# Patient Record
Sex: Male | Born: 1945 | Race: White | Hispanic: No | Marital: Married | Smoking: Current every day smoker
Health system: Southern US, Community
[De-identification: ages and names within clinical notes are randomized; demographics above are authoritative.]

## PROBLEM LIST (undated history)

## (undated) DIAGNOSIS — N179 Acute kidney failure, unspecified: Secondary | ICD-10-CM

## (undated) DIAGNOSIS — L570 Actinic keratosis: Secondary | ICD-10-CM

## (undated) DIAGNOSIS — Z8616 Personal history of COVID-19: Secondary | ICD-10-CM

## (undated) DIAGNOSIS — R7303 Prediabetes: Secondary | ICD-10-CM

## (undated) DIAGNOSIS — J449 Chronic obstructive pulmonary disease, unspecified: Secondary | ICD-10-CM

## (undated) DIAGNOSIS — F0390 Unspecified dementia without behavioral disturbance: Secondary | ICD-10-CM

## (undated) DIAGNOSIS — M5030 Other cervical disc degeneration, unspecified cervical region: Secondary | ICD-10-CM

## (undated) DIAGNOSIS — I10 Essential (primary) hypertension: Secondary | ICD-10-CM

## (undated) DIAGNOSIS — F32A Depression, unspecified: Secondary | ICD-10-CM

## (undated) DIAGNOSIS — F329 Major depressive disorder, single episode, unspecified: Secondary | ICD-10-CM

## (undated) DIAGNOSIS — M1990 Unspecified osteoarthritis, unspecified site: Secondary | ICD-10-CM

## (undated) HISTORY — DX: Acute kidney failure, unspecified: N17.9

## (undated) HISTORY — PX: TOE AMPUTATION: SHX809

## (undated) HISTORY — DX: Personal history of COVID-19: Z86.16

## (undated) HISTORY — PX: BACK SURGERY: SHX140

## (undated) HISTORY — DX: Actinic keratosis: L57.0

## (undated) HISTORY — DX: Unspecified dementia, unspecified severity, without behavioral disturbance, psychotic disturbance, mood disturbance, and anxiety: F03.90

---

## 1998-08-26 ENCOUNTER — Encounter: Payer: Self-pay | Admitting: Neurosurgery

## 1998-08-26 ENCOUNTER — Ambulatory Visit (HOSPITAL_COMMUNITY): Admission: RE | Admit: 1998-08-26 | Discharge: 1998-08-26 | Payer: Self-pay | Admitting: Neurosurgery

## 1998-09-09 ENCOUNTER — Encounter: Payer: Self-pay | Admitting: Neurosurgery

## 1998-09-09 ENCOUNTER — Ambulatory Visit (HOSPITAL_COMMUNITY): Admission: RE | Admit: 1998-09-09 | Discharge: 1998-09-09 | Payer: Self-pay | Admitting: Neurosurgery

## 1998-09-23 ENCOUNTER — Ambulatory Visit (HOSPITAL_COMMUNITY): Admission: RE | Admit: 1998-09-23 | Discharge: 1998-09-23 | Payer: Self-pay | Admitting: Neurosurgery

## 1998-09-23 ENCOUNTER — Encounter: Payer: Self-pay | Admitting: Neurosurgery

## 1998-10-29 ENCOUNTER — Ambulatory Visit (HOSPITAL_COMMUNITY): Admission: RE | Admit: 1998-10-29 | Discharge: 1998-10-29 | Payer: Self-pay | Admitting: Neurosurgery

## 1998-10-29 ENCOUNTER — Encounter: Payer: Self-pay | Admitting: Neurosurgery

## 2004-03-26 ENCOUNTER — Ambulatory Visit: Payer: Self-pay | Admitting: Physician Assistant

## 2004-04-14 ENCOUNTER — Ambulatory Visit: Payer: Self-pay | Admitting: Pain Medicine

## 2004-05-27 ENCOUNTER — Ambulatory Visit: Payer: Self-pay | Admitting: Physician Assistant

## 2004-06-19 ENCOUNTER — Ambulatory Visit: Payer: Self-pay | Admitting: Pain Medicine

## 2004-07-03 ENCOUNTER — Ambulatory Visit: Payer: Self-pay | Admitting: Physician Assistant

## 2004-07-28 ENCOUNTER — Ambulatory Visit: Payer: Self-pay | Admitting: Physician Assistant

## 2004-08-26 ENCOUNTER — Ambulatory Visit: Payer: Self-pay | Admitting: Physician Assistant

## 2004-09-30 ENCOUNTER — Ambulatory Visit: Payer: Self-pay | Admitting: Physician Assistant

## 2004-10-07 ENCOUNTER — Ambulatory Visit: Payer: Self-pay | Admitting: Pain Medicine

## 2004-11-10 ENCOUNTER — Ambulatory Visit: Payer: Self-pay | Admitting: Physician Assistant

## 2004-11-27 ENCOUNTER — Ambulatory Visit: Payer: Self-pay | Admitting: Pain Medicine

## 2004-12-18 ENCOUNTER — Ambulatory Visit: Payer: Self-pay | Admitting: Physician Assistant

## 2004-12-24 ENCOUNTER — Ambulatory Visit: Payer: Self-pay | Admitting: Family Medicine

## 2004-12-30 ENCOUNTER — Ambulatory Visit: Payer: Self-pay | Admitting: Gastroenterology

## 2005-01-27 ENCOUNTER — Ambulatory Visit: Payer: Self-pay | Admitting: Physician Assistant

## 2005-02-17 ENCOUNTER — Ambulatory Visit: Payer: Self-pay | Admitting: Pain Medicine

## 2005-03-05 ENCOUNTER — Ambulatory Visit: Payer: Self-pay | Admitting: Physician Assistant

## 2005-04-13 ENCOUNTER — Ambulatory Visit: Payer: Self-pay | Admitting: Physician Assistant

## 2005-05-21 ENCOUNTER — Ambulatory Visit
Admit: 2005-05-21 | Disposition: A | Discharge status: Home / Self Care | Payer: Self-pay | Admitting: Physician Assistant

## 2005-06-11 ENCOUNTER — Ambulatory Visit: Payer: Self-pay | Admitting: Physician Assistant

## 2005-07-23 ENCOUNTER — Ambulatory Visit: Payer: Self-pay | Admitting: Physician Assistant

## 2005-09-02 ENCOUNTER — Ambulatory Visit: Payer: Self-pay | Admitting: Physician Assistant

## 2005-09-23 ENCOUNTER — Ambulatory Visit: Payer: Self-pay | Admitting: Pain Medicine

## 2005-10-06 ENCOUNTER — Ambulatory Visit: Payer: Self-pay | Admitting: Pain Medicine

## 2005-11-12 ENCOUNTER — Ambulatory Visit: Payer: Self-pay | Admitting: Physician Assistant

## 2005-11-30 ENCOUNTER — Ambulatory Visit: Payer: Self-pay | Admitting: Physician Assistant

## 2005-12-28 ENCOUNTER — Ambulatory Visit: Payer: Self-pay | Admitting: Physician Assistant

## 2006-01-06 ENCOUNTER — Ambulatory Visit: Payer: Self-pay | Admitting: Pain Medicine

## 2006-01-07 ENCOUNTER — Ambulatory Visit: Payer: Self-pay | Admitting: Pain Medicine

## 2006-01-27 ENCOUNTER — Ambulatory Visit: Payer: Self-pay | Admitting: Physician Assistant

## 2006-02-25 ENCOUNTER — Ambulatory Visit: Payer: Self-pay | Admitting: Physician Assistant

## 2006-03-25 ENCOUNTER — Ambulatory Visit: Payer: Self-pay | Admitting: Physician Assistant

## 2006-04-21 ENCOUNTER — Ambulatory Visit: Payer: Self-pay | Admitting: Physician Assistant

## 2006-05-21 ENCOUNTER — Ambulatory Visit: Payer: Self-pay | Admitting: Physician Assistant

## 2006-06-04 ENCOUNTER — Ambulatory Visit: Payer: Self-pay | Admitting: Pain Medicine

## 2006-06-14 ENCOUNTER — Ambulatory Visit: Payer: Self-pay | Admitting: Pain Medicine

## 2006-06-21 ENCOUNTER — Ambulatory Visit: Payer: Self-pay | Admitting: Physician Assistant

## 2006-07-14 ENCOUNTER — Ambulatory Visit: Payer: Self-pay | Admitting: Physician Assistant

## 2006-07-20 ENCOUNTER — Ambulatory Visit: Payer: Self-pay | Admitting: Pain Medicine

## 2006-07-29 ENCOUNTER — Ambulatory Visit: Payer: Self-pay | Admitting: Physician Assistant

## 2006-10-04 ENCOUNTER — Ambulatory Visit: Payer: Self-pay | Admitting: Physician Assistant

## 2008-05-21 ENCOUNTER — Ambulatory Visit: Payer: Self-pay | Admitting: Unknown Physician Specialty

## 2009-04-20 ENCOUNTER — Emergency Department: Payer: Self-pay | Admitting: Unknown Physician Specialty

## 2010-10-14 ENCOUNTER — Ambulatory Visit: Payer: Self-pay | Admitting: Endocrinology

## 2014-05-06 ENCOUNTER — Emergency Department: Payer: Self-pay | Admitting: Emergency Medicine

## 2014-05-06 LAB — BASIC METABOLIC PANEL
Anion Gap: 6 — ABNORMAL LOW (ref 7–16)
BUN: 10 mg/dL (ref 7–18)
Calcium, Total: 8.6 mg/dL (ref 8.5–10.1)
Chloride: 103 mmol/L (ref 98–107)
Co2: 28 mmol/L (ref 21–32)
Creatinine: 1.04 mg/dL (ref 0.60–1.30)
EGFR (African American): >60
EGFR (Non-African Amer.): >60
Glucose: 121 mg/dL — ABNORMAL HIGH (ref 65–99)
Osmolality: 274 (ref 275–301)
Potassium: 4.1 mmol/L (ref 3.5–5.1)
Sodium: 137 mmol/L (ref 136–145)

## 2014-05-06 LAB — CBC
HCT: 47 % (ref 40.0–52.0)
HGB: 15.3 g/dL (ref 13.0–18.0)
MCH: 31.6 pg (ref 26.0–34.0)
MCHC: 32.6 g/dL (ref 32.0–36.0)
MCV: 97 fL (ref 80–100)
Platelet: 191 10*3/uL (ref 150–440)
RBC: 4.86 10*6/uL (ref 4.40–5.90)
RDW: 13.3 % (ref 11.5–14.5)
WBC: 18 10*3/uL — ABNORMAL HIGH (ref 3.8–10.6)

## 2014-05-06 LAB — TROPONIN I
Troponin-I: <0.02 ng/mL
Troponin-I: <0.02 ng/mL

## 2015-06-25 DIAGNOSIS — E119 Type 2 diabetes mellitus without complications: Secondary | ICD-10-CM | POA: Diagnosis not present

## 2015-06-25 DIAGNOSIS — K224 Dyskinesia of esophagus: Secondary | ICD-10-CM | POA: Diagnosis not present

## 2015-06-25 DIAGNOSIS — G609 Hereditary and idiopathic neuropathy, unspecified: Secondary | ICD-10-CM | POA: Diagnosis not present

## 2015-07-02 DIAGNOSIS — K224 Dyskinesia of esophagus: Secondary | ICD-10-CM | POA: Diagnosis not present

## 2015-07-02 DIAGNOSIS — G4701 Insomnia due to medical condition: Secondary | ICD-10-CM | POA: Diagnosis not present

## 2015-07-02 DIAGNOSIS — M818 Other osteoporosis without current pathological fracture: Secondary | ICD-10-CM | POA: Diagnosis not present

## 2015-07-02 DIAGNOSIS — E785 Hyperlipidemia, unspecified: Secondary | ICD-10-CM | POA: Diagnosis not present

## 2015-07-02 DIAGNOSIS — F17210 Nicotine dependence, cigarettes, uncomplicated: Secondary | ICD-10-CM | POA: Diagnosis not present

## 2015-07-02 DIAGNOSIS — I10 Essential (primary) hypertension: Secondary | ICD-10-CM | POA: Diagnosis not present

## 2015-07-02 DIAGNOSIS — G609 Hereditary and idiopathic neuropathy, unspecified: Secondary | ICD-10-CM | POA: Diagnosis not present

## 2015-07-02 DIAGNOSIS — J449 Chronic obstructive pulmonary disease, unspecified: Secondary | ICD-10-CM | POA: Diagnosis not present

## 2015-07-02 DIAGNOSIS — B351 Tinea unguium: Secondary | ICD-10-CM | POA: Diagnosis not present

## 2015-07-02 DIAGNOSIS — E749 Disorder of carbohydrate metabolism, unspecified: Secondary | ICD-10-CM | POA: Diagnosis not present

## 2015-07-02 DIAGNOSIS — E119 Type 2 diabetes mellitus without complications: Secondary | ICD-10-CM | POA: Diagnosis not present

## 2015-07-02 DIAGNOSIS — F4322 Adjustment disorder with anxiety: Secondary | ICD-10-CM | POA: Diagnosis not present

## 2015-09-25 DIAGNOSIS — E1165 Type 2 diabetes mellitus with hyperglycemia: Secondary | ICD-10-CM | POA: Diagnosis not present

## 2015-09-25 DIAGNOSIS — R079 Chest pain, unspecified: Secondary | ICD-10-CM | POA: Diagnosis not present

## 2015-09-25 DIAGNOSIS — I10 Essential (primary) hypertension: Secondary | ICD-10-CM | POA: Diagnosis not present

## 2015-09-25 DIAGNOSIS — K224 Dyskinesia of esophagus: Secondary | ICD-10-CM | POA: Diagnosis not present

## 2015-09-25 DIAGNOSIS — G609 Hereditary and idiopathic neuropathy, unspecified: Secondary | ICD-10-CM | POA: Diagnosis not present

## 2015-09-25 DIAGNOSIS — F4322 Adjustment disorder with anxiety: Secondary | ICD-10-CM | POA: Diagnosis not present

## 2015-09-25 DIAGNOSIS — E119 Type 2 diabetes mellitus without complications: Secondary | ICD-10-CM | POA: Diagnosis not present

## 2015-09-25 DIAGNOSIS — E785 Hyperlipidemia, unspecified: Secondary | ICD-10-CM | POA: Diagnosis not present

## 2015-09-25 DIAGNOSIS — F17210 Nicotine dependence, cigarettes, uncomplicated: Secondary | ICD-10-CM | POA: Diagnosis not present

## 2015-09-25 DIAGNOSIS — G4701 Insomnia due to medical condition: Secondary | ICD-10-CM | POA: Diagnosis not present

## 2015-09-25 DIAGNOSIS — B351 Tinea unguium: Secondary | ICD-10-CM | POA: Diagnosis not present

## 2015-09-25 DIAGNOSIS — J449 Chronic obstructive pulmonary disease, unspecified: Secondary | ICD-10-CM | POA: Diagnosis not present

## 2015-12-27 DIAGNOSIS — E1165 Type 2 diabetes mellitus with hyperglycemia: Secondary | ICD-10-CM | POA: Diagnosis not present

## 2015-12-27 DIAGNOSIS — Z125 Encounter for screening for malignant neoplasm of prostate: Secondary | ICD-10-CM | POA: Diagnosis not present

## 2015-12-27 DIAGNOSIS — K224 Dyskinesia of esophagus: Secondary | ICD-10-CM | POA: Diagnosis not present

## 2015-12-27 DIAGNOSIS — M818 Other osteoporosis without current pathological fracture: Secondary | ICD-10-CM | POA: Diagnosis not present

## 2015-12-27 DIAGNOSIS — E785 Hyperlipidemia, unspecified: Secondary | ICD-10-CM | POA: Diagnosis not present

## 2015-12-31 DIAGNOSIS — G4701 Insomnia due to medical condition: Secondary | ICD-10-CM | POA: Diagnosis not present

## 2015-12-31 DIAGNOSIS — E1165 Type 2 diabetes mellitus with hyperglycemia: Secondary | ICD-10-CM | POA: Diagnosis not present

## 2015-12-31 DIAGNOSIS — M542 Cervicalgia: Secondary | ICD-10-CM | POA: Diagnosis not present

## 2015-12-31 DIAGNOSIS — M50121 Cervical disc disorder at C4-C5 level with radiculopathy: Secondary | ICD-10-CM | POA: Diagnosis not present

## 2015-12-31 DIAGNOSIS — E785 Hyperlipidemia, unspecified: Secondary | ICD-10-CM | POA: Diagnosis not present

## 2015-12-31 DIAGNOSIS — K224 Dyskinesia of esophagus: Secondary | ICD-10-CM | POA: Diagnosis not present

## 2015-12-31 DIAGNOSIS — M5012 Cervical disc disorder with radiculopathy, mid-cervical region: Secondary | ICD-10-CM | POA: Diagnosis not present

## 2015-12-31 DIAGNOSIS — M818 Other osteoporosis without current pathological fracture: Secondary | ICD-10-CM | POA: Diagnosis not present

## 2015-12-31 DIAGNOSIS — F17210 Nicotine dependence, cigarettes, uncomplicated: Secondary | ICD-10-CM | POA: Diagnosis not present

## 2015-12-31 DIAGNOSIS — J449 Chronic obstructive pulmonary disease, unspecified: Secondary | ICD-10-CM | POA: Diagnosis not present

## 2015-12-31 DIAGNOSIS — I10 Essential (primary) hypertension: Secondary | ICD-10-CM | POA: Diagnosis not present

## 2016-01-14 DIAGNOSIS — B351 Tinea unguium: Secondary | ICD-10-CM | POA: Diagnosis not present

## 2016-01-14 DIAGNOSIS — Z1211 Encounter for screening for malignant neoplasm of colon: Secondary | ICD-10-CM | POA: Diagnosis not present

## 2016-01-14 DIAGNOSIS — G609 Hereditary and idiopathic neuropathy, unspecified: Secondary | ICD-10-CM | POA: Diagnosis not present

## 2016-01-14 DIAGNOSIS — K224 Dyskinesia of esophagus: Secondary | ICD-10-CM | POA: Diagnosis not present

## 2016-01-14 DIAGNOSIS — F17210 Nicotine dependence, cigarettes, uncomplicated: Secondary | ICD-10-CM | POA: Diagnosis not present

## 2016-01-14 DIAGNOSIS — Z125 Encounter for screening for malignant neoplasm of prostate: Secondary | ICD-10-CM | POA: Diagnosis not present

## 2016-01-14 DIAGNOSIS — M818 Other osteoporosis without current pathological fracture: Secondary | ICD-10-CM | POA: Diagnosis not present

## 2016-01-14 DIAGNOSIS — M50121 Cervical disc disorder at C4-C5 level with radiculopathy: Secondary | ICD-10-CM | POA: Diagnosis not present

## 2016-01-14 DIAGNOSIS — E1165 Type 2 diabetes mellitus with hyperglycemia: Secondary | ICD-10-CM | POA: Diagnosis not present

## 2016-01-14 DIAGNOSIS — I10 Essential (primary) hypertension: Secondary | ICD-10-CM | POA: Diagnosis not present

## 2016-01-14 DIAGNOSIS — Z0000 Encounter for general adult medical examination without abnormal findings: Secondary | ICD-10-CM | POA: Diagnosis not present

## 2016-01-14 DIAGNOSIS — Z23 Encounter for immunization: Secondary | ICD-10-CM | POA: Diagnosis not present

## 2016-01-16 DIAGNOSIS — I10 Essential (primary) hypertension: Secondary | ICD-10-CM | POA: Diagnosis not present

## 2016-01-16 DIAGNOSIS — E785 Hyperlipidemia, unspecified: Secondary | ICD-10-CM | POA: Diagnosis not present

## 2016-01-16 DIAGNOSIS — B351 Tinea unguium: Secondary | ICD-10-CM | POA: Diagnosis not present

## 2016-01-16 DIAGNOSIS — F4322 Adjustment disorder with anxiety: Secondary | ICD-10-CM | POA: Diagnosis not present

## 2016-01-16 DIAGNOSIS — M818 Other osteoporosis without current pathological fracture: Secondary | ICD-10-CM | POA: Diagnosis not present

## 2016-01-16 DIAGNOSIS — K224 Dyskinesia of esophagus: Secondary | ICD-10-CM | POA: Diagnosis not present

## 2016-01-16 DIAGNOSIS — G609 Hereditary and idiopathic neuropathy, unspecified: Secondary | ICD-10-CM | POA: Diagnosis not present

## 2016-01-16 DIAGNOSIS — G4701 Insomnia due to medical condition: Secondary | ICD-10-CM | POA: Diagnosis not present

## 2016-01-16 DIAGNOSIS — F17210 Nicotine dependence, cigarettes, uncomplicated: Secondary | ICD-10-CM | POA: Diagnosis not present

## 2016-01-20 DIAGNOSIS — M9901 Segmental and somatic dysfunction of cervical region: Secondary | ICD-10-CM | POA: Diagnosis not present

## 2016-01-20 DIAGNOSIS — M9903 Segmental and somatic dysfunction of lumbar region: Secondary | ICD-10-CM | POA: Diagnosis not present

## 2016-01-20 DIAGNOSIS — M9902 Segmental and somatic dysfunction of thoracic region: Secondary | ICD-10-CM | POA: Diagnosis not present

## 2016-01-21 DIAGNOSIS — M9902 Segmental and somatic dysfunction of thoracic region: Secondary | ICD-10-CM | POA: Diagnosis not present

## 2016-01-21 DIAGNOSIS — M9903 Segmental and somatic dysfunction of lumbar region: Secondary | ICD-10-CM | POA: Diagnosis not present

## 2016-01-21 DIAGNOSIS — M9901 Segmental and somatic dysfunction of cervical region: Secondary | ICD-10-CM | POA: Diagnosis not present

## 2016-01-24 DIAGNOSIS — M9902 Segmental and somatic dysfunction of thoracic region: Secondary | ICD-10-CM | POA: Diagnosis not present

## 2016-01-24 DIAGNOSIS — M9901 Segmental and somatic dysfunction of cervical region: Secondary | ICD-10-CM | POA: Diagnosis not present

## 2016-01-24 DIAGNOSIS — M9903 Segmental and somatic dysfunction of lumbar region: Secondary | ICD-10-CM | POA: Diagnosis not present

## 2016-01-27 DIAGNOSIS — M9902 Segmental and somatic dysfunction of thoracic region: Secondary | ICD-10-CM | POA: Diagnosis not present

## 2016-01-27 DIAGNOSIS — M9903 Segmental and somatic dysfunction of lumbar region: Secondary | ICD-10-CM | POA: Diagnosis not present

## 2016-01-27 DIAGNOSIS — M9901 Segmental and somatic dysfunction of cervical region: Secondary | ICD-10-CM | POA: Diagnosis not present

## 2016-01-29 DIAGNOSIS — M9903 Segmental and somatic dysfunction of lumbar region: Secondary | ICD-10-CM | POA: Diagnosis not present

## 2016-01-29 DIAGNOSIS — M9901 Segmental and somatic dysfunction of cervical region: Secondary | ICD-10-CM | POA: Diagnosis not present

## 2016-01-29 DIAGNOSIS — M9902 Segmental and somatic dysfunction of thoracic region: Secondary | ICD-10-CM | POA: Diagnosis not present

## 2016-01-31 DIAGNOSIS — M9902 Segmental and somatic dysfunction of thoracic region: Secondary | ICD-10-CM | POA: Diagnosis not present

## 2016-01-31 DIAGNOSIS — M9901 Segmental and somatic dysfunction of cervical region: Secondary | ICD-10-CM | POA: Diagnosis not present

## 2016-01-31 DIAGNOSIS — M9903 Segmental and somatic dysfunction of lumbar region: Secondary | ICD-10-CM | POA: Diagnosis not present

## 2016-02-03 DIAGNOSIS — M9903 Segmental and somatic dysfunction of lumbar region: Secondary | ICD-10-CM | POA: Diagnosis not present

## 2016-02-03 DIAGNOSIS — M9901 Segmental and somatic dysfunction of cervical region: Secondary | ICD-10-CM | POA: Diagnosis not present

## 2016-02-03 DIAGNOSIS — M9902 Segmental and somatic dysfunction of thoracic region: Secondary | ICD-10-CM | POA: Diagnosis not present

## 2016-02-05 DIAGNOSIS — M9903 Segmental and somatic dysfunction of lumbar region: Secondary | ICD-10-CM | POA: Diagnosis not present

## 2016-02-05 DIAGNOSIS — M9901 Segmental and somatic dysfunction of cervical region: Secondary | ICD-10-CM | POA: Diagnosis not present

## 2016-02-05 DIAGNOSIS — M9902 Segmental and somatic dysfunction of thoracic region: Secondary | ICD-10-CM | POA: Diagnosis not present

## 2016-02-07 DIAGNOSIS — M9903 Segmental and somatic dysfunction of lumbar region: Secondary | ICD-10-CM | POA: Diagnosis not present

## 2016-02-07 DIAGNOSIS — M9901 Segmental and somatic dysfunction of cervical region: Secondary | ICD-10-CM | POA: Diagnosis not present

## 2016-02-07 DIAGNOSIS — M9902 Segmental and somatic dysfunction of thoracic region: Secondary | ICD-10-CM | POA: Diagnosis not present

## 2016-02-12 DIAGNOSIS — M9903 Segmental and somatic dysfunction of lumbar region: Secondary | ICD-10-CM | POA: Diagnosis not present

## 2016-02-12 DIAGNOSIS — M9901 Segmental and somatic dysfunction of cervical region: Secondary | ICD-10-CM | POA: Diagnosis not present

## 2016-02-12 DIAGNOSIS — M9902 Segmental and somatic dysfunction of thoracic region: Secondary | ICD-10-CM | POA: Diagnosis not present

## 2016-02-14 DIAGNOSIS — M9902 Segmental and somatic dysfunction of thoracic region: Secondary | ICD-10-CM | POA: Diagnosis not present

## 2016-02-14 DIAGNOSIS — M9903 Segmental and somatic dysfunction of lumbar region: Secondary | ICD-10-CM | POA: Diagnosis not present

## 2016-02-14 DIAGNOSIS — M9901 Segmental and somatic dysfunction of cervical region: Secondary | ICD-10-CM | POA: Diagnosis not present

## 2016-03-31 DIAGNOSIS — G609 Hereditary and idiopathic neuropathy, unspecified: Secondary | ICD-10-CM | POA: Diagnosis not present

## 2016-03-31 DIAGNOSIS — I10 Essential (primary) hypertension: Secondary | ICD-10-CM | POA: Diagnosis not present

## 2016-03-31 DIAGNOSIS — K224 Dyskinesia of esophagus: Secondary | ICD-10-CM | POA: Diagnosis not present

## 2016-03-31 DIAGNOSIS — G4701 Insomnia due to medical condition: Secondary | ICD-10-CM | POA: Diagnosis not present

## 2016-03-31 DIAGNOSIS — E785 Hyperlipidemia, unspecified: Secondary | ICD-10-CM | POA: Diagnosis not present

## 2016-03-31 DIAGNOSIS — F17210 Nicotine dependence, cigarettes, uncomplicated: Secondary | ICD-10-CM | POA: Diagnosis not present

## 2016-03-31 DIAGNOSIS — M50121 Cervical disc disorder at C4-C5 level with radiculopathy: Secondary | ICD-10-CM | POA: Diagnosis not present

## 2016-03-31 DIAGNOSIS — F4322 Adjustment disorder with anxiety: Secondary | ICD-10-CM | POA: Diagnosis not present

## 2016-03-31 DIAGNOSIS — J449 Chronic obstructive pulmonary disease, unspecified: Secondary | ICD-10-CM | POA: Diagnosis not present

## 2016-03-31 DIAGNOSIS — E1165 Type 2 diabetes mellitus with hyperglycemia: Secondary | ICD-10-CM | POA: Diagnosis not present

## 2016-03-31 DIAGNOSIS — B351 Tinea unguium: Secondary | ICD-10-CM | POA: Diagnosis not present

## 2016-03-31 DIAGNOSIS — M818 Other osteoporosis without current pathological fracture: Secondary | ICD-10-CM | POA: Diagnosis not present

## 2016-07-02 DIAGNOSIS — E1165 Type 2 diabetes mellitus with hyperglycemia: Secondary | ICD-10-CM | POA: Diagnosis not present

## 2016-07-02 DIAGNOSIS — M818 Other osteoporosis without current pathological fracture: Secondary | ICD-10-CM | POA: Diagnosis not present

## 2016-07-13 DIAGNOSIS — F4322 Adjustment disorder with anxiety: Secondary | ICD-10-CM | POA: Diagnosis not present

## 2016-07-13 DIAGNOSIS — M818 Other osteoporosis without current pathological fracture: Secondary | ICD-10-CM | POA: Diagnosis not present

## 2016-07-13 DIAGNOSIS — G609 Hereditary and idiopathic neuropathy, unspecified: Secondary | ICD-10-CM | POA: Diagnosis not present

## 2016-07-13 DIAGNOSIS — B351 Tinea unguium: Secondary | ICD-10-CM | POA: Diagnosis not present

## 2016-07-13 DIAGNOSIS — M50121 Cervical disc disorder at C4-C5 level with radiculopathy: Secondary | ICD-10-CM | POA: Diagnosis not present

## 2016-07-13 DIAGNOSIS — G4701 Insomnia due to medical condition: Secondary | ICD-10-CM | POA: Diagnosis not present

## 2016-07-13 DIAGNOSIS — J449 Chronic obstructive pulmonary disease, unspecified: Secondary | ICD-10-CM | POA: Diagnosis not present

## 2016-07-13 DIAGNOSIS — F17210 Nicotine dependence, cigarettes, uncomplicated: Secondary | ICD-10-CM | POA: Diagnosis not present

## 2016-07-13 DIAGNOSIS — E785 Hyperlipidemia, unspecified: Secondary | ICD-10-CM | POA: Diagnosis not present

## 2016-07-13 DIAGNOSIS — E1165 Type 2 diabetes mellitus with hyperglycemia: Secondary | ICD-10-CM | POA: Diagnosis not present

## 2016-07-13 DIAGNOSIS — I10 Essential (primary) hypertension: Secondary | ICD-10-CM | POA: Diagnosis not present

## 2016-08-06 ENCOUNTER — Ambulatory Visit: Payer: Self-pay | Admitting: Family Medicine

## 2016-10-09 DIAGNOSIS — M9901 Segmental and somatic dysfunction of cervical region: Secondary | ICD-10-CM | POA: Diagnosis not present

## 2016-10-09 DIAGNOSIS — M9902 Segmental and somatic dysfunction of thoracic region: Secondary | ICD-10-CM | POA: Diagnosis not present

## 2016-10-09 DIAGNOSIS — M9903 Segmental and somatic dysfunction of lumbar region: Secondary | ICD-10-CM | POA: Diagnosis not present

## 2016-10-22 DIAGNOSIS — H2512 Age-related nuclear cataract, left eye: Secondary | ICD-10-CM | POA: Diagnosis not present

## 2016-10-27 DIAGNOSIS — M25531 Pain in right wrist: Secondary | ICD-10-CM | POA: Diagnosis not present

## 2016-10-27 DIAGNOSIS — F17210 Nicotine dependence, cigarettes, uncomplicated: Secondary | ICD-10-CM | POA: Diagnosis not present

## 2016-10-27 DIAGNOSIS — F4322 Adjustment disorder with anxiety: Secondary | ICD-10-CM | POA: Diagnosis not present

## 2016-10-27 DIAGNOSIS — M818 Other osteoporosis without current pathological fracture: Secondary | ICD-10-CM | POA: Diagnosis not present

## 2016-10-27 DIAGNOSIS — M50121 Cervical disc disorder at C4-C5 level with radiculopathy: Secondary | ICD-10-CM | POA: Diagnosis not present

## 2016-10-27 DIAGNOSIS — I10 Essential (primary) hypertension: Secondary | ICD-10-CM | POA: Diagnosis not present

## 2016-10-27 DIAGNOSIS — G4701 Insomnia due to medical condition: Secondary | ICD-10-CM | POA: Diagnosis not present

## 2016-10-27 DIAGNOSIS — E785 Hyperlipidemia, unspecified: Secondary | ICD-10-CM | POA: Diagnosis not present

## 2016-10-27 DIAGNOSIS — E1165 Type 2 diabetes mellitus with hyperglycemia: Secondary | ICD-10-CM | POA: Diagnosis not present

## 2016-10-27 DIAGNOSIS — J449 Chronic obstructive pulmonary disease, unspecified: Secondary | ICD-10-CM | POA: Diagnosis not present

## 2016-10-27 DIAGNOSIS — R079 Chest pain, unspecified: Secondary | ICD-10-CM | POA: Diagnosis not present

## 2016-10-27 DIAGNOSIS — G609 Hereditary and idiopathic neuropathy, unspecified: Secondary | ICD-10-CM | POA: Diagnosis not present

## 2016-11-09 DIAGNOSIS — M9903 Segmental and somatic dysfunction of lumbar region: Secondary | ICD-10-CM | POA: Diagnosis not present

## 2016-11-09 DIAGNOSIS — M9902 Segmental and somatic dysfunction of thoracic region: Secondary | ICD-10-CM | POA: Diagnosis not present

## 2016-11-09 DIAGNOSIS — M9901 Segmental and somatic dysfunction of cervical region: Secondary | ICD-10-CM | POA: Diagnosis not present

## 2016-11-10 DIAGNOSIS — H2511 Age-related nuclear cataract, right eye: Secondary | ICD-10-CM | POA: Diagnosis not present

## 2016-11-11 ENCOUNTER — Encounter: Payer: Self-pay | Admitting: *Deleted

## 2016-11-11 DIAGNOSIS — M9901 Segmental and somatic dysfunction of cervical region: Secondary | ICD-10-CM | POA: Diagnosis not present

## 2016-11-11 DIAGNOSIS — M9903 Segmental and somatic dysfunction of lumbar region: Secondary | ICD-10-CM | POA: Diagnosis not present

## 2016-11-11 DIAGNOSIS — M9902 Segmental and somatic dysfunction of thoracic region: Secondary | ICD-10-CM | POA: Diagnosis not present

## 2016-11-17 ENCOUNTER — Ambulatory Visit
Admission: RE | Admit: 2016-11-17 | Discharge: 2016-11-17 | Disposition: A | Discharge status: Home / Self Care | Payer: PPO | Source: Ambulatory Visit | Attending: Ophthalmology | Admitting: Ophthalmology

## 2016-11-17 ENCOUNTER — Encounter: Payer: Self-pay | Admitting: *Deleted

## 2016-11-17 ENCOUNTER — Encounter
Admission: RE | Disposition: A | Discharge status: Home / Self Care | Payer: Self-pay | Source: Ambulatory Visit | Attending: Ophthalmology

## 2016-11-17 ENCOUNTER — Ambulatory Visit: Payer: PPO | Admitting: Anesthesiology

## 2016-11-17 DIAGNOSIS — F17200 Nicotine dependence, unspecified, uncomplicated: Secondary | ICD-10-CM | POA: Insufficient documentation

## 2016-11-17 DIAGNOSIS — I10 Essential (primary) hypertension: Secondary | ICD-10-CM | POA: Diagnosis not present

## 2016-11-17 DIAGNOSIS — H2511 Age-related nuclear cataract, right eye: Secondary | ICD-10-CM | POA: Insufficient documentation

## 2016-11-17 DIAGNOSIS — Z79899 Other long term (current) drug therapy: Secondary | ICD-10-CM | POA: Insufficient documentation

## 2016-11-17 DIAGNOSIS — J449 Chronic obstructive pulmonary disease, unspecified: Secondary | ICD-10-CM | POA: Insufficient documentation

## 2016-11-17 DIAGNOSIS — E7800 Pure hypercholesterolemia, unspecified: Secondary | ICD-10-CM | POA: Diagnosis not present

## 2016-11-17 DIAGNOSIS — F329 Major depressive disorder, single episode, unspecified: Secondary | ICD-10-CM | POA: Insufficient documentation

## 2016-11-17 HISTORY — DX: Depression, unspecified: F32.A

## 2016-11-17 HISTORY — DX: Other cervical disc degeneration, unspecified cervical region: M50.30

## 2016-11-17 HISTORY — DX: Chronic obstructive pulmonary disease, unspecified: J44.9

## 2016-11-17 HISTORY — DX: Essential (primary) hypertension: I10

## 2016-11-17 HISTORY — DX: Unspecified osteoarthritis, unspecified site: M19.90

## 2016-11-17 HISTORY — PX: CATARACT EXTRACTION W/PHACO: SHX586

## 2016-11-17 HISTORY — DX: Major depressive disorder, single episode, unspecified: F32.9

## 2016-11-17 LAB — GLUCOSE, CAPILLARY: GLUCOSE-CAPILLARY: 100 mg/dL — AB (ref 65–99)

## 2016-11-17 SURGERY — PHACOEMULSIFICATION, CATARACT, WITH IOL INSERTION
Anesthesia: Monitor Anesthesia Care | Site: Eye | Laterality: Right | Wound class: Clean

## 2016-11-17 MED ORDER — SODIUM CHLORIDE 0.9 % IV SOLN
INTRAVENOUS | Status: DC
  Administered 2016-11-17: 09:00:00 via INTRAVENOUS

## 2016-11-17 MED ORDER — FENTANYL CITRATE (PF) 100 MCG/2ML IJ SOLN
INTRAMUSCULAR | Status: DC | PRN
  Administered 2016-11-17: 50 ug via INTRAVENOUS
  Administered 2016-11-17: 25 ug via INTRAVENOUS

## 2016-11-17 MED ORDER — MOXIFLOXACIN HCL 0.5 % OP SOLN
OPHTHALMIC | Status: AC
  Filled 2016-11-17: qty 3

## 2016-11-17 MED ORDER — ARMC OPHTHALMIC DILATING DROPS
OPHTHALMIC | Status: AC
  Administered 2016-11-17: 1 via OPHTHALMIC
  Filled 2016-11-17: qty 0.4

## 2016-11-17 MED ORDER — EPINEPHRINE PF 1 MG/ML IJ SOLN
INTRAMUSCULAR | Status: AC
  Filled 2016-11-17: qty 2

## 2016-11-17 MED ORDER — EPINEPHRINE PF 1 MG/ML IJ SOLN
INTRAOCULAR | Status: DC | PRN
  Administered 2016-11-17: 1 mL via OPHTHALMIC

## 2016-11-17 MED ORDER — ARMC OPHTHALMIC DILATING DROPS
1.0000 "application " | OPHTHALMIC | Status: AC
  Administered 2016-11-17 (×3): 1 via OPHTHALMIC

## 2016-11-17 MED ORDER — NA CHONDROIT SULF-NA HYALURON 40-17 MG/ML IO SOLN
INTRAOCULAR | Status: AC
  Filled 2016-11-17: qty 1

## 2016-11-17 MED ORDER — LIDOCAINE HCL (PF) 2 % IJ SOLN
INTRAMUSCULAR | Status: AC
  Filled 2016-11-17: qty 2

## 2016-11-17 MED ORDER — MOXIFLOXACIN HCL 0.5 % OP SOLN: 1.0000 [drp] | OPHTHALMIC | Status: DC | PRN

## 2016-11-17 MED ORDER — LIDOCAINE HCL (PF) 4 % IJ SOLN
INTRAOCULAR | Status: DC | PRN
  Administered 2016-11-17: 2 mL via OPHTHALMIC

## 2016-11-17 MED ORDER — MOXIFLOXACIN HCL 0.5 % OP SOLN
OPHTHALMIC | Status: DC | PRN
  Administered 2016-11-17: .2 mL via OPHTHALMIC

## 2016-11-17 MED ORDER — CARBACHOL 0.01 % IO SOLN
INTRAOCULAR | Status: DC | PRN
  Administered 2016-11-17: .5 mL via INTRAOCULAR

## 2016-11-17 MED ORDER — POVIDONE-IODINE 5 % OP SOLN
OPHTHALMIC | Status: AC
  Filled 2016-11-17: qty 30

## 2016-11-17 MED ORDER — FENTANYL CITRATE (PF) 100 MCG/2ML IJ SOLN
INTRAMUSCULAR | Status: AC
  Filled 2016-11-17: qty 2

## 2016-11-17 MED ORDER — POVIDONE-IODINE 5 % OP SOLN
OPHTHALMIC | Status: DC | PRN
  Administered 2016-11-17: 1 via OPHTHALMIC

## 2016-11-17 MED ORDER — NA CHONDROIT SULF-NA HYALURON 40-17 MG/ML IO SOLN
INTRAOCULAR | Status: DC | PRN
  Administered 2016-11-17: 1 mL via INTRAOCULAR

## 2016-11-17 SURGICAL SUPPLY — 14 items
GLOVE BIO SURGEON STRL SZ8 (GLOVE) ×3 IMPLANT
GLOVE BIOGEL M 6.5 STRL (GLOVE) ×3 IMPLANT
GLOVE SURG LX 8.0 MICRO (GLOVE) ×2
GLOVE SURG LX STRL 8.0 MICRO (GLOVE) ×1 IMPLANT
GOWN STRL REUS W/ TWL LRG LVL3 (GOWN DISPOSABLE) ×2 IMPLANT
GOWN STRL REUS W/TWL LRG LVL3 (GOWN DISPOSABLE) ×4
LENS IOL TECNIS ITEC 22.5 (Intraocular Lens) ×3 IMPLANT
PACK CATARACT (MISCELLANEOUS) ×3 IMPLANT
PACK CATARACT BRASINGTON LX (MISCELLANEOUS) ×3 IMPLANT
SOL BSS BAG (MISCELLANEOUS) ×3
SOLUTION BSS BAG (MISCELLANEOUS) ×1 IMPLANT
SYR 5ML LL (SYRINGE) ×3 IMPLANT
WATER STERILE IRR 250ML POUR (IV SOLUTION) ×3 IMPLANT
WIPE NON LINTING 3.25X3.25 (MISCELLANEOUS) ×3 IMPLANT

## 2016-11-17 NOTE — Anesthesia Preprocedure Evaluation (Addendum)
Anesthesia Evaluation  Patient identified by MRN, date of birth, ID band Patient awake    Reviewed: Allergy & Precautions, NPO status , Patient's Chart, lab work & pertinent test results  History of Anesthesia Complications Negative for: history of anesthetic complications  Airway Mallampati: II       Dental   Pulmonary COPD, Current Smoker,           Cardiovascular hypertension, Pt. on medications and Pt. on home beta blockers      Neuro/Psych Depression    GI/Hepatic negative GI ROS, Neg liver ROS,   Endo/Other  negative endocrine ROS  Renal/GU negative Renal ROS     Musculoskeletal   Abdominal   Peds  Hematology negative hematology ROS (+)   Anesthesia Other Findings   Reproductive/Obstetrics                            Anesthesia Physical Anesthesia Plan  ASA: III  Anesthesia Plan: MAC   Post-op Pain Management:    Induction: Intravenous  PONV Risk Score and Plan: 1 and Ondansetron and Dexamethasone  Airway Management Planned:   Additional Equipment:   Intra-op Plan:   Post-operative Plan:   Informed Consent: I have reviewed the patients History and Physical, chart, labs and discussed the procedure including the risks, benefits and alternatives for the proposed anesthesia with the patient or authorized representative who has indicated his/her understanding and acceptance.     Plan Discussed with:   Anesthesia Plan Comments:         Anesthesia Quick Evaluation

## 2016-11-17 NOTE — Anesthesia Postprocedure Evaluation (Signed)
Anesthesia Post Note  Patient: SUEO CULLEN  Procedure(s) Performed: Procedure(s) (LRB): CATARACT EXTRACTION PHACO AND INTRAOCULAR LENS PLACEMENT (IOC) (Right)  Patient location during evaluation: Short Stay Anesthesia Type: MAC Level of consciousness: awake, awake and alert and oriented Pain management: pain level controlled Vital Signs Assessment: post-procedure vital signs reviewed and stable Respiratory status: spontaneous breathing Cardiovascular status: stable Postop Assessment: no headache and adequate PO intake Anesthetic complications: no     Last Vitals:  Vitals:   11/17/16 0927 11/17/16 0928  BP: 140/81 (!) 144/93  Pulse: (!) 56   Resp: 20   Temp:      Last Pain:  Vitals:   11/17/16 0728  TempSrc: Burnett Corrente

## 2016-11-17 NOTE — Op Note (Signed)
PREOPERATIVE DIAGNOSIS:  Nuclear sclerotic cataract of the right eye.   POSTOPERATIVE DIAGNOSIS:  nuclear sclerotic cataract right eye   OPERATIVE PROCEDURE: Procedure(s): CATARACT EXTRACTION PHACO AND INTRAOCULAR LENS PLACEMENT (IOC)   SURGEON:  Birder Robson, MD.   ANESTHESIA:  Anesthesiologist: Gunnar Fusi, MD CRNA: Jonna Clark, CRNA  1.      Managed anesthesia care. 2.      0.50ml of Shugarcaine was instilled in the eye following the paracentesis.   COMPLICATIONS:  None.   TECHNIQUE:   Stop and chop   DESCRIPTION OF PROCEDURE:  The patient was examined and consented in the preoperative holding area where the aforementioned topical anesthesia was applied to the right eye and then brought back to the Operating Room where the right eye was prepped and draped in the usual sterile ophthalmic fashion and a lid speculum was placed. A paracentesis was created with the side port blade and the anterior chamber was filled with viscoelastic. A near clear corneal incision was performed with the steel keratome. A continuous curvilinear capsulorrhexis was performed with a cystotome followed by the capsulorrhexis forceps. Hydrodissection and hydrodelineation were carried out with BSS on a blunt cannula. The lens was removed in a stop and chop  technique and the remaining cortical material was removed with the irrigation-aspiration handpiece. The capsular bag was inflated with viscoelastic and the Technis ZCB00  lens was placed in the capsular bag without complication. The remaining viscoelastic was removed from the eye with the irrigation-aspiration handpiece. The wounds were hydrated. The anterior chamber was flushed with Miostat and the eye was inflated to physiologic pressure. 0.9ml of Vigamox was placed in the anterior chamber. The wounds were found to be water tight. The eye was dressed with Vigamox. The patient was given protective glasses to wear throughout the day and a shield with  which to sleep tonight. The patient was also given drops with which to begin a drop regimen today and will follow-up with me in one day.  Implant Name Type Inv. Item Serial No. Manufacturer Lot No. LRB No. Used  LENS IOL DIOP 22.5 - [id number] Intraocular Lens LENS IOL DIOP 22.5 [id number]   Right 1   Procedure(s) with comments: CATARACT EXTRACTION PHACO AND INTRAOCULAR LENS PLACEMENT (IOC) (Right) - [place] 00:37 AP% 12.6 CDE 4.69 Fluid pack lot # [id number]  Electronically signed: [place] 11/17/2016 9:21 AM

## 2016-11-17 NOTE — Discharge Instructions (Signed)
FOLLOW DR. PORFILIO'S POSTOP DISCHARGE INSTRUCTION SHEET AS REVIEWED.  Eye Surgery Discharge Instructions  Expect mild scratchy sensation or mild soreness. DO NOT RUB YOUR EYE!  The day of surgery:  Minimal physical activity, but bed rest is not required  No reading, computer work, or close hand work  No bending, lifting, or straining.  May watch TV  For 24 hours:  No driving, legal decisions, or alcoholic beverages  Safety precautions  Eat anything you prefer: It is better to start with liquids, then soup then solid foods.  _____ Eye patch should be worn until postoperative exam tomorrow.  ____ Solar shield eyeglasses should be worn for comfort in the sunlight/patch while sleeping  Resume all regular medications including aspirin or Coumadin if these were discontinued prior to surgery. You may shower, bathe, shave, or wash your hair. Tylenol may be taken for mild discomfort.  Call your doctor if you experience significant pain, nausea, or vomiting, fever > 101 or other signs of infection. [phone] or [phone] Specific instructions:  Follow-up Information    Birder Robson, MD Follow up.   Specialty:  Ophthalmology Why:  Wednesday 11/18/16 @ 08:50 am Contact information: [place] [address] [phone]

## 2016-11-17 NOTE — H&P (Signed)
All labs reviewed. Abnormal studies sent to patients PCP when indicated.  Previous H&P reviewed, patient examined, there are NO CHANGES.  Karl Roblero LOUIS6/12/20188:54 AM

## 2016-11-17 NOTE — Transfer of Care (Signed)
Immediate Anesthesia Transfer of Care Note  Patient: FELTON BUCZYNSKI  Procedure(s) Performed: Procedure(s) with comments: CATARACT EXTRACTION PHACO AND INTRAOCULAR LENS PLACEMENT (IOC) (Right) - [place] 00:37 AP% 12.6 CDE 4.69 Fluid pack lot # [id number]  Patient Location: PACU and Short Stay  Anesthesia Type:MAC  Level of Consciousness: awake, alert  and oriented  Airway & Oxygen Therapy: Patient Spontanous Breathing  Post-op Assessment: Report given to RN and Post -op Vital signs reviewed and stable  Post vital signs: Reviewed and stable  Last Vitals:  Vitals:   11/17/16 0728 11/17/16 0927  BP: (!) 153/84 140/81  Pulse: (!) 58 (!) 56  Resp: 16 20  Temp: 36.4 C     Last Pain:  Vitals:   11/17/16 0728  TempSrc: Oral         Complications: No apparent anesthesia complications

## 2016-11-17 NOTE — Anesthesia Post-op Follow-up Note (Cosign Needed)
Anesthesia QCDR form completed.        

## 2016-11-18 ENCOUNTER — Encounter: Payer: Self-pay | Admitting: Ophthalmology

## 2016-12-02 DIAGNOSIS — H2512 Age-related nuclear cataract, left eye: Secondary | ICD-10-CM | POA: Diagnosis not present

## 2016-12-03 ENCOUNTER — Encounter: Payer: Self-pay | Admitting: *Deleted

## 2016-12-08 ENCOUNTER — Ambulatory Visit: Payer: PPO | Admitting: Registered Nurse

## 2016-12-08 ENCOUNTER — Encounter: Payer: Self-pay | Admitting: *Deleted

## 2016-12-08 ENCOUNTER — Ambulatory Visit
Admission: RE | Admit: 2016-12-08 | Discharge: 2016-12-08 | Disposition: A | Discharge status: Home / Self Care | Payer: PPO | Source: Ambulatory Visit | Attending: Ophthalmology | Admitting: Ophthalmology

## 2016-12-08 ENCOUNTER — Encounter
Admission: RE | Disposition: A | Discharge status: Home / Self Care | Payer: Self-pay | Source: Ambulatory Visit | Attending: Ophthalmology

## 2016-12-08 DIAGNOSIS — J449 Chronic obstructive pulmonary disease, unspecified: Secondary | ICD-10-CM | POA: Diagnosis not present

## 2016-12-08 DIAGNOSIS — M1990 Unspecified osteoarthritis, unspecified site: Secondary | ICD-10-CM | POA: Diagnosis not present

## 2016-12-08 DIAGNOSIS — H2512 Age-related nuclear cataract, left eye: Secondary | ICD-10-CM | POA: Diagnosis not present

## 2016-12-08 DIAGNOSIS — M5030 Other cervical disc degeneration, unspecified cervical region: Secondary | ICD-10-CM | POA: Diagnosis not present

## 2016-12-08 DIAGNOSIS — R7303 Prediabetes: Secondary | ICD-10-CM | POA: Insufficient documentation

## 2016-12-08 DIAGNOSIS — Z888 Allergy status to other drugs, medicaments and biological substances status: Secondary | ICD-10-CM | POA: Diagnosis not present

## 2016-12-08 DIAGNOSIS — Z79899 Other long term (current) drug therapy: Secondary | ICD-10-CM | POA: Insufficient documentation

## 2016-12-08 DIAGNOSIS — E7800 Pure hypercholesterolemia, unspecified: Secondary | ICD-10-CM | POA: Diagnosis not present

## 2016-12-08 DIAGNOSIS — Z885 Allergy status to narcotic agent status: Secondary | ICD-10-CM | POA: Diagnosis not present

## 2016-12-08 DIAGNOSIS — F17200 Nicotine dependence, unspecified, uncomplicated: Secondary | ICD-10-CM | POA: Insufficient documentation

## 2016-12-08 DIAGNOSIS — F329 Major depressive disorder, single episode, unspecified: Secondary | ICD-10-CM | POA: Insufficient documentation

## 2016-12-08 DIAGNOSIS — I10 Essential (primary) hypertension: Secondary | ICD-10-CM | POA: Diagnosis not present

## 2016-12-08 HISTORY — DX: Prediabetes: R73.03

## 2016-12-08 HISTORY — PX: CATARACT EXTRACTION W/PHACO: SHX586

## 2016-12-08 LAB — GLUCOSE, CAPILLARY: Glucose-Capillary: 92 mg/dL (ref 65–99)

## 2016-12-08 SURGERY — PHACOEMULSIFICATION, CATARACT, WITH IOL INSERTION
Anesthesia: Monitor Anesthesia Care | Site: Eye | Laterality: Left | Wound class: Clean

## 2016-12-08 MED ORDER — ARMC OPHTHALMIC DILATING DROPS
OPHTHALMIC | Status: AC
  Administered 2016-12-08: 1 via OPHTHALMIC
  Filled 2016-12-08: qty 0.4

## 2016-12-08 MED ORDER — MIDAZOLAM HCL 2 MG/2ML IJ SOLN
INTRAMUSCULAR | Status: AC
Start: 2016-12-08 — End: 2016-12-08
  Filled 2016-12-08: qty 2

## 2016-12-08 MED ORDER — FENTANYL CITRATE (PF) 100 MCG/2ML IJ SOLN
INTRAMUSCULAR | Status: AC
  Filled 2016-12-08: qty 2

## 2016-12-08 MED ORDER — CARBACHOL 0.01 % IO SOLN
INTRAOCULAR | Status: DC | PRN
  Administered 2016-12-08: 0.5 mL via INTRAOCULAR

## 2016-12-08 MED ORDER — MOXIFLOXACIN HCL 0.5 % OP SOLN
OPHTHALMIC | Status: AC
  Filled 2016-12-08: qty 3

## 2016-12-08 MED ORDER — POVIDONE-IODINE 5 % OP SOLN
OPHTHALMIC | Status: DC | PRN
  Administered 2016-12-08: 1 via OPHTHALMIC

## 2016-12-08 MED ORDER — ARMC OPHTHALMIC DILATING DROPS
1.0000 "application " | OPHTHALMIC | Status: AC
  Administered 2016-12-08 (×3): 1 via OPHTHALMIC

## 2016-12-08 MED ORDER — MOXIFLOXACIN HCL 0.5 % OP SOLN: 1.0000 [drp] | OPHTHALMIC | Status: DC | PRN

## 2016-12-08 MED ORDER — MIDAZOLAM HCL 2 MG/2ML IJ SOLN
INTRAMUSCULAR | Status: DC | PRN
Start: 2016-12-08 — End: 2016-12-08
  Administered 2016-12-08: 1 mg via INTRAVENOUS

## 2016-12-08 MED ORDER — FENTANYL CITRATE (PF) 100 MCG/2ML IJ SOLN
INTRAMUSCULAR | Status: DC | PRN
  Administered 2016-12-08: 25 ug via INTRAVENOUS

## 2016-12-08 MED ORDER — LIDOCAINE HCL (PF) 4 % IJ SOLN
INTRAOCULAR | Status: DC | PRN
  Administered 2016-12-08: 2 mL via OPHTHALMIC

## 2016-12-08 MED ORDER — MOXIFLOXACIN HCL 0.5 % OP SOLN
OPHTHALMIC | Status: DC | PRN
  Administered 2016-12-08: .2 mL via OPHTHALMIC

## 2016-12-08 MED ORDER — NA CHONDROIT SULF-NA HYALURON 40-17 MG/ML IO SOLN
INTRAOCULAR | Status: DC | PRN
  Administered 2016-12-08: 1 mL via INTRAOCULAR

## 2016-12-08 MED ORDER — SODIUM CHLORIDE 0.9 % IV SOLN
INTRAVENOUS | Status: DC
  Administered 2016-12-08: 09:00:00 via INTRAVENOUS

## 2016-12-08 MED ORDER — EPINEPHRINE PF 1 MG/ML IJ SOLN
INTRAOCULAR | Status: DC | PRN
  Administered 2016-12-08: 1 mL via OPHTHALMIC

## 2016-12-08 SURGICAL SUPPLY — 14 items
GLOVE BIO SURGEON STRL SZ8 (GLOVE) ×3 IMPLANT
GLOVE BIOGEL M 6.5 STRL (GLOVE) ×3 IMPLANT
GLOVE SURG LX 8.0 MICRO (GLOVE) ×2
GLOVE SURG LX STRL 8.0 MICRO (GLOVE) ×1 IMPLANT
GOWN STRL REUS W/ TWL LRG LVL3 (GOWN DISPOSABLE) ×2 IMPLANT
GOWN STRL REUS W/TWL LRG LVL3 (GOWN DISPOSABLE) ×4
LENS IOL TECNIS ITEC 22.0 (Intraocular Lens) ×3 IMPLANT
PACK CATARACT (MISCELLANEOUS) ×3 IMPLANT
PACK CATARACT BRASINGTON LX (MISCELLANEOUS) ×3 IMPLANT
SOL BSS BAG (MISCELLANEOUS) ×3
SOLUTION BSS BAG (MISCELLANEOUS) ×1 IMPLANT
SYR 5ML LL (SYRINGE) ×3 IMPLANT
WATER STERILE IRR 250ML POUR (IV SOLUTION) ×3 IMPLANT
WIPE NON LINTING 3.25X3.25 (MISCELLANEOUS) ×3 IMPLANT

## 2016-12-08 NOTE — Discharge Instructions (Signed)
Eye Surgery Discharge Instructions  Expect mild scratchy sensation or mild soreness. DO NOT RUB YOUR EYE!  The day of surgery:  Minimal physical activity, but bed rest is not required  No reading, computer work, or close hand work  No bending, lifting, or straining.  May watch TV  For 24 hours:  No driving, legal decisions, or alcoholic beverages  Safety precautions  Eat anything you prefer: It is better to start with liquids, then soup then solid foods.  _____ Eye patch should be worn until postoperative exam tomorrow.  ____ Solar shield eyeglasses should be worn for comfort in the sunlight/patch while sleeping  Resume all regular medications including aspirin or Coumadin if these were discontinued prior to surgery. You may shower, bathe, shave, or wash your hair. Tylenol may be taken for mild discomfort.  Call your doctor if you experience significant pain, nausea, or vomiting, fever > 101 or other signs of infection. [phone] or [phone] Specific instructions:  Follow-up Information    Birder Robson, MD Follow up on 12/08/2016.   Specialty:  Ophthalmology Why:  3:50 Contact information: [address] [place] [address] [phone]

## 2016-12-08 NOTE — Transfer of Care (Signed)
Immediate Anesthesia Transfer of Care Note  Patient: Karl Myers  Procedure(s) Performed: Procedure(s) with comments: CATARACT EXTRACTION PHACO AND INTRAOCULAR LENS PLACEMENT (IOC) (Left) - [place] 00:33 AP% 13.9 CDE 4.63 Fluid pack lot # [id number]  Patient Location: PACU  Anesthesia Type:MAC  Level of Consciousness: awake, alert  and oriented  Airway & Oxygen Therapy: Patient Spontanous Breathing  Post-op Assessment: Report given to RN and Post -op Vital signs reviewed and stable  Post vital signs: Reviewed and stable  Last Vitals:  Vitals:   12/08/16 0838 12/08/16 1028  BP: 132/85 135/73  Pulse: (!) 58 (!) 51  Resp: 20 15  Temp: (!) 35.7 C 36.4 C    Last Pain:  Vitals:   12/08/16 1028  TempSrc: Oral  PainSc:          Complications: No apparent anesthesia complications

## 2016-12-08 NOTE — Anesthesia Postprocedure Evaluation (Signed)
Anesthesia Post Note  Patient: Karl Myers  Procedure(s) Performed: Procedure(s) (LRB): CATARACT EXTRACTION PHACO AND INTRAOCULAR LENS PLACEMENT (IOC) (Left)  Patient location during evaluation: PACU Anesthesia Type: MAC Level of consciousness: awake Pain management: pain level controlled Vital Signs Assessment: post-procedure vital signs reviewed and stable Respiratory status: spontaneous breathing Cardiovascular status: blood pressure returned to baseline Postop Assessment: no signs of nausea or vomiting Anesthetic complications: no     Last Vitals:  Vitals:   12/08/16 0838 12/08/16 1028  BP: 132/85 135/73  Pulse: (!) 58 (!) 51  Resp: 20 15  Temp: (!) 35.7 C 36.4 C    Last Pain:  Vitals:   12/08/16 1028  TempSrc: Oral  PainSc:                  Hedda Slade

## 2016-12-08 NOTE — Anesthesia Preprocedure Evaluation (Signed)
Anesthesia Evaluation  Patient identified by MRN, date of birth, ID band Patient awake    Reviewed: Allergy & Precautions, NPO status , Patient's Chart, lab work & pertinent test results  History of Anesthesia Complications Negative for: history of anesthetic complications  Airway Mallampati: II   Neck ROM: limited    Dental   Pulmonary COPD, Current Smoker,           Cardiovascular hypertension, Pt. on medications and Pt. on home beta blockers      Neuro/Psych PSYCHIATRIC DISORDERS Depression    GI/Hepatic negative GI ROS, Neg liver ROS,   Endo/Other  negative endocrine ROS  Renal/GU negative Renal ROS     Musculoskeletal  (+) Arthritis ,   Abdominal   Peds  Hematology negative hematology ROS (+)   Anesthesia Other Findings Past Medical History: No date: Arthritis No date: COPD (chronic obstructive pulmonary disease) (* No date: DDD (degenerative disc disease), cervical No date: DDD (degenerative disc disease), cervical No date: Depression No date: Hypertension No date: Pre-diabetes   Reproductive/Obstetrics                             Anesthesia Physical  Anesthesia Plan  ASA: III  Anesthesia Plan: MAC   Post-op Pain Management:    Induction: Intravenous  PONV Risk Score and Plan: 1 and Ondansetron and Dexamethasone  Airway Management Planned:   Additional Equipment:   Intra-op Plan:   Post-operative Plan:   Informed Consent: I have reviewed the patients History and Physical, chart, labs and discussed the procedure including the risks, benefits and alternatives for the proposed anesthesia with the patient or authorized representative who has indicated his/her understanding and acceptance.     Plan Discussed with:   Anesthesia Plan Comments:         Anesthesia Quick Evaluation

## 2016-12-08 NOTE — Op Note (Signed)
PREOPERATIVE DIAGNOSIS:  Nuclear sclerotic cataract of the left eye.   POSTOPERATIVE DIAGNOSIS:  Nuclear sclerotic cataract of the left eye.   OPERATIVE PROCEDURE: Procedure(s): CATARACT EXTRACTION PHACO AND INTRAOCULAR LENS PLACEMENT (IOC)   SURGEON:  Birder Robson, MD.   ANESTHESIA:  Anesthesiologist: Piscitello, Precious Haws, MD CRNA: Hedda Slade, CRNA  1.      Managed anesthesia care. 2.     0.25ml of Shugarcaine was instilled following the paracentesis   COMPLICATIONS:  None.   TECHNIQUE:   Stop and chop   DESCRIPTION OF PROCEDURE:  The patient was examined and consented in the preoperative holding area where the aforementioned topical anesthesia was applied to the left eye and then brought back to the Operating Room where the left eye was prepped and draped in the usual sterile ophthalmic fashion and a lid speculum was placed. A paracentesis was created with the side port blade and the anterior chamber was filled with viscoelastic. A near clear corneal incision was performed with the steel keratome. A continuous curvilinear capsulorrhexis was performed with a cystotome followed by the capsulorrhexis forceps. Hydrodissection and hydrodelineation were carried out with BSS on a blunt cannula. The lens was removed in a stop and chop  technique and the remaining cortical material was removed with the irrigation-aspiration handpiece. The capsular bag was inflated with viscoelastic and the Technis ZCB00 lens was placed in the capsular bag without complication. The remaining viscoelastic was removed from the eye with the irrigation-aspiration handpiece. The wounds were hydrated. The anterior chamber was flushed with Miostat and the eye was inflated to physiologic pressure. 0.72ml Vigamox was placed in the anterior chamber. The wounds were found to be water tight. The eye was dressed with Vigamox. The patient was given protective glasses to wear throughout the day and a shield with which to sleep  tonight. The patient was also given drops with which to begin a drop regimen today and will follow-up with me in one day.  Implant Name Type Inv. Item Serial No. Manufacturer Lot No. LRB No. Used  LENS IOL DIOP 22.0 - [id number] Intraocular Lens LENS IOL DIOP 22.0 [id number] AMO   Left 1    Procedure(s) with comments: CATARACT EXTRACTION PHACO AND INTRAOCULAR LENS PLACEMENT (IOC) (Left) - [place] 00:33 AP% 13.9 CDE 4.63 Fluid pack lot # [id number]  Electronically signed: Byrne Capek L[place] 12/08/2016 10:26 AM

## 2016-12-08 NOTE — H&P (Signed)
All labs reviewed. Abnormal studies sent to patients PCP when indicated.  Previous H&P reviewed, patient examined, there are NO CHANGES.  Frank Novelo LOUIS7/3/201810:03 AM

## 2016-12-08 NOTE — Anesthesia Post-op Follow-up Note (Cosign Needed)
Anesthesia QCDR form completed.        

## 2016-12-10 ENCOUNTER — Encounter: Payer: Self-pay | Admitting: Ophthalmology

## 2017-03-12 ENCOUNTER — Ambulatory Visit (INDEPENDENT_AMBULATORY_CARE_PROVIDER_SITE_OTHER): Payer: PPO | Admitting: Family Medicine

## 2017-03-12 ENCOUNTER — Encounter: Payer: Self-pay | Admitting: Family Medicine

## 2017-03-12 VITALS — BP 140/80 | HR 86 | Temp 98.4°F | Ht 67.0 in | Wt 163.4 lb

## 2017-03-12 DIAGNOSIS — Z8042 Family history of malignant neoplasm of prostate: Secondary | ICD-10-CM | POA: Diagnosis not present

## 2017-03-12 DIAGNOSIS — R5383 Other fatigue: Secondary | ICD-10-CM | POA: Insufficient documentation

## 2017-03-12 DIAGNOSIS — E785 Hyperlipidemia, unspecified: Secondary | ICD-10-CM | POA: Insufficient documentation

## 2017-03-12 DIAGNOSIS — M5412 Radiculopathy, cervical region: Secondary | ICD-10-CM | POA: Diagnosis not present

## 2017-03-12 DIAGNOSIS — G479 Sleep disorder, unspecified: Secondary | ICD-10-CM | POA: Insufficient documentation

## 2017-03-12 DIAGNOSIS — Z23 Encounter for immunization: Secondary | ICD-10-CM | POA: Diagnosis not present

## 2017-03-12 DIAGNOSIS — I10 Essential (primary) hypertension: Secondary | ICD-10-CM | POA: Diagnosis not present

## 2017-03-12 DIAGNOSIS — R059 Cough, unspecified: Secondary | ICD-10-CM

## 2017-03-12 DIAGNOSIS — R053 Chronic cough: Secondary | ICD-10-CM | POA: Insufficient documentation

## 2017-03-12 DIAGNOSIS — R5382 Chronic fatigue, unspecified: Secondary | ICD-10-CM | POA: Diagnosis not present

## 2017-03-12 DIAGNOSIS — R05 Cough: Secondary | ICD-10-CM

## 2017-03-12 MED ORDER — TRAZODONE HCL 150 MG PO TABS: 150.0000 mg | ORAL_TABLET | Freq: Every day | ORAL | 1 refills | Status: DC

## 2017-03-12 MED ORDER — LISINOPRIL 10 MG PO TABS: 10.0000 mg | ORAL_TABLET | Freq: Every day | ORAL | 3 refills | Status: DC

## 2017-03-12 MED ORDER — ATENOLOL 25 MG PO TABS: 25.0000 mg | ORAL_TABLET | Freq: Every day | ORAL | 3 refills | Status: DC

## 2017-03-12 MED ORDER — SIMVASTATIN 40 MG PO TABS: 40.0000 mg | ORAL_TABLET | Freq: Every day | ORAL | 3 refills | Status: DC

## 2017-03-12 MED ORDER — ALPRAZOLAM 0.5 MG PO TABS: 0.2500 mg | ORAL_TABLET | Freq: Two times a day (BID) | ORAL | 1 refills | Status: DC

## 2017-03-12 NOTE — Assessment & Plan Note (Addendum)
Patient with ulnar aspect cervical radiculopathy. Stable. He will monitor. If worsens or changes he will be evaluated again. We'll request records.

## 2017-03-12 NOTE — Progress Notes (Addendum)
Tommi Rumps, MD Phone: [phone]  Karl Myers is a 71 y.o. male who presents today for new patient visit.  Hypertension: Does not check his blood pressure. Taking atenolol and lisinopril. No chest pain or shortness of breath. No edema.  Patient takes Xanax for sleeping difficulty. Does have some anxiety and can't shut his mind off at night. He's been on Xanax nightly for years. No excessive drowsiness. No falls. No odd activities at night. Gets 5-7 hours of sleep.  Patient notes for a number of years he just doesn't have the energy that he use to. Notes it has been a gradual change. He'll take an afternoon nap. He can work in the yard for an hour before he gets tired. No chest pain or shortness of breath. He notes he had a stress test last year. States this was negative.  Notes bone spurs in his neck. 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 Both of those things have been stable. X-ray 2-3 years ago. Patient does not want any intervention or further evaluation for this.  Hyperlipidemia: Taking simvastatin. No myalgias or right upper quadrant pain.  Patient notes occasional cough in the morning right when he wakes up. No cough the rest of the day. Nonproductive. He smokes about 15 small cigars a day. He's smoked at least this much for the last 55 years. He does not want any lung cancer screening.  Active Ambulatory Problems    Diagnosis Date Noted  . Hypertension 03/12/2017  . Cough 03/12/2017  . Hyperlipidemia 03/12/2017  . Fatigue 03/12/2017  . Sleeping difficulty 03/12/2017  . Cervical radiculopathy 03/12/2017   Resolved Ambulatory Problems    Diagnosis Date Noted  . No Resolved Ambulatory Problems   Past Medical History:  Diagnosis Date  . Arthritis   . COPD (chronic obstructive pulmonary disease) ([name])   . DDD (degenerative disc disease), cervical   . DDD (degenerative disc disease), cervical   . Depression   . Hypertension   . Pre-diabetes     Family History    Problem Relation Age of Onset  . Heart attack Mother   . Prostate cancer Father   . Breast cancer Sister   . Cancer Brother        unknown what kind    Social History   Social History  . Marital status: Married    Spouse name: N/A  . Number of children: N/A  . Years of education: N/A   Occupational History  . Not on file.   Social History Main Topics  . Smoking status: Current Every Day Smoker    Packs/day: 1.00  . Smokeless tobacco: Never Used  . Alcohol use No  . Drug use: No  . Sexual activity: Not on file   Other Topics Concern  . Not on file   Social History Narrative  . No narrative on file    ROS  General:  Negative for nexplained weight loss, fever Skin: Negative for new or changing mole, sore that won't heal HEENT: Negative for trouble hearing, trouble seeing, ringing in ears, mouth sores, hoarseness, change in voice, dysphagia. CV:  Negative for chest pain, dyspnea, edema, palpitations Resp: Positive for cough, negative for dyspnea, hemoptysis GI: Negative for nausea, vomiting, diarrhea, constipation, abdominal pain, melena, hematochezia. GU: Negative for dysuria, incontinence, urinary hesitance, hematuria, vaginal or penile discharge, polyuria, sexual difficulty, lumps in testicle or breasts MSK: Negative for muscle cramps or aches, joint pain or swelling Neuro: Negative for headaches, weakness, numbness, dizziness, passing  out/fainting Psych: Positive for anxiety, Negative for depression, memory problems  Objective  Physical Exam Vitals:   03/12/17 1028  BP: 140/80  Pulse: 86  Temp: 98.4 F (36.9 C)  SpO2: 93%    BP Readings from Last 3 Encounters:  03/12/17 140/80  12/08/16 (!) 150/86  11/17/16 (!) 144/93   Wt Readings from Last 3 Encounters:  03/12/17 163 lb 6.4 oz (74.1 kg)  12/08/16 160 lb (72.6 kg)  11/17/16 160 lb (72.6 kg)    Physical Exam  Constitutional: No distress.  HENT:  Head: Normocephalic and atraumatic.   Mouth/Throat: Oropharynx is clear and moist. No oropharyngeal exudate.  Eyes: Pupils are equal, round, and reactive to light. Conjunctivae are normal.  Cardiovascular: Normal rate, regular rhythm and normal heart sounds.   Pulmonary/Chest: Effort normal and breath sounds normal.  Abdominal: Soft. Bowel sounds are normal. He exhibits no distension. There is no tenderness. There is no rebound and no guarding.  Musculoskeletal: He exhibits no edema.  Neurological: He is alert. Gait normal.  Skin: Skin is warm and dry. He is not diaphoretic.  Psychiatric: Mood and affect normal.   EKG: Normal sinus rhythm, rate 70, no ischemic changes  Assessment/Plan:   Hypertension Controlled for age. Continue current medications.  Cough Suspect related to smoking. Benign lung exam. Reports normal chest x-rays previously. We'll request prior records. He'll continue to monitor. Encouraged smoking cessation. Offered lung cancer screening though he declines.  Hyperlipidemia Continue simvastatin. Return for lab work.  Fatigue Chronic issue. Has not worsened. Reports stress test in the last year that was normal. EKG today reassuring. We'll check lab work. We'll request prior stress test results.  Sleeping difficulty Suspect anxiety related. He's been doing well on Xanax for a number of years. No adverse effects from this. Refill given.  Cervical radiculopathy Patient with ulnar aspect cervical radiculopathy. Stable. He will monitor. If worsens or changes he will be evaluated again. We'll request records.  Patient with family history of prostate cancer in his father. Check PSA with lab work. Orders Placed This Encounter  Procedures  . Flu vaccine HIGH DOSE PF  . HgB A1c    Standing Status:   Future    Standing Expiration Date:   03/12/2018  . CBC    Standing Status:   Future    Standing Expiration Date:   03/12/2018  . Comp Met (CMET)    Standing Status:   Future    Standing Expiration Date:    03/12/2018  . TSH    Standing Status:   Future    Standing Expiration Date:   03/12/2018  . Lipid panel    Standing Status:   Future    Standing Expiration Date:   03/12/2018  . PSA, Medicare    Standing Status:   Future    Standing Expiration Date:   03/12/2018  . Vitamin B12    Standing Status:   Future    Standing Expiration Date:   03/12/2018  . EKG 12-Lead    Meds ordered this encounter  Medications  . traZODone (DESYREL) 150 MG tablet    Sig: Take 1 tablet (150 mg total) by mouth at bedtime.    Dispense:  90 tablet    Refill:  1  . simvastatin (ZOCOR) 40 MG tablet    Sig: Take 1 tablet (40 mg total) by mouth at bedtime.    Dispense:  90 tablet    Refill:  3  . lisinopril (PRINIVIL,ZESTRIL) 10 MG tablet  Sig: Take 1 tablet (10 mg total) by mouth at bedtime.    Dispense:  90 tablet    Refill:  3  . atenolol (TENORMIN) 25 MG tablet    Sig: Take 1 tablet (25 mg total) by mouth at bedtime.    Dispense:  90 tablet    Refill:  3  . ALPRAZolam (XANAX) 0.5 MG tablet    Sig: Take 0.5-1 tablets (0.25-0.5 mg total) by mouth 2 (two) times daily. 0.25 mg in the morning & 0.5 mg at bedtime.    Dispense:  45 tablet    Refill:  [place], MD [place]

## 2017-03-12 NOTE — Patient Instructions (Signed)
Nice to see. We will have a return for lab work fasting sometime early next week. Please consider quitting smoking. If you develop chest pain or shortness of breath please be evaluated immediately.

## 2017-03-12 NOTE — Assessment & Plan Note (Signed)
Suspect anxiety related. He's been doing well on Xanax for a number of years. No adverse effects from this. Refill given.

## 2017-03-12 NOTE — Assessment & Plan Note (Signed)
Chronic issue. Has not worsened. Reports stress test in the last year that was normal. EKG today reassuring. We'll check lab work. We'll request prior stress test results.

## 2017-03-12 NOTE — Assessment & Plan Note (Signed)
Continue simvastatin. Return for lab work.

## 2017-03-12 NOTE — Assessment & Plan Note (Signed)
Controlled for age. Continue current medications.

## 2017-03-12 NOTE — Progress Notes (Signed)
Patient states he is ok with adding this lab

## 2017-03-12 NOTE — Assessment & Plan Note (Signed)
Suspect related to smoking. Benign lung exam. Reports normal chest x-rays previously. We'll request prior records. He'll continue to monitor. Encouraged smoking cessation. Offered lung cancer screening though he declines.

## 2017-03-23 ENCOUNTER — Other Ambulatory Visit (INDEPENDENT_AMBULATORY_CARE_PROVIDER_SITE_OTHER): Payer: PPO

## 2017-03-23 DIAGNOSIS — R5383 Other fatigue: Secondary | ICD-10-CM

## 2017-03-23 DIAGNOSIS — E785 Hyperlipidemia, unspecified: Secondary | ICD-10-CM

## 2017-03-23 DIAGNOSIS — Z8042 Family history of malignant neoplasm of prostate: Secondary | ICD-10-CM

## 2017-03-23 DIAGNOSIS — Z23 Encounter for immunization: Secondary | ICD-10-CM

## 2017-03-23 LAB — HEMOGLOBIN A1C: HEMOGLOBIN A1C: 5.9 % (ref 4.6–6.5)

## 2017-03-23 LAB — CBC
HEMATOCRIT: 49.4 % (ref 39.0–52.0)
HEMOGLOBIN: 16.8 g/dL (ref 13.0–17.0)
MCHC: 34 g/dL (ref 30.0–36.0)
MCV: 99.7 fl (ref 78.0–100.0)
Platelets: 221 10*3/uL (ref 150.0–400.0)
RBC: 4.96 Mil/uL (ref 4.22–5.81)
RDW: 14.3 % (ref 11.5–15.5)
WBC: 10.4 10*3/uL (ref 4.0–10.5)

## 2017-03-23 LAB — COMPREHENSIVE METABOLIC PANEL
ALT: 10 U/L (ref 0–53)
AST: 14 U/L (ref 0–37)
Albumin: 4.2 g/dL (ref 3.5–5.2)
Alkaline Phosphatase: 82 U/L (ref 39–117)
BUN: 15 mg/dL (ref 6–23)
CO2: 27 meq/L (ref 19–32)
Calcium: 9.4 mg/dL (ref 8.4–10.5)
Chloride: 106 mEq/L (ref 96–112)
Creatinine, Ser: 1.11 mg/dL (ref 0.40–1.50)
GFR: 69.3 mL/min (ref >60.00)
GLUCOSE: 109 mg/dL — AB (ref 70–99)
POTASSIUM: 4.4 meq/L (ref 3.5–5.1)
SODIUM: 141 meq/L (ref 135–145)
TOTAL PROTEIN: 6.6 g/dL (ref 6.0–8.3)
Total Bilirubin: 0.8 mg/dL (ref 0.2–1.2)

## 2017-03-23 LAB — TSH: TSH: 0.41 u[IU]/mL (ref 0.35–4.50)

## 2017-03-23 LAB — LIPID PANEL
CHOL/HDL RATIO: 3
Cholesterol: 141 mg/dL (ref 0–200)
HDL: 43.1 mg/dL (ref >39.00)
LDL CALC: 81 mg/dL (ref 0–99)
NONHDL: 97.8
TRIGLYCERIDES: 86 mg/dL (ref 0.0–149.0)
VLDL: 17.2 mg/dL (ref 0.0–40.0)

## 2017-03-23 LAB — PSA, MEDICARE: PSA: 0.66 ng/ml (ref 0.10–4.00)

## 2017-03-23 LAB — VITAMIN B12: Vitamin B-12: 326 pg/mL (ref 211–911)

## 2017-04-12 ENCOUNTER — Other Ambulatory Visit: Payer: Self-pay | Admitting: Family Medicine

## 2017-04-14 NOTE — Telephone Encounter (Signed)
This medication refilled by pharmacy and they are questing script for next fill for 05/12/17?

## 2017-04-14 NOTE — Telephone Encounter (Signed)
Please fax

## 2017-04-15 NOTE — Telephone Encounter (Signed)
Script faxed to [facility]

## 2017-06-09 ENCOUNTER — Other Ambulatory Visit: Payer: Self-pay | Admitting: Family Medicine

## 2017-06-11 NOTE — Telephone Encounter (Signed)
Last OV 03/12/17 last filled 03/12/17 90 1rf

## 2017-06-23 ENCOUNTER — Other Ambulatory Visit: Payer: Self-pay | Admitting: Family Medicine

## 2017-06-23 NOTE — Telephone Encounter (Signed)
Last OV 03/12/17 last filled 04/14/17 45 1rf

## 2017-06-23 NOTE — Telephone Encounter (Signed)
faxed

## 2017-06-24 ENCOUNTER — Other Ambulatory Visit: Payer: Self-pay

## 2017-06-24 ENCOUNTER — Encounter: Payer: Self-pay | Admitting: Family Medicine

## 2017-06-24 ENCOUNTER — Ambulatory Visit (INDEPENDENT_AMBULATORY_CARE_PROVIDER_SITE_OTHER): Payer: PPO | Admitting: Family Medicine

## 2017-06-24 VITALS — BP 142/80 | HR 64 | Temp 98.0°F | Wt 164.8 lb

## 2017-06-24 DIAGNOSIS — R5383 Other fatigue: Secondary | ICD-10-CM

## 2017-06-24 DIAGNOSIS — G8929 Other chronic pain: Secondary | ICD-10-CM

## 2017-06-24 DIAGNOSIS — I10 Essential (primary) hypertension: Secondary | ICD-10-CM | POA: Diagnosis not present

## 2017-06-24 DIAGNOSIS — G5621 Lesion of ulnar nerve, right upper limb: Secondary | ICD-10-CM

## 2017-06-24 DIAGNOSIS — M5412 Radiculopathy, cervical region: Secondary | ICD-10-CM | POA: Diagnosis not present

## 2017-06-24 DIAGNOSIS — Z720 Tobacco use: Secondary | ICD-10-CM

## 2017-06-24 DIAGNOSIS — M542 Cervicalgia: Secondary | ICD-10-CM | POA: Diagnosis not present

## 2017-06-24 DIAGNOSIS — G479 Sleep disorder, unspecified: Secondary | ICD-10-CM

## 2017-06-24 MED ORDER — VARENICLINE TARTRATE 0.5 MG X 11 & 1 MG X 42 PO MISC: ORAL | 0 refills | Status: DC

## 2017-06-24 NOTE — Patient Instructions (Signed)
Nice to see you. We will get you to see cardiology and orthopedics. Please pick a quit date for smoking and start the Chantix 1 week prior. If you develop chest pain, shortness of breath, new numbness, weakness, or any new or changing symptoms please seek medical attention.

## 2017-06-24 NOTE — Assessment & Plan Note (Addendum)
Related to anxiety.  Does well with trazodone and Xanax.  No excessive drowsiness.  He will continue to monitor.

## 2017-06-24 NOTE — Assessment & Plan Note (Signed)
Well-controlled at home.  He will monitor.  If it starts to elevate he will let us know.

## 2017-06-24 NOTE — Progress Notes (Signed)
Tommi Rumps, MD Phone: [phone]  Karl Myers is a 72 y.o. male who presents today for follow-up.  Hypertension: Occasionally checks it and it is typically around 098 systolically.  Taking atenolol and lisinopril.  No chest pain, shortness of breath, or edema.  He continues to have issues with some fatigue.  Particularly after chopping wood.  He can do it for about an hour and then feels quite fatigued.  He notes no snoring or apneic episodes.  He does wake well rested.  No coughing or wheezing.  He notes no chest pain or shortness of breath.  He reports a normal stress test sometime in the last year and a half or so.  Tobacco abuse: Smoking 1 pack/day for the last 55 years.  He is tried to quit cold [place] before with little benefit.  He has tried nicotine replacement with little benefit.  He is interested in Chantix.  Anxiety: Notes this is tolerable.  Does take Xanax and trazodone.  Patient continues to have issues with numbness in his right third through fifth fingers.  This has been going on for years.  He reports a prior x-ray that revealed arthritis and nerve impingement.  Notes some discomfort in his right ulnar groove as well that will cause pain to go down his hand and arm.  Some neck pain as well.  Social History   Tobacco Use  Smoking Status Current Every Day Smoker  . Packs/day: 1.00  Smokeless Tobacco Never Used     ROS see history of present illness  Objective  Physical Exam Vitals:   06/24/17 0952  BP: (!) 142/80  Pulse: 64  Temp: 98 F (36.7 C)  SpO2: 96%    BP Readings from Last 3 Encounters:  06/24/17 (!) 142/80  03/12/17 140/80  12/08/16 (!) 150/86   Wt Readings from Last 3 Encounters:  06/24/17 164 lb 12.8 oz (74.8 kg)  03/12/17 163 lb 6.4 oz (74.1 kg)  12/08/16 160 lb (72.6 kg)    Physical Exam  Constitutional: No distress.  Cardiovascular: Normal rate, regular rhythm and normal heart sounds.  Pulmonary/Chest: Effort normal and  breath sounds normal.  Musculoskeletal: He exhibits no edema.  No midline neck tenderness, no midline neck step-off, no muscular neck tenderness, positive Tinel's at the right ulnar groove, slight decreased sensation in third through fifth fingers though no other numbness in either upper extremity, 5/5 strength bilateral biceps, triceps, and grip, hands are warm and well-perfused  Neurological: He is alert. Gait normal.  Skin: Skin is warm and dry. He is not diaphoretic.     Assessment/Plan: Please see individual problem list.  Fatigue Chronic issue.  Lab work previously unremarkable.  Does not appear to be excessively sleepy.  Given that it occurs mostly after exertion we will have him reevaluated by cardiology.  We will request his stress test again.  This was not previously received.  Cervical radiculopathy Question whether or not this is coming from his neck or his elbow.  Will refer to orthopedic surgery.  Tobacco abuse Patient is interested in smoking cessation.  Discussed option of Chantix and Wellbutrin.  Discussed potential for worsening anxiety or depression with Chantix as well as abnormal dreams.  He wants to proceed with this.  If he has any worsening anxiety or or other side effects he will contact us.  Sleeping difficulty Related to anxiety.  Does well with trazodone and Xanax.  No excessive drowsiness.  He will continue to monitor.  Hypertension Well-controlled at  home.  He will monitor.  If it starts to elevate he will let us know.   Orders Placed This Encounter  Procedures  . Ambulatory referral to Cardiology    Referral Priority:   Routine    Referral Type:   Consultation    Referral Reason:   Specialty Services Required    Requested Specialty:   Cardiology    Number of Visits Requested:   1  . Ambulatory referral to Orthopedic Surgery    Referral Priority:   Routine    Referral Type:   Surgical    Referral Reason:   Specialty Services Required    Requested  Specialty:   Orthopedic Surgery    Number of Visits Requested:   1    Meds ordered this encounter  Medications  . varenicline (CHANTIX STARTING MONTH PAK) 0.5 MG X 11 & 1 MG X 42 tablet    Sig: Take one 0.5 mg tablet by mouth once daily for 3 days, then increase to one 0.5 mg tablet twice daily for 4 days, then increase to one 1 mg tablet twice daily.    Dispense:  53 tablet    Refill:  0     Tommi Rumps, MD [place]

## 2017-06-24 NOTE — Assessment & Plan Note (Signed)
Patient is interested in smoking cessation.  Discussed option of Chantix and Wellbutrin.  Discussed potential for worsening anxiety or depression with Chantix as well as abnormal dreams.  He wants to proceed with this.  If he has any worsening anxiety or or other side effects he will contact us.

## 2017-06-24 NOTE — Assessment & Plan Note (Addendum)
Chronic issue.  Lab work previously unremarkable.  Does not appear to be excessively sleepy.  Given that it occurs mostly after exertion we will have him reevaluated by cardiology.  We will request his stress test again.  This was not previously received.

## 2017-06-24 NOTE — Assessment & Plan Note (Signed)
Question whether or not this is coming from his neck or his elbow.  Will refer to orthopedic surgery.

## 2017-07-02 DIAGNOSIS — J069 Acute upper respiratory infection, unspecified: Secondary | ICD-10-CM | POA: Diagnosis not present

## 2017-07-03 ENCOUNTER — Ambulatory Visit: Payer: PPO | Admitting: Family Medicine

## 2017-07-04 DIAGNOSIS — J019 Acute sinusitis, unspecified: Secondary | ICD-10-CM | POA: Diagnosis not present

## 2017-07-12 DIAGNOSIS — M47816 Spondylosis without myelopathy or radiculopathy, lumbar region: Secondary | ICD-10-CM | POA: Diagnosis not present

## 2017-07-12 DIAGNOSIS — M4316 Spondylolisthesis, lumbar region: Secondary | ICD-10-CM | POA: Diagnosis not present

## 2017-07-12 DIAGNOSIS — M5136 Other intervertebral disc degeneration, lumbar region: Secondary | ICD-10-CM | POA: Diagnosis not present

## 2017-07-12 DIAGNOSIS — M166 Other bilateral secondary osteoarthritis of hip: Secondary | ICD-10-CM | POA: Diagnosis not present

## 2017-07-12 DIAGNOSIS — M5416 Radiculopathy, lumbar region: Secondary | ICD-10-CM | POA: Diagnosis not present

## 2017-07-12 DIAGNOSIS — M545 Low back pain: Secondary | ICD-10-CM | POA: Diagnosis not present

## 2017-07-21 DIAGNOSIS — H26493 Other secondary cataract, bilateral: Secondary | ICD-10-CM | POA: Diagnosis not present

## 2017-07-24 NOTE — Progress Notes (Signed)
Cardiology Office Note  Date:  07/27/2017   ID:  Karl Myers, DOB May 01, 1946, MRN [id number]  PCP:  Leone Haven, MD   Chief Complaint  Patient presents with  . other    Per Dr. Caryl Bis r/t fatigue. Pt concerned of high BP; denies cp, sob. Wife states shes concerned about his forgetfulness. Reviewed meds with pt verbally.    HPI:  Karl Myers is a 72 year old gentleman with past medical history of HTN Smoker, quit last month.  Smoked for 50 years or more Anxiety Coronary artery disease on previous CT scan Fatigue, with exertion, Who presents by referral from Dr. Caryl Bis for consultation of his fatigue, coronary artery disease  Reports that he is typically active in the summer  does gardening In the winter is less active, Has noticed more fatigue Not very active at baseline  Some shortness of breath with heavy exertion Unable to exclude chest tightness  Reports that he quit smoking last month, used Chantix  Previous lab work reviewed with him in detail Total chol 141, LD 81 HBA1C 5.9  EKG personally reviewed by myself on todays visit Shows normal sinus rhythm rate 61 bpm no significant ST or T wave changes   PMH:   has a past medical history of Arthritis, COPD (chronic obstructive pulmonary disease) ([place]), DDD (degenerative disc disease), cervical, DDD (degenerative disc disease), cervical, Depression, Hypertension, and Pre-diabetes.  PSH:    Past Surgical History:  Procedure Laterality Date  . BACK SURGERY     x 3  . CATARACT EXTRACTION W/PHACO Right 11/17/2016   Procedure: CATARACT EXTRACTION PHACO AND INTRAOCULAR LENS PLACEMENT (IOC);  Surgeon: Birder Robson, MD;  Location: [facility] ORS;  Service: Ophthalmology;  Laterality: Right;  [place] 00:37 AP% 12.6 CDE 4.69 Fluid pack lot # [id number]  . CATARACT EXTRACTION W/PHACO Left 12/08/2016   Procedure: CATARACT EXTRACTION PHACO AND INTRAOCULAR LENS PLACEMENT (IOC);  Surgeon: Birder Robson, MD;   Location: [facility] ORS;  Service: Ophthalmology;  Laterality: Left;  [place] 00:33 AP% 13.9 CDE 4.63 Fluid pack lot # [id number]    Current Outpatient Medications  Medication Sig Dispense Refill  . ALPRAZolam (XANAX) 0.5 MG tablet TAKE ONE-HALF TABLET BY MOUTH EVERY MORNING AND TAKE ONE TABLET AT BEDTIME AS DIRECTED 45 tablet 1  . atenolol (TENORMIN) 25 MG tablet Take 1 tablet (25 mg total) by mouth at bedtime. 90 tablet 3  . lisinopril (PRINIVIL,ZESTRIL) 10 MG tablet Take 1 tablet (10 mg total) by mouth at bedtime. 90 tablet 3  . nitroGLYCERIN (NITROSTAT) 0.4 MG SL tablet Place 0.4 mg under the tongue every 5 (five) minutes x 3 doses as needed. For chest pain.    . simvastatin (ZOCOR) 40 MG tablet Take 1 tablet (40 mg total) by mouth at bedtime. 90 tablet 3  . traZODone (DESYREL) 150 MG tablet TAKE 1 TABLET BY MOUTH AT BEDTIME 90 tablet 1  . varenicline (CHANTIX STARTING MONTH PAK) 0.5 MG X 11 & 1 MG X 42 tablet Take one 0.5 mg tablet by mouth once daily for 3 days, then increase to one 0.5 mg tablet twice daily for 4 days, then increase to one 1 mg tablet twice daily. 53 tablet 0   No current facility-administered medications for this visit.      Allergies:   Hydrocodone-acetaminophen and Neurontin [gabapentin]   Social History:  The patient  reports that he quit smoking about 4 weeks ago. He smoked 1.00 pack per day. he has never used smokeless tobacco. He reports  that he does not drink alcohol or use drugs.   Family History:   family history includes Breast cancer in his sister; Cancer in his brother; Heart attack in his mother; Prostate cancer in his father.    Review of Systems: Review of Systems  Constitutional: Negative.   Respiratory: Negative.   Cardiovascular: Negative.   Gastrointestinal: Negative.   Musculoskeletal: Negative.   Neurological: Negative.   Psychiatric/Behavioral: Negative.   All other systems reviewed and are negative.    PHYSICAL EXAM: VS:  BP 112/60 (BP  Location: Right Arm, Patient Position: Sitting, Cuff Size: Normal)   Pulse 61   Ht 5\' 7"  (1.702 m)   Wt 167 lb 12 oz (76.1 kg)   BMI 26.27 kg/m  , BMI Body mass index is 26.27 kg/m. GEN: Well nourished, well developed, in no acute distress  HEENT: normal  Neck: no JVD, carotid bruits, or masses Cardiac: RRR; no murmurs, rubs, or gallops,no edema  Respiratory: Moderately decreased breath sounds throughout , normal work of breathing GI: soft, nontender, nondistended, + BS MS: no deformity or atrophy  Skin: warm and dry, no rash Neuro:  Strength and sensation are intact Psych: euthymic mood, full affect    Recent Labs: 03/23/2017: ALT 10; BUN 15; Creatinine, Ser 1.11; Hemoglobin 16.8; Platelets 221.0; Potassium 4.4; Sodium 141; TSH 0.41    Lipid Panel Lab Results  Component Value Date   CHOL 141 03/23/2017   HDL 43.10 03/23/2017   LDLCALC 81 03/23/2017   TRIG 86.0 03/23/2017      Wt Readings from Last 3 Encounters:  07/27/17 167 lb 12 oz (76.1 kg)  06/24/17 164 lb 12.8 oz (74.8 kg)  03/12/17 163 lb 6.4 oz (74.1 kg)       ASSESSMENT AND PLAN:  Coronary artery disease of native artery of native heart with stable angina pectoris (HCC) Significant coronary disease seen on CT scan 50 years of smoking Some chest tightness, shortness of breath on heavy exertion Now with worsening fatigue Recommended pharmacologic Myoview Reports he would be unable to treadmill secondary to orthopedic issues  Chronic fatigue - Plan: EKG 12-Lead, NM Myocar Multi W/Spect W/Wall Motion / EF Plan as above will schedule Lexiscan Myoview  Tobacco abuse Reports that he stopped smoking 1 month ago Recommended he restart Chantix if he has desire to start smoking again  Sleeping difficulty - Recommended he talk with primary care physician Start exercise program  Essential hypertension - Plan: EKG 12-Lead Blood pressure is well controlled on today's visit. No changes made to the  medications.  Mixed hyperlipidemia - Plan: NM Myocar Multi W/Spect W/Wall Motion / EF Cholesterol is at goal on the current lipid regimen. No changes to the medications were made.   Disposition:   F/U once a year as needed   Total encounter time more than 60 minutes  Greater than 50% was spent in counseling and coordination of care with the patient    Orders Placed This Encounter  Procedures  . NM Myocar Multi W/Spect W/Wall Motion / EF  . EKG 12-Lead     Signed, Esmond Plants, M.D., Ph.D. 07/27/2017  [place], [place]

## 2017-07-26 DIAGNOSIS — M5030 Other cervical disc degeneration, unspecified cervical region: Secondary | ICD-10-CM | POA: Diagnosis not present

## 2017-07-26 DIAGNOSIS — G5621 Lesion of ulnar nerve, right upper limb: Secondary | ICD-10-CM | POA: Diagnosis not present

## 2017-07-26 DIAGNOSIS — M542 Cervicalgia: Secondary | ICD-10-CM | POA: Diagnosis not present

## 2017-07-26 DIAGNOSIS — M4312 Spondylolisthesis, cervical region: Secondary | ICD-10-CM | POA: Diagnosis not present

## 2017-07-26 DIAGNOSIS — M5412 Radiculopathy, cervical region: Secondary | ICD-10-CM | POA: Diagnosis not present

## 2017-07-27 ENCOUNTER — Ambulatory Visit: Payer: PPO | Admitting: Cardiovascular Disease

## 2017-07-27 ENCOUNTER — Encounter: Payer: Self-pay | Admitting: Cardiovascular Disease

## 2017-07-27 VITALS — BP 112/60 | HR 61 | Ht 67.0 in | Wt 167.8 lb

## 2017-07-27 DIAGNOSIS — I2510 Atherosclerotic heart disease of native coronary artery without angina pectoris: Secondary | ICD-10-CM | POA: Insufficient documentation

## 2017-07-27 DIAGNOSIS — G479 Sleep disorder, unspecified: Secondary | ICD-10-CM

## 2017-07-27 DIAGNOSIS — I10 Essential (primary) hypertension: Secondary | ICD-10-CM

## 2017-07-27 DIAGNOSIS — Z720 Tobacco use: Secondary | ICD-10-CM | POA: Diagnosis not present

## 2017-07-27 DIAGNOSIS — E782 Mixed hyperlipidemia: Secondary | ICD-10-CM

## 2017-07-27 DIAGNOSIS — I25118 Atherosclerotic heart disease of native coronary artery with other forms of angina pectoris: Secondary | ICD-10-CM

## 2017-07-27 DIAGNOSIS — R5382 Chronic fatigue, unspecified: Secondary | ICD-10-CM

## 2017-07-27 NOTE — Patient Instructions (Addendum)
Medication Instructions:   Consider aspirin every other day if tolerated  Labwork:  No new labs needed  Testing/Procedures:  We will schedule a lexiscan myoview  No food morning the test No coffee 24 hours before  Hold they atenolol night before and morning of the test  [place]  Your caregiver has ordered a Stress Test with nuclear imaging. The purpose of this test is to evaluate the blood supply to your heart muscle. This procedure is referred to as a "Non-Invasive Stress Test." This is because other than having an IV started in your vein, nothing is inserted or "invades" your body. Cardiac stress tests are done to find areas of poor blood flow to the heart by determining the extent of coronary artery disease (CAD). Some patients exercise on a treadmill, which naturally increases the blood flow to your heart, while others who are  unable to walk on a treadmill due to physical limitations have a pharmacologic/chemical stress agent called Lexiscan . This medicine will mimic walking on a treadmill by temporarily increasing your coronary blood flow.   Please note: these test may take anywhere between 2-4 hours to complete  PLEASE REPORT TO [place] AT THE FIRST DESK WILL DIRECT YOU WHERE TO GO  Date of Procedure:_____________________________________  Arrival Time for Procedure:______________________________  Instructions regarding medication:   _X___:  Hold Atenolol the night before procedure and morning of procedure   PLEASE NOTIFY THE OFFICE AT LEAST 24 HOURS IN ADVANCE IF YOU ARE UNABLE TO KEEP YOUR APPOINTMENT.  [phone] AND  PLEASE NOTIFY NUCLEAR MEDICINE AT [facility] AT LEAST 24 HOURS IN ADVANCE IF YOU ARE UNABLE TO KEEP YOUR APPOINTMENT. [phone]  How to prepare for your Myoview test:  1. Do not eat or drink after midnight 2. No caffeine for 24 hours prior to test 3. No smoking 24 hours prior to test. 4. Your medication may be  taken with water.  If your doctor stopped a medication because of this test, do not take that medication. 5. Ladies, please do not wear dresses.  Skirts or pants are appropriate. Please wear a short sleeve shirt. 6. No perfume, cologne or lotion. 7. Wear comfortable walking shoes. No heels!       Follow-Up: It was a pleasure seeing you in the office today. Please call us if you have new issues that need to be addressed before your next appt.  [phone]  Your physician wants you to follow-up in: 12 months.  You will receive a reminder letter in the mail two months in advance. If you don't receive a letter, please call our office to schedule the follow-up appointment.  If you need a refill on your cardiac medications before your next appointment, please call your pharmacy.

## 2017-07-30 ENCOUNTER — Encounter
Admission: RE | Admit: 2017-07-30 | Discharge: 2017-07-30 | Disposition: A | Discharge status: Home / Self Care | Payer: PPO | Source: Ambulatory Visit | Attending: Cardiovascular Disease | Admitting: Cardiovascular Disease

## 2017-07-30 DIAGNOSIS — E782 Mixed hyperlipidemia: Secondary | ICD-10-CM | POA: Insufficient documentation

## 2017-07-30 DIAGNOSIS — I25118 Atherosclerotic heart disease of native coronary artery with other forms of angina pectoris: Secondary | ICD-10-CM | POA: Diagnosis not present

## 2017-07-30 DIAGNOSIS — R5382 Chronic fatigue, unspecified: Secondary | ICD-10-CM | POA: Diagnosis not present

## 2017-07-30 DIAGNOSIS — G479 Sleep disorder, unspecified: Secondary | ICD-10-CM | POA: Diagnosis not present

## 2017-07-30 LAB — NM MYOCAR MULTI W/SPECT W/WALL MOTION / EF
CHL CUP MPHR: 149 {beats}/min
CHL CUP NUCLEAR SDS: 0
CSEPED: 0 min
Estimated workload: 1 METS
Exercise duration (sec): 0 s
LVDIAVOL: 74 mL (ref 62–150)
LVSYSVOL: 23 mL
Peak HR: 94 {beats}/min
Percent HR: 63 %
Rest HR: 61 {beats}/min
SRS: 4
SSS: 1
TID: 0.92

## 2017-07-30 MED ORDER — REGADENOSON 0.4 MG/5ML IV SOLN
0.4000 mg | Freq: Once | INTRAVENOUS | Status: AC
  Administered 2017-07-30: 0.4 mg via INTRAVENOUS

## 2017-07-30 MED ORDER — TECHNETIUM TC 99M TETROFOSMIN IV KIT
30.0000 | PACK | Freq: Once | INTRAVENOUS | Status: AC | PRN
  Administered 2017-07-30: 30.96 via INTRAVENOUS

## 2017-07-30 MED ORDER — TECHNETIUM TC 99M TETROFOSMIN IV KIT
10.0000 | PACK | Freq: Once | INTRAVENOUS | Status: AC | PRN
  Administered 2017-07-30: 13.82 via INTRAVENOUS

## 2017-08-02 ENCOUNTER — Other Ambulatory Visit: Payer: Self-pay | Admitting: Orthopedic Surgery

## 2017-08-02 DIAGNOSIS — M4312 Spondylolisthesis, cervical region: Secondary | ICD-10-CM

## 2017-08-02 DIAGNOSIS — M5412 Radiculopathy, cervical region: Secondary | ICD-10-CM

## 2017-08-02 DIAGNOSIS — M5030 Other cervical disc degeneration, unspecified cervical region: Secondary | ICD-10-CM

## 2017-08-03 DIAGNOSIS — G5621 Lesion of ulnar nerve, right upper limb: Secondary | ICD-10-CM | POA: Diagnosis not present

## 2017-08-03 DIAGNOSIS — G5601 Carpal tunnel syndrome, right upper limb: Secondary | ICD-10-CM | POA: Diagnosis not present

## 2017-08-10 ENCOUNTER — Other Ambulatory Visit: Payer: Self-pay | Admitting: Orthopedic Surgery

## 2017-08-10 DIAGNOSIS — M5416 Radiculopathy, lumbar region: Secondary | ICD-10-CM

## 2017-08-12 ENCOUNTER — Ambulatory Visit
Admission: RE | Admit: 2017-08-12 | Discharge: 2017-08-12 | Disposition: A | Discharge status: Home / Self Care | Payer: PPO | Source: Ambulatory Visit | Attending: Orthopedic Surgery | Admitting: Orthopedic Surgery

## 2017-08-12 DIAGNOSIS — M4802 Spinal stenosis, cervical region: Secondary | ICD-10-CM | POA: Diagnosis not present

## 2017-08-12 DIAGNOSIS — M5412 Radiculopathy, cervical region: Secondary | ICD-10-CM | POA: Diagnosis not present

## 2017-08-12 DIAGNOSIS — M5416 Radiculopathy, lumbar region: Secondary | ICD-10-CM

## 2017-08-12 DIAGNOSIS — M5136 Other intervertebral disc degeneration, lumbar region: Secondary | ICD-10-CM | POA: Diagnosis not present

## 2017-08-12 DIAGNOSIS — M5030 Other cervical disc degeneration, unspecified cervical region: Secondary | ICD-10-CM | POA: Diagnosis not present

## 2017-08-12 DIAGNOSIS — M4722 Other spondylosis with radiculopathy, cervical region: Secondary | ICD-10-CM | POA: Insufficient documentation

## 2017-08-12 DIAGNOSIS — M48061 Spinal stenosis, lumbar region without neurogenic claudication: Secondary | ICD-10-CM | POA: Diagnosis not present

## 2017-08-12 DIAGNOSIS — M4312 Spondylolisthesis, cervical region: Secondary | ICD-10-CM

## 2017-08-16 ENCOUNTER — Other Ambulatory Visit: Payer: Self-pay | Admitting: Family Medicine

## 2017-08-17 ENCOUNTER — Ambulatory Visit: Payer: PPO

## 2017-08-17 DIAGNOSIS — G5621 Lesion of ulnar nerve, right upper limb: Secondary | ICD-10-CM | POA: Diagnosis not present

## 2017-08-17 DIAGNOSIS — M4727 Other spondylosis with radiculopathy, lumbosacral region: Secondary | ICD-10-CM | POA: Diagnosis not present

## 2017-08-17 DIAGNOSIS — M5136 Other intervertebral disc degeneration, lumbar region: Secondary | ICD-10-CM | POA: Diagnosis not present

## 2017-08-17 DIAGNOSIS — G5601 Carpal tunnel syndrome, right upper limb: Secondary | ICD-10-CM | POA: Diagnosis not present

## 2017-08-19 ENCOUNTER — Encounter
Admission: RE | Disposition: A | Discharge status: Home / Self Care | Payer: Self-pay | Source: Ambulatory Visit | Attending: Orthopedic Surgery

## 2017-08-19 ENCOUNTER — Encounter: Payer: Self-pay | Admitting: *Deleted

## 2017-08-19 ENCOUNTER — Ambulatory Visit
Admission: RE | Admit: 2017-08-19 | Discharge: 2017-08-19 | Disposition: A | Discharge status: Home / Self Care | Payer: PPO | Source: Ambulatory Visit | Attending: Orthopedic Surgery | Admitting: Orthopedic Surgery

## 2017-08-19 ENCOUNTER — Ambulatory Visit: Payer: PPO | Admitting: Certified Registered"

## 2017-08-19 ENCOUNTER — Other Ambulatory Visit: Payer: Self-pay

## 2017-08-19 DIAGNOSIS — M1990 Unspecified osteoarthritis, unspecified site: Secondary | ICD-10-CM | POA: Insufficient documentation

## 2017-08-19 DIAGNOSIS — Z7951 Long term (current) use of inhaled steroids: Secondary | ICD-10-CM | POA: Insufficient documentation

## 2017-08-19 DIAGNOSIS — Z888 Allergy status to other drugs, medicaments and biological substances status: Secondary | ICD-10-CM | POA: Insufficient documentation

## 2017-08-19 DIAGNOSIS — R7303 Prediabetes: Secondary | ICD-10-CM | POA: Diagnosis not present

## 2017-08-19 DIAGNOSIS — Z87891 Personal history of nicotine dependence: Secondary | ICD-10-CM | POA: Insufficient documentation

## 2017-08-19 DIAGNOSIS — I10 Essential (primary) hypertension: Secondary | ICD-10-CM | POA: Insufficient documentation

## 2017-08-19 DIAGNOSIS — Z79899 Other long term (current) drug therapy: Secondary | ICD-10-CM | POA: Insufficient documentation

## 2017-08-19 DIAGNOSIS — E785 Hyperlipidemia, unspecified: Secondary | ICD-10-CM | POA: Insufficient documentation

## 2017-08-19 DIAGNOSIS — Z885 Allergy status to narcotic agent status: Secondary | ICD-10-CM | POA: Insufficient documentation

## 2017-08-19 DIAGNOSIS — J449 Chronic obstructive pulmonary disease, unspecified: Secondary | ICD-10-CM | POA: Diagnosis not present

## 2017-08-19 DIAGNOSIS — G5621 Lesion of ulnar nerve, right upper limb: Secondary | ICD-10-CM | POA: Diagnosis not present

## 2017-08-19 DIAGNOSIS — G5601 Carpal tunnel syndrome, right upper limb: Secondary | ICD-10-CM | POA: Diagnosis not present

## 2017-08-19 HISTORY — PX: ULNAR TUNNEL RELEASE: SHX820

## 2017-08-19 HISTORY — PX: CARPAL TUNNEL RELEASE: SHX101

## 2017-08-19 LAB — GLUCOSE, CAPILLARY
GLUCOSE-CAPILLARY: 96 mg/dL (ref 65–99)
Glucose-Capillary: 94 mg/dL (ref 65–99)

## 2017-08-19 SURGERY — CARPAL TUNNEL RELEASE
Anesthesia: General | Site: Hand | Laterality: Right | Wound class: Clean

## 2017-08-19 MED ORDER — DEXAMETHASONE SODIUM PHOSPHATE 10 MG/ML IJ SOLN
INTRAMUSCULAR | Status: DC | PRN
  Administered 2017-08-19: 10 mg via INTRAVENOUS

## 2017-08-19 MED ORDER — PHENYLEPHRINE HCL 10 MG/ML IJ SOLN
INTRAMUSCULAR | Status: DC | PRN
  Administered 2017-08-19 (×2): 100 ug via INTRAVENOUS

## 2017-08-19 MED ORDER — OXYCODONE HCL 5 MG PO TABS: 5.0000 mg | ORAL_TABLET | ORAL | Status: DC | PRN

## 2017-08-19 MED ORDER — BUPIVACAINE HCL (PF) 0.5 % IJ SOLN
INTRAMUSCULAR | Status: AC
  Filled 2017-08-19: qty 30

## 2017-08-19 MED ORDER — CEFAZOLIN SODIUM-DEXTROSE 2-4 GM/100ML-% IV SOLN
INTRAVENOUS | Status: AC
  Filled 2017-08-19: qty 100

## 2017-08-19 MED ORDER — PROPOFOL 10 MG/ML IV BOLUS
INTRAVENOUS | Status: DC | PRN
  Administered 2017-08-19: 140 mg via INTRAVENOUS

## 2017-08-19 MED ORDER — ONDANSETRON HCL 4 MG/2ML IJ SOLN: 4.0000 mg | Freq: Once | INTRAMUSCULAR | Status: DC | PRN

## 2017-08-19 MED ORDER — ONDANSETRON HCL 4 MG/2ML IJ SOLN
INTRAMUSCULAR | Status: DC | PRN
  Administered 2017-08-19: 4 mg via INTRAVENOUS

## 2017-08-19 MED ORDER — SODIUM CHLORIDE 0.9 % IV SOLN
INTRAVENOUS | Status: DC
  Administered 2017-08-19: 16:00:00 via INTRAVENOUS

## 2017-08-19 MED ORDER — LIDOCAINE HCL (PF) 2 % IJ SOLN
INTRAMUSCULAR | Status: DC | PRN
  Administered 2017-08-19: 50 mg

## 2017-08-19 MED ORDER — FENTANYL CITRATE (PF) 100 MCG/2ML IJ SOLN: 25.0000 ug | INTRAMUSCULAR | Status: DC | PRN

## 2017-08-19 MED ORDER — CEFAZOLIN SODIUM-DEXTROSE 2-4 GM/100ML-% IV SOLN
2.0000 g | Freq: Once | INTRAVENOUS | Status: AC
  Administered 2017-08-19: 2 g via INTRAVENOUS

## 2017-08-19 MED ORDER — MIDAZOLAM HCL 2 MG/2ML IJ SOLN
INTRAMUSCULAR | Status: AC
  Filled 2017-08-19: qty 2

## 2017-08-19 MED ORDER — MIDAZOLAM HCL 5 MG/5ML IJ SOLN
INTRAMUSCULAR | Status: DC | PRN
  Administered 2017-08-19: 2 mg via INTRAVENOUS

## 2017-08-19 MED ORDER — BUPIVACAINE HCL 0.5 % IJ SOLN
INTRAMUSCULAR | Status: DC | PRN
  Administered 2017-08-19: 20 mL

## 2017-08-19 MED ORDER — FENTANYL CITRATE (PF) 100 MCG/2ML IJ SOLN
INTRAMUSCULAR | Status: AC
  Filled 2017-08-19: qty 2

## 2017-08-19 MED ORDER — FENTANYL CITRATE (PF) 100 MCG/2ML IJ SOLN
INTRAMUSCULAR | Status: DC | PRN
  Administered 2017-08-19: 25 ug via INTRAVENOUS
  Administered 2017-08-19: 50 ug via INTRAVENOUS
  Administered 2017-08-19: 25 ug via INTRAVENOUS

## 2017-08-19 MED ORDER — GLYCOPYRROLATE 0.2 MG/ML IJ SOLN
INTRAMUSCULAR | Status: DC | PRN
  Administered 2017-08-19: 0.2 mg via INTRAVENOUS

## 2017-08-19 SURGICAL SUPPLY — 24 items
BANDAGE ACE 3X5.8 VEL STRL LF (GAUZE/BANDAGES/DRESSINGS) ×4 IMPLANT
BANDAGE ACE 4X5 VEL STRL LF (GAUZE/BANDAGES/DRESSINGS) ×4 IMPLANT
CANISTER SUCT 1200ML W/VALVE (MISCELLANEOUS) ×4 IMPLANT
CHLORAPREP W/TINT 26ML (MISCELLANEOUS) ×4 IMPLANT
CUFF TOURN 18 STER (MISCELLANEOUS) IMPLANT
ELECT CAUTERY NEEDLE 2.0 MIC (NEEDLE) IMPLANT
GAUZE PETRO XEROFOAM 1X8 (MISCELLANEOUS) ×4 IMPLANT
GAUZE SPONGE 4X4 12PLY STRL (GAUZE/BANDAGES/DRESSINGS) ×8 IMPLANT
GAUZE XEROFORM 4X4 STRL (GAUZE/BANDAGES/DRESSINGS) ×8 IMPLANT
GLOVE SURG SYN 9.0  PF PI (GLOVE) ×2
GLOVE SURG SYN 9.0 PF PI (GLOVE) ×2 IMPLANT
GOWN SRG 2XL LVL 4 RGLN SLV (GOWNS) ×2 IMPLANT
GOWN STRL NON-REIN 2XL LVL4 (GOWNS) ×2
GOWN STRL REUS W/ TWL LRG LVL3 (GOWN DISPOSABLE) ×2 IMPLANT
GOWN STRL REUS W/TWL LRG LVL3 (GOWN DISPOSABLE) ×2
KIT TURNOVER KIT A (KITS) ×4 IMPLANT
NS IRRIG 500ML POUR BTL (IV SOLUTION) ×4 IMPLANT
PACK EXTREMITY ARMC (MISCELLANEOUS) ×4 IMPLANT
PAD CAST CTTN 4X4 STRL (SOFTGOODS) ×4 IMPLANT
PADDING CAST COTTON 4X4 STRL (SOFTGOODS) ×4
SCALPEL PROTECTED #15 DISP (BLADE) ×8 IMPLANT
SUT ETHILON 4-0 (SUTURE) ×4
SUT ETHILON 4-0 FS2 18XMFL BLK (SUTURE) ×4
SUTURE ETHLN 4-0 FS2 18XMF BLK (SUTURE) ×4 IMPLANT

## 2017-08-19 NOTE — Telephone Encounter (Signed)
Last OV 06/24/17 last filled 06/23/17 45 1rf

## 2017-08-19 NOTE — Op Note (Signed)
08/19/2017  5:42 PM  PATIENT:  Karl Myers  72 y.o. male  PRE-OPERATIVE DIAGNOSIS:  CARPAL TUNNEL SYNDROM,CUBITAL tunnel syndrome right arm  POST-OPERATIVE DIAGNOSIS:  CARPAL TUNNEL SYNDROM,CUBITAL tunnel syndrome right arm  PROCEDURE:  Procedure(s): CARPAL TUNNEL RELEASE (Right) CUBITAL TUNNEL RELEASE (Right)  SURGEON: Laurene Footman, MD  ASSISTANTS: None  ANESTHESIA:   general  EBL:  Total I/O In: 300 [I.V.:300] Out: -   BLOOD ADMINISTERED:none  DRAINS: none   LOCAL MEDICATIONS USED:  MARCAINE     SPECIMEN:  No Specimen  DISPOSITION OF SPECIMEN:  N/A  COUNTS:  YES  TOURNIQUET:   Total Tourniquet Time Documented: Upper Arm (Right) - 31 minutes Total: Upper Arm (Right) - 31 minutes   IMPLANTS: None  DICTATION: .Dragon Dictation patient was brought to the operating room and after adequate anesthesia was obtained the right arm was prepped and draped in sterile fashion, the appropriate patient identification and timeout procedures were completed.  Tourniquet was raised and the carpal tunnel release was done first with incision over the transcarpal ligament approximately 2 cm incision in line with the ring metacarpal with the transcarpal ligament identified there was some hypertrophic thenar musculature overlying the transverse carpal ligament this was opened and a small incision made through the transverse carpal ligament a vascular hemostat was placed deep to allow for protection of the underlying structures and release 60 out distally to the point where there was fatty tissue around the nerve going proximally going proximally wrist flexion crease about 2 cm there was compression in that area and after release there is good vascular blush the nerve was intact and no intra-carpal tunnel pathology no masses noted.  The wound was infiltrated with 10 cc of half percent Sensorcaine to aid in postop analgesia and the wound closed with some interrupted 4-0 nylon after  irrigation.  Going to the elbow proximally 6 cm incision was made curvilinear incision centered over the medial epicondyle the subcutaneous tissue was spread cutaneous nerves preserved is but well as possible and going posterior to the medial epicondyle the cubital tunnel was entered again vascular hemostat protecting the underlying nerve release is carried out more posterior so that the nerve would not sublux there did appear to be compression right at the level of the medial epicondyle and also entering the FCU tendon the fascia was split to allow for release of pressure on the nerve.  Going more proximally the nerve did not appear to be compressed at all in the proximal extent of the incision and distal humerus area the wound was irrigated and again 10 cc of half percent Sensorcaine was infiltrated into the soft tissue followed by simple interrupted 0 nylon sutures.  Xeroform 4 x 4's web roll and Ace wrap applied followed by letting the tourniquet down  PLAN OF CARE: Discharge to home after PACU  PATIENT DISPOSITION:  PACU - hemodynamically stable.

## 2017-08-19 NOTE — Transfer of Care (Signed)
Immediate Anesthesia Transfer of Care Note  Patient: Denard Tuminello  Procedure(s) Performed: CARPAL TUNNEL RELEASE (Right Hand) CUBITAL TUNNEL RELEASE (Right Elbow)  Patient Location: PACU  Anesthesia Type:General  Level of Consciousness: sedated  Airway & Oxygen Therapy: Patient Spontanous Breathing and Patient connected to face mask oxygen  Post-op Assessment: Report given to RN and Post -op Vital signs reviewed and stable  Post vital signs: Reviewed  Last Vitals:  Vitals:   08/19/17 1522 08/19/17 1731  BP: (!) 142/89 (!) 87/55  Pulse: 80 82  Resp: 18 10  Temp: 36.6 C   SpO2: 93% 95%    Last Pain: There were no vitals filed for this visit.       Complications: No apparent anesthesia complications

## 2017-08-19 NOTE — Discharge Instructions (Addendum)
AMBULATORY SURGERY  DISCHARGE INSTRUCTIONS   1) The drugs that you were given will stay in your system until tomorrow so for the next 24 hours you should not:  A) Drive an automobile B) Make any legal decisions C) Drink any alcoholic beverage   2) You may resume regular meals tomorrow.  Today it is better to start with liquids and gradually work up to solid foods.  You may eat anything you prefer, but it is better to start with liquids, then soup and crackers, and gradually work up to solid foods.   3) Please notify your doctor immediately if you have any unusual bleeding, trouble breathing, redness and pain at the surgery site, drainage, fever, or pain not relieved by medication. 4)   5) Your post-operative visit with Dr.                                     is: Date:                        Time:    Please call to schedule your post-operative visit.  6) Additional Instructions:        Loosen Ace wrap around hand prior to discharge and loosen Ace wrap as if fingers swell.  Leave cotton padding underneath.  Pain medicine as directed, pain prescription was sent in yesterday.  Call office tomorrow morning if having any problems.  Okay to work on elbow and wrist range of motion important to work fingers

## 2017-08-19 NOTE — H&P (Signed)
Reviewed paper H+P, will be scanned into chart. No changes noted.  

## 2017-08-19 NOTE — Anesthesia Procedure Notes (Signed)
Procedure Name: LMA Insertion Performed by: Helio Lack, CRNA Pre-anesthesia Checklist: Patient identified, Patient being monitored, Timeout performed, Emergency Drugs available and Suction available Patient Re-evaluated:Patient Re-evaluated prior to induction Oxygen Delivery Method: Circle system utilized Preoxygenation: Pre-oxygenation with 100% oxygen Induction Type: IV induction Ventilation: Mask ventilation without difficulty LMA: LMA inserted LMA Size: 4.5 Tube type: Oral Number of attempts: 1 Placement Confirmation: positive ETCO2 and breath sounds checked- equal and bilateral Tube secured with: Tape Dental Injury: Teeth and Oropharynx as per pre-operative assessment        

## 2017-08-19 NOTE — Anesthesia Preprocedure Evaluation (Signed)
Anesthesia Evaluation  Patient identified by MRN, date of birth, ID band Patient awake    Reviewed: Allergy & Precautions, H&P , NPO status , Patient's Chart, lab work & pertinent test results, reviewed documented beta blocker date and time   Airway Mallampati: II  TM Distance: >3 FB Neck ROM: full    Dental  (+) Teeth Intact   Pulmonary neg pulmonary ROS, COPD,  COPD inhaler, former smoker,    Pulmonary exam normal        Cardiovascular Exercise Tolerance: Good hypertension, + CAD  negative cardio ROS Normal cardiovascular exam Rate:Normal     Neuro/Psych  Neuromuscular disease negative neurological ROS  negative psych ROS   GI/Hepatic negative GI ROS, Neg liver ROS,   Endo/Other  negative endocrine ROS  Renal/GU negative Renal ROS  negative genitourinary   Musculoskeletal   Abdominal   Peds  Hematology negative hematology ROS (+)   Anesthesia Other Findings   Reproductive/Obstetrics negative OB ROS                             Anesthesia Physical Anesthesia Plan  ASA: II  Anesthesia Plan: General LMA   Post-op Pain Management:    Induction:   PONV Risk Score and Plan: 3  Airway Management Planned:   Additional Equipment:   Intra-op Plan:   Post-operative Plan:   Informed Consent: I have reviewed the patients History and Physical, chart, labs and discussed the procedure including the risks, benefits and alternatives for the proposed anesthesia with the patient or authorized representative who has indicated his/her understanding and acceptance.     Plan Discussed with: CRNA  Anesthesia Plan Comments:         Anesthesia Quick Evaluation

## 2017-08-19 NOTE — Anesthesia Post-op Follow-up Note (Signed)
Anesthesia QCDR form completed.        

## 2017-08-20 ENCOUNTER — Encounter: Payer: Self-pay | Admitting: Orthopedic Surgery

## 2017-08-23 DIAGNOSIS — G5621 Lesion of ulnar nerve, right upper limb: Secondary | ICD-10-CM | POA: Diagnosis not present

## 2017-08-23 DIAGNOSIS — G5601 Carpal tunnel syndrome, right upper limb: Secondary | ICD-10-CM | POA: Diagnosis not present

## 2017-08-24 NOTE — Anesthesia Postprocedure Evaluation (Signed)
Anesthesia Post Note  Patient: Karl Myers  Procedure(s) Performed: CARPAL TUNNEL RELEASE (Right Hand) CUBITAL TUNNEL RELEASE (Right Elbow)  Anesthesia Type: General     Last Vitals:  Vitals:   08/19/17 1815 08/19/17 1828  BP: (!) 142/83 (!) 143/84  Pulse: 77 73  Resp: 13 16  Temp:  36.6 C  SpO2: 94% 95%    Last Pain: There were no vitals filed for this visit.               Molli Barrows

## 2017-09-02 DIAGNOSIS — M5416 Radiculopathy, lumbar region: Secondary | ICD-10-CM | POA: Diagnosis not present

## 2017-09-02 DIAGNOSIS — M48062 Spinal stenosis, lumbar region with neurogenic claudication: Secondary | ICD-10-CM | POA: Diagnosis not present

## 2017-09-02 DIAGNOSIS — M5136 Other intervertebral disc degeneration, lumbar region: Secondary | ICD-10-CM | POA: Diagnosis not present

## 2017-09-27 ENCOUNTER — Other Ambulatory Visit: Payer: Self-pay | Admitting: Family Medicine

## 2017-09-28 NOTE — Telephone Encounter (Signed)
Please see if the patient requested this refill. It appears he reported not taking this previously and this was the starter pack for smoking cessation. Please make sure he is interested in smoking cessation. Please also ensure he has not been taking this medication recently. Thanks.

## 2017-09-28 NOTE — Telephone Encounter (Signed)
Refilled: 06/24/2017 Last OV: 06/24/2017 Next OV: not scheduled

## 2017-09-29 ENCOUNTER — Other Ambulatory Visit: Payer: Self-pay | Admitting: Family Medicine

## 2017-09-29 NOTE — Telephone Encounter (Signed)
Copied from [place]. Topic: Quick Communication - Rx Refill/Question >> Sep 29, 2017  3:15 PM Robina Ade, Helene Kelp D wrote: Medication: varenicline (CHANTIX STARTING MONTH PAK) 0.5 MG X 11 & 1 MG X 42 tablet    Has the patient contacted their pharmacy? Yes (Agent: If no, request that the patient contact the pharmacy for the refill.) Preferred Pharmacy (with phone number or street name):[facility] [place], [place] - [place]: Please be advised that RX refills may take up to 3 business days. We ask that you follow-up with your pharmacy.

## 2017-10-01 MED ORDER — VARENICLINE TARTRATE 1 MG PO TABS: 1.0000 mg | ORAL_TABLET | Freq: Two times a day (BID) | ORAL | 1 refills | Status: DC

## 2017-10-01 NOTE — Telephone Encounter (Signed)
Spoke with pt and he stated that he did take the starter pack and is needing a prescription for the next dose.

## 2017-10-01 NOTE — Telephone Encounter (Signed)
Continuing month pack sent to pharmacy.

## 2017-10-01 NOTE — Telephone Encounter (Signed)
You prescribed this in January. Would you like to refill and keep same instructions?  Please advise

## 2017-10-01 NOTE — Telephone Encounter (Signed)
LOV:06/24/17  PCP: Dr. Caryl Bis  [facility]

## 2017-10-01 NOTE — Telephone Encounter (Signed)
Denied per provider.

## 2017-10-12 DIAGNOSIS — M5136 Other intervertebral disc degeneration, lumbar region: Secondary | ICD-10-CM | POA: Diagnosis not present

## 2017-10-12 DIAGNOSIS — M48062 Spinal stenosis, lumbar region with neurogenic claudication: Secondary | ICD-10-CM | POA: Diagnosis not present

## 2017-10-12 DIAGNOSIS — M5416 Radiculopathy, lumbar region: Secondary | ICD-10-CM | POA: Diagnosis not present

## 2017-10-18 ENCOUNTER — Other Ambulatory Visit: Payer: Self-pay | Admitting: Family Medicine

## 2017-10-25 ENCOUNTER — Encounter: Payer: Self-pay | Admitting: Family Medicine

## 2017-10-25 ENCOUNTER — Other Ambulatory Visit: Payer: Self-pay

## 2017-10-25 ENCOUNTER — Ambulatory Visit (INDEPENDENT_AMBULATORY_CARE_PROVIDER_SITE_OTHER): Payer: PPO | Admitting: Family Medicine

## 2017-10-25 VITALS — BP 140/70 | HR 77 | Temp 98.5°F | Ht 67.0 in | Wt 173.2 lb

## 2017-10-25 DIAGNOSIS — Z1212 Encounter for screening for malignant neoplasm of rectum: Secondary | ICD-10-CM

## 2017-10-25 DIAGNOSIS — Z1211 Encounter for screening for malignant neoplasm of colon: Secondary | ICD-10-CM

## 2017-10-25 DIAGNOSIS — M5412 Radiculopathy, cervical region: Secondary | ICD-10-CM | POA: Diagnosis not present

## 2017-10-25 DIAGNOSIS — I10 Essential (primary) hypertension: Secondary | ICD-10-CM | POA: Diagnosis not present

## 2017-10-25 DIAGNOSIS — G479 Sleep disorder, unspecified: Secondary | ICD-10-CM | POA: Diagnosis not present

## 2017-10-25 DIAGNOSIS — Z720 Tobacco use: Secondary | ICD-10-CM | POA: Diagnosis not present

## 2017-10-25 NOTE — Patient Instructions (Signed)
Nice to see you. We will check lab work today and contact you with the results. Please try to take the Chantix twice daily as prescribed.  If you have abnormal dreams please let us know.  You can then decrease back to once daily.

## 2017-10-25 NOTE — Assessment & Plan Note (Signed)
I encouraged him to increase his Chantix to the twice daily dosing that is prescribed.  If he has abnormal dreams he will let us know and decrease to once daily dosing again.

## 2017-10-25 NOTE — Progress Notes (Signed)
  Tommi Rumps, MD Phone: [phone]  Karl Myers is a 72 y.o. male who presents today for f/u.  CC: htn, anxiety, tobacco abuse  HYPERTENSION  Disease Monitoring  Home BP Monitoring not checking Chest pain- no    Dyspnea- no Medications  Compliance-  Taking lisinopril, atenolol.  Edema- no  Patient does note some anxiety at times related to his grandchildren.  Also notes some sleep difficulty though that is well managed by Xanax and trazodone at night.  No excessive drowsiness the next day.  Does note drowsiness if he takes the trazodone too early and is not in bed.  No depression.  He is taking Chantix once daily.  He is down to half a pack of cigarettes daily.  He has not had any nightmares though is afraid of having them if he goes to twice daily dosing of Chantix.  He had surgery on his elbow and wrist with good benefit.    Social History   Tobacco Use  Smoking Status Current Every Day Smoker  . Packs/day: 1.00  . Last attempt to quit: 06/26/2017  . Years since quitting: 0.3  Smokeless Tobacco Never Used     ROS see history of present illness  Objective  Physical Exam Vitals:   10/25/17 1602  BP: 140/70  Pulse: 77  Temp: 98.5 F (36.9 C)  SpO2: 93%    BP Readings from Last 3 Encounters:  10/25/17 140/70  08/19/17 (!) 143/84  07/27/17 112/60   Wt Readings from Last 3 Encounters:  10/25/17 173 lb 3.2 oz (78.6 kg)  08/19/17 174 lb (78.9 kg)  07/27/17 167 lb 12 oz (76.1 kg)    Physical Exam  Constitutional: No distress.  Cardiovascular: Normal rate, regular rhythm and normal heart sounds.  Pulmonary/Chest: Effort normal and breath sounds normal.  Musculoskeletal: He exhibits no edema.  Neurological: He is alert.  Skin: Skin is warm and dry. He is not diaphoretic.     Assessment/Plan: Please see individual problem list.  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  He will continue to see  orthopedics.  Hypertension Adequately controlled for age.  Continue current regimen.  Check BMP.  Sleeping difficulty Related to anxiety.  Relatively well controlled.  No excessive drowsiness with his current regimen.  He will continue Xanax and trazodone.  Tobacco abuse I encouraged him to increase his Chantix to the twice daily dosing that is prescribed.  If he has abnormal dreams he will let us know and decrease to once daily dosing again.   Health Maintenance: Cologuard ordered.  No personal history of colon polyps.  No family history of colon cancer.  No blood in his bowel movements.  Orders Placed This Encounter  Procedures  . Cologuard  . Basic Metabolic Panel (BMET)    No orders of the defined types were placed in this encounter.    Tommi Rumps, MD [place]

## 2017-10-25 NOTE — Assessment & Plan Note (Signed)
Adequately controlled for age.  Continue current regimen.  Check BMP.

## 2017-10-25 NOTE — Assessment & Plan Note (Signed)
Status post surgical intervention at his wrist and elbow.  He will continue to see orthopedics.

## 2017-10-25 NOTE — Assessment & Plan Note (Signed)
Related to anxiety.  Relatively well controlled.  No excessive drowsiness with his current regimen.  He will continue Xanax and trazodone.

## 2017-10-26 LAB — BASIC METABOLIC PANEL
BUN: 18 mg/dL (ref 6–23)
CALCIUM: 9.2 mg/dL (ref 8.4–10.5)
CO2: 27 mEq/L (ref 19–32)
Chloride: 104 mEq/L (ref 96–112)
Creatinine, Ser: 1.02 mg/dL (ref 0.40–1.50)
GFR: 76.27 mL/min (ref >60.00)
Glucose, Bld: 74 mg/dL (ref 70–99)
Potassium: 4 mEq/L (ref 3.5–5.1)
SODIUM: 140 meq/L (ref 135–145)

## 2017-11-10 DIAGNOSIS — M4727 Other spondylosis with radiculopathy, lumbosacral region: Secondary | ICD-10-CM | POA: Diagnosis not present

## 2017-11-10 DIAGNOSIS — M5136 Other intervertebral disc degeneration, lumbar region: Secondary | ICD-10-CM | POA: Diagnosis not present

## 2017-12-08 ENCOUNTER — Ambulatory Visit (INDEPENDENT_AMBULATORY_CARE_PROVIDER_SITE_OTHER): Payer: PPO | Admitting: Family Medicine

## 2017-12-08 ENCOUNTER — Encounter: Payer: Self-pay | Admitting: Family Medicine

## 2017-12-08 DIAGNOSIS — R05 Cough: Secondary | ICD-10-CM

## 2017-12-08 DIAGNOSIS — R059 Cough, unspecified: Secondary | ICD-10-CM

## 2017-12-08 DIAGNOSIS — M7021 Olecranon bursitis, right elbow: Secondary | ICD-10-CM

## 2017-12-08 MED ORDER — DOXYCYCLINE HYCLATE 100 MG PO TABS: 100.0000 mg | ORAL_TABLET | Freq: Two times a day (BID) | ORAL | 0 refills | Status: DC

## 2017-12-08 MED ORDER — ALBUTEROL SULFATE HFA 108 (90 BASE) MCG/ACT IN AERS: 2.0000 | INHALATION_SPRAY | Freq: Four times a day (QID) | RESPIRATORY_TRACT | 0 refills | Status: DC | PRN

## 2017-12-08 MED ORDER — PREDNISONE 20 MG PO TABS: 40.0000 mg | ORAL_TABLET | Freq: Every day | ORAL | 0 refills | Status: DC
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
## 2017-12-08 NOTE — Patient Instructions (Signed)
Nice to see you. You likely have bronchitis contributing to his COPD exacerbation.  We will place you on prednisone and doxycycline.  If you develop shortness of breath, cough productive of blood, fevers, or any new or changing symptoms please seek medical attention immediately.

## 2017-12-09 DIAGNOSIS — M7021 Olecranon bursitis, right elbow: Secondary | ICD-10-CM | POA: Insufficient documentation

## 2017-12-09 NOTE — Assessment & Plan Note (Signed)
Very mild trauma with bumping on the lawnmower.  No signs of infection or inflammation.  Prednisone may be helpful for this though he needs to see orthopedics as planned.

## 2017-12-09 NOTE — Assessment & Plan Note (Signed)
No injury related to this.  He will monitor.

## 2017-12-09 NOTE — Assessment & Plan Note (Signed)
Presumed to have COPD given history of smoking.  I suspect the patient is having a COPD exacerbation.  We will treat with prednisone, doxycycline, and albuterol.  Consider PFTs once improved.

## 2017-12-13 ENCOUNTER — Other Ambulatory Visit: Payer: Self-pay | Admitting: Family Medicine

## 2017-12-13 DIAGNOSIS — M7021 Olecranon bursitis, right elbow: Secondary | ICD-10-CM | POA: Diagnosis not present

## 2017-12-16 ENCOUNTER — Other Ambulatory Visit: Payer: Self-pay | Admitting: Family Medicine

## 2017-12-16 NOTE — Telephone Encounter (Signed)
Last Ov 12/08/17  Last filled  Alprazolam 10/19/17 45 1rf Trazodone 06/11/17 90 1rf

## 2017-12-17 NOTE — Telephone Encounter (Signed)
Call pt  Does he have enough xanax for pcp return?

## 2017-12-17 NOTE — Telephone Encounter (Signed)
Patient states he just received a refill yesterday

## 2017-12-18 NOTE — Telephone Encounter (Signed)
Sending this to yall- think he only needs trazodone

## 2017-12-19 NOTE — Telephone Encounter (Signed)
Sent to pharmacy.  Drug database reviewed. 

## 2017-12-30 ENCOUNTER — Ambulatory Visit (INDEPENDENT_AMBULATORY_CARE_PROVIDER_SITE_OTHER): Payer: PPO | Admitting: Family Medicine

## 2017-12-30 ENCOUNTER — Encounter: Payer: Self-pay | Admitting: Family Medicine

## 2017-12-30 VITALS — BP 150/74 | HR 91 | Temp 98.4°F | Resp 15 | Wt 173.4 lb

## 2017-12-30 DIAGNOSIS — R05 Cough: Secondary | ICD-10-CM | POA: Diagnosis not present

## 2017-12-30 DIAGNOSIS — J0140 Acute pansinusitis, unspecified: Secondary | ICD-10-CM

## 2017-12-30 DIAGNOSIS — R059 Cough, unspecified: Secondary | ICD-10-CM

## 2017-12-30 MED ORDER — PREDNISONE 10 MG PO TABS
ORAL_TABLET | ORAL | 0 refills | Status: DC
Start: 2017-12-30 — End: 2018-02-02

## 2017-12-30 MED ORDER — PROMETHAZINE-DM 6.25-15 MG/5ML PO SYRP: 5.0000 mL | ORAL_SOLUTION | Freq: Four times a day (QID) | ORAL | 0 refills | Status: DC | PRN

## 2017-12-30 MED ORDER — AMOXICILLIN-POT CLAVULANATE 875-125 MG PO TABS: 1.0000 | ORAL_TABLET | Freq: Two times a day (BID) | ORAL | 0 refills | Status: DC

## 2017-12-30 NOTE — Progress Notes (Signed)
Subjective:    Patient ID: Karl Myers, male    DOB: 1945-12-21, 72 y.o.   MRN: [id number]  HPI  Karl Myers is a 72 year old male who presents today with a cough and nasal congestion that has been present for 4 days. Cough is nonproductive and nasal congestion is most bothersome symptom.  Associated rhinitis has been present. He reports significant nasal drainage and congestion. Associated sinus pressure and post nasal drip present.  He denies fever, chills, sweats, sore throat, ear pain, N/V/D, and SOB.   He was seen on 12/08/17 for symptoms of cough most likely related to a COPD exacerbation and was placed on prednisone, doxycycline, and albuterol. He completed prednisone and doxycycline which provided excellent benefit.  Treatment for symptoms: albuterol inhaler approximately once/twice a day since symptoms started has provided benefit. Treatment with pseudoephedrine has provided limited benefit for nasal congestion   He is a current smoker; he smokes approximately 12 to 15 cigarettes a day which is decline from last year per patient.  No recent sick contact exposure. Recent antibiotic use as described above.   Review of Systems  Constitutional: Negative for chills, fatigue and fever.  HENT: Positive for congestion, postnasal drip, rhinorrhea, sinus pressure and sinus pain. Negative for ear pain, sneezing, sore throat and tinnitus.   Respiratory: Negative for cough, shortness of breath and wheezing.   Cardiovascular: Negative for chest pain and palpitations.  Gastrointestinal: Negative for abdominal pain, diarrhea, nausea and vomiting.  Skin: Negative for rash.  Neurological: Negative for dizziness and headaches.   Past Medical History:  Diagnosis Date  . Arthritis   . COPD (chronic obstructive pulmonary disease) ([place])   . DDD (degenerative disc disease), cervical   . DDD (degenerative disc disease), cervical   . Depression   . Hypertension   . Pre-diabetes        Social History   Socioeconomic History  . Marital status: Married    Spouse name: Not on file  . Number of children: Not on file  . Years of education: Not on file  . Highest education level: Not on file  Occupational History  . Not on file  Social Needs  . Financial resource strain: Not on file  . Food insecurity:    Worry: Not on file    Inability: Not on file  . Transportation needs:    Medical: Not on file    Non-medical: Not on file  Tobacco Use  . Smoking status: Current Every Day Smoker    Packs/day: 1.00    Last attempt to quit: 06/26/2017    Years since quitting: 0.5  . Smokeless tobacco: Never Used  Substance and Sexual Activity  . Alcohol use: No  . Drug use: No  . Sexual activity: Not on file  Lifestyle  . Physical activity:    Days per week: Not on file    Minutes per session: Not on file  . Stress: Not on file  Relationships  . Social connections:    Talks on phone: Not on file    Gets together: Not on file    Attends religious service: Not on file    Active member of club or organization: Not on file    Attends meetings of clubs or organizations: Not on file    Relationship status: Not on file  . Intimate partner violence:    Fear of current or ex partner: Not on file    Emotionally abused: Not on file  Physically abused: Not on file    Forced sexual activity: Not on file  Other Topics Concern  . Not on file  Social History Narrative  . Not on file    Past Surgical History:  Procedure Laterality Date  . BACK SURGERY     x 3  . CARPAL TUNNEL RELEASE Right 08/19/2017   Procedure: CARPAL TUNNEL RELEASE;  Surgeon: Hessie Knows, MD;  Location: [facility] ORS;  Service: Orthopedics;  Laterality: Right;  . CATARACT EXTRACTION W/PHACO Right 11/17/2016   Procedure: CATARACT EXTRACTION PHACO AND INTRAOCULAR LENS PLACEMENT (IOC);  Surgeon: Birder Robson, MD;  Location: [facility] OR[facility];  Service: Ophthalmology;  Laterality: Right;  [place] 00:37 AP% 12.6 CDE  4.69 Fluid pack lot # [id number]  . CATARACT EXTRACTION W/PHACO Left 12/08/2016   Procedure: CATARACT EXTRACTION PHACO AND INTRAOCULAR LENS PLACEMENT (IOC);  Surgeon: Birder Robson, MD;  Location: [facility] ORS;  Service: Ophthalmology;  Laterality: Left;  [place] 00:33 AP% 13.9 CDE 4.63 Fluid pack lot # [id number]  . ULNAR TUNNEL RELEASE Right 08/19/2017   Procedure: CUBITAL TUNNEL RELEASE;  Surgeon: Hessie Knows, MD;  Location: [facility] OR[facility];  Service: Orthopedics;  Laterality: Right;    Family History  Problem Relation Age of Onset  . Heart attack Mother   . Prostate cancer Father   . Breast cancer Sister   . Cancer Brother        unknown what kind    Allergies  Allergen Reactions  . Hydrocodone-Acetaminophen Other (See Comments)    With large quantities "hyper"  . Neurontin [Gabapentin] Other (See Comments)    With large quantities "hyper"     Current Outpatient Medications on File Prior to Visit  Medication Sig Dispense Refill  . albuterol (PROVENTIL HFA;VENTOLIN HFA) 108 (90 Base) MCG/ACT inhaler Inhale 2 puffs into the lungs every 6 (six) hours as needed for wheezing or shortness of breath. 1 Inhaler 0  . ALPRAZolam (XANAX) 0.5 MG tablet TAKE 1/2 TABLET BY MOUTH EVERY MORNING AND 1 TABLET BY MOUTH AT BEDTIME AS DIRECTED 45 tablet 1  . atenolol (TENORMIN) 25 MG tablet TAKE 1 TABLET BY MOUTH AT BEDTIME 90 tablet 3  . lisinopril (PRINIVIL,ZESTRIL) 10 MG tablet Take 1 tablet (10 mg total) by mouth at bedtime. 90 tablet 3  . nitroGLYCERIN (NITROSTAT) 0.4 MG SL tablet Place 0.4 mg under the tongue every 5 (five) minutes x 3 doses as needed. For chest pain.    . simvastatin (ZOCOR) 40 MG tablet Take 1 tablet (40 mg total) by mouth at bedtime. 90 tablet 3  . traMADol (ULTRAM) 50 MG tablet Take 50 mg by mouth as needed.    . traZODone (DESYREL) 150 MG tablet TAKE ONE TABLET BY MOUTH AT BEDTIME 90 tablet 1   Current Facility-Administered Medications on File Prior to Visit  Medication Dose Route  Frequency Provider Last Rate Last Dose  . oxyCODONE (Oxy IR/ROXICODONE) immediate release tablet 5 mg  5 mg Oral Q3H PRN Hessie Knows, MD        BP (!) 150/74 (BP Location: Left Arm, Patient Position: Sitting, Cuff Size: Normal)   Pulse 91   Temp 98.4 F (36.9 C) (Oral)   Resp 15   Wt 173 lb 6 oz (78.6 kg)   SpO2 97%   BMI 27.15 kg/m       Objective:   Physical Exam  Constitutional: He is oriented to person, place, and time. He appears well-developed and well-nourished.  HENT:  Right Ear: Tympanic membrane normal.  Left Ear:  Tympanic membrane normal.  Nose: Rhinorrhea present. Right sinus exhibits maxillary sinus tenderness and frontal sinus tenderness. Left sinus exhibits maxillary sinus tenderness and frontal sinus tenderness.  Mouth/Throat: Oropharynx is clear and moist and mucous membranes are normal.  Eyes: Pupils are equal, round, and reactive to light. No scleral icterus.  Neck: Neck supple.  Cardiovascular: Normal rate, regular rhythm and normal heart sounds.  Pulmonary/Chest: Effort normal and breath sounds normal. He has no wheezes. He has no rales.  Abdominal: Soft. Bowel sounds are normal. There is no tenderness.  Musculoskeletal: He exhibits no edema.  Lymphadenopathy:    He has no cervical adenopathy.  Neurological: He is alert and oriented to person, place, and time.  Skin: Skin is warm. No erythema.  Psychiatric: He has a normal mood and affect. His behavior is normal. Judgment and thought content normal.       Assessment & Plan:  1. Acute pansinusitis, recurrence not specified Symptoms are most consistent with sinusitis. We discussed with duration of symptoms, recommend conservative measure for symptom relief prior to antibiotic therapy. A prescription of Augmentin was provided to take if no improvement or worsening symptoms and we discussed at length appropriate antibiotic use. With recent doxycycline therapy, we discussed the importance of conservative  measures first with reassuring exam today. With significant nasal congestion, provided a short course of prednisone advised  rest, drink plenty of fluids and dextromethorphan/promethazine for cough to use as needed as night and Delsym during the day. Advised patient that decongestants such as sudafed will increase blood pressure and should be avoided.  Follow up if fever >101, if symptoms worsen or if symptoms are not improved in 3 to 4 days. Patient verbalizes understanding.   - amoxicillin-clavulanate (AUGMENTIN) 875-125 MG tablet; Take 1 tablet by mouth 2 (two) times daily.  Dispense: 20 tablet; Refill: 0 - predniSONE (DELTASONE) 10 MG tablet; Take 4 tablets once daily for 2 days, 3 tabs daily for 2 days, 2 tabs daily for 2 days, 1 tab daily for 2 days.  Dispense: 20 tablet; Refill: 0  2. Cough Delsym in the day, promethazine-DM at night if needed. Advised close follow up if no improvement or if he develops fever or SOB, or cough productive of blood. We discussed that he should seek care immediately. He voiced understanding and agreed with plan.  - promethazine-dextromethorphan (PROMETHAZINE-DM) 6.25-15 MG/5ML syrup; Take 5 mLs by mouth 4 (four) times daily as needed for cough.  Dispense: 118 mL; Refill: 0  Delano Metz, FNP-C

## 2017-12-30 NOTE — Patient Instructions (Signed)
Please drink plenty of water so that your urine is pale yellow or clear. Also, get plenty of rest, use prednisone with food for symptoms of nasal congestion.  Cough syrup can be used at night and Delsym is an option for daytime cough.  If symptoms do not improve in 3 to 4 days, worsen, or you develop a fever >101.    Sinusitis, Adult Sinusitis is soreness and inflammation of your sinuses. Sinuses are hollow spaces in the bones around your face. They are located:  Around your eyes.  In the middle of your forehead.  Behind your nose.  In your cheekbones.  Your sinuses and nasal passages are lined with a stringy fluid (mucus). Mucus normally drains out of your sinuses. When your nasal tissues get inflamed or swollen, the mucus can get trapped or blocked so air cannot flow through your sinuses. This lets bacteria, viruses, and funguses grow, and that leads to infection. Follow these instructions at home: Medicines  Take, use, or apply over-the-counter and prescription medicines only as told by your doctor. These may include nasal sprays.  If you were prescribed an antibiotic medicine, take it as told by your doctor. Do not stop taking the antibiotic even if you start to feel better. Hydrate and Humidify  Drink enough water to keep your pee (urine) clear or pale yellow.  Use a cool mist humidifier to keep the humidity level in your home above 50%.  Breathe in steam for 10-15 minutes, 3-4 times a day or as told by your doctor. You can do this in the bathroom while a hot shower is running.  Try not to spend time in cool or dry air. Rest  Rest as much as possible.  Sleep with your head raised (elevated).  Make sure to get enough sleep each night. General instructions  Put a warm, moist washcloth on your face 3-4 times a day or as told by your doctor. This will help with discomfort.  Wash your hands often with soap and water. If there is no soap and water, use hand  sanitizer.  Do not smoke. Avoid being around people who are smoking (secondhand smoke).  Keep all follow-up visits as told by your doctor. This is important. Contact a doctor if:  You have a fever.  Your symptoms get worse.  Your symptoms do not get better within 10 days. Get help right away if:  You have a very bad headache.  You cannot stop throwing up (vomiting).  You have pain or swelling around your face or eyes.  You have trouble seeing.  You feel confused.  Your neck is stiff.  You have trouble breathing. This information is not intended to replace advice given to you by your health care provider. Make sure you discuss any questions you have with your health care provider. Document Released: 11/11/2007 Document Revised: 01/19/2016 Document Reviewed: 03/20/2015 Elsevier Interactive Patient Education  [employer].

## 2018-02-02 ENCOUNTER — Ambulatory Visit (INDEPENDENT_AMBULATORY_CARE_PROVIDER_SITE_OTHER): Payer: PPO | Admitting: Family Medicine

## 2018-02-02 ENCOUNTER — Other Ambulatory Visit: Payer: Self-pay

## 2018-02-02 ENCOUNTER — Ambulatory Visit (INDEPENDENT_AMBULATORY_CARE_PROVIDER_SITE_OTHER): Payer: PPO

## 2018-02-02 ENCOUNTER — Encounter: Payer: Self-pay | Admitting: Family Medicine

## 2018-02-02 VITALS — BP 140/72 | HR 72 | Temp 98.4°F | Resp 15 | Ht 67.0 in | Wt 172.8 lb

## 2018-02-02 VITALS — BP 142/78 | HR 69 | Temp 98.8°F | Ht 67.0 in | Wt 172.0 lb

## 2018-02-02 DIAGNOSIS — R05 Cough: Secondary | ICD-10-CM | POA: Diagnosis not present

## 2018-02-02 DIAGNOSIS — Z0000 Encounter for general adult medical examination without abnormal findings: Secondary | ICD-10-CM

## 2018-02-02 DIAGNOSIS — R053 Chronic cough: Secondary | ICD-10-CM

## 2018-02-02 DIAGNOSIS — R918 Other nonspecific abnormal finding of lung field: Secondary | ICD-10-CM | POA: Diagnosis not present

## 2018-02-02 DIAGNOSIS — Z1159 Encounter for screening for other viral diseases: Secondary | ICD-10-CM | POA: Diagnosis not present

## 2018-02-02 MED ORDER — FLUTICASONE PROPIONATE 50 MCG/ACT NA SUSP: 2.0000 | Freq: Every day | NASAL | 6 refills | Status: DC

## 2018-02-02 MED ORDER — ALBUTEROL SULFATE HFA 108 (90 BASE) MCG/ACT IN AERS: 2.0000 | INHALATION_SPRAY | Freq: Four times a day (QID) | RESPIRATORY_TRACT | 0 refills | Status: DC | PRN

## 2018-02-02 NOTE — Patient Instructions (Signed)
Nice to see you. We will check a chest x-ray today to evaluate for other causes of your cough.  I do suspect it is related to allergies though.  We will trial Flonase for this. If your cough is not improving over the next several weeks with the Flonase please let us know.

## 2018-02-02 NOTE — Progress Notes (Signed)
Tommi Rumps, MD Phone: [phone]  Karl Myers is a 72 y.o. male who presents today for same day visit.  Chronic cough: Patient notes cough for the last 3 months.  Initially seen for likely COPD exacerbation and treated with prednisone, doxycycline, and albuterol.  Symptoms improved with that though he subsequently developed sinusitis and was treated with Augmentin.  He has continued to have cough.  His sinus congestion has resolved.  No sore throat though he has postnasal drip, sneezing, rhinorrhea, and itchy watery eyes.  Notes a deep cough.  Some shortness of breath when he coughs though otherwise no shortness of breath.  No fevers.  No history of allergies.  Social History   Tobacco Use  Smoking Status Current Every Day Smoker  . Packs/day: 1.00  . Last attempt to quit: 06/26/2017  . Years since quitting: 0.6  Smokeless Tobacco Never Used     ROS see history of present illness  Objective  Physical Exam Vitals:   02/02/18 1507 02/02/18 1538  BP: (!) 142/78   Pulse: 69   Temp: 98.8 F (37.1 C)   SpO2: 90% 93%    BP Readings from Last 3 Encounters:  02/02/18 (!) 142/78  02/02/18 140/72  12/30/17 (!) 150/74   Wt Readings from Last 3 Encounters:  02/02/18 172 lb (78 kg)  02/02/18 172 lb 12.8 oz (78.4 kg)  12/30/17 173 lb 6 oz (78.6 kg)    Physical Exam  Constitutional: No distress.  HENT:  Head: Normocephalic and atraumatic.  Mouth/Throat: Oropharynx is clear and moist. No oropharyngeal exudate.  TMs obscured by cerumen, CMA irrigated  Eyes: Pupils are equal, round, and reactive to light. Conjunctivae are normal.  Neck: Neck supple.  Cardiovascular: Normal rate, regular rhythm and normal heart sounds.  Pulmonary/Chest: Effort normal and breath sounds normal.  Musculoskeletal: He exhibits no edema.  Lymphadenopathy:    He has no cervical adenopathy.  Neurological: He is alert.  Skin: Skin is warm and dry. He is not diaphoretic.      Assessment/Plan: Please see individual problem list.  Chronic cough I suspect this is related to allergies given his symptoms.  We will treat with Flonase.  Given his smoking history we will obtain a chest x-ray to make sure there is no other underlying cause.  If not improving over the next several weeks he will contact us to consider further evaluation.  Cerumen impaction: Irrigated by CMA.  Tolerated well though no significant cerumen was removed.  Continue to have this on exam.  Discussed Debrox over-the-counter.   Orders Placed This Encounter  Procedures  . DG Chest 2 View    Standing Status:   Future    Number of Occurrences:   1    Standing Expiration Date:   04/05/2019    Order Specific Question:   Reason for Exam (SYMPTOM  OR DIAGNOSIS REQUIRED)    Answer:   chronic cough, current smoker    Order Specific Question:   Preferred imaging location?    Answer:   [facility] Specific Question:   Radiology Contrast Protocol - do NOT remove file path    Answer:   [email or web address]    Meds ordered this encounter  Medications  . fluticasone (FLONASE) 50 MCG/ACT nasal spray    Sig: Place 2 sprays into both nostrils daily.    Dispense:  16 g    Refill:  [place], MD [place]  [facility]  

## 2018-02-02 NOTE — Assessment & Plan Note (Signed)
I suspect this is related to allergies given his symptoms.  We will treat with Flonase.  Given his smoking history we will obtain a chest x-ray to make sure there is no other underlying cause.  If not improving over the next several weeks he will contact us to consider further evaluation.

## 2018-02-02 NOTE — Patient Instructions (Addendum)
  Karl Myers , Thank you for taking time to come for your Medicare Wellness Visit. I appreciate your ongoing commitment to your health goals. Please review the following plan we discussed and let me know if I can assist you in the future.   These are the goals we discussed: Goals    . Try to stop smoking again       This is a list of the screening recommended for you and due dates:  Health Maintenance  Topic Date Due  .  Hepatitis C: One time screening is recommended by Center for Disease Control  (CDC) for  adults born from [birth year] through 1965.   1945-11-18  . Tetanus Vaccine  08/30/1964  . Cologuard (Stool DNA test)  08/31/1995  . Pneumonia vaccines (1 of 2 - PCV13) 08/31/2010  . Flu Shot  01/06/2018

## 2018-02-02 NOTE — Progress Notes (Signed)
Subjective:   Karl Myers is a 72 y.o. male who presents for an Initial Medicare Annual Wellness Visit.  Review of Systems  No ROS.  Medicare Wellness Visit. Additional risk factors are reflected in the social history.  Cardiac Risk Factors include: advanced age (>20men, >42 women);male gender;hypertension    Objective:    Today's Vitals   02/02/18 1043  BP: 140/72  Pulse: 72  Resp: 15  Temp: 98.4 F (36.9 C)  TempSrc: Oral  SpO2: 93%  Weight: 172 lb 12.8 oz (78.4 kg)  Height: 5\' 7"  (1.702 m)   Body mass index is 27.06 kg/m.  Advanced Directives 02/02/2018 08/19/2017 12/08/2016  Does Patient Have a Medical Advance Directive? Yes No No  Type of Paramedic of [place];Living will - -  Does patient want to make changes to medical advance directive? No - Patient declined - -  Copy of [place] in Chart? Yes - -  Would patient like information on creating a medical advance directive? - No - Patient declined No - Patient declined    Current Medications (verified) Outpatient Encounter Medications as of 02/02/2018  Medication Sig  . albuterol (PROVENTIL HFA;VENTOLIN HFA) 108 (90 Base) MCG/ACT inhaler Inhale 2 puffs into the lungs every 6 (six) hours as needed for wheezing or shortness of breath.  . ALPRAZolam (XANAX) 0.5 MG tablet TAKE 1/2 TABLET BY MOUTH EVERY MORNING AND 1 TABLET BY MOUTH AT BEDTIME AS DIRECTED  . atenolol (TENORMIN) 25 MG tablet TAKE 1 TABLET BY MOUTH AT BEDTIME  . lisinopril (PRINIVIL,ZESTRIL) 10 MG tablet Take 1 tablet (10 mg total) by mouth at bedtime.  . nitroGLYCERIN (NITROSTAT) 0.4 MG SL tablet Place 0.4 mg under the tongue every 5 (five) minutes x 3 doses as needed. For chest pain.  . simvastatin (ZOCOR) 40 MG tablet Take 1 tablet (40 mg total) by mouth at bedtime.  . traMADol (ULTRAM) 50 MG tablet Take 50 mg by mouth as needed.  . traZODone (DESYREL) 150 MG tablet TAKE ONE TABLET BY MOUTH AT BEDTIME  .  [DISCONTINUED] amoxicillin-clavulanate (AUGMENTIN) 875-125 MG tablet Take 1 tablet by mouth 2 (two) times daily.  . [DISCONTINUED] predniSONE (DELTASONE) 10 MG tablet Take 4 tablets once daily for 2 days, 3 tabs daily for 2 days, 2 tabs daily for 2 days, 1 tab daily for 2 days.  . [DISCONTINUED] promethazine-dextromethorphan (PROMETHAZINE-DM) 6.25-15 MG/5ML syrup Take 5 mLs by mouth 4 (four) times daily as needed for cough.   Facility-Administered Encounter Medications as of 02/02/2018  Medication  . oxyCODONE (Oxy IR/ROXICODONE) immediate release tablet 5 mg    Allergies (verified) Hydrocodone-acetaminophen and Neurontin [gabapentin]   History: Past Medical History:  Diagnosis Date  . Arthritis   . COPD (chronic obstructive pulmonary disease) ([place])   . DDD (degenerative disc disease), cervical   . DDD (degenerative disc disease), cervical   . Depression   . Hypertension   . Pre-diabetes    Past Surgical History:  Procedure Laterality Date  . BACK SURGERY     x 3  . CARPAL TUNNEL RELEASE Right 08/19/2017   Procedure: CARPAL TUNNEL RELEASE;  Surgeon: Hessie Knows, MD;  Location: [facility] ORS;  Service: Orthopedics;  Laterality: Right;  . CATARACT EXTRACTION W/PHACO Right 11/17/2016   Procedure: CATARACT EXTRACTION PHACO AND INTRAOCULAR LENS PLACEMENT (IOC);  Surgeon: Birder Robson, MD;  Location: [facility] ORS;  Service: Ophthalmology;  Laterality: Right;  [place] 00:37 AP% 12.6 CDE 4.69 Fluid pack lot # [id number]  .  CATARACT EXTRACTION W/PHACO Left 12/08/2016   Procedure: CATARACT EXTRACTION PHACO AND INTRAOCULAR LENS PLACEMENT (IOC);  Surgeon: Birder Robson, MD;  Location: [facility] ORS;  Service: Ophthalmology;  Laterality: Left;  [place] 00:33 AP% 13.9 CDE 4.63 Fluid pack lot # [id number]  . ULNAR TUNNEL RELEASE Right 08/19/2017   Procedure: CUBITAL TUNNEL RELEASE;  Surgeon: Hessie Knows, MD;  Location: [facility] ORS;  Service: Orthopedics;  Laterality: Right;   Family History  Problem Relation Age  of Onset  . Heart attack Mother   . Prostate cancer Father   . Breast cancer Sister   . Dementia Sister   . Cancer Brother        unknown what kind   Social History   Socioeconomic History  . Marital status: Married    Spouse name: Not on file  . Number of children: Not on file  . Years of education: Not on file  . Highest education level: Not on file  Occupational History  . Not on file  Social Needs  . Financial resource strain: Not hard at all  . Food insecurity:    Worry: Never true    Inability: Never true  . Transportation needs:    Medical: No    Non-medical: No  Tobacco Use  . Smoking status: Current Every Day Smoker    Packs/day: 1.00    Last attempt to quit: 06/26/2017    Years since quitting: 0.6  . Smokeless tobacco: Never Used  Substance and Sexual Activity  . Alcohol use: No  . Drug use: No  . Sexual activity: Not on file  Lifestyle  . Physical activity:    Days per week: 0 days    Minutes per session: Not on file  . Stress: Not at all  Relationships  . Social connections:    Talks on phone: Not on file    Gets together: Not on file    Attends religious service: Not on file    Active member of club or organization: Not on file    Attends meetings of clubs or organizations: Not on file    Relationship status: Not on file  Other Topics Concern  . Not on file  Social History Narrative  . Not on file   Tobacco Counseling Ready to quit: Not Answered Counseling given: Not Answered   Clinical Intake:  Pre-visit preparation completed: Yes  Pain : No/denies pain     Nutritional Status: BMI 25 -29 Overweight Diabetes: No  How often do you need to have someone help you when you read instructions, pamphlets, or other written materials from your doctor or pharmacy?: 1 - Never  Interpreter Needed?: No     Activities of Daily Living In your present state of health, do you have any difficulty performing the following activities: 02/02/2018    Hearing? N  Vision? N  Difficulty concentrating or making decisions? N  Walking or climbing stairs? N  Dressing or bathing? N  Doing errands, shopping? N  Preparing Food and eating ? N  Using the Toilet? N  In the past six months, have you accidently leaked urine? N  Do you have problems with loss of bowel control? N  Managing your Medications? N  Managing your Finances? N  Housekeeping or managing your Housekeeping? N  Some recent data might be hidden     Immunizations and Health Maintenance Immunization History  Administered Date(s) Administered  . Influenza, High Dose Seasonal PF 03/12/2017   Health Maintenance Due  Topic Date Due  .  Hepatitis C Screening  1946/05/10  . TETANUS/TDAP  08/30/1964  . Fecal DNA (Cologuard)  08/31/1995  . PNA vac Low Risk Adult (1 of 2 - PCV13) 08/31/2010  . INFLUENZA VACCINE  01/06/2018    Patient Care Team: Leone Haven, MD as PCP - General (Family Medicine)  Indicate any recent Medical Services you may have received from other than [facility] providers in the past year (date may be approximate).    Assessment:   This is a routine wellness examination for [place]. The goal of the wellness visit is to assist the patient how to close the gaps in care and create a preventative care plan for the patient.   The roster of all physicians providing medical care to patient is listed in the Snapshot section of the chart.  Safety issues reviewed; Smoke and carbon monoxide detectors in the home. Firearm safety discussed. Wears seatbelts when driving or riding with others. No violence in the home.  They do not have excessive sun exposure.  Discussed the need for sun protection: hats, long sleeves and the use of sunscreen if there is significant sun exposure.  Patient is alert, normal appearance, oriented to person/place/and time.  Correctly identified the president of the [place] and recalls of 3/3 words. Performs simple calculations and can read  correct time from watch face.  Displays appropriate judgement.  No new identified risk were noted.  No failures at ADL's or IADL's.    BMI- discussed the importance of a healthy diet, water intake and the benefits of aerobic exercise. Educational material provided.   24 hour diet recall: Regular diet  Sleep patterns- Sleeps without issues through the night.   Hep C Screening consent given.    Prevnar 13 vaccine discussed.   Patient Concerns:Cough x2 months. Same day appointment scheduled with pcp.   Hearing/Vision screen Hearing Screening Comments: Patient is able to hear conversational tones without difficulty.  No issues reported.   Vision Screening Comments: Followed by [facility] cneter Wears reading glasses Annual exam Cataract extraction, bilateral Visual acuity not assessed per patient preference since they have regular follow up with the ophthalmologist  Dietary issues and exercise activities discussed: Current Exercise Habits: The patient does not participate in regular exercise at present  Goals    . Try to stop smoking again      Depression Screen PHQ 2/9 Scores 02/02/2018 03/12/2017  PHQ - 2 Score 0 0    Fall Risk Fall Risk  02/02/2018 03/12/2017  Falls in the past year? No No   Cognitive Function: MMSE - Mini Mental State Exam 02/02/2018  Orientation to time 5  Orientation to Place 5  Registration 3  Attention/ Calculation 5  Recall 3  Language- name 2 objects 2  Language- repeat 1  Language- follow 3 step command 3  Language- read & follow direction 1  Write a sentence 1  Copy design 1  Total score 30        Screening Tests Health Maintenance  Topic Date Due  . Hepatitis C Screening  29-Aug-1945  . TETANUS/TDAP  08/30/1964  . Fecal DNA (Cologuard)  08/31/1995  . PNA vac Low Risk Adult (1 of 2 - PCV13) 08/31/2010  . INFLUENZA VACCINE  01/06/2018      Plan:    End of life planning; Advance aging; Advanced directives discussed. Copy of  current HCPOA/Living Will on file.    I have personally reviewed and noted the following in the patient's chart:   . Medical  and social history . Use of alcohol, tobacco or illicit drugs  . Current medications and supplements . Functional ability and status . Nutritional status . Physical activity . Advanced directives . List of other physicians . Hospitalizations, surgeries, and ER visits in previous 12 months . Vitals . Screenings to include cognitive, depression, and falls . Referrals and appointments  In addition, I have reviewed and discussed with patient certain preventive protocols, quality metrics, and best practice recommendations. A written personalized care plan for preventive services as well as general preventive health recommendations were provided to patient.     Varney Biles, LPN   2/53/6644

## 2018-02-03 LAB — HEPATITIS C ANTIBODY
HEP C AB: NONREACTIVE
SIGNAL TO CUT-OFF: 0.01 (ref <1.00)

## 2018-02-03 NOTE — Progress Notes (Signed)
I have reviewed the above note and agree.  Chanley Mcenery, M.D.  

## 2018-03-01 ENCOUNTER — Other Ambulatory Visit: Payer: Self-pay | Admitting: Family Medicine

## 2018-03-15 ENCOUNTER — Other Ambulatory Visit: Payer: Self-pay | Admitting: Family Medicine

## 2018-03-15 NOTE — Telephone Encounter (Signed)
Last OV 02/02/2018   Last refilled 12/19/2017 disp 45 with 1 refill   Sent to PCP for approval

## 2018-03-17 NOTE — Telephone Encounter (Signed)
Sent to pharmacy.  Controlled substance database reviewed. 

## 2018-04-13 ENCOUNTER — Other Ambulatory Visit: Payer: Self-pay | Admitting: Family Medicine

## 2018-04-13 NOTE — Telephone Encounter (Signed)
Patient last seen 8/28 & last refilled 10/10.

## 2018-04-15 NOTE — Telephone Encounter (Signed)
Controlled substance database reviewed. Sent to pharmacy.  Due for refill 05/13/18. Sent to be refilled on that day.

## 2018-06-15 ENCOUNTER — Other Ambulatory Visit: Payer: Self-pay | Admitting: Family Medicine

## 2018-06-15 NOTE — Telephone Encounter (Signed)
Controlled substance database reviewed. Sent to pharmacy.   

## 2018-06-15 NOTE — Telephone Encounter (Signed)
Last OV 02/02/2018   Alprazolam 05/13/2018 disp 45 with no refills   Trazodone 12/19/2017 disp 90 with 1 refill   Sent to PCP for approval

## 2018-06-17 ENCOUNTER — Ambulatory Visit (INDEPENDENT_AMBULATORY_CARE_PROVIDER_SITE_OTHER): Payer: PPO | Admitting: Family Medicine

## 2018-06-17 ENCOUNTER — Encounter: Payer: Self-pay | Admitting: Family Medicine

## 2018-06-17 VITALS — BP 138/80 | HR 71 | Temp 98.1°F | Ht 67.0 in | Wt 171.4 lb

## 2018-06-17 DIAGNOSIS — J069 Acute upper respiratory infection, unspecified: Secondary | ICD-10-CM

## 2018-06-17 DIAGNOSIS — R7303 Prediabetes: Secondary | ICD-10-CM | POA: Diagnosis not present

## 2018-06-17 DIAGNOSIS — I10 Essential (primary) hypertension: Secondary | ICD-10-CM

## 2018-06-17 DIAGNOSIS — Z8042 Family history of malignant neoplasm of prostate: Secondary | ICD-10-CM

## 2018-06-17 DIAGNOSIS — G479 Sleep disorder, unspecified: Secondary | ICD-10-CM

## 2018-06-17 NOTE — Patient Instructions (Signed)
Nice to see you. Please monitor your respiratory symptoms.  If they do not continue to improve over the next several days please call us on Monday for an antibiotic.  If you develop cough productive of blood, fevers, or shortness of breath you need to be evaluated again. We will contact you with your lab results.

## 2018-06-17 NOTE — Assessment & Plan Note (Signed)
Stable on current medication.  He will continue these.  Refills given previously.  Controlled substance database reviewed.

## 2018-06-17 NOTE — Progress Notes (Signed)
Tommi Rumps, MD Phone: [phone]  Karl Myers is a 73 y.o. male who presents today for follow-up.  CC: URI, sleep difficulty, hypertension, family history of prostate cancer  URI: Patient notes symptoms starting 6 to 7 days ago.  He has had some rhinorrhea that is improving.  He notes some nasal and sinus congestion that is improving today.  He is blowing clear mucus out of his nose.  No fevers.  No sick contacts.  Some mild cough in the morning.  Flonase is beneficial.  Sleep difficulty: Taking trazodone and Xanax.  This is beneficial and he sleeps well.  No drowsiness.  No alcohol use.  Hypertension: He is not checking this at home.  He is taking lisinopril and atenolol.  No chest pain, shortness of breath, or edema.  Family history prostate cancer: Patient's father had prostate cancer.  Due for PSA check.  Social History   Tobacco Use  Smoking Status Current Every Day Smoker  . Packs/day: 1.00  . Last attempt to quit: 06/26/2017  . Years since quitting: 0.9  Smokeless Tobacco Never Used     ROS see history of present illness  Objective  Physical Exam Vitals:   06/17/18 1416  BP: 138/80  Pulse: 71  Temp: 98.1 F (36.7 C)  SpO2: 96%    BP Readings from Last 3 Encounters:  06/17/18 138/80  02/02/18 (!) 142/78  02/02/18 140/72   Wt Readings from Last 3 Encounters:  06/17/18 171 lb 6.4 oz (77.7 kg)  02/02/18 172 lb (78 kg)  02/02/18 172 lb 12.8 oz (78.4 kg)    Physical Exam Constitutional:      General: He is not in acute distress.    Appearance: He is not diaphoretic.  HENT:     Head: Normocephalic and atraumatic.     Ears:     Comments: Cerumen bilateral ear canals    Mouth/Throat:     Mouth: Mucous membranes are moist.     Pharynx: Oropharynx is clear.  Eyes:     Conjunctiva/sclera: Conjunctivae normal.     Pupils: Pupils are equal, round, and reactive to light.  Cardiovascular:     Rate and Rhythm: Normal rate and regular rhythm.   Heart sounds: Normal heart sounds.  Pulmonary:     Effort: Pulmonary effort is normal.     Breath sounds: Normal breath sounds.  Lymphadenopathy:     Cervical: No cervical adenopathy.  Skin:    General: Skin is warm and dry.  Neurological:     Mental Status: He is alert.      Assessment/Plan: Please see individual problem list.  Hypertension Adequately controlled for age.  Continue current regimen.  Check lab work.  Family history of prostate cancer in father Check PSA.  Sleeping difficulty Stable on current medication.  He will continue these.  Refills given previously.  Controlled substance database reviewed.  URI (upper respiratory infection) Symptoms are most consistent with a URI.  Patient will monitor through the weekend and if not continuing to improve he will contact us on Monday to consider an antibiotic.  Given return precautions. I discussed ear irrigation though he had significant discomfort the last time this was completed so he will work on Debrox drops at home.  Health maintenance: Patient notes he had a pneumonia shot last year.  I discussed requesting records though he said that the doctor would not send them and do not bother.  Patient also reports he is had a tetanus shot in the  last 10 years.  He declines colon cancer screening.  I discussed the risk of missing a polyp that could turn into cancer or a colon cancer and he still declined.  Orders Placed This Encounter  Procedures  . Lipid panel  . Comp Met (CMET)  . HgB A1c  . PSA    No orders of the defined types were placed in this encounter.    Tommi Rumps, MD [place]

## 2018-06-17 NOTE — Assessment & Plan Note (Signed)
Adequately controlled for age.  Continue current regimen.  Check lab work.

## 2018-06-17 NOTE — Assessment & Plan Note (Signed)
Check PSA. ?

## 2018-06-17 NOTE — Assessment & Plan Note (Signed)
Symptoms are most consistent with a URI.  Patient will monitor through the weekend and if not continuing to improve he will contact us on Monday to consider an antibiotic.  Given return precautions.

## 2018-06-18 LAB — LIPID PANEL
CHOLESTEROL: 137 mg/dL (ref <200)
HDL: 36 mg/dL — ABNORMAL LOW (ref >40)
LDL Cholesterol (Calc): 75 mg/dL (calc)
Non-HDL Cholesterol (Calc): 101 mg/dL (calc) (ref <130)
TRIGLYCERIDES: 165 mg/dL — AB (ref <150)
Total CHOL/HDL Ratio: 3.8 (calc) (ref <5.0)

## 2018-06-18 LAB — COMPREHENSIVE METABOLIC PANEL
AG RATIO: 2 (calc) (ref 1.0–2.5)
ALT: 9 U/L (ref 9–46)
AST: 15 U/L (ref 10–35)
Albumin: 4.1 g/dL (ref 3.6–5.1)
Alkaline phosphatase (APISO): 76 U/L (ref 40–115)
BUN: 17 mg/dL (ref 7–25)
CHLORIDE: 104 mmol/L (ref 98–110)
CO2: 23 mmol/L (ref 20–32)
Calcium: 8.8 mg/dL (ref 8.6–10.3)
Creat: 1 mg/dL (ref 0.70–1.18)
GLOBULIN: 2.1 g/dL (ref 1.9–3.7)
GLUCOSE: 142 mg/dL — AB (ref 65–99)
Potassium: 4.2 mmol/L (ref 3.5–5.3)
SODIUM: 140 mmol/L (ref 135–146)
Total Bilirubin: 0.3 mg/dL (ref 0.2–1.2)
Total Protein: 6.2 g/dL (ref 6.1–8.1)

## 2018-06-18 LAB — PSA: PSA: 0.6 ng/mL (ref <=4.0)

## 2018-06-18 LAB — HEMOGLOBIN A1C
Hgb A1c MFr Bld: 6 % of total Hgb — ABNORMAL HIGH (ref <5.7)
MEAN PLASMA GLUCOSE: 126 (calc)
eAG (mmol/L): 7 (calc)

## 2018-06-23 ENCOUNTER — Other Ambulatory Visit: Payer: Self-pay | Admitting: Family Medicine

## 2018-06-23 DIAGNOSIS — E785 Hyperlipidemia, unspecified: Secondary | ICD-10-CM

## 2018-06-23 MED ORDER — ATORVASTATIN CALCIUM 40 MG PO TABS: 40.0000 mg | ORAL_TABLET | Freq: Every day | ORAL | 3 refills | Status: DC

## 2018-07-08 DIAGNOSIS — H2000 Unspecified acute and subacute iridocyclitis: Secondary | ICD-10-CM | POA: Diagnosis not present

## 2018-07-21 DIAGNOSIS — H26493 Other secondary cataract, bilateral: Secondary | ICD-10-CM | POA: Diagnosis not present

## 2018-07-29 ENCOUNTER — Other Ambulatory Visit: Payer: PPO

## 2018-08-07 DIAGNOSIS — J189 Pneumonia, unspecified organism: Secondary | ICD-10-CM | POA: Diagnosis not present

## 2018-08-11 ENCOUNTER — Other Ambulatory Visit (INDEPENDENT_AMBULATORY_CARE_PROVIDER_SITE_OTHER): Payer: PPO

## 2018-08-11 DIAGNOSIS — E785 Hyperlipidemia, unspecified: Secondary | ICD-10-CM | POA: Diagnosis not present

## 2018-08-11 LAB — HEPATIC FUNCTION PANEL
ALT: 14 U/L (ref 0–53)
AST: 17 U/L (ref 0–37)
Albumin: 4.3 g/dL (ref 3.5–5.2)
Alkaline Phosphatase: 75 U/L (ref 39–117)
Bilirubin, Direct: 0.1 mg/dL (ref 0.0–0.3)
Total Bilirubin: 0.4 mg/dL (ref 0.2–1.2)
Total Protein: 7.5 g/dL (ref 6.0–8.3)

## 2018-08-11 LAB — LDL CHOLESTEROL, DIRECT: Direct LDL: 82 mg/dL

## 2018-08-15 ENCOUNTER — Other Ambulatory Visit: Payer: Self-pay | Admitting: Family Medicine

## 2018-08-15 NOTE — Telephone Encounter (Signed)
Last OV 06/17/2018  Last refilled 06/15/2018 disp 45 with 1 refill   Sent to PCP for approval

## 2018-08-25 ENCOUNTER — Ambulatory Visit: Payer: PPO | Admitting: Family Medicine

## 2018-08-25 ENCOUNTER — Telehealth: Payer: Self-pay

## 2018-08-25 ENCOUNTER — Other Ambulatory Visit: Payer: Self-pay

## 2018-08-25 ENCOUNTER — Emergency Department: Payer: PPO

## 2018-08-25 ENCOUNTER — Emergency Department
Admission: EM | Admit: 2018-08-25 | Discharge: 2018-08-25 | Disposition: A | Discharge status: Home / Self Care | Payer: PPO | Attending: Emergency Medicine | Admitting: Emergency Medicine

## 2018-08-25 DIAGNOSIS — I2510 Atherosclerotic heart disease of native coronary artery without angina pectoris: Secondary | ICD-10-CM | POA: Insufficient documentation

## 2018-08-25 DIAGNOSIS — Z79899 Other long term (current) drug therapy: Secondary | ICD-10-CM | POA: Insufficient documentation

## 2018-08-25 DIAGNOSIS — J441 Chronic obstructive pulmonary disease with (acute) exacerbation: Secondary | ICD-10-CM | POA: Diagnosis not present

## 2018-08-25 DIAGNOSIS — R05 Cough: Secondary | ICD-10-CM | POA: Diagnosis not present

## 2018-08-25 DIAGNOSIS — J069 Acute upper respiratory infection, unspecified: Secondary | ICD-10-CM

## 2018-08-25 DIAGNOSIS — F17210 Nicotine dependence, cigarettes, uncomplicated: Secondary | ICD-10-CM | POA: Diagnosis not present

## 2018-08-25 DIAGNOSIS — R0602 Shortness of breath: Secondary | ICD-10-CM | POA: Diagnosis present

## 2018-08-25 DIAGNOSIS — I10 Essential (primary) hypertension: Secondary | ICD-10-CM | POA: Insufficient documentation

## 2018-08-25 DIAGNOSIS — R0789 Other chest pain: Secondary | ICD-10-CM | POA: Diagnosis not present

## 2018-08-25 DIAGNOSIS — B9789 Other viral agents as the cause of diseases classified elsewhere: Secondary | ICD-10-CM | POA: Diagnosis not present

## 2018-08-25 LAB — CBC WITH DIFFERENTIAL/PLATELET
Abs Immature Granulocytes: 0.03 10*3/uL (ref 0.00–0.07)
Basophils Absolute: 0.1 10*3/uL (ref 0.0–0.1)
Basophils Relative: 1 %
Eosinophils Absolute: 0.2 10*3/uL (ref 0.0–0.5)
Eosinophils Relative: 2 %
HCT: 46.6 % (ref 39.0–52.0)
Hemoglobin: 15.1 g/dL (ref 13.0–17.0)
Immature Granulocytes: 0 %
LYMPHS ABS: 1.9 10*3/uL (ref 0.7–4.0)
Lymphocytes Relative: 22 %
MCH: 31.7 pg (ref 26.0–34.0)
MCHC: 32.4 g/dL (ref 30.0–36.0)
MCV: 97.7 fL (ref 80.0–100.0)
Monocytes Absolute: 0.6 10*3/uL (ref 0.1–1.0)
Monocytes Relative: 7 %
NRBC: 0 % (ref 0.0–0.2)
Neutro Abs: 6 10*3/uL (ref 1.7–7.7)
Neutrophils Relative %: 68 %
Platelets: 249 10*3/uL (ref 150–400)
RBC: 4.77 MIL/uL (ref 4.22–5.81)
RDW: 13.5 % (ref 11.5–15.5)
WBC: 8.9 10*3/uL (ref 4.0–10.5)

## 2018-08-25 LAB — COMPREHENSIVE METABOLIC PANEL
ALBUMIN: 4.2 g/dL (ref 3.5–5.0)
ALT: 15 U/L (ref 0–44)
AST: 22 U/L (ref 15–41)
Alkaline Phosphatase: 67 U/L (ref 38–126)
Anion gap: 8 (ref 5–15)
BUN: 16 mg/dL (ref 8–23)
CO2: 26 mmol/L (ref 22–32)
Calcium: 9.1 mg/dL (ref 8.9–10.3)
Chloride: 105 mmol/L (ref 98–111)
Creatinine, Ser: 0.92 mg/dL (ref 0.61–1.24)
GFR calc Af Amer: >60 mL/min (ref >60)
GFR calc non Af Amer: >60 mL/min (ref >60)
Glucose, Bld: 103 mg/dL — ABNORMAL HIGH (ref 70–99)
POTASSIUM: 4.3 mmol/L (ref 3.5–5.1)
Sodium: 139 mmol/L (ref 135–145)
Total Bilirubin: 0.6 mg/dL (ref 0.3–1.2)
Total Protein: 7.6 g/dL (ref 6.5–8.1)

## 2018-08-25 LAB — INFLUENZA PANEL BY PCR (TYPE A & B)
INFLAPCR: NEGATIVE
Influenza B By PCR: NEGATIVE

## 2018-08-25 LAB — TROPONIN I: Troponin I: <0.03 ng/mL (ref <0.03)

## 2018-08-25 MED ORDER — PREDNISONE 50 MG PO TABS: 50.0000 mg | ORAL_TABLET | Freq: Every day | ORAL | 0 refills | Status: DC

## 2018-08-25 MED ORDER — ALBUTEROL SULFATE HFA 108 (90 BASE) MCG/ACT IN AERS: 2.0000 | INHALATION_SPRAY | Freq: Four times a day (QID) | RESPIRATORY_TRACT | 2 refills | Status: DC | PRN

## 2018-08-25 NOTE — ED Notes (Signed)
Pt verbalized understanding of d/c instructions, RX, and f/u care. No further questions at this time. Pt ambulatory to the exit with steady gait  

## 2018-08-25 NOTE — ED Triage Notes (Signed)
Pt was sent by PCP. Pt had PNA 08/07/18 and daugther had it this pasted weekend. Pt denies fever just SHOB. Pt is talking in complete sentences and NAD at this time.
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
## 2018-08-25 NOTE — ED Notes (Signed)
At pt bedside. Mask in place. Assessment completed. Placed on cardiac monitor, SPo2 and NIBP monitoring.

## 2018-08-25 NOTE — Telephone Encounter (Signed)
Pt went to [facility] in [place] on [address] March 1st   Pt thinks he was exposed to flu   Feels bad still   Pt is c/o:  Chills Muscle aches  Runny nose  Cough - cough has gotten better  SOB Headache  Chest pressure   Daughter was Dx with flu

## 2018-08-25 NOTE — Telephone Encounter (Signed)
Pt went to the ED today as advised to do so by PCP earlier this morning. Pt appt was canceled with [place] and pt went to ED.

## 2018-08-31 ENCOUNTER — Encounter: Payer: Self-pay | Admitting: Family Medicine

## 2018-08-31 ENCOUNTER — Ambulatory Visit (INDEPENDENT_AMBULATORY_CARE_PROVIDER_SITE_OTHER): Payer: PPO | Admitting: Family Medicine

## 2018-08-31 ENCOUNTER — Other Ambulatory Visit: Payer: Self-pay

## 2018-08-31 DIAGNOSIS — J989 Respiratory disorder, unspecified: Secondary | ICD-10-CM | POA: Insufficient documentation

## 2018-08-31 NOTE — Assessment & Plan Note (Addendum)
Patient has improved significantly.  Benign exam today.  He will monitor for any recurrence of symptoms.  Patient had follow-up x-ray in the ED without signs of pneumonia.  I do not feel that he needs a repeat follow-up x-ray.

## 2018-08-31 NOTE — Patient Instructions (Addendum)
Nice to see you. I am glad you are feeling better. Please monitor for recurrence of symptoms.  If you have recurrence please be evaluated.  Birthday

## 2018-08-31 NOTE — Progress Notes (Signed)
  Tommi Rumps, MD Phone: [phone]  Karl Myers is a 73 y.o. male who presents today for f/u.  Respiratory illness: Patient went to urgent care and was diagnosed with pneumonia.  His x-ray revealed interstitial prominence and possible developing pneumonia.  He was treated with Augmentin and azithromycin.  He did improve to some degree though then developed upper respiratory symptoms after being exposed to his daughter who had influenza.  He had some shortness of breath and chest tightness with cough.  He was evaluated in the emergency room and treated for a COPD exacerbation with prednisone and an inhaler.  He notes he feels completely better.  He has had no shortness of breath, cough, or chest tightness.  No travel or coronavirus exposure.  He split wood yesterday and felt well.  He does use the albuterol inhaler twice daily though does not think he needs this currently.  Social History   Tobacco Use  Smoking Status Current Every Day Smoker  . Packs/day: 1.00  . Last attempt to quit: 06/26/2017  . Years since quitting: 1.1  Smokeless Tobacco Never Used     ROS see history of present illness  Objective  Physical Exam Vitals:   08/31/18 1126  BP: 140/78  Pulse: 64  Temp: 97.9 F (36.6 C)  SpO2: 96%    BP Readings from Last 3 Encounters:  08/31/18 140/78  08/25/18 (!) 109/59  06/17/18 138/80   Wt Readings from Last 3 Encounters:  08/31/18 174 lb (78.9 kg)  08/25/18 172 lb (78 kg)  06/17/18 171 lb 6.4 oz (77.7 kg)    Physical Exam Constitutional:      General: He is not in acute distress.    Appearance: He is not diaphoretic.  HENT:     Head: Normocephalic and atraumatic.  Cardiovascular:     Rate and Rhythm: Normal rate and regular rhythm.     Heart sounds: Normal heart sounds.  Pulmonary:     Effort: Pulmonary effort is normal.     Breath sounds: Normal breath sounds.  Musculoskeletal:     Right lower leg: No edema.     Left lower leg: No edema.   Skin:    General: Skin is warm and dry.  Neurological:     Mental Status: He is alert.      Assessment/Plan: Please see individual problem list.  Respiratory illness Patient has improved significantly.  Benign exam today.  He will monitor for any recurrence of symptoms.  Patient had follow-up x-ray in the ED without signs of pneumonia.  I do not feel that he needs a repeat follow-up x-ray.   No orders of the defined types were placed in this encounter.   No orders of the defined types were placed in this encounter.    Tommi Rumps, MD [place]

## 2018-09-20 ENCOUNTER — Telehealth: Payer: Self-pay | Admitting: Cardiovascular Disease

## 2018-09-20 NOTE — Telephone Encounter (Signed)
Attempted to schedule fu from recall.  Patient audibly agitated and states he does not want an appt as he does not see "that doctor"    Patient declined appt. Deleting recall per patient suggestion.

## 2018-09-21 NOTE — Telephone Encounter (Signed)
Perhaps put in recall 6 months form now

## 2018-09-22 NOTE — Telephone Encounter (Signed)
Recall entered  °

## 2018-10-11 ENCOUNTER — Other Ambulatory Visit: Payer: Self-pay | Admitting: Family Medicine

## 2018-10-11 NOTE — Telephone Encounter (Signed)
Last OV 08/31/2018  Last refilled  ALPRAZolam (XANAX) 0.5 MG tablet 45 tablet 1 08/16/2018     Next OV none scheduled   Sent to PCP for approval

## 2018-11-12 ENCOUNTER — Other Ambulatory Visit: Payer: Self-pay | Admitting: Family Medicine

## 2018-11-17 NOTE — Telephone Encounter (Signed)
Pt called stating [facility] advised him to f/u with office regarding RX refill on alprazolam. They did not get a response. Pt is leaving town Saturday and will be gone for a week. Pt requesting this be sent.

## 2018-11-18 ENCOUNTER — Telehealth: Payer: Self-pay

## 2018-11-18 MED ORDER — ALPRAZOLAM 0.5 MG PO TABS: ORAL_TABLET | ORAL | 1 refills | Status: DC

## 2018-11-18 NOTE — Telephone Encounter (Signed)
Sent to pharmacy 

## 2018-11-21 NOTE — Telephone Encounter (Signed)
Left a voicemail informing the pt that the RX for Xanax was sent to pharmacy.  Marisella Puccio,cma

## 2018-11-28 ENCOUNTER — Other Ambulatory Visit: Payer: Self-pay | Admitting: Family Medicine

## 2018-12-14 ENCOUNTER — Ambulatory Visit (INDEPENDENT_AMBULATORY_CARE_PROVIDER_SITE_OTHER): Payer: PPO | Admitting: Family Medicine

## 2018-12-14 ENCOUNTER — Encounter: Payer: Self-pay | Admitting: Family Medicine

## 2018-12-14 ENCOUNTER — Other Ambulatory Visit: Payer: Self-pay

## 2018-12-14 DIAGNOSIS — G5601 Carpal tunnel syndrome, right upper limb: Secondary | ICD-10-CM | POA: Diagnosis not present

## 2018-12-14 DIAGNOSIS — E782 Mixed hyperlipidemia: Secondary | ICD-10-CM | POA: Diagnosis not present

## 2018-12-14 DIAGNOSIS — I10 Essential (primary) hypertension: Secondary | ICD-10-CM | POA: Diagnosis not present

## 2018-12-14 NOTE — Assessment & Plan Note (Signed)
-  Continue Lipitor °

## 2018-12-14 NOTE — Assessment & Plan Note (Signed)
Symptoms are most consistent with carpal tunnel though he may also have trigger finger.  We will get him referred to orthopedics.

## 2018-12-14 NOTE — Progress Notes (Signed)
Virtual Visit via telephone Note  This visit type was conducted due to national recommendations for restrictions regarding the COVID-19 pandemic (e.g. social distancing).  This format is felt to be most appropriate for this patient at this time.  All issues noted in this document were discussed and addressed.  No physical exam was performed (except for noted visual exam findings with Video Visits).   I connected with Delane Ginger today at 11:00 AM EDT by telephone and verified that I am speaking with the correct person using two identifiers. Location patient: home Location provider: home office Persons participating in the virtual visit: patient, provider, Saahas Hidrogo (wife)  I discussed the limitations, risks, security and privacy concerns of performing an evaluation and management service by telephone and the availability of in person appointments. I also discussed with the patient that there may be a patient responsible charge related to this service. The patient expressed understanding and agreed to proceed.  Interactive audio and video telecommunications were attempted between this provider and patient, however failed, due to patient having technical difficulties OR patient did not have access to video capability.  We continued and completed visit with audio only.  Reason for visit: follow-up  HPI: HYPERTENSION  Disease Monitoring  Home BP Monitoring not checking though has been well controlled in the office. Chest pain-no.    Dyspnea-no. Medications  Compliance-taking lisinopril, atenolol.   Edema-no.  HYPERLIPIDEMIA Symptoms Chest pain on exertion: No    Medications: Compliance-taking Lipitor. Right upper quadrant pain-no. Muscle aches-no.  Carpal tunnel syndrome: Patient notes this is occurring in his right hand.  He had surgery about a year ago though it has not been right since then.  He has numbness and tingling in his hand in the palmar aspect that occurs most of the time  though worsens at night.  He notes his thumb will stay flexed when he flexes it and he has difficulty getting it extended again.  Occasionally he has the numbness and tingling up towards his elbow.  No weakness.  He does note pain related to this.    ROS: See pertinent positives and negatives per HPI.  Past Medical History:  Diagnosis Date  . Arthritis   . COPD (chronic obstructive pulmonary disease) ([place])   . DDD (degenerative disc disease), cervical   . DDD (degenerative disc disease), cervical   . Depression   . Hypertension   . Pre-diabetes     Past Surgical History:  Procedure Laterality Date  . BACK SURGERY     x 3  . CARPAL TUNNEL RELEASE Right 08/19/2017   Procedure: CARPAL TUNNEL RELEASE;  Surgeon: Hessie Knows, MD;  Location: [facility] ORS;  Service: Orthopedics;  Laterality: Right;  . CATARACT EXTRACTION W/PHACO Right 11/17/2016   Procedure: CATARACT EXTRACTION PHACO AND INTRAOCULAR LENS PLACEMENT (IOC);  Surgeon: Birder Robson, MD;  Location: [facility] ORS;  Service: Ophthalmology;  Laterality: Right;  [place] 00:37 AP% 12.6 CDE 4.69 Fluid pack lot # [id number]  . CATARACT EXTRACTION W/PHACO Left 12/08/2016   Procedure: CATARACT EXTRACTION PHACO AND INTRAOCULAR LENS PLACEMENT (IOC);  Surgeon: Birder Robson, MD;  Location: [facility] ORS;  Service: Ophthalmology;  Laterality: Left;  [place] 00:33 AP% 13.9 CDE 4.63 Fluid pack lot # [id number]  . ULNAR TUNNEL RELEASE Right 08/19/2017   Procedure: CUBITAL TUNNEL RELEASE;  Surgeon: Hessie Knows, MD;  Location: [facility] ORS;  Service: Orthopedics;  Laterality: Right;    Family History  Problem Relation Age of Onset  . Heart attack Mother   .  Prostate cancer Father   . Breast cancer Sister   . Dementia Sister   . Cancer Brother        unknown what kind    SOCIAL HX: Smoker   Current Outpatient Medications:  .  albuterol (PROVENTIL HFA;VENTOLIN HFA) 108 (90 Base) MCG/ACT inhaler, Inhale 2 puffs into the lungs every 6 (six) hours as needed  for wheezing or shortness of breath., Disp: 1 Inhaler, Rfl: 2 .  ALPRAZolam (XANAX) 0.5 MG tablet, TAKE 1/2 TABLET BY MOUTH EVERY MORNING AND 1 TABLET AT BEDTIME AS DIRECTED, Disp: 45 tablet, Rfl: 1 .  atenolol (TENORMIN) 25 MG tablet, TAKE ONE TABLET AT BEDTIME, Disp: 90 tablet, Rfl: 3 .  atorvastatin (LIPITOR) 40 MG tablet, Take 1 tablet (40 mg total) by mouth daily., Disp: 90 tablet, Rfl: 3 .  fluticasone (FLONASE) 50 MCG/ACT nasal spray, Place 2 sprays into both nostrils daily., Disp: 16 g, Rfl: 6 .  lisinopril (ZESTRIL) 10 MG tablet, TAKE ONE TABLET AT BEDTIME, Disp: 90 tablet, Rfl: 3 .  nitroGLYCERIN (NITROSTAT) 0.4 MG SL tablet, Place 0.4 mg under the tongue every 5 (five) minutes x 3 doses as needed. For chest pain., Disp: , Rfl:  .  predniSONE (DELTASONE) 50 MG tablet, Take 1 tablet (50 mg total) by mouth daily with breakfast., Disp: 5 tablet, Rfl: 0 .  traMADol (ULTRAM) 50 MG tablet, Take 50 mg by mouth as needed., Disp: , Rfl:  .  traZODone (DESYREL) 150 MG tablet, TAKE ONE TABLET AT BEDTIME, Disp: 90 tablet, Rfl: 1  Current Facility-Administered Medications:  .  oxyCODONE (Oxy IR/ROXICODONE) immediate release tablet 5 mg, 5 mg, Oral, Q3H PRN, Hessie Knows, MD  EXAM: This was a telehealth telephone visit and thus no physical exam was completed.  ASSESSMENT AND PLAN:  Discussed the following assessment and plan:  Hypertension Previously adequately controlled.  He will continue his current regimen.  Hyperlipidemia Continue Lipitor.  Carpal tunnel syndrome of right wrist Symptoms are most consistent with carpal tunnel though he may also have trigger finger.  We will get him referred to orthopedics.    I discussed the assessment and treatment plan with the patient. The patient was provided an opportunity to ask questions and all were answered. The patient agreed with the plan and demonstrated an understanding of the instructions.   The patient was advised to call back or seek  an in-person evaluation if the symptoms worsen or if the condition fails to improve as anticipated.  I provided 14 minutes of non-face-to-face time during this encounter.   Tommi Rumps, MD

## 2018-12-14 NOTE — Assessment & Plan Note (Signed)
Previously adequately controlled.  He will continue his current regimen.

## 2018-12-20 ENCOUNTER — Other Ambulatory Visit: Payer: Self-pay | Admitting: Family Medicine

## 2018-12-27 DIAGNOSIS — M7711 Lateral epicondylitis, right elbow: Secondary | ICD-10-CM | POA: Diagnosis not present

## 2018-12-27 DIAGNOSIS — M189 Osteoarthritis of first carpometacarpal joint, unspecified: Secondary | ICD-10-CM | POA: Diagnosis not present

## 2019-02-02 DIAGNOSIS — M5136 Other intervertebral disc degeneration, lumbar region: Secondary | ICD-10-CM | POA: Diagnosis not present

## 2019-02-02 DIAGNOSIS — M48062 Spinal stenosis, lumbar region with neurogenic claudication: Secondary | ICD-10-CM | POA: Diagnosis not present

## 2019-02-02 DIAGNOSIS — M5416 Radiculopathy, lumbar region: Secondary | ICD-10-CM | POA: Diagnosis not present

## 2019-02-03 ENCOUNTER — Ambulatory Visit: Payer: PPO

## 2019-02-10 ENCOUNTER — Ambulatory Visit: Payer: PPO

## 2019-02-17 ENCOUNTER — Telehealth: Payer: Self-pay

## 2019-02-17 ENCOUNTER — Ambulatory Visit (INDEPENDENT_AMBULATORY_CARE_PROVIDER_SITE_OTHER): Payer: PPO

## 2019-02-17 DIAGNOSIS — Z0000 Encounter for general adult medical examination without abnormal findings: Secondary | ICD-10-CM

## 2019-02-17 NOTE — Telephone Encounter (Signed)
Requests refill on xanax due to no refill on file and nitrostat due to expiration. Send to [facility] (pharmacy on file). Last visit with pcp 12/14/18 via doxy.

## 2019-02-17 NOTE — Progress Notes (Signed)
Subjective:   Karl Myers is a 73 y.o. male who presents for Medicare Annual/Subsequent preventive examination.  Review of Systems:  No ROS.  Medicare Wellness Virtual Visit.  Visual/audio telehealth visit, UTA vital signs.   See social history for additional risk factors.   Cardiac Risk Factors include: advanced age (>25men, >36 women);hypertension;male gender     Objective:    Vitals: There were no vitals taken for this visit.  There is no height or weight on file to calculate BMI.  Advanced Directives 02/17/2019 08/25/2018 02/02/2018 08/19/2017 12/08/2016  Does Patient Have a Medical Advance Directive? Yes No Yes No No  Type of Paramedic of [place];Living will - [place];Living will - -  Does patient want to make changes to medical advance directive? No - Patient declined - No - Patient declined - -  Copy of [place] in Chart? Yes - validated most recent copy scanned in chart (See row information) - Yes - -  Would patient like information on creating a medical advance directive? - No - Patient declined - No - Patient declined No - Patient declined    Tobacco Social History   Tobacco Use  Smoking Status Current Every Day Smoker  . Packs/day: 1.00  . Last attempt to quit: 06/26/2017  . Years since quitting: 1.6  Smokeless Tobacco Never Used     Ready to quit: Not Answered Counseling given: Not Answered   Clinical Intake:  Pre-visit preparation completed: Yes        Diabetes: No  How often do you need to have someone help you when you read instructions, pamphlets, or other written materials from your doctor or pharmacy?: 1 - Never  Interpreter Needed?: No     Past Medical History:  Diagnosis Date  . Arthritis   . COPD (chronic obstructive pulmonary disease) ([place])   . DDD (degenerative disc disease), cervical   . DDD (degenerative disc disease), cervical   . Depression   . Hypertension    . Pre-diabetes    Past Surgical History:  Procedure Laterality Date  . BACK SURGERY     x 3  . CARPAL TUNNEL RELEASE Right 08/19/2017   Procedure: CARPAL TUNNEL RELEASE;  Surgeon: Hessie Knows, MD;  Location: [facility] ORS;  Service: Orthopedics;  Laterality: Right;  . CATARACT EXTRACTION W/PHACO Right 11/17/2016   Procedure: CATARACT EXTRACTION PHACO AND INTRAOCULAR LENS PLACEMENT (IOC);  Surgeon: Birder Robson, MD;  Location: [facility];  Service: Ophthalmology;  Laterality: Right;  [place] 00:37 AP% 12.6 CDE 4.69 Fluid pack lot # [id number]  . CATARACT EXTRACTION W/PHACO Left 12/08/2016   Procedure: CATARACT EXTRACTION PHACO AND INTRAOCULAR LENS PLACEMENT (IOC);  Surgeon: Birder Robson, MD;  Location: [facility];  Service: Ophthalmology;  Laterality: Left;  [place] 00:33 AP% 13.9 CDE 4.63 Fluid pack lot # [id number]  . ULNAR TUNNEL RELEASE Right 08/19/2017   Procedure: CUBITAL TUNNEL RELEASE;  Surgeon: Hessie Knows, MD;  Location: [facility];  Service: Orthopedics;  Laterality: Right;   Family History  Problem Relation Age of Onset  . Heart attack Mother   . Prostate cancer Father   . Breast cancer Sister   . Dementia Sister   . Cancer Brother        unknown what kind   Social History   Socioeconomic History  . Marital status: Married    Spouse name: Not on file  . Number of children: Not on file  . Years of education: Not  on file  . Highest education level: Not on file  Occupational History  . Not on file  Social Needs  . Financial resource strain: Not hard at all  . Food insecurity    Worry: Never true    Inability: Never true  . Transportation needs    Medical: No    Non-medical: No  Tobacco Use  . Smoking status: Current Every Day Smoker    Packs/day: 1.00    Last attempt to quit: 06/26/2017    Years since quitting: 1.6  . Smokeless tobacco: Never Used  Substance and Sexual Activity  . Alcohol use: No  . Drug use: No  . Sexual activity: Not on file  Lifestyle  . Physical  activity    Days per week: 0 days    Minutes per session: Not on file  . Stress: Not at all  Relationships  . Social Herbalist on phone: Not on file    Gets together: Not on file    Attends religious service: Not on file    Active member of club or organization: Not on file    Attends meetings of clubs or organizations: Not on file    Relationship status: Not on file  Other Topics Concern  . Not on file  Social History Narrative  . Not on file    Outpatient Encounter Medications as of 02/17/2019  Medication Sig  . albuterol (PROVENTIL HFA;VENTOLIN HFA) 108 (90 Base) MCG/ACT inhaler Inhale 2 puffs into the lungs every 6 (six) hours as needed for wheezing or shortness of breath.  . ALPRAZolam (XANAX) 0.5 MG tablet TAKE 1/2 TABLET BY MOUTH EVERY MORNING AND 1 TABLET AT BEDTIME AS DIRECTED  . atenolol (TENORMIN) 25 MG tablet TAKE ONE TABLET AT BEDTIME  . atorvastatin (LIPITOR) 40 MG tablet Take 1 tablet (40 mg total) by mouth daily.  . fluticasone (FLONASE) 50 MCG/ACT nasal spray Place 2 sprays into both nostrils daily.  Marland Kitchen lisinopril (ZESTRIL) 10 MG tablet TAKE ONE TABLET AT BEDTIME  . nitroGLYCERIN (NITROSTAT) 0.4 MG SL tablet Place 0.4 mg under the tongue every 5 (five) minutes x 3 doses as needed. For chest pain.  . predniSONE (DELTASONE) 50 MG tablet Take 1 tablet (50 mg total) by mouth daily with breakfast.  . traMADol (ULTRAM) 50 MG tablet Take 50 mg by mouth as needed.  . traZODone (DESYREL) 150 MG tablet TAKE ONE TABLET AT BEDTIME   Facility-Administered Encounter Medications as of 02/17/2019  Medication  . oxyCODONE (Oxy IR/ROXICODONE) immediate release tablet 5 mg    Activities of Daily Living In your present state of health, do you have any difficulty performing the following activities: 02/17/2019  Hearing? N  Vision? N  Difficulty concentrating or making decisions? N  Walking or climbing stairs? N  Dressing or bathing? N  Doing errands, shopping? N   Preparing Food and eating ? N  Using the Toilet? N  In the past six months, have you accidently leaked urine? N  Do you have problems with loss of bowel control? N  Managing your Medications? N  Managing your Finances? N  Housekeeping or managing your Housekeeping? N  Some recent data might be hidden    Patient Care Team: Leone Haven, MD as PCP - General (Family Medicine)   Assessment:   This is a routine wellness examination for [place].  I connected with patient 02/17/19 at  9:30 AM EDT by an audio enabled telemedicine application and verified that I  am speaking with the correct person using two identifiers. Patient stated full name and DOB. Patient gave permission to continue with virtual visit. Patient's location was at home and Nurse's location was at [place] office.   Health Maintenance Due: Influenza vaccine 2020- discussed; to be completed in season with doctor or local pharmacy.   Update all pending maintenance due as appropriate.   See completed HM at the end of note.   Eye: Visual acuity not assessed. Virtual visit. Wears corrective lenses. Followed by their ophthalmologist.    Dental: UTD  Hearing: Demonstrates normal hearing during visit.  Safety:  Patient feels safe at home- yes Patient does have smoke detectors at home- yes Patient does wear sunscreen or protective clothing when in direct sunlight - yes Patient does wear seat belt when in a moving vehicle - yes Patient drives- yes Adequate lighting in walkways free from debris- yes Grab bars and handrails used as appropriate- yes Ambulates with no assistive device Cell phone on person when ambulating outside of the home- yes  Social: Alcohol intake - no      Smoking history- current Illicit drug use? none  Depression: PHQ 2 &9 complete. See screening below. Denies irritability, anhedonia, sadness/tearfullness.  Stable.   Falls: See screening below.    Medication: Taking as directed and  without issues.   Covid-19: Precautions and sickness symptoms discussed. Wears mask, social distancing, hand hygiene as appropriate.   Activities of Daily Living Patient denies needing assistance with: household chores, feeding themselves, getting from bed to chair, getting to the toilet, bathing/showering, dressing, managing money, or preparing meals.   Memory: Patient is alert. Patient denies difficulty focusing or concentrating. Correctly identified the president of the [place], season and recall. Patient likes to read the newspaper for brain stimulation.  BMI- discussed the importance of a healthy diet, water intake and the benefits of aerobic exercise.  Educational material provided.  Physical activity- none  Diet:  Regular Water: Admits he can do better Caffeine: 6 cups of coffee/tea  Advanced Directive: End of life planning; Advance aging; Advanced directives discussed.  Copy of current HCPOA/Living Will on file.    Other Providers Patient Care Team: Leone Haven, MD as PCP - General (Family Medicine)  Exercise Activities and Dietary recommendations    Goals      Patient Stated   . DIET - INCREASE WATER INTAKE (pt-stated)       Fall Risk Fall Risk  02/17/2019 06/17/2018 02/02/2018 03/12/2017  Falls in the past year? 0 0 No No  Number falls in past yr: - 0 - -  Injury with Fall? - 0 - -   Timed Get Up and Go Performed: no, virtual visit  Depression Screen PHQ 2/9 Scores 02/17/2019 06/17/2018 02/02/2018 03/12/2017  PHQ - 2 Score 0 0 0 0    Cognitive Function MMSE - Mini Mental State Exam 02/02/2018  Orientation to time 5  Orientation to Place 5  Registration 3  Attention/ Calculation 5  Recall 3  Language- name 2 objects 2  Language- repeat 1  Language- follow 3 step command 3  Language- read & follow direction 1  Write a sentence 1  Copy design 1  Total score 30     6CIT Screen 02/17/2019  What Year? 0 points  What month? 0 points  What time? 0  points  Count back from 20 0 points  Months in reverse 0 points  Repeat phrase 0 points  Total Score 0  Immunization History  Administered Date(s) Administered  . Influenza, High Dose Seasonal PF 03/12/2017   Screening Tests Health Maintenance  Topic Date Due  . INFLUENZA VACCINE  09/06/2019 (Originally 01/07/2019)  . TETANUS/TDAP  02/17/2020 (Originally 08/30/1964)  . Fecal DNA (Cologuard)  02/17/2020 (Originally 08/31/1995)  . PNA vac Low Risk Adult (1 of 2 - PCV13) 02/17/2020 (Originally 08/31/2010)  . Hepatitis C Screening  Completed      Plan:    Keep all routine maintenance appointments.   Flu shot scheduled 02/20/19 @ 3:00, nurse visit  Medicare Attestation I have personally reviewed: The patient's medical and social history Their use of alcohol, tobacco or illicit drugs Their current medications and supplements The patient's functional ability including ADLs,fall risks, home safety risks, cognitive, and hearing and visual impairment Diet and physical activities Evidence for depression   In addition, I have reviewed and discussed with patient certain preventive protocols, quality metrics, and best practice recommendations. A written personalized care plan for preventive services as well as general preventive health recommendations were provided to patient via mail.     Varney Biles, LPN  [id number]

## 2019-02-17 NOTE — Patient Instructions (Addendum)
  Karl Myers , Thank you for taking time to come for your Medicare Wellness Visit. I appreciate your ongoing commitment to your health goals. Please review the following plan we discussed and let me know if I can assist you in the future.   These are the goals we discussed: Goals    . Try to stop smoking again       This is a list of the screening recommended for you and due dates:  Health Maintenance  Topic Date Due  . Flu Shot  09/06/2019*  . Tetanus Vaccine  02/17/2020*  . Cologuard (Stool DNA test)  02/17/2020*  . Pneumonia vaccines (1 of 2 - PCV13) 02/17/2020*  .  Hepatitis C: One time screening is recommended by Center for Disease Control  (CDC) for  adults born from [birth year] through 1965.   Completed  *Topic was postponed. The date shown is not the original due date.

## 2019-02-20 ENCOUNTER — Other Ambulatory Visit: Payer: Self-pay | Admitting: Family Medicine

## 2019-02-20 ENCOUNTER — Other Ambulatory Visit: Payer: Self-pay

## 2019-02-20 ENCOUNTER — Ambulatory Visit (INDEPENDENT_AMBULATORY_CARE_PROVIDER_SITE_OTHER): Payer: PPO

## 2019-02-20 DIAGNOSIS — Z23 Encounter for immunization: Secondary | ICD-10-CM | POA: Diagnosis not present

## 2019-02-20 DIAGNOSIS — E785 Hyperlipidemia, unspecified: Secondary | ICD-10-CM

## 2019-02-21 MED ORDER — NITROGLYCERIN 0.4 MG SL SUBL: 0.4000 mg | SUBLINGUAL_TABLET | SUBLINGUAL | 0 refills | Status: AC | PRN

## 2019-02-28 NOTE — Telephone Encounter (Signed)
Refill done and sent to pharmacy.  Travus Oren,cma

## 2019-03-02 DIAGNOSIS — M5416 Radiculopathy, lumbar region: Secondary | ICD-10-CM | POA: Diagnosis not present

## 2019-03-02 DIAGNOSIS — M5136 Other intervertebral disc degeneration, lumbar region: Secondary | ICD-10-CM | POA: Diagnosis not present

## 2019-03-02 DIAGNOSIS — M48062 Spinal stenosis, lumbar region with neurogenic claudication: Secondary | ICD-10-CM | POA: Diagnosis not present

## 2019-03-24 ENCOUNTER — Other Ambulatory Visit: Payer: Self-pay | Admitting: Family Medicine

## 2019-03-28 NOTE — Progress Notes (Signed)
Cardiology Office Note  Date:  03/29/2019   ID:  Karl Myers [birth year], MRN [id number]  PCP:  Karl Haven, MD   Chief Complaint  Patient presents with  . other    12 month follow up. Meds reviewed by the pt. verbally. "doing well." Pt. c/o BP dropped yesterday 100/60  while washing his car.     HPI:  [name] is a 73 year old gentleman with past medical history of HTN Smoker, quit last month.  Smoked for 50 years or more Anxiety Coronary artery disease on previous CT scan Fatigue, with exertion, Who presents for f/u of his fatigue, coronary artery disease  Reports feeling well, denies any chest pain or shortness of breath Started smoking again 1 ppd Does not want chantix  Stress test 07/30/2017, reviewed with him in detail  There was no ST segment deviation noted during stress.  The study is normal.  This is a low risk study.  The left ventricular ejection fraction is normal (55-65%).   Active, works in the garden  Previous lab work reviewed with him in detail Total chol 137, LDL 82 HBA1C 6.0  EKG personally reviewed by myself on todays visit Shows normal sinus rhythm rate 60 bpm no significant ST or T wave changes   PMH:   has a past medical history of Arthritis, COPD (chronic obstructive pulmonary disease) ([place]), DDD (degenerative disc disease), cervical, DDD (degenerative disc disease), cervical, Depression, Hypertension, and Pre-diabetes.  PSH:    Past Surgical History:  Procedure Laterality Date  . BACK SURGERY     x 3  . CARPAL TUNNEL RELEASE Right 08/19/2017   Procedure: CARPAL TUNNEL RELEASE;  Surgeon: Hessie Knows, MD;  Location: [facility];  Service: Orthopedics;  Laterality: Right;  . CATARACT EXTRACTION W/PHACO Right 11/17/2016   Procedure: CATARACT EXTRACTION PHACO AND INTRAOCULAR LENS PLACEMENT (IOC);  Surgeon: Birder Robson, MD;  Location: [facility];  Service: Ophthalmology;  Laterality: Right;  [place] 00:37 AP% 12.6 CDE  4.69 Fluid pack lot # [id number]  . CATARACT EXTRACTION W/PHACO Left 12/08/2016   Procedure: CATARACT EXTRACTION PHACO AND INTRAOCULAR LENS PLACEMENT (IOC);  Surgeon: Birder Robson, MD;  Location: [facility];  Service: Ophthalmology;  Laterality: Left;  [place] 00:33 AP% 13.9 CDE 4.63 Fluid pack lot # [id number]  . ULNAR TUNNEL RELEASE Right 08/19/2017   Procedure: CUBITAL TUNNEL RELEASE;  Surgeon: Hessie Knows, MD;  Location: [facility];  Service: Orthopedics;  Laterality: Right;    Current Outpatient Medications  Medication Sig Dispense Refill  . ALPRAZolam (XANAX) 0.5 MG tablet TAKE ONE-HALF TABLET BY MOUTH EVERY MORNING AND TAKE ONE TABLET BY MOUTH AT BEDTIME AS DIRECTED 45 tablet 1  . atenolol (TENORMIN) 25 MG tablet TAKE ONE TABLET AT BEDTIME 90 tablet 3  . atorvastatin (LIPITOR) 40 MG tablet TAKE ONE TABLET BY MOUTH EVERY DAY 90 tablet 3  . lisinopril (ZESTRIL) 10 MG tablet TAKE ONE TABLET AT BEDTIME 90 tablet 3  . nitroGLYCERIN (NITROSTAT) 0.4 MG SL tablet Place 1 tablet (0.4 mg total) under the tongue every 5 (five) minutes as needed for chest pain. For up to 3 doses per episode. 50 tablet 0  . traZODone (DESYREL) 150 MG tablet TAKE ONE TABLET AT BEDTIME 90 tablet 1  . traMADol (ULTRAM) 50 MG tablet Take 50 mg by mouth as needed.     Current Facility-Administered Medications  Medication Dose Route Frequency Provider Last Rate Last Dose  . oxyCODONE (Oxy IR/ROXICODONE) immediate release tablet 5 mg  5  mg Oral Q3H PRN Hessie Knows, MD         Allergies:   Hydrocodone-acetaminophen and Neurontin [gabapentin]   Social History:  The patient  reports that he has been smoking. He has been smoking about 1.00 pack per day. He has never used smokeless tobacco. He reports that he does not drink alcohol or use drugs.   Family History:   family history includes Breast cancer in his sister; Cancer in his brother; Dementia in his sister; Heart attack in his mother; Prostate cancer in his father.     Review of Systems: Review of Systems  Constitutional: Negative.   HENT: Negative.   Respiratory: Negative.   Cardiovascular: Negative.   Gastrointestinal: Negative.   Musculoskeletal: Negative.   Neurological: Negative.   Psychiatric/Behavioral: Negative.   All other systems reviewed and are negative.   PHYSICAL EXAM: VS:  BP 130/68 (BP Location: Left Arm, Patient Position: Sitting, Cuff Size: Normal)   Pulse 60   Temp (!) 97.2 F (36.2 C)   Ht 5\' 8"  (1.727 m)   Wt 168 lb 8 oz (76.4 kg)   BMI 25.62 kg/m  , BMI Body mass index is 25.62 kg/m. Constitutional:  oriented to person, place, and time. No distress.  HENT:  Head: Grossly normal Eyes:  no discharge. No scleral icterus.  Neck: No JVD, no carotid bruits  Cardiovascular: Regular rate and rhythm, no murmurs appreciated Pulmonary/Chest: Clear to auscultation bilaterally, no wheezes or rails Abdominal: Soft.  no distension.  no tenderness.  Musculoskeletal: Normal range of motion Neurological:  normal muscle tone. Coordination normal. No atrophy Skin: Skin warm and dry Psychiatric: normal affect, pleasant  Recent Labs: 08/25/2018: ALT 15; BUN 16; Creatinine, Ser 0.92; Hemoglobin 15.1; Platelets 249; Potassium 4.3; Sodium 139    Lipid Panel Lab Results  Component Value Date   CHOL 137 06/17/2018   HDL 36 (L) 06/17/2018   LDLCALC 75 06/17/2018   TRIG 165 (H) 06/17/2018      Wt Readings from Last 3 Encounters:  03/29/19 168 lb 8 oz (76.4 kg)  08/31/18 174 lb (78.9 kg)  08/25/18 172 lb (78 kg)      ASSESSMENT AND PLAN:  Coronary artery disease of native artery of native heart with stable angina pectoris (HCC) Significant coronary disease seen on CT scan 50 years of smoking, continues to smoke Stressed importance of aggressive lipid control, smoking cessation Stress test 1 year ago no significant ischemia  Chronic fatigue -  Prior stress test with no ischemia 2019 Recommended smoking cessation,  walking program  Tobacco abuse Restarted smoking, does not want Chantix Long discussion with him, need to quit smoking  Essential hypertension - Plan: EKG 12-Lead Blood pressure well controlled, no changes made  Mixed hyperlipidemia -  Cholesterol at goal   Disposition:   F/U 12 months   Total encounter time more than 25 minutes  Greater than 50% was spent in counseling and coordination of care with the patient    Orders Placed This Encounter  Procedures  . EKG 12-Lead     Signed, Esmond Plants, M.D., Ph.D. 03/29/2019  [place], [place]

## 2019-03-29 ENCOUNTER — Other Ambulatory Visit: Payer: Self-pay

## 2019-03-29 ENCOUNTER — Encounter: Payer: Self-pay | Admitting: Cardiovascular Disease

## 2019-03-29 ENCOUNTER — Ambulatory Visit (INDEPENDENT_AMBULATORY_CARE_PROVIDER_SITE_OTHER): Payer: PPO | Admitting: Cardiovascular Disease

## 2019-03-29 VITALS — BP 130/68 | HR 60 | Temp 97.2°F | Ht 68.0 in | Wt 168.5 lb

## 2019-03-29 DIAGNOSIS — Z720 Tobacco use: Secondary | ICD-10-CM

## 2019-03-29 DIAGNOSIS — I10 Essential (primary) hypertension: Secondary | ICD-10-CM | POA: Diagnosis not present

## 2019-03-29 DIAGNOSIS — R5382 Chronic fatigue, unspecified: Secondary | ICD-10-CM | POA: Diagnosis not present

## 2019-03-29 DIAGNOSIS — E782 Mixed hyperlipidemia: Secondary | ICD-10-CM | POA: Diagnosis not present

## 2019-03-29 DIAGNOSIS — I25118 Atherosclerotic heart disease of native coronary artery with other forms of angina pectoris: Secondary | ICD-10-CM

## 2019-03-29 NOTE — Patient Instructions (Signed)

## 2019-05-19 ENCOUNTER — Telehealth: Payer: Self-pay | Admitting: Family Medicine

## 2019-05-19 NOTE — Telephone Encounter (Signed)
I called pt twice and left vm to call ofc. °

## 2019-05-22 ENCOUNTER — Ambulatory Visit (INDEPENDENT_AMBULATORY_CARE_PROVIDER_SITE_OTHER): Payer: PPO | Admitting: Family Medicine

## 2019-05-22 ENCOUNTER — Other Ambulatory Visit: Payer: Self-pay

## 2019-05-22 ENCOUNTER — Encounter: Payer: Self-pay | Admitting: Family Medicine

## 2019-05-22 VITALS — Ht 68.0 in | Wt 168.0 lb

## 2019-05-22 DIAGNOSIS — L989 Disorder of the skin and subcutaneous tissue, unspecified: Secondary | ICD-10-CM | POA: Diagnosis not present

## 2019-05-22 DIAGNOSIS — N6320 Unspecified lump in the left breast, unspecified quadrant: Secondary | ICD-10-CM | POA: Diagnosis not present

## 2019-05-22 NOTE — Progress Notes (Signed)
Virtual Visit via video Note  This visit type was conducted due to national recommendations for restrictions regarding the COVID-19 pandemic (e.g. social distancing).  This format is felt to be most appropriate for this patient at this time.  All issues noted in this document were discussed and addressed.  No physical exam was performed (except for noted visual exam findings with Video Visits).   I connected with Karl Myers today at  9:00 AM EST by a video enabled telemedicine application and verified that I am speaking with the correct person using two identifiers. Location patient: home Location provider: work Persons participating in the virtual visit: patient, provider, Stokes Nastri (wife)  I discussed the limitations, risks, security and privacy concerns of performing an evaluation and management service by telephone and the availability of in person appointments. I also discussed with the patient that there may be a patient responsible charge related to this service. The patient expressed understanding and agreed to proceed.   Reason for visit: Same day visit.  HPI: Black spot on right little toe: Patient notes he noticed this 2 weeks ago.  They thought it was a blood blister and his wife worked on it and there was some bleeding though this did not seem to change the lesion.  Notes it is black.  Notes they applied new skin over top of it and that is why it appears callus-like today.  No injury.  No drainage.  No fevers.  No pain.  Left breast enlargement: Patient notes this is been going on for about 2 weeks.  His left breast appears larger than the right.  There is no specific palpable lesion to the patient.  Notes it is described as a fullness.  There is no nipple discharge.  There is no breast pain.  There is no tenderness.   ROS: See pertinent positives and negatives per HPI.  Past Medical History:  Diagnosis Date  . Arthritis   . COPD (chronic obstructive pulmonary disease)  ([place])   . DDD (degenerative disc disease), cervical   . DDD (degenerative disc disease), cervical   . Depression   . Hypertension   . Pre-diabetes     Past Surgical History:  Procedure Laterality Date  . BACK SURGERY     x 3  . CARPAL TUNNEL RELEASE Right 08/19/2017   Procedure: CARPAL TUNNEL RELEASE;  Surgeon: Hessie Knows, MD;  Location: [facility];  Service: Orthopedics;  Laterality: Right;  . CATARACT EXTRACTION W/PHACO Right 11/17/2016   Procedure: CATARACT EXTRACTION PHACO AND INTRAOCULAR LENS PLACEMENT (IOC);  Surgeon: Birder Robson, MD;  Location: [facility];  Service: Ophthalmology;  Laterality: Right;  [place] 00:37 AP% 12.6 CDE 4.69 Fluid pack lot # [id number]  . CATARACT EXTRACTION W/PHACO Left 12/08/2016   Procedure: CATARACT EXTRACTION PHACO AND INTRAOCULAR LENS PLACEMENT (IOC);  Surgeon: Birder Robson, MD;  Location: [facility];  Service: Ophthalmology;  Laterality: Left;  [place] 00:33 AP% 13.9 CDE 4.63 Fluid pack lot # [id number]  . ULNAR TUNNEL RELEASE Right 08/19/2017   Procedure: CUBITAL TUNNEL RELEASE;  Surgeon: Hessie Knows, MD;  Location: [facility];  Service: Orthopedics;  Laterality: Right;    Family History  Problem Relation Age of Onset  . Heart attack Mother   . Prostate cancer Father   . Breast cancer Sister   . Dementia Sister   . Cancer Brother        unknown what kind    SOCIAL HX: Smoker   Current Outpatient Medications:  .  ALPRAZolam (XANAX) 0.5 MG tablet, TAKE ONE-HALF TABLET BY MOUTH EVERY MORNING AND TAKE ONE TABLET BY MOUTH AT BEDTIME AS DIRECTED, Disp: 45 tablet, Rfl: 1 .  atenolol (TENORMIN) 25 MG tablet, TAKE ONE TABLET AT BEDTIME, Disp: 90 tablet, Rfl: 3 .  atorvastatin (LIPITOR) 40 MG tablet, TAKE ONE TABLET BY MOUTH EVERY DAY, Disp: 90 tablet, Rfl: 3 .  lisinopril (ZESTRIL) 10 MG tablet, TAKE ONE TABLET AT BEDTIME, Disp: 90 tablet, Rfl: 3 .  nitroGLYCERIN (NITROSTAT) 0.4 MG SL tablet, Place 1 tablet (0.4 mg total) under the tongue every 5  (five) minutes as needed for chest pain. For up to 3 doses per episode., Disp: 50 tablet, Rfl: 0 .  traMADol (ULTRAM) 50 MG tablet, Take 50 mg by mouth as needed., Disp: , Rfl:  .  traZODone (DESYREL) 150 MG tablet, TAKE ONE TABLET AT BEDTIME, Disp: 90 tablet, Rfl: 1  Current Facility-Administered Medications:  .  oxyCODONE (Oxy IR/ROXICODONE) immediate release tablet 5 mg, 5 mg, Oral, Q3H PRN, Hessie Knows, MD  EXAMTonette Bihari per patient if applicable: None  GENERAL: alert, oriented, appears well and in no acute distress  HEENT: atraumatic, conjunttiva clear, no obvious abnormalities on inspection of external nose and ears  NECK: normal movements of the head and neck  LUNGS: on inspection no signs of respiratory distress, breathing rate appears normal, no obvious gross SOB, gasping or wheezing  CV: no obvious cyanosis  MS: moves all visible extremities without noticeable abnormality, left breast area does appear larger than the right  Skin: There is a darkened spot on the tip of his right little toe, there appears to be an area of callus overlying this though patient notes this is "new skin" topical treatment  PSYCH/NEURO: pleasant and cooperative, no obvious depression or anxiety, speech and thought processing grossly intact  ASSESSMENT AND PLAN:  Discussed the following assessment and plan:  Skin lesion New black skin lesion on the tip of the patient's right little toe.  Given that he has applied a topical treatment to this it is a little difficult to visualize it over video.  Given this is a new black lesion it is concerning for a potential melanoma though it could also represent a benign cause such as a blood blister.  We are going to refer him urgently to dermatology given the potential for melanoma as a cause.  Lump of left breast Left breast fullness described medially in his breast.  We are going to obtain a mammogram and ultrasound to evaluate for potential causes.   Discussed potential for cancer or benign causes.    I discussed the assessment and treatment plan with the patient. The patient was provided an opportunity to ask questions and all were answered. The patient agreed with the plan and demonstrated an understanding of the instructions.   The patient was advised to call back or seek an in-person evaluation if the symptoms worsen or if the condition fails to improve as anticipated.  Tommi Rumps, MD

## 2019-05-22 NOTE — Assessment & Plan Note (Signed)
New black skin lesion on the tip of the patient's right little toe.  Given that he has applied a topical treatment to this it is a little difficult to visualize it over video.  Given this is a new black lesion it is concerning for a potential melanoma though it could also represent a benign cause such as a blood blister.  We are going to refer him urgently to dermatology given the potential for melanoma as a cause.

## 2019-05-22 NOTE — Assessment & Plan Note (Signed)
Left breast fullness described medially in his breast.  We are going to obtain a mammogram and ultrasound to evaluate for potential causes.  Discussed potential for cancer or benign causes.

## 2019-05-22 NOTE — Addendum Note (Signed)
Addended by: Leone Haven on: 05/22/2019 09:31 AM   Modules accepted: Orders

## 2019-05-23 ENCOUNTER — Telehealth: Payer: Self-pay | Admitting: Family Medicine

## 2019-05-23 ENCOUNTER — Ambulatory Visit
Admission: RE | Admit: 2019-05-23 | Discharge: 2019-05-23 | Disposition: A | Discharge status: Home / Self Care | Payer: PPO | Source: Ambulatory Visit | Attending: Family Medicine | Admitting: Family Medicine

## 2019-05-23 DIAGNOSIS — R928 Other abnormal and inconclusive findings on diagnostic imaging of breast: Secondary | ICD-10-CM | POA: Diagnosis not present

## 2019-05-23 DIAGNOSIS — N6320 Unspecified lump in the left breast, unspecified quadrant: Secondary | ICD-10-CM | POA: Diagnosis not present

## 2019-05-23 NOTE — Telephone Encounter (Signed)
I called pt and left a vm to call ofc to schedule Return in about 3 months (around 08/20/2019).

## 2019-05-26 ENCOUNTER — Other Ambulatory Visit: Payer: Self-pay | Admitting: Family Medicine

## 2019-05-26 DIAGNOSIS — M48062 Spinal stenosis, lumbar region with neurogenic claudication: Secondary | ICD-10-CM | POA: Diagnosis not present

## 2019-05-26 DIAGNOSIS — M5136 Other intervertebral disc degeneration, lumbar region: Secondary | ICD-10-CM | POA: Diagnosis not present

## 2019-05-26 DIAGNOSIS — M5416 Radiculopathy, lumbar region: Secondary | ICD-10-CM | POA: Diagnosis not present

## 2019-06-16 ENCOUNTER — Encounter: Payer: Self-pay | Admitting: Family Medicine

## 2019-06-16 ENCOUNTER — Other Ambulatory Visit: Payer: Self-pay

## 2019-06-16 ENCOUNTER — Ambulatory Visit
Admission: RE | Admit: 2019-06-16 | Discharge: 2019-06-16 | Disposition: A | Discharge status: Home / Self Care | Payer: PPO | Source: Ambulatory Visit | Attending: Family Medicine | Admitting: Family Medicine

## 2019-06-16 ENCOUNTER — Ambulatory Visit (INDEPENDENT_AMBULATORY_CARE_PROVIDER_SITE_OTHER): Payer: PPO | Admitting: Family Medicine

## 2019-06-16 VITALS — BP 130/80 | HR 75 | Temp 98.6°F | Ht 68.0 in | Wt 169.2 lb

## 2019-06-16 DIAGNOSIS — L989 Disorder of the skin and subcutaneous tissue, unspecified: Secondary | ICD-10-CM

## 2019-06-16 DIAGNOSIS — Q766 Other congenital malformations of ribs: Secondary | ICD-10-CM

## 2019-06-16 DIAGNOSIS — R0781 Pleurodynia: Secondary | ICD-10-CM | POA: Diagnosis not present

## 2019-06-16 NOTE — Progress Notes (Addendum)
  Tommi Rumps, MD Phone: [phone]  Karl Myers is a 74 y.o. male who presents today for follow-up.    Left breast enlargement: Patient notes this has been this way for some time now.  He just noticed it occurring all of a sudden.  There is no tenderness.  No nipple discharge.  He had a mammogram that was negative.  Black spot on right fifth toe: Notes this has resolved to him.  He felt as though it was a blood blister.  Skin lesion: This is behind his right ear.  Notes that it has been there for several months.  It comes and goes.  He'll pick it off and it will come back.  He has not seen a dermatologist.  Social History   Tobacco Use  Smoking Status Current Every Day Smoker  . Packs/day: 1.00  . Last attempt to quit: 06/26/2017  . Years since quitting: 1.9  Smokeless Tobacco Never Used     ROS see history of present illness  Objective  Physical Exam Vitals:   06/16/19 1526  BP: 130/80  Pulse: 75  Temp: 98.6 F (37 C)  SpO2: 96%    BP Readings from Last 3 Encounters:  06/16/19 130/80  03/29/19 130/68  08/31/18 140/78   Wt Readings from Last 3 Encounters:  06/16/19 169 lb 3.2 oz (76.7 kg)  05/22/19 168 lb (76.2 kg)  03/29/19 168 lb 8 oz (76.4 kg)    Physical Exam HENT:     Head:   Chest:    Musculoskeletal:       Feet:      Assessment/Plan: Please see individual problem list.  Skin lesion Could've been a blood blister though we will get him referred to dermatology as he never heard from them previously.  We'll also have them evaluate the area behind his ear.  Abnormal prominence of rib Discussed that this could represent arthritic change in his rib area though could also be some underlying bony lesion.  We'll obtain an x-ray to start with and then determine the next step after that.   Orders Placed This Encounter  Procedures  . DG Ribs Unilateral W/Chest Left    Standing Status:   Future    Standing Expiration Date:   08/13/2020   Order Specific Question:   Reason for Exam (SYMPTOM  OR DIAGNOSIS REQUIRED)    Answer:   left anterior righ prominence near the lower sternum    Order Specific Question:   Preferred imaging location?    Answer:   [facility]    Order Specific Question:   Radiology Contrast Protocol - do NOT remove file path    Answer:   [email or web address]  . Ambulatory referral to Dermatology    Referral Priority:   Routine    Referral Type:   Consultation    Referral Reason:   Specialty Services Required    Requested Specialty:   Dermatology    Number of Visits Requested:   1    No orders of the defined types were placed in this encounter.   This visit occurred during the SARS-CoV-2 public health emergency.  Safety protocols were in place, including screening questions prior to the visit, additional usage of staff PPE, and extensive cleaning of exam room while observing appropriate contact time as indicated for disinfecting solutions.    Tommi Rumps, MD [place]

## 2019-06-16 NOTE — Patient Instructions (Signed)
Nice to see you. Please go to get the x-ray completed. Dermatology should contact you.  If you have not heard anything in the next week please contact us.

## 2019-06-16 NOTE — Assessment & Plan Note (Signed)
Discussed that this could represent arthritic change in his rib area though could also be some underlying bony lesion.  We'll obtain an x-ray to start with and then determine the next step after that.

## 2019-06-16 NOTE — Assessment & Plan Note (Signed)
Could've been a blood blister though we will get him referred to dermatology as he never heard from them previously.  We'll also have them evaluate the area behind his ear.

## 2019-06-21 DIAGNOSIS — L578 Other skin changes due to chronic exposure to nonionizing radiation: Secondary | ICD-10-CM | POA: Diagnosis not present

## 2019-06-21 DIAGNOSIS — L84 Corns and callosities: Secondary | ICD-10-CM | POA: Diagnosis not present

## 2019-06-21 DIAGNOSIS — R58 Hemorrhage, not elsewhere classified: Secondary | ICD-10-CM | POA: Diagnosis not present

## 2019-06-21 DIAGNOSIS — L570 Actinic keratosis: Secondary | ICD-10-CM | POA: Diagnosis not present

## 2019-06-21 DIAGNOSIS — L821 Other seborrheic keratosis: Secondary | ICD-10-CM | POA: Diagnosis not present

## 2019-06-21 DIAGNOSIS — L820 Inflamed seborrheic keratosis: Secondary | ICD-10-CM | POA: Diagnosis not present

## 2019-06-29 ENCOUNTER — Other Ambulatory Visit: Payer: Self-pay | Admitting: Family Medicine

## 2019-07-17 ENCOUNTER — Encounter: Payer: Self-pay | Admitting: Internal Medicine

## 2019-07-17 ENCOUNTER — Ambulatory Visit (INDEPENDENT_AMBULATORY_CARE_PROVIDER_SITE_OTHER): Payer: PPO | Admitting: Internal Medicine

## 2019-07-17 DIAGNOSIS — M25511 Pain in right shoulder: Secondary | ICD-10-CM

## 2019-07-17 DIAGNOSIS — M5412 Radiculopathy, cervical region: Secondary | ICD-10-CM

## 2019-07-17 DIAGNOSIS — Z20822 Contact with and (suspected) exposure to covid-19: Secondary | ICD-10-CM

## 2019-07-17 MED ORDER — TIZANIDINE HCL 4 MG PO TABS: 4.0000 mg | ORAL_TABLET | Freq: Four times a day (QID) | ORAL | 0 refills | Status: DC | PRN

## 2019-07-17 MED ORDER — TRAMADOL HCL 50 MG PO TABS: 50.0000 mg | ORAL_TABLET | Freq: Three times a day (TID) | ORAL | 0 refills | Status: AC | PRN

## 2019-07-17 MED ORDER — PREDNISONE 10 MG PO TABS: ORAL_TABLET | ORAL | 0 refills | Status: DC

## 2019-07-17 NOTE — Patient Instructions (Signed)
I am prescribing prednisone ,  Tramadol and tizanidine   Prednisone : 60 mg daily for r 3 days,  Then reduce by tablet daily until gone   Tramadol 50 mg every 8 hours,  Ok to combine with tylenol    DO NOT TAKE MOTRIN OR ALEVE WHILE YOU ARE TAKING PREDNISONE   Tizanidine is a muscle relaxer  Can be used as needed for muscle spasm  If you develop symptoms of COVID,  YOU MUST QUARANTINE FOR TEN DAYS.  IF YOU DO NOT,  BUT YOUR friend is POSITIVE, YOU REALLY SHOULD ISOLATE,  BUT IF YOU DOUBLE MASK AND KEEP AT LEAST 6 FEET AWAY FROM EVERYONE ELSE  FOR THE FUNERAL  AND GO STRAIGHT HOME  YOU SHOULD BE OK TO ATTEND YOUR SISTER'S FUNERAL

## 2019-07-17 NOTE — Progress Notes (Signed)
Virtual Visit via [email or web address].mee  This visit type was conducted due to national recommendations for restrictions regarding the COVID-19 pandemic (e.g. social distancing).  This format is felt to be most appropriate for this patient at this time.  All issues noted in this document were discussed and addressed.  No physical exam was performed (except for noted visual exam findings with Video Visits).   I connected with@ on 07/17/19 at  4:30 PM EST by a video enabled telemedicine application and verified that I am speaking with the correct person using two identifiers. Location patient: home Location provider: work or home office Persons participating in the virtual visit: patient, provider  I discussed the limitations, risks, security and privacy concerns of performing an evaluation and management service by telephone and the availability of in person appointments. I also discussed with the patient that there may be a patient responsible charge related to this service. The patient expressed understanding and agreed to proceed.  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   Pain started on Saturday, several hours after loading a heavy piece of furniture onto the bed of a truck with an Environmental consultant.  Has been taking tylenol.   Pain is present 24/7 ,  Accompanied  by weakness of hand grip which is not new per wife.  .  No history of surgery on cervical spine , last MRI 2019 , but has had 3 surgeries on lower back   2) potential COVID 19 EXPOSURE:  Patient's sister died recently and he has been involved in planning her funeral which is scheduled as a graveside (all outside) on  Wednesday Feb 10..  Patient was at funeral home on  Saturday Feb 6 and was contacted subsequently that a contact from the meeting on Saturday is being tested for COVID today due to development of symptoms suggestive of the illness.  Wants to know if he can go to his sister's funeral on Wednesday.  Patient denies any symptoms of acute infectious illness.  He states that both parties were masked during the interaction and were socially distanced.   ROS: See pertinent positives and negatives per HPI.  Past Medical History:  Diagnosis Date  . Arthritis   . COPD (chronic obstructive pulmonary disease) ([place])   . DDD (degenerative disc disease), cervical   . DDD (degenerative disc disease), cervical   . Depression   . Hypertension   . Pre-diabetes     Past Surgical History:  Procedure Laterality Date  . BACK SURGERY     x 3  . CARPAL TUNNEL RELEASE Right 08/19/2017   Procedure: CARPAL TUNNEL RELEASE;  Surgeon: Hessie Knows, MD;  Location: [facility];  Service: Orthopedics;  Laterality: Right;  . CATARACT EXTRACTION W/PHACO Right 11/17/2016   Procedure: CATARACT EXTRACTION PHACO AND INTRAOCULAR LENS PLACEMENT (IOC);  Surgeon: Birder Robson, MD;  Location: [facility];  Service: Ophthalmology;  Laterality: Right;  [place] 00:37 AP% 12.6 CDE 4.69 Fluid pack lot # [id number]  . CATARACT EXTRACTION W/PHACO Left 12/08/2016   Procedure: CATARACT EXTRACTION PHACO AND INTRAOCULAR LENS PLACEMENT (IOC);  Surgeon: Birder Robson, MD;  Location: [facility];  Service: Ophthalmology;  Laterality: Left;  [place] 00:33 AP% 13.9 CDE 4.63 Fluid  pack lot # [id number]  . ULNAR TUNNEL RELEASE Right 08/19/2017   Procedure: CUBITAL TUNNEL RELEASE;  Surgeon: Hessie Knows, MD;  Location: [facility];  Service: Orthopedics;  Laterality: Right;    Family History  Problem Relation Age of Onset  . Heart attack Mother   . Prostate cancer Father   . Breast cancer Sister 29  . Dementia Sister   . Cancer Brother        unknown what kind    SOCIAL HX: reports that he has been smoking.  He has been smoking about 1.00 pack per day. He has never used smokeless tobacco. He reports that he does not drink alcohol or use drugs.   Current Outpatient Medications:  .  ALPRAZolam (XANAX) 0.5 MG tablet, TAKE ONE-HALF TABLET BY MOUTH EVERY MORNING AND TAKE ONE TABLET BY MOUTH AT BEDTIME AS DIRECTED, Disp: 45 tablet, Rfl: 1 .  atenolol (TENORMIN) 25 MG tablet, TAKE ONE TABLET AT BEDTIME, Disp: 90 tablet, Rfl: 3 .  atorvastatin (LIPITOR) 40 MG tablet, TAKE ONE TABLET BY MOUTH EVERY DAY, Disp: 90 tablet, Rfl: 3 .  lisinopril (ZESTRIL) 10 MG tablet, TAKE ONE TABLET AT BEDTIME, Disp: 90 tablet, Rfl: 3 .  nitroGLYCERIN (NITROSTAT) 0.4 MG SL tablet, Place 1 tablet (0.4 mg total) under the tongue every 5 (five) minutes as needed for chest pain. For up to 3 doses per episode., Disp: 50 tablet, Rfl: 0 .  traZODone (DESYREL) 150 MG tablet, TAKE ONE TABLET AT BEDTIME, Disp: 90 tablet, Rfl: 1 .  predniSONE (DELTASONE) 10 MG tablet, 6 tablets daily for 3 days, then reduce by 1 tablet daily until gone, Disp: 33 tablet, Rfl: 0 .  tiZANidine (ZANAFLEX) 4 MG tablet, Take 1 tablet (4 mg total) by mouth every 6 (six) hours as needed for muscle spasms., Disp: 30 tablet, Rfl: 0 .  traMADol (ULTRAM) 50 MG tablet, Take 1 tablet (50 mg total) by mouth every 8 (eight) hours as needed for up to 5 days., Disp: 15 tablet, Rfl: 0  Current Facility-Administered Medications:  .  oxyCODONE (Oxy IR/ROXICODONE) immediate release tablet 5 mg, 5 mg, Oral, Q3H PRN, Hessie Knows, MD  EXAM:  VITALS per patient if applicable:  GENERAL: alert, oriented, appears well and in no acute distress  HEENT: atraumatic, conjunttiva clear, no obvious abnormalities on inspection of external nose and ears  NECK: normal movements of the head and neck  LUNGS: on inspection no signs of respiratory distress, breathing rate appears normal, no obvious gross SOB, gasping or wheezing  CV: no obvious cyanosis  MS: moves all visible  extremities.  Has pain with abduction,  Flexion and extension of right arm. Notes pain with rest as well.  Per wife his grip is weak compared to left side, and his fingertips are cool but not mottled.   PSYCH/NEURO: pleasant and cooperative, no obvious depression or anxiety, speech and thought processing grossly intact  ASSESSMENT AND PLAN:  Discussed the following assessment and plan:  Acute pain of right shoulder  Cervical radiculopathy  Contact with and (suspected) exposure to covid-19  Acute pain of right shoulder Description of pain is lancinating,  Suggestive of neuropathic/radiculitis  Given his history f cervical spinal stenosis  Aggravated by Saturday's unusual physical activity  Prednisone 60 mg x 3, then taper ,  Tizanidine for muscle spasm  And tramadol 50 mg every 8 hours x 5 days .  Follow up with Dr Caryl Bis next week   Cervical radiculopathy MRI from 2019 reviewed.  Given his weak hand grip and new onset arm pain he may require neurosurgical evaluation for decompression   Contact with and (suspected) exposure to covid-19 Patient advised to isolate until exposure status is confirmed.  Advised not to attend his sister's grave side service if his contact is positive for COVID 19 but he does not plan on missing the service.  Advised , then , to double mask and increase his social distancing during service.     I discussed the assessment and treatment plan with the patient. The patient was provided an opportunity to ask questions and all were answered. The patient agreed with the plan and demonstrated an understanding of the instructions.   The patient was advised to call back or seek an in-person evaluation if the symptoms worsen or if the condition fails to improve as anticipated.  I provided  30 minutes of non-face-to-face time during this encounter reviewing patient's current problems and post surgeries.  Providing counseling on the above mentioned problems , and  coordination  of care .  Karl Mc, MD

## 2019-07-18 DIAGNOSIS — M25511 Pain in right shoulder: Secondary | ICD-10-CM | POA: Insufficient documentation

## 2019-07-18 DIAGNOSIS — Z20822 Contact with and (suspected) exposure to covid-19: Secondary | ICD-10-CM | POA: Insufficient documentation

## 2019-07-18 NOTE — Assessment & Plan Note (Signed)
Description of pain is lancinating,  Suggestive of neuropathic/radiculitis  Given his history f cervical spinal stenosis  Aggravated by Saturday's unusual physical activity  Prednisone 60 mg x 3, then taper ,  Tizanidine for muscle spasm  And tramadol 50 mg every 8 hours x 5 days .  Follow up with Dr Caryl Bis next week

## 2019-07-18 NOTE — Assessment & Plan Note (Signed)
MRI from 2019 reviewed.  Given his weak hand grip and new onset arm pain he may require neurosurgical evaluation for decompression

## 2019-07-18 NOTE — Assessment & Plan Note (Signed)
Patient advised to isolate until exposure status is confirmed.  Advised not to attend his sister's grave side service if his contact is positive for COVID 19 but he does not plan on missing the service.  Advised , then , to double mask and increase his social distancing during service.

## 2019-07-20 ENCOUNTER — Telehealth: Payer: Self-pay | Admitting: Family Medicine

## 2019-07-20 ENCOUNTER — Other Ambulatory Visit: Payer: Self-pay

## 2019-07-20 ENCOUNTER — Ambulatory Visit: Payer: PPO | Attending: Family

## 2019-07-20 DIAGNOSIS — Z20822 Contact with and (suspected) exposure to covid-19: Secondary | ICD-10-CM

## 2019-07-20 NOTE — Telephone Encounter (Signed)
Pt wife called and said that he was exposed to Covid on 2/6 he is now having chills,cough and a sore throat and want to be worked in with Dr. Caryl Bis

## 2019-07-20 NOTE — Telephone Encounter (Signed)
Please see my prior message. Patient should be advised to quarantine at home until we release him. He should only leave the house to be tested and to seek medical attention in the ED.  They should not have visitors to the home either.  If he develops shortness of breath or chest pain he should go to the ED.

## 2019-07-20 NOTE — Telephone Encounter (Signed)
Patient called and was exposed to covid and is having symptoms, his wife stated he is scheduled for testing today @ 2:30 she stated he needed to be seen due to symptoms, I informed her that the provider did not have any openings here at the office and I scheduled her for the new respiratory clinic in [place] for Friday evening and the address was given to the wife. This is a Pharmacist, hospital for you.   Oceanna Arruda,cma

## 2019-07-20 NOTE — Telephone Encounter (Signed)
I could do a virtual visit with him tomorrow at 4 pm. There is now an opening. The respiratory clinic is really only for people that need an in person evaluation after completing a virtual visit.

## 2019-07-20 NOTE — Telephone Encounter (Signed)
I called and spoke with the patient's wife and he is scheduled with you tomorrow at 4 pm. I did explain to the patient about quarantine and if any chest pain or SOB to go straight to ER.   she stated they were exposed on last Saturday at his sisters funeral. Lesia Hausen

## 2019-07-21 ENCOUNTER — Ambulatory Visit: Payer: Self-pay

## 2019-07-21 ENCOUNTER — Encounter: Payer: Self-pay | Admitting: Family Medicine

## 2019-07-21 ENCOUNTER — Ambulatory Visit (INDEPENDENT_AMBULATORY_CARE_PROVIDER_SITE_OTHER): Payer: PPO | Admitting: Family Medicine

## 2019-07-21 DIAGNOSIS — J989 Respiratory disorder, unspecified: Secondary | ICD-10-CM

## 2019-07-21 DIAGNOSIS — Z20822 Contact with and (suspected) exposure to covid-19: Secondary | ICD-10-CM

## 2019-07-21 LAB — NOVEL CORONAVIRUS, NAA: SARS-CoV-2, NAA: NOT DETECTED

## 2019-07-21 MED ORDER — BENZONATATE 200 MG PO CAPS: 200.0000 mg | ORAL_CAPSULE | Freq: Two times a day (BID) | ORAL | 0 refills | Status: DC | PRN

## 2019-07-21 NOTE — Progress Notes (Signed)
Virtual Visit via video Note  This visit type was conducted due to national recommendations for restrictions regarding the COVID-19 pandemic (e.g. social distancing).  This format is felt to be most appropriate for this patient at this time.  All issues noted in this document were discussed and addressed.  No physical exam was performed (except for noted visual exam findings with Video Visits).   I connected with Delane Ginger today at  4:00 PM EST by a video enabled telemedicine application and verified that I am speaking with the correct person using two identifiers. Location patient: home Location provider: work Persons participating in the virtual visit: patient, provider, Oryan Maben (wife)  I discussed the limitations, risks, security and privacy concerns of performing an evaluation and management service by telephone and the availability of in person appointments. I also discussed with the patient that there may be a patient responsible charge related to this service. The patient expressed understanding and agreed to proceed.  Reason for visit: same day visit   HPI: Respiratory illness: Patient notes he was exposed to COVID-19 when he was involved planning her funeral.  He subsequently developed symptoms on 07/20/2019.  He had significant cough with fever and chills yesterday and loss of taste and smell.  He has chest discomfort though only when he coughs.  No chest pain at other times.  No shortness of breath.  His wife ended up testing positive for Covid.  He tested negative.  Has been taking Tylenol and ibuprofen.   ROS: See pertinent positives and negatives per HPI.  Past Medical History:  Diagnosis Date  . Arthritis   . COPD (chronic obstructive pulmonary disease) ([place])   . DDD (degenerative disc disease), cervical   . DDD (degenerative disc disease), cervical   . Depression   . Hypertension   . Pre-diabetes     Past Surgical History:  Procedure Laterality Date  . BACK  SURGERY     x 3  . CARPAL TUNNEL RELEASE Right 08/19/2017   Procedure: CARPAL TUNNEL RELEASE;  Surgeon: Hessie Knows, MD;  Location: [facility] ORS;  Service: Orthopedics;  Laterality: Right;  . CATARACT EXTRACTION W/PHACO Right 11/17/2016   Procedure: CATARACT EXTRACTION PHACO AND INTRAOCULAR LENS PLACEMENT (IOC);  Surgeon: Birder Robson, MD;  Location: [facility] OR[facility];  Service: Ophthalmology;  Laterality: Right;  [place] 00:37 AP% 12.6 CDE 4.69 Fluid pack lot # [id number]  . CATARACT EXTRACTION W/PHACO Left 12/08/2016   Procedure: CATARACT EXTRACTION PHACO AND INTRAOCULAR LENS PLACEMENT (IOC);  Surgeon: Birder Robson, MD;  Location: [facility] ORS;  Service: Ophthalmology;  Laterality: Left;  [place] 00:33 AP% 13.9 CDE 4.63 Fluid pack lot # [id number]  . ULNAR TUNNEL RELEASE Right 08/19/2017   Procedure: CUBITAL TUNNEL RELEASE;  Surgeon: Hessie Knows, MD;  Location: [facility] ORS;  Service: Orthopedics;  Laterality: Right;    Family History  Problem Relation Age of Onset  . Heart attack Mother   . Prostate cancer Father   . Breast cancer Sister 67  . Dementia Sister   . Cancer Brother        unknown what kind    SOCIAL HX: Smoker   Current Outpatient Medications:  .  ALPRAZolam (XANAX) 0.5 MG tablet, TAKE ONE-HALF TABLET BY MOUTH EVERY MORNING AND TAKE ONE TABLET BY MOUTH AT BEDTIME AS DIRECTED, Disp: 45 tablet, Rfl: 1 .  atenolol (TENORMIN) 25 MG tablet, TAKE ONE TABLET AT BEDTIME, Disp: 90 tablet, Rfl: 3 .  atorvastatin (LIPITOR) 40 MG tablet, TAKE ONE TABLET BY  MOUTH EVERY DAY, Disp: 90 tablet, Rfl: 3 .  lisinopril (ZESTRIL) 10 MG tablet, TAKE ONE TABLET AT BEDTIME, Disp: 90 tablet, Rfl: 3 .  nitroGLYCERIN (NITROSTAT) 0.4 MG SL tablet, Place 1 tablet (0.4 mg total) under the tongue every 5 (five) minutes as needed for chest pain. For up to 3 doses per episode., Disp: 50 tablet, Rfl: 0 .  predniSONE (DELTASONE) 10 MG tablet, 6 tablets daily for 3 days, then reduce by 1 tablet daily until gone, Disp: 33  tablet, Rfl: 0 .  tiZANidine (ZANAFLEX) 4 MG tablet, Take 1 tablet (4 mg total) by mouth every 6 (six) hours as needed for muscle spasms., Disp: 30 tablet, Rfl: 0 .  traMADol (ULTRAM) 50 MG tablet, Take 1 tablet (50 mg total) by mouth every 8 (eight) hours as needed for up to 5 days., Disp: 15 tablet, Rfl: 0 .  traZODone (DESYREL) 150 MG tablet, TAKE ONE TABLET AT BEDTIME, Disp: 90 tablet, Rfl: 1 .  benzonatate (TESSALON) 200 MG capsule, Take 1 capsule (200 mg total) by mouth 2 (two) times daily as needed for cough., Disp: 20 capsule, Rfl: 0  Current Facility-Administered Medications:  .  oxyCODONE (Oxy IR/ROXICODONE) immediate release tablet 5 mg, 5 mg, Oral, Q3H PRN, Hessie Knows, MD  EXAM:  VITALS per patient if applicable:  GENERAL: alert, oriented, appears well and in no acute distress  HEENT: atraumatic, conjunttiva clear, no obvious abnormalities on inspection of external nose and ears  NECK: normal movements of the head and neck  LUNGS: on inspection no signs of respiratory distress, breathing rate appears normal, no obvious gross SOB, gasping or wheezing  CV: no obvious cyanosis  MS: moves all visible extremities without noticeable abnormality  PSYCH/NEURO: pleasant and cooperative, no obvious depression or anxiety, speech and thought processing grossly intact  ASSESSMENT AND PLAN:  Discussed the following assessment and plan:  Respiratory illness Given his symptoms I suspect he has COVID-19.  I suspect his Covid test is a false negative.  Advised on supportive care.  Discussed strict quarantine guidelines and advised to quarantine until he is at least 10 days from symptom onset with at least 3 days of improved symptoms and at least 3 days of no fever with no use of Tylenol or ibuprofen.  Will treat cough with Tessalon.  He notes he will have his son pick this up and drop it off at his door.  Advised on reasons to seek medical attention in the emergency department.   No  orders of the defined types were placed in this encounter.   Meds ordered this encounter  Medications  . benzonatate (TESSALON) 200 MG capsule    Sig: Take 1 capsule (200 mg total) by mouth 2 (two) times daily as needed for cough.    Dispense:  20 capsule    Refill:  0     I discussed the assessment and treatment plan with the patient. The patient was provided an opportunity to ask questions and all were answered. The patient agreed with the plan and demonstrated an understanding of the instructions.   The patient was advised to call back or seek an in-person evaluation if the symptoms worsen or if the condition fails to improve as anticipated.    Tommi Rumps, MD

## 2019-07-21 NOTE — Assessment & Plan Note (Signed)
Given his symptoms I suspect he has COVID-19.  I suspect his Covid test is a false negative.  Advised on supportive care.  Discussed strict quarantine guidelines and advised to quarantine until he is at least 10 days from symptom onset with at least 3 days of improved symptoms and at least 3 days of no fever with no use of Tylenol or ibuprofen.  Will treat cough with Tessalon.  He notes he will have his son pick this up and drop it off at his door.  Advised on reasons to seek medical attention in the emergency department.

## 2019-07-24 ENCOUNTER — Emergency Department: Payer: PPO

## 2019-07-24 ENCOUNTER — Encounter: Payer: Self-pay | Admitting: Emergency Medicine

## 2019-07-24 ENCOUNTER — Encounter (HOSPITAL_COMMUNITY): Payer: Self-pay | Admitting: Family Medicine

## 2019-07-24 ENCOUNTER — Inpatient Hospital Stay (HOSPITAL_COMMUNITY)
Admission: AD | Admit: 2019-07-24 | Discharge: 2019-07-27 | DRG: 177 | Disposition: A | Discharge status: Home / Self Care | Payer: PPO | Source: Other Acute Inpatient Hospital | Attending: Family Medicine | Admitting: Family Medicine

## 2019-07-24 ENCOUNTER — Other Ambulatory Visit: Payer: Self-pay

## 2019-07-24 ENCOUNTER — Telehealth: Payer: Self-pay | Admitting: Family Medicine

## 2019-07-24 ENCOUNTER — Inpatient Hospital Stay
Admission: EM | Admit: 2019-07-24 | Discharge: 2019-07-24 | DRG: 177 | Disposition: A | Discharge status: Other Acute Inpatient Hospital | Payer: PPO | Attending: Internal Medicine | Admitting: Internal Medicine

## 2019-07-24 DIAGNOSIS — F329 Major depressive disorder, single episode, unspecified: Secondary | ICD-10-CM | POA: Diagnosis present

## 2019-07-24 DIAGNOSIS — J449 Chronic obstructive pulmonary disease, unspecified: Secondary | ICD-10-CM | POA: Diagnosis present

## 2019-07-24 DIAGNOSIS — R000 Tachycardia, unspecified: Secondary | ICD-10-CM | POA: Diagnosis not present

## 2019-07-24 DIAGNOSIS — Z79899 Other long term (current) drug therapy: Secondary | ICD-10-CM

## 2019-07-24 DIAGNOSIS — U071 COVID-19: Principal | ICD-10-CM

## 2019-07-24 DIAGNOSIS — I129 Hypertensive chronic kidney disease with stage 1 through stage 4 chronic kidney disease, or unspecified chronic kidney disease: Secondary | ICD-10-CM | POA: Diagnosis not present

## 2019-07-24 DIAGNOSIS — A419 Sepsis, unspecified organism: Secondary | ICD-10-CM | POA: Diagnosis not present

## 2019-07-24 DIAGNOSIS — F419 Anxiety disorder, unspecified: Secondary | ICD-10-CM | POA: Diagnosis present

## 2019-07-24 DIAGNOSIS — E871 Hypo-osmolality and hyponatremia: Secondary | ICD-10-CM | POA: Diagnosis not present

## 2019-07-24 DIAGNOSIS — Z8616 Personal history of COVID-19: Secondary | ICD-10-CM | POA: Diagnosis present

## 2019-07-24 DIAGNOSIS — N179 Acute kidney failure, unspecified: Secondary | ICD-10-CM | POA: Diagnosis present

## 2019-07-24 DIAGNOSIS — E861 Hypovolemia: Secondary | ICD-10-CM

## 2019-07-24 DIAGNOSIS — J9601 Acute respiratory failure with hypoxia: Secondary | ICD-10-CM | POA: Diagnosis present

## 2019-07-24 DIAGNOSIS — G9341 Metabolic encephalopathy: Secondary | ICD-10-CM | POA: Diagnosis present

## 2019-07-24 DIAGNOSIS — Z885 Allergy status to narcotic agent status: Secondary | ICD-10-CM | POA: Diagnosis not present

## 2019-07-24 DIAGNOSIS — Z803 Family history of malignant neoplasm of breast: Secondary | ICD-10-CM

## 2019-07-24 DIAGNOSIS — Z66 Do not resuscitate: Secondary | ICD-10-CM | POA: Diagnosis not present

## 2019-07-24 DIAGNOSIS — I10 Essential (primary) hypertension: Secondary | ICD-10-CM | POA: Diagnosis present

## 2019-07-24 DIAGNOSIS — J069 Acute upper respiratory infection, unspecified: Secondary | ICD-10-CM

## 2019-07-24 DIAGNOSIS — G9340 Encephalopathy, unspecified: Secondary | ICD-10-CM

## 2019-07-24 DIAGNOSIS — Z8042 Family history of malignant neoplasm of prostate: Secondary | ICD-10-CM

## 2019-07-24 DIAGNOSIS — I959 Hypotension, unspecified: Secondary | ICD-10-CM | POA: Diagnosis not present

## 2019-07-24 DIAGNOSIS — E1122 Type 2 diabetes mellitus with diabetic chronic kidney disease: Secondary | ICD-10-CM | POA: Diagnosis present

## 2019-07-24 DIAGNOSIS — F17210 Nicotine dependence, cigarettes, uncomplicated: Secondary | ICD-10-CM | POA: Diagnosis present

## 2019-07-24 DIAGNOSIS — E785 Hyperlipidemia, unspecified: Secondary | ICD-10-CM | POA: Diagnosis present

## 2019-07-24 DIAGNOSIS — J1282 Pneumonia due to coronavirus disease 2019: Secondary | ICD-10-CM | POA: Diagnosis not present

## 2019-07-24 DIAGNOSIS — Z8249 Family history of ischemic heart disease and other diseases of the circulatory system: Secondary | ICD-10-CM

## 2019-07-24 DIAGNOSIS — D72829 Elevated white blood cell count, unspecified: Secondary | ICD-10-CM | POA: Diagnosis not present

## 2019-07-24 DIAGNOSIS — Z720 Tobacco use: Secondary | ICD-10-CM | POA: Diagnosis not present

## 2019-07-24 DIAGNOSIS — T380X5A Adverse effect of glucocorticoids and synthetic analogues, initial encounter: Secondary | ICD-10-CM | POA: Diagnosis not present

## 2019-07-24 DIAGNOSIS — N182 Chronic kidney disease, stage 2 (mild): Secondary | ICD-10-CM | POA: Diagnosis not present

## 2019-07-24 DIAGNOSIS — I9589 Other hypotension: Secondary | ICD-10-CM | POA: Diagnosis not present

## 2019-07-24 DIAGNOSIS — G8929 Other chronic pain: Secondary | ICD-10-CM | POA: Diagnosis present

## 2019-07-24 DIAGNOSIS — I2510 Atherosclerotic heart disease of native coronary artery without angina pectoris: Secondary | ICD-10-CM | POA: Diagnosis not present

## 2019-07-24 DIAGNOSIS — R509 Fever, unspecified: Secondary | ICD-10-CM | POA: Diagnosis not present

## 2019-07-24 DIAGNOSIS — Z888 Allergy status to other drugs, medicaments and biological substances status: Secondary | ICD-10-CM

## 2019-07-24 DIAGNOSIS — K5900 Constipation, unspecified: Secondary | ICD-10-CM | POA: Diagnosis present

## 2019-07-24 DIAGNOSIS — I480 Paroxysmal atrial fibrillation: Secondary | ICD-10-CM | POA: Diagnosis present

## 2019-07-24 DIAGNOSIS — I25118 Atherosclerotic heart disease of native coronary artery with other forms of angina pectoris: Secondary | ICD-10-CM | POA: Diagnosis not present

## 2019-07-24 DIAGNOSIS — J440 Chronic obstructive pulmonary disease with acute lower respiratory infection: Secondary | ICD-10-CM | POA: Diagnosis not present

## 2019-07-24 DIAGNOSIS — M545 Low back pain: Secondary | ICD-10-CM | POA: Diagnosis present

## 2019-07-24 DIAGNOSIS — R001 Bradycardia, unspecified: Secondary | ICD-10-CM | POA: Diagnosis not present

## 2019-07-24 LAB — COMPREHENSIVE METABOLIC PANEL
ALT: 18 U/L (ref 0–44)
AST: 23 U/L (ref 15–41)
Albumin: 3.5 g/dL (ref 3.5–5.0)
Alkaline Phosphatase: 54 U/L (ref 38–126)
Anion gap: 9 (ref 5–15)
BUN: 30 mg/dL — ABNORMAL HIGH (ref 8–23)
CO2: 25 mmol/L (ref 22–32)
Calcium: 8.4 mg/dL — ABNORMAL LOW (ref 8.9–10.3)
Chloride: 97 mmol/L — ABNORMAL LOW (ref 98–111)
Creatinine, Ser: 1.34 mg/dL — ABNORMAL HIGH (ref 0.61–1.24)
GFR calc Af Amer: >60 mL/min (ref >60)
GFR calc non Af Amer: 52 mL/min — ABNORMAL LOW (ref >60)
Glucose, Bld: 116 mg/dL — ABNORMAL HIGH (ref 70–99)
Potassium: 3.9 mmol/L (ref 3.5–5.1)
Sodium: 131 mmol/L — ABNORMAL LOW (ref 135–145)
Total Bilirubin: 0.6 mg/dL (ref 0.3–1.2)
Total Protein: 6.4 g/dL — ABNORMAL LOW (ref 6.5–8.1)

## 2019-07-24 LAB — CBC WITH DIFFERENTIAL/PLATELET
Abs Immature Granulocytes: 0.05 10*3/uL (ref 0.00–0.07)
Basophils Absolute: 0 10*3/uL (ref 0.0–0.1)
Basophils Relative: 0 %
Eosinophils Absolute: 0 10*3/uL (ref 0.0–0.5)
Eosinophils Relative: 0 %
HCT: 40.3 % (ref 39.0–52.0)
Hemoglobin: 13.4 g/dL (ref 13.0–17.0)
Immature Granulocytes: 1 %
Lymphocytes Relative: 13 %
Lymphs Abs: 1.1 10*3/uL (ref 0.7–4.0)
MCH: 32.3 pg (ref 26.0–34.0)
MCHC: 33.3 g/dL (ref 30.0–36.0)
MCV: 97.1 fL (ref 80.0–100.0)
Monocytes Absolute: 1.1 10*3/uL — ABNORMAL HIGH (ref 0.1–1.0)
Monocytes Relative: 13 %
Neutro Abs: 6.2 10*3/uL (ref 1.7–7.7)
Neutrophils Relative %: 73 %
Platelets: 163 10*3/uL (ref 150–400)
RBC: 4.15 MIL/uL — ABNORMAL LOW (ref 4.22–5.81)
RDW: 14.1 % (ref 11.5–15.5)
WBC: 8.4 10*3/uL (ref 4.0–10.5)
nRBC: 0 % (ref 0.0–0.2)

## 2019-07-24 LAB — URINALYSIS, COMPLETE (UACMP) WITH MICROSCOPIC
Bacteria, UA: NONE SEEN
Bilirubin Urine: NEGATIVE
Glucose, UA: NEGATIVE mg/dL
Hgb urine dipstick: NEGATIVE
Ketones, ur: NEGATIVE mg/dL
Leukocytes,Ua: NEGATIVE
Nitrite: NEGATIVE
Protein, ur: NEGATIVE mg/dL
Specific Gravity, Urine: 1.006 (ref 1.005–1.030)
Squamous Epithelial / HPF: NONE SEEN (ref 0–5)
pH: 6 (ref 5.0–8.0)

## 2019-07-24 LAB — LIPASE, BLOOD: Lipase: 26 U/L (ref 11–51)

## 2019-07-24 LAB — RESPIRATORY PANEL BY RT PCR (FLU A&B, COVID)
Influenza A by PCR: NEGATIVE
Influenza B by PCR: NEGATIVE
SARS Coronavirus 2 by RT PCR: POSITIVE — AB

## 2019-07-24 LAB — APTT: aPTT: 35 seconds (ref 24–36)

## 2019-07-24 LAB — PROTIME-INR
INR: 1.1 (ref 0.8–1.2)
Prothrombin Time: 13.7 seconds (ref 11.4–15.2)

## 2019-07-24 LAB — LACTIC ACID, PLASMA: Lactic Acid, Venous: 1.1 mmol/L (ref 0.5–1.9)

## 2019-07-24 LAB — PROCALCITONIN: Procalcitonin: 0.11 ng/mL

## 2019-07-24 MED ORDER — LACTATED RINGERS IV BOLUS (SEPSIS)
250.0000 mL | Freq: Once | INTRAVENOUS | Status: AC
  Administered 2019-07-24: 250 mL via INTRAVENOUS

## 2019-07-24 MED ORDER — SODIUM CHLORIDE 0.9 % IV SOLN
200.0000 mg | Freq: Once | INTRAVENOUS | Status: AC
  Administered 2019-07-24: 200 mg via INTRAVENOUS
  Filled 2019-07-24: qty 200

## 2019-07-24 MED ORDER — SODIUM CHLORIDE 0.9% FLUSH: 3.0000 mL | INTRAVENOUS | Status: DC | PRN

## 2019-07-24 MED ORDER — GUAIFENESIN 100 MG/5ML PO SOLN: 10.0000 mL | ORAL | Status: DC | PRN

## 2019-07-24 MED ORDER — TRAMADOL HCL 50 MG PO TABS
50.0000 mg | ORAL_TABLET | Freq: Two times a day (BID) | ORAL | Status: DC | PRN
  Administered 2019-07-24: 50 mg via ORAL
  Filled 2019-07-24: qty 1

## 2019-07-24 MED ORDER — SODIUM CHLORIDE 0.9 % IV SOLN: 250.0000 mL | INTRAVENOUS | Status: DC | PRN

## 2019-07-24 MED ORDER — SODIUM CHLORIDE 0.9% FLUSH
3.0000 mL | Freq: Two times a day (BID) | INTRAVENOUS | Status: DC
  Administered 2019-07-24 – 2019-07-27 (×5): 3 mL via INTRAVENOUS

## 2019-07-24 MED ORDER — SODIUM CHLORIDE 0.9 % IV SOLN
500.0000 mg | INTRAVENOUS | Status: DC
  Administered 2019-07-24: 13:00:00 500 mg via INTRAVENOUS
  Filled 2019-07-24: qty 500

## 2019-07-24 MED ORDER — IPRATROPIUM-ALBUTEROL 20-100 MCG/ACT IN AERS
1.0000 | INHALATION_SPRAY | Freq: Four times a day (QID) | RESPIRATORY_TRACT | Status: DC | PRN
  Filled 2019-07-24: qty 4

## 2019-07-24 MED ORDER — ENOXAPARIN SODIUM 40 MG/0.4ML ~~LOC~~ SOLN: 40.0000 mg | SUBCUTANEOUS | Status: DC

## 2019-07-24 MED ORDER — SODIUM CHLORIDE 0.9 % IV SOLN
100.0000 mg | Freq: Every day | INTRAVENOUS | Status: DC
  Administered 2019-07-25 – 2019-07-27 (×3): 100 mg via INTRAVENOUS
  Filled 2019-07-24 (×3): qty 20

## 2019-07-24 MED ORDER — ASCORBIC ACID 500 MG PO TABS
500.0000 mg | ORAL_TABLET | Freq: Every day | ORAL | Status: DC
  Administered 2019-07-24: 15:00:00 500 mg via ORAL
  Filled 2019-07-24: qty 1

## 2019-07-24 MED ORDER — NICOTINE 21 MG/24HR TD PT24
21.0000 mg | MEDICATED_PATCH | Freq: Every day | TRANSDERMAL | Status: DC
  Administered 2019-07-24: 15:00:00 21 mg via TRANSDERMAL
  Filled 2019-07-24: qty 1

## 2019-07-24 MED ORDER — ONDANSETRON HCL 4 MG PO TABS: 4.0000 mg | ORAL_TABLET | Freq: Four times a day (QID) | ORAL | Status: DC | PRN

## 2019-07-24 MED ORDER — ALBUTEROL SULFATE HFA 108 (90 BASE) MCG/ACT IN AERS
2.0000 | INHALATION_SPRAY | RESPIRATORY_TRACT | Status: DC | PRN
  Filled 2019-07-24: qty 6.7

## 2019-07-24 MED ORDER — ACETAMINOPHEN 325 MG PO TABS
650.0000 mg | ORAL_TABLET | Freq: Four times a day (QID) | ORAL | Status: DC | PRN
  Administered 2019-07-24: 650 mg via ORAL
  Filled 2019-07-24: qty 2

## 2019-07-24 MED ORDER — ATORVASTATIN CALCIUM 40 MG PO TABS
40.0000 mg | ORAL_TABLET | Freq: Every day | ORAL | Status: DC
  Administered 2019-07-25 – 2019-07-26 (×2): 40 mg via ORAL
  Filled 2019-07-24 (×3): qty 1

## 2019-07-24 MED ORDER — METHYLPREDNISOLONE SODIUM SUCC 40 MG IJ SOLR: 40.0000 mg | Freq: Two times a day (BID) | INTRAMUSCULAR | Status: DC

## 2019-07-24 MED ORDER — DM-GUAIFENESIN ER 30-600 MG PO TB12
1.0000 | ORAL_TABLET | Freq: Two times a day (BID) | ORAL | Status: DC
  Administered 2019-07-24: 15:00:00 1 via ORAL
  Filled 2019-07-24: qty 1

## 2019-07-24 MED ORDER — ALPRAZOLAM 0.5 MG PO TABS
0.5000 mg | ORAL_TABLET | Freq: Every day | ORAL | Status: DC
  Administered 2019-07-24 – 2019-07-26 (×3): 0.5 mg via ORAL
  Filled 2019-07-24 (×3): qty 1

## 2019-07-24 MED ORDER — INSULIN ASPART 100 UNIT/ML ~~LOC~~ SOLN
0.0000 [IU] | Freq: Three times a day (TID) | SUBCUTANEOUS | Status: DC
  Administered 2019-07-26: 1 [IU] via SUBCUTANEOUS

## 2019-07-24 MED ORDER — SODIUM CHLORIDE 0.9 % IV SOLN: INTRAVENOUS | Status: DC

## 2019-07-24 MED ORDER — IPRATROPIUM BROMIDE HFA 17 MCG/ACT IN AERS
2.0000 | INHALATION_SPRAY | RESPIRATORY_TRACT | Status: DC
  Filled 2019-07-24: qty 12.9

## 2019-07-24 MED ORDER — POLYETHYLENE GLYCOL 3350 17 G PO PACK: 17.0000 g | PACK | Freq: Every day | ORAL | Status: DC | PRN

## 2019-07-24 MED ORDER — ENOXAPARIN SODIUM 40 MG/0.4ML ~~LOC~~ SOLN
40.0000 mg | SUBCUTANEOUS | Status: DC
  Administered 2019-07-24 – 2019-07-25 (×2): 40 mg via SUBCUTANEOUS
  Filled 2019-07-24 (×2): qty 0.4

## 2019-07-24 MED ORDER — ZINC SULFATE 220 (50 ZN) MG PO CAPS
220.0000 mg | ORAL_CAPSULE | Freq: Every day | ORAL | Status: DC
  Administered 2019-07-24: 15:00:00 220 mg via ORAL
  Filled 2019-07-24: qty 1

## 2019-07-24 MED ORDER — GUAIFENESIN-DM 100-10 MG/5ML PO SYRP
10.0000 mL | ORAL_SOLUTION | ORAL | Status: DC | PRN
  Administered 2019-07-24 – 2019-07-26 (×5): 10 mL via ORAL
  Filled 2019-07-24 (×5): qty 10

## 2019-07-24 MED ORDER — ONDANSETRON HCL 4 MG/2ML IJ SOLN: 4.0000 mg | Freq: Four times a day (QID) | INTRAMUSCULAR | Status: DC | PRN

## 2019-07-24 MED ORDER — ONDANSETRON HCL 4 MG/2ML IJ SOLN: 4.0000 mg | Freq: Three times a day (TID) | INTRAMUSCULAR | Status: DC | PRN

## 2019-07-24 MED ORDER — SODIUM CHLORIDE 0.9% FLUSH
3.0000 mL | Freq: Two times a day (BID) | INTRAVENOUS | Status: DC
  Administered 2019-07-24 – 2019-07-26 (×3): 3 mL via INTRAVENOUS

## 2019-07-24 MED ORDER — LACTATED RINGERS IV BOLUS (SEPSIS)
1000.0000 mL | Freq: Once | INTRAVENOUS | Status: AC
  Administered 2019-07-24: 1000 mL via INTRAVENOUS

## 2019-07-24 MED ORDER — ACETAMINOPHEN 325 MG PO TABS: 650.0000 mg | ORAL_TABLET | Freq: Four times a day (QID) | ORAL | Status: DC | PRN

## 2019-07-24 MED ORDER — SODIUM CHLORIDE 0.9 % IV SOLN
100.0000 mg | Freq: Every day | INTRAVENOUS | Status: DC
  Filled 2019-07-24: qty 20

## 2019-07-24 MED ORDER — LACTATED RINGERS IV BOLUS (SEPSIS)
1000.0000 mL | Freq: Once | INTRAVENOUS | Status: AC
  Administered 2019-07-24: 12:00:00 1000 mL via INTRAVENOUS

## 2019-07-24 MED ORDER — DEXAMETHASONE SODIUM PHOSPHATE 10 MG/ML IJ SOLN
10.0000 mg | Freq: Once | INTRAMUSCULAR | Status: AC
  Administered 2019-07-24: 10 mg via INTRAVENOUS
  Filled 2019-07-24: qty 1

## 2019-07-24 MED ORDER — DEXAMETHASONE SODIUM PHOSPHATE 10 MG/ML IJ SOLN
6.0000 mg | INTRAMUSCULAR | Status: DC
  Administered 2019-07-25 – 2019-07-27 (×3): 6 mg via INTRAVENOUS
  Filled 2019-07-24 (×3): qty 1

## 2019-07-24 MED ORDER — SODIUM CHLORIDE 0.9 % IV SOLN
2.0000 g | INTRAVENOUS | Status: DC
  Administered 2019-07-24: 2 g via INTRAVENOUS
  Filled 2019-07-24: qty 20

## 2019-07-24 NOTE — H&P (Signed)
History and Physical    [name] [id number] DOB: 1945-08-23 DOA: 07/24/2019  Referring MD/NP/PA:   PCP: Leone Haven, MD   Patient coming from:  The patient is coming from home.  At baseline, pt is independent for most of ADL.        Chief Complaint: Fever, shortness of breath  HPI: Karl Myers is a 74 y.o. male with medical history significant of hypertension, hyperlipidemia, COPD, depression, anxiety, tobacco abuse, CKD stage II, CAD, who presents with fever, shortness of breath.  Per his wife (I called his wife by phone), patient has fever, chills, cough, shortness of breath in the past several days.  Does not seem to have chest pain.  Wife states that herself was Covid +4 days ago.  Patient is mildly confused at home.  His mental status has improved in the emergency room.  When saw patient in ED, he is alert, oriented x3.  Patient states that he had nausea and vomited yesterday, which has resolved.  No abdominal pain.  He states that he is constipated.  No symptoms of UTI or unilateral weakness.  No facial droop or slurred speech.  Patient was initially hypotensive with blood pressure 84/43, which improved to 125/77 after received 2.5 LR.  ED Course: pt was found to have positive Covid PCR, WBC 8.4, lactic acid 1.1, INR 1.1, PTT 35, worsening renal function, temperature normal, tachycardia, oxygen saturation 87% on room air, which improved to 95 on 2 L nasal cannula oxygen.  Chest x-ray showed bilateral multifocal infiltration.  Patient is admitted to [facility] bed as inpatient.  Review of Systems:   General: has fevers, chills, no body weight gain, has poor appetite, has fatigue HEENT: no blurry vision, hearing changes or sore throat Respiratory: has dyspnea, coughing, no wheezing CV: no chest pain, no palpitations GI: had nausea, vomiting, no abdominal pain, diarrhea, has constipation GU: no dysuria, burning on urination, increased urinary frequency, hematuria    Ext: no leg edema Neuro: no unilateral weakness, numbness, or tingling, no vision change or hearing loss Skin: no rash, no skin tear. MSK: No muscle spasm, no deformity, no limitation of range of movement in spin Heme: No easy bruising.  Travel history: No recent long distant travel.  Allergy:  Allergies  Allergen Reactions  . Hydrocodone-Acetaminophen Other (See Comments)    With large quantities "hyper"  . Neurontin [Gabapentin] Other (See Comments)    With large quantities "hyper"     Past Medical History:  Diagnosis Date  . Arthritis   . COPD (chronic obstructive pulmonary disease) ([place])   . DDD (degenerative disc disease), cervical   . DDD (degenerative disc disease), cervical   . Depression   . Hypertension   . Pre-diabetes     Past Surgical History:  Procedure Laterality Date  . BACK SURGERY     x 3  . CARPAL TUNNEL RELEASE Right 08/19/2017   Procedure: CARPAL TUNNEL RELEASE;  Surgeon: Hessie Knows, MD;  Location: [facility];  Service: Orthopedics;  Laterality: Right;  . CATARACT EXTRACTION W/PHACO Right 11/17/2016   Procedure: CATARACT EXTRACTION PHACO AND INTRAOCULAR LENS PLACEMENT (IOC);  Surgeon: Birder Robson, MD;  Location: [facility];  Service: Ophthalmology;  Laterality: Right;  [place] 00:37 AP% 12.6 CDE 4.69 Fluid pack lot # [id number]  . CATARACT EXTRACTION W/PHACO Left 12/08/2016   Procedure: CATARACT EXTRACTION PHACO AND INTRAOCULAR LENS PLACEMENT (IOC);  Surgeon: Birder Robson, MD;  Location: [facility];  Service: Ophthalmology;  Laterality: Left;  [place]  00:33 AP% 13.9 CDE 4.63 Fluid pack lot # [id number]  . ULNAR TUNNEL RELEASE Right 08/19/2017   Procedure: CUBITAL TUNNEL RELEASE;  Surgeon: Hessie Knows, MD;  Location: [facility] OR[facility];  Service: Orthopedics;  Laterality: Right;    Social History:  reports that he has been smoking. He has been smoking about 1.00 pack per day. He has never used smokeless tobacco. He reports that he does not drink alcohol or use  drugs.  Family History:  Family History  Problem Relation Age of Onset  . Heart attack Mother   . Prostate cancer Father   . Breast cancer Sister 66  . Dementia Sister   . Cancer Brother        unknown what kind     Prior to Admission medications   Medication Sig Start Date End Date Taking? Authorizing Provider  ALPRAZolam (XANAX) 0.5 MG tablet TAKE ONE-HALF TABLET BY MOUTH EVERY MORNING AND TAKE ONE TABLET BY MOUTH AT BEDTIME AS DIRECTED Patient taking differently: Take 0.5 mg by mouth at bedtime.  06/29/19  Yes Leone Haven, MD  atenolol (TENORMIN) 25 MG tablet TAKE ONE TABLET AT BEDTIME 11/28/18  Yes Leone Haven, MD  atorvastatin (LIPITOR) 40 MG tablet TAKE ONE TABLET BY MOUTH EVERY DAY Patient taking differently: Take 40 mg by mouth at bedtime.  02/21/19  Yes Leone Haven, MD  benzonatate (TESSALON) 200 MG capsule Take 1 capsule (200 mg total) by mouth 2 (two) times daily as needed for cough. 07/21/19  Yes Leone Haven, MD  cholecalciferol (VITAMIN D3) 25 MCG (1000 UNIT) tablet Take 1,000 Units by mouth daily.   Yes [provider]  lisinopril (ZESTRIL) 10 MG tablet TAKE ONE TABLET AT BEDTIME 11/28/18  Yes Leone Haven, MD  nitroGLYCERIN (NITROSTAT) 0.4 MG SL tablet Place 1 tablet (0.4 mg total) under the tongue every 5 (five) minutes as needed for chest pain. For up to 3 doses per episode. 02/21/19  Yes Leone Haven, MD  predniSONE (DELTASONE) 10 MG tablet 6 tablets daily for 3 days, then reduce by 1 tablet daily until gone 07/17/19  Yes Crecencio Mc, MD  tiZANidine (ZANAFLEX) 4 MG tablet Take 1 tablet (4 mg total) by mouth every 6 (six) hours as needed for muscle spasms. 07/17/19  Yes Crecencio Mc, MD  traZODone (DESYREL) 150 MG tablet TAKE ONE TABLET AT BEDTIME 05/26/19  Yes Leone Haven, MD  vitamin C (ASCORBIC ACID) 250 MG tablet Take 250 mg by mouth daily.   Yes [provider]  Zinc 100 MG TABS Take 100 mg by mouth daily.    Yes [provider]    Physical Exam: Vitals:   07/24/19 1700 07/24/19 1715 07/24/19 2000 07/24/19 2001  BP: 118/81  (!) 145/87 (!) 145/87  Pulse:  60 (!) 59 (!) 58  Resp: (!) 23 (!) 23 18 17   Temp:    98 F (36.7 C)  TempSrc:      SpO2:  92% 92% 94%  Weight:      Height:       General: Not in acute distress.  Dry mucus membrane HEENT:       Eyes: PERRL, EOMI, no scleral icterus.       ENT: No discharge from the ears and nose, no pharynx injection, no tonsillar enlargement.        Neck: No JVD, no bruit, no mass felt. Heme: No neck lymph node enlargement. Cardiac: S1/S2, RRR, No murmurs, No gallops  or rubs. Respiratory: No rales, wheezing, rhonchi or rubs. GI: Soft, nondistended, nontender, no rebound pain, no organomegaly, BS present. GU: No hematuria Ext: No pitting leg edema bilaterally. 2+DP/PT pulse bilaterally. Musculoskeletal: No joint deformities, No joint redness or warmth, no limitation of ROM in spin. Skin: No rashes.  Neuro: Alert, oriented X3, cranial nerves II-XII grossly intact, moves all extremities normally. Psych: Patient is not psychotic, no suicidal or hemocidal ideation.  Labs on Admission: I have personally reviewed following labs and imaging studies  CBC: Recent Labs  Lab 07/24/19 1122  WBC 8.4  NEUTROABS 6.2  HGB 13.4  HCT 40.3  MCV 97.1  PLT [id number]   Basic Metabolic Panel: Recent Labs  Lab 07/24/19 1122  NA 131*  K 3.9  CL 97*  CO2 25  GLUCOSE 116*  BUN 30*  CREATININE 1.34*  CALCIUM 8.4*   GFR: Estimated Creatinine Clearance: 47.5 mL/min (A) (by C-G formula based on SCr of 1.34 mg/dL (H)). Liver Function Tests: Recent Labs  Lab 07/24/19 1122  AST 23  ALT 18  ALKPHOS 54  BILITOT 0.6  PROT 6.4*  ALBUMIN 3.5   Recent Labs  Lab 07/24/19 1122  LIPASE 26   No results for input(s): AMMONIA in the last 168 hours. Coagulation Profile: Recent Labs  Lab 07/24/19 1122  INR 1.1   Cardiac Enzymes: No results for  input(s): CKTOTAL, CKMB, CKMBINDEX, TROPONINI in the last 168 hours. BNP (last 3 results) No results for input(s): PROBNP in the last 8760 hours. HbA1C: No results for input(s): HGBA1C in the last 72 hours. CBG: No results for input(s): GLUCAP in the last 168 hours. Lipid Profile: No results for input(s): CHOL, HDL, LDLCALC, TRIG, CHOLHDL, LDLDIRECT in the last 72 hours. Thyroid Function Tests: No results for input(s): TSH, T4TOTAL, FREET4, T3FREE, THYROIDAB in the last 72 hours. Anemia Panel: No results for input(s): VITAMINB12, FOLATE, FERRITIN, TIBC, IRON, RETICCTPCT in the last 72 hours. Urine analysis:    Component Value Date/Time   COLORURINE YELLOW (A) 07/24/2019 1508   APPEARANCEUR CLEAR (A) 07/24/2019 1508   LABSPEC 1.006 07/24/2019 1508   PHURINE 6.0 07/24/2019 1508   GLUCOSEU NEGATIVE 07/24/2019 1508   HGBUR NEGATIVE 07/24/2019 1508   BILIRUBINUR NEGATIVE 07/24/2019 1508   KETONESUR NEGATIVE 07/24/2019 1508   PROTEINUR NEGATIVE 07/24/2019 1508   NITRITE NEGATIVE 07/24/2019 1508   LEUKOCYTESUR NEGATIVE 07/24/2019 1508   Sepsis Labs: @LABRCNTIP (procalcitonin:4,lacticidven:4) ) Recent Results (from the past 240 hour(s))  Novel Coronavirus, NAA ([facility])     Status: None   Collection Time: 07/20/19  2:19 PM   Specimen: Nasopharyngeal(NP) swabs in vial transport medium   NASOPHARYNGE  TESTING  Result Value Ref Range Status   SARS-CoV-2, NAA Not Detected Not Detected Final    Comment: This nucleic acid amplification test was developed and its performance characteristics determined by [employer]. Nucleic acid amplification tests include RT-PCR and TMA. This test has not been FDA cleared or approved. This test has been authorized by FDA under an Emergency Use Authorization (EUA). This test is only authorized for the duration of time the declaration that circumstances exist justifying the authorization of the emergency use of in vitro diagnostic tests for  detection of SARS-CoV-2 virus and/or diagnosis of COVID-19 infection under section 564(b)(1) of the Act, 21 U.S.C. [id number]) (1), unless the authorization is terminated or revoked sooner. When diagnostic testing is negative, the possibility of a false negative result should be considered in the context of a patient's recent exposures and the  presence of clinical signs and symptoms consistent with COVID-19. An individual without symptoms of COVID-19 and who is not shedding SARS-CoV-2 virus wo uld expect to have a negative (not detected) result in this assay.   Respiratory Panel by RT PCR (Flu A&B, Covid) - Nasopharyngeal Swab     Status: Abnormal   Collection Time: 07/24/19 12:32 PM   Specimen: Nasopharyngeal Swab  Result Value Ref Range Status   SARS Coronavirus 2 by RT PCR POSITIVE (A) NEGATIVE Final    Comment: RESULT CALLED TO, READ BACK BY AND VERIFIED WITH: BILL SMITH AT [id number] ON 07/24/2019 [place]. (NOTE) SARS-CoV-2 target nucleic acids are DETECTED. SARS-CoV-2 RNA is generally detectable in upper respiratory specimens  during the acute phase of infection. Positive results are indicative of the presence of the identified virus, but do not rule out bacterial infection or co-infection with other pathogens not detected by the test. Clinical correlation with patient history and other diagnostic information is necessary to determine patient infection status. The expected result is Negative. Fact Sheet for Patients:  PinkCheek.be Fact Sheet for Healthcare Providers: GravelBags.it This test is not yet approved or cleared by the [place] FDA and  has been authorized for detection and/or diagnosis of SARS-CoV-2 by FDA under an Emergency Use Authorization (EUA).  This EUA will remain in effect (meaning this test can be used)  for the duration of  the COVID-19 declaration under Section 564(b)(1) of the Act, 21 U.S.C. section  360bbb-3(b)(1), unless the authorization is terminated or revoked sooner.    Influenza A by PCR NEGATIVE NEGATIVE Final   Influenza B by PCR NEGATIVE NEGATIVE Final    Comment: (NOTE) The Xpert Xpress SARS-CoV-2/FLU/RSV assay is intended as an aid in  the diagnosis of influenza from Nasopharyngeal swab specimens and  should not be used as a sole basis for treatment. Nasal washings and  aspirates are unacceptable for Xpert Xpress SARS-CoV-2/FLU/RSV  testing. Fact Sheet for Patients: PinkCheek.be Fact Sheet for Healthcare Providers: GravelBags.it This test is not yet approved or cleared by the [place] FDA and  has been authorized for detection and/or diagnosis of SARS-CoV-2 by  FDA under an Emergency Use Authorization (EUA). This EUA will remain  in effect (meaning this test can be used) for the duration of the  Covid-19 declaration under Section 564(b)(1) of the Act, 21  U.S.C. section 360bbb-3(b)(1), unless the authorization is  terminated or revoked. Performed at [facility], [address] [place], [place] [address]      Radiological Exams on Admission: DG Chest [facility] 1 View  Result Date: 07/24/2019 CLINICAL DATA:  Altered mental status. Fever. COVID-19 positive on 07/20/2019. EXAM: PORTABLE CHEST 1 VIEW COMPARISON:  Chest x-ray dated 06/16/2019 FINDINGS: There is a small infiltrate at the left lung base posterior medially. There is slight haziness peripherally in the left mid and lower lung zone. There is also slight haziness at the right lung base with a small focal infiltrate medially. The heart size and pulmonary vascularity are normal. Bones are normal. IMPRESSION: Faint small bilateral pulmonary infiltrates, left greater than right. Electronically Signed   By: Lorriane Shire M.D.   On: 07/24/2019 11:50     EKG: Independently reviewed.  Sinus rhythm, QTC 389, low voltage, PAC, anteroseptal infarction  pattern.  Assessment/Plan Principal Problem:   Acute respiratory disease due to COVID-19 virus Active Problems:   Hypertension   Hyperlipidemia   Tobacco abuse   CAD (coronary artery disease)   COPD (chronic obstructive pulmonary disease) (  [place])   Anxiety   Acute metabolic encephalopathy   Acute renal failure superimposed on stage 2 chronic kidney disease ([place])   Hypotension   Acute respiratory disease due to COVID-19 virus: Oxygen desaturated to 87% on room air, currently on 2 L nasal cannula oxygen with oxygen saturation 95% on room air.  Chest x-ray with bilateral multifocal infiltration.  -will admit to med-surg bed as inpt -Remdesivir per pharm -Solumedrol 40 mg bid -vitamin C, zinc.  -Bronchodilators -f/u Blood culture -Gentle IV fluid:  -D-dimer, BNP,Trop, LFT, CRP, LDH, Procalcitonin, Ferritin, fibinogen, TG, Hep B SAg, HIV ab -Daily CRP, Ferritin, D-dimer, -Will ask the patient to maintain an awake prone position for 16+ hours a day, if possible, with a minimum of 2-3 hours at a time -Will attempt to maintain euvolemia to a net negative fluid status -IF patient deteriorates, will consult PCCM and ID  Hypertension: -hold Bp med due to hypotension  Hyperlipidemia: -lipitor  Tobacco abuse: -nicotine patch  CAD (coronary artery disease): no CP -lipitor and prn NTG  COPD (chronic obstructive pulmonary disease) ([place]): -Bronchodilators  Anxiety: -prn Xanax  Acute metabolic encephalopathy: Resolved -Frequent neuro check  Acute renal failure superimposed on stage 2 chronic kidney disease ([place]): Baseline creatinine 1.0.  His creatinine is 1.34, BUN 30.  Possibly due to dehydration -IV fluid as above  Hypotension: Patient was initially hypotensive with blood pressure 84/43, which improved to 125/77 after received 2.5 LR.  Lactic acid normal 1.1.  Patient clinically responded to IV fluid.  Does not seem to be due to sepsis.  Possibly due to dehydration. -IV fluid  as above    Inpatient status:  # Patient requires inpatient status due to high intensity of service, high risk for further deterioration and high frequency of surveillance required.  I certify that at the point of admission it is my clinical judgment that the patient will require inpatient hospital care spanning beyond 2 midnights from the point of admission.  . This patient has multiple chronic comorbidities including hypertension, hyperlipidemia, COPD, depression, anxiety, tobacco abuse, CKD stage II, CAD. Marland Kitchen Now patient has presenting with acute respiratory disease due to COVID-19 virus with hypoxia, also has worsening renal function, hypotension . The worrisome physical exam findings include dry mucous membrane . The initial radiographic and laboratory data are worrisome because of positive COVID-19 test, worsening renal function, bilateral pulmonary infiltration on chest x-ray . Current medical needs: please see my assessment and plan . Predictability of an adverse outcome (risk): Patient has multiple comorbidities as listed above. Now presents with acute respiratory disease due to COVID-19 virus with hypoxia, also has worsening renal function, hypotension. Patient's presentation is highly complicated.  Patient is at high risk of deteriorating.  Will need to be treated in hospital for at least 2 days.              DVT ppx: SQ Lovenox Code Status: partial code (OK for CPR, no intubation per his wife). Family Communication:  Yes, patient's wife by phone Disposition Plan:  Anticipate discharge back to previous home environment Consults called:  none Admission status: Med-surg bed  as inpt       Date of Service 07/24/2019    [facility]   If 7PM-7AM, please contact night-coverage [email or web address] 07/24/2019, 8:10 PM
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
## 2019-07-24 NOTE — Progress Notes (Signed)
Code Sepsis monitoring discontinued due to cancellation.  Order DC by Dr Carrie Mew at 1420, elink with no longer monitor sepsis

## 2019-07-24 NOTE — ED Notes (Signed)
E signature pad not working. Pt signed paper copy for inclusion in record.

## 2019-07-24 NOTE — Progress Notes (Signed)
Patient arrived to room [address] from [facility] ED.  Assessment complete, VS obtained, and Admission database began.

## 2019-07-24 NOTE — Telephone Encounter (Signed)
[place] RECORD AccessNurse Patient Name: Karl Myers) [place] S Gender: Male DOB: 08-24-1945 Age: 74 Y 10 M 21 D Return Phone Number: [phone] (Primary) Address: City/State/Zip: [place] [address] Client  Primary Care [facility] [place] [facility] - [facility] Client Site [place] - Day Physician Tommi Rumps - MD Contact Type Call Who Is Calling Patient / Member / Family / Caregiver Call Type Triage / Clinical Caller Name Delmo Radakovich Relationship To Patient Spouse Return Phone Number [phone] (Primary) Chief Complaint Chest Pain (non urgent symptoms) Reason for Call Symptomatic / Request for [place] stated she is calling for her husband. Caller stated her husband is running a fever of 100.6. Caller stated her husband is very confused and he is forgetting to swallow. [place] Not Listed [facility] Translation No Nurse Assessment Nurse: Vallery Sa, RN, [place] Date/Time (Eastern Time): 07/24/2019 9:53:27 AM Confirm and document reason for call. If symptomatic, describe symptoms. ---Caller states that Karl Myers developed fever, confusion and swallowing difficulty about 3-4 days ago. No severe breathing difficulty or blueness around his lips. He has chest pain with coughing (rated as a 9 on the 1 to 10 scale). Exposed to COVID-19 on 07/14/10 (his wife is positive). Has the patient had close contact with a person known or suspected to have the novel coronavirus illness OR traveled / lives in area with major community spread (including international travel) in the last 14 days from the onset of symptoms? * If Asymptomatic, screen for exposure and travel within the last 14 days. ---Yes Does the patient have any new or worsening symptoms? ---Yes Will a triage be completed? ---Yes Related visit to physician within the last 2 weeks?  ---Yes Does the PT have any chronic conditions? (i.e. diabetes, asthma, this includes High risk factors for pregnancy, etc.) ---Yes List chronic conditions. ---COPD, High Blood Pressure and Cholesterol, COVID-19? Is this a behavioral health or substance abuse call? ---No PLEASE NOTE: All timestamps contained within this report are represented as [place] Standard Time. CONFIDENTIALTY NOTICE: This fax transmission is intended only for the addressee. It contains information that is legally privileged, confidential or otherwise protected from use or disclosure. If you are not the intended recipient, you are strictly prohibited from reviewing, disclosing, copying using or disseminating any of this information or taking any action in reliance on or regarding this information. If you have received this fax in error, please notify us immediately by telephone so that we can arrange for its return to [place]. Phone: [phone], Toll-Free: [phone], Fax: [phone] Page: 2 of 2 Call Id: [id number] Guidelines Guideline Title Affirmed Question Affirmed Notes Nurse Date/Time Eilene Ghazi Time) Chest Pain SEVERE chest pain Vallery Sa, RN, [facility] 07/24/2019 9:57:27 AM Disp. Time Eilene Ghazi Time) Disposition Final User 07/24/2019 9:48:11 AM Send to Urgent Queue Money, Hildred Alamin 07/24/2019 10:03:15 AM Go to ED Now Yes Vallery Sa, RN, [place] [place] Disagree/Comply Comply Caller Understands Yes PreDisposition Go to ED Care Advice Given Per Guideline GO TO ED NOW: * You need to be seen in the Emergency Department. * Go to the ED at ___________ [place] now. Drive carefully. NOTE TO TRIAGER - DRIVING: * Another adult should drive. * If immediate transportation is not available via car or taxi, then the patient should be instructed to call [id number]. BRING MEDICINES: * Please bring a list of your current medicines when you go to the Emergency Department (ER). * It is also a good idea  to bring the pill bottles too. This will  help the doctor to make certain you are taking the right medicines and the right dose. NOTHING BY MOUTH: Do not eat or drink anything for now. CALL EMS IF: * Severe difficulty breathing occurs * Passes out or becomes too weak to stand * You become worse. CARE ADVICE given per Chest Pain (Adult) guideline.

## 2019-07-24 NOTE — ED Triage Notes (Signed)
Pt presents to ED via POV with his wife. Per wife, she tested positive on 2/11, however patient tested negative. Pt's wife reports fever at home of 100.7, gave Tylenol this AM at 0830, a-febrile upon arrival. Pt's wife reports increasing confusion and cough and fever at home. Initial BP in triage 84/43, repeat BP 84/43.     75/46

## 2019-07-24 NOTE — Telephone Encounter (Signed)
Pt's wife called and states that pt is very confused this morning. He is running a fever of 100.6 and went to [facility] on Saturday to get a covid test. They have not gotten results back yet. Pt's wife has tested positive for Covid. She states that Fritz Pickerel is stumbling around and has forgotten to swallow. He also keeps asking her the same questions over and over. I transferred to Access Nurse to triage.

## 2019-07-24 NOTE — Telephone Encounter (Signed)
Patient seen in ER DX [place] due to COVID 19.

## 2019-07-24 NOTE — H&P (Signed)
History and Physical    Karl Myers [id number] DOB: Feb 13, 1946 DOA: 07/24/2019  PCP: Leone Haven, MD   Patient coming from: Home   Chief Complaint: Confusion, fever, SOB, cough   HPI: Karl Myers is a 74 y.o. male with medical history significant for coronary artery disease, COPD, chronic low back pain, hypertension, and anxiety, now presenting to the emergency department for evaluation of confusion, fevers, cough, and shortness of breath.  Patient reportedly developed symptoms on 07/20/2019, he tested negative for COVID-19 initially though his wife was positive.  Since onset of symptoms, his condition has worsened, he had nausea with vomiting yesterday but no abdominal pain, and then seem to have some mild confusion today which prompted his presentation to the ED.  The patient had been at a funeral home on 08-09-19 after his sister died of complications of COVID and he was later informed that the employee he was interacting with developed COVID. He and his wife began to feel ill on 07/20/19. He was treated with acetaminophen at home prior to arrival in the ED. The patient is alert and fully oriented on arrival to [facility]. He clearly states that he would not want CPR, shocks, or intubation in a code situation.   [facility] ED Course: Upon arrival to the ED, patient is found to be afebrile, saturating upper 80s on room air, slightly tachypneic, mildly tachycardic, and with blood pressure 84/43.  EKG features sinus rhythm with PACs and chest x-ray notable for faint bilateral infiltrates.  Chemistry panel features a sodium of 131, BUN 30, and creatinine of 1.34, up from 0.92 last March.  CBC was unremarkable.  Covid 19 PCR is positive.  Procalcitonin 0.11 and lactic acid 1.1.  Blood and urine cultures were collected in the emergency department and the patient was given 2.25 L of lactated Ringer's, initially treated with Rocephin and azithromycin before the Covid test was positive and  procalcitonin found to be low.  He was started on 2 L/min of supplemental oxygen, admitted to the hospitalist service, given systemic steroids and remdesivir, and transferred to [facility] for ongoing evaluation and management.  Review of Systems:  All other systems reviewed and apart from HPI, are negative.  Past Medical History:  Diagnosis Date  . Arthritis   . COPD (chronic obstructive pulmonary disease) ([place])   . DDD (degenerative disc disease), cervical   . DDD (degenerative disc disease), cervical   . Depression   . Hypertension   . Pre-diabetes     Past Surgical History:  Procedure Laterality Date  . BACK SURGERY     x 3  . CARPAL TUNNEL RELEASE Right 08/19/2017   Procedure: CARPAL TUNNEL RELEASE;  Surgeon: Hessie Knows, MD;  Location: [facility] ORS;  Service: Orthopedics;  Laterality: Right;  . CATARACT EXTRACTION W/PHACO Right 11/17/2016   Procedure: CATARACT EXTRACTION PHACO AND INTRAOCULAR LENS PLACEMENT (IOC);  Surgeon: Birder Robson, MD;  Location: [facility] ORS;  Service: Ophthalmology;  Laterality: Right;  [place] 00:37 AP% 12.6 CDE 4.69 Fluid pack lot # [id number]  . CATARACT EXTRACTION W/PHACO Left 12/08/2016   Procedure: CATARACT EXTRACTION PHACO AND INTRAOCULAR LENS PLACEMENT (IOC);  Surgeon: Birder Robson, MD;  Location: [facility] ORS;  Service: Ophthalmology;  Laterality: Left;  [place] 00:33 AP% 13.9 CDE 4.63 Fluid pack lot # [id number]  . ULNAR TUNNEL RELEASE Right 08/19/2017   Procedure: CUBITAL TUNNEL RELEASE;  Surgeon: Hessie Knows, MD;  Location: [facility] ORS;  Service: Orthopedics;  Laterality: Right;     reports  that he has been smoking. He has been smoking about 1.00 pack per day. He has never used smokeless tobacco. He reports that he does not drink alcohol or use drugs.  Allergies  Allergen Reactions  . Hydrocodone-Acetaminophen Other (See Comments)    With large quantities "hyper"  . Neurontin [Gabapentin] Other (See Comments)    With large quantities "hyper"       Family History  Problem Relation Age of Onset  . Heart attack Mother   . Prostate cancer Father   . Breast cancer Sister 25  . Dementia Sister   . Cancer Brother        unknown what kind     Prior to Admission medications   Medication Sig Start Date End Date Taking? Authorizing Provider  ALPRAZolam (XANAX) 0.5 MG tablet TAKE ONE-HALF TABLET BY MOUTH EVERY MORNING AND TAKE ONE TABLET BY MOUTH AT BEDTIME AS DIRECTED Patient taking differently: Take 0.5 mg by mouth at bedtime.  06/29/19   Leone Haven, MD  atenolol (TENORMIN) 25 MG tablet TAKE ONE TABLET AT BEDTIME 11/28/18   Leone Haven, MD  atorvastatin (LIPITOR) 40 MG tablet TAKE ONE TABLET BY MOUTH EVERY DAY Patient taking differently: Take 40 mg by mouth at bedtime.  02/21/19   Leone Haven, MD  benzonatate (TESSALON) 200 MG capsule Take 1 capsule (200 mg total) by mouth 2 (two) times daily as needed for cough. 07/21/19   Leone Haven, MD  cholecalciferol (VITAMIN D3) 25 MCG (1000 UNIT) tablet Take 1,000 Units by mouth daily.    [provider]  lisinopril (ZESTRIL) 10 MG tablet TAKE ONE TABLET AT BEDTIME 11/28/18   Leone Haven, MD  nitroGLYCERIN (NITROSTAT) 0.4 MG SL tablet Place 1 tablet (0.4 mg total) under the tongue every 5 (five) minutes as needed for chest pain. For up to 3 doses per episode. 02/21/19   Leone Haven, MD  predniSONE (DELTASONE) 10 MG tablet 6 tablets daily for 3 days, then reduce by 1 tablet daily until gone 07/17/19   Crecencio Mc, MD  tiZANidine (ZANAFLEX) 4 MG tablet Take 1 tablet (4 mg total) by mouth every 6 (six) hours as needed for muscle spasms. 07/17/19   Crecencio Mc, MD  traZODone (DESYREL) 150 MG tablet TAKE ONE TABLET AT BEDTIME 05/26/19   Leone Haven, MD  vitamin C (ASCORBIC ACID) 250 MG tablet Take 250 mg by mouth daily.    [provider]  Zinc 100 MG TABS Take 100 mg by mouth daily.    [provider]    Physical  Exam: Vitals:   07/24/19 2102  BP: (!) 158/80  Pulse: 69  Resp: 20  Temp: 97.6 F (36.4 C)  TempSrc: Oral  SpO2: 98%    Constitutional: NAD, calm  Eyes: PERTLA, lids and conjunctivae normal ENMT: Mucous membranes are moist. Posterior pharynx clear of any exudate or lesions.   Neck: normal, supple, no masses, no thyromegaly Respiratory: Speaking full sentences, no wheezing, no crackles. No accessory muscle use.  Cardiovascular: S1 & S2 heard, regular rate and rhythm. No extremity edema.   Abdomen: No distension, no tenderness, soft. Bowel sounds active.  Musculoskeletal: no clubbing / cyanosis. No joint deformity upper and lower extremities.   Skin: no significant rashes, lesions, ulcers. Poor turgor. Neurologic: No facial asymmetry. Sensation intact Moving all extremities.  Psychiatric: Alert and oriented to person, place, time, and situation. Pleasant and cooperative.    Labs and Imaging on Admission:  I have personally reviewed following labs and imaging studies  CBC: Recent Labs  Lab 07/24/19 1122  WBC 8.4  NEUTROABS 6.2  HGB 13.4  HCT 40.3  MCV 97.1  PLT [id number]   Basic Metabolic Panel: Recent Labs  Lab 07/24/19 1122  NA 131*  K 3.9  CL 97*  CO2 25  GLUCOSE 116*  BUN 30*  CREATININE 1.34*  CALCIUM 8.4*   GFR: Estimated Creatinine Clearance: 47.5 mL/min (A) (by C-G formula based on SCr of 1.34 mg/dL (H)). Liver Function Tests: Recent Labs  Lab 07/24/19 1122  AST 23  ALT 18  ALKPHOS 54  BILITOT 0.6  PROT 6.4*  ALBUMIN 3.5   Recent Labs  Lab 07/24/19 1122  LIPASE 26   No results for input(s): AMMONIA in the last 168 hours. Coagulation Profile: Recent Labs  Lab 07/24/19 1122  INR 1.1   Cardiac Enzymes: No results for input(s): CKTOTAL, CKMB, CKMBINDEX, TROPONINI in the last 168 hours. BNP (last 3 results) No results for input(s): PROBNP in the last 8760 hours. HbA1C: No results for input(s): HGBA1C in the last 72 hours. CBG: No results for  input(s): GLUCAP in the last 168 hours. Lipid Profile: No results for input(s): CHOL, HDL, LDLCALC, TRIG, CHOLHDL, LDLDIRECT in the last 72 hours. Thyroid Function Tests: No results for input(s): TSH, T4TOTAL, FREET4, T3FREE, THYROIDAB in the last 72 hours. Anemia Panel: No results for input(s): VITAMINB12, FOLATE, FERRITIN, TIBC, IRON, RETICCTPCT in the last 72 hours. Urine analysis:    Component Value Date/Time   COLORURINE YELLOW (A) 07/24/2019 1508   APPEARANCEUR CLEAR (A) 07/24/2019 1508   LABSPEC 1.006 07/24/2019 1508   PHURINE 6.0 07/24/2019 1508   GLUCOSEU NEGATIVE 07/24/2019 1508   HGBUR NEGATIVE 07/24/2019 1508   BILIRUBINUR NEGATIVE 07/24/2019 1508   KETONESUR NEGATIVE 07/24/2019 1508   PROTEINUR NEGATIVE 07/24/2019 1508   NITRITE NEGATIVE 07/24/2019 1508   LEUKOCYTESUR NEGATIVE 07/24/2019 1508   Sepsis Labs: @LABRCNTIP (procalcitonin:4,lacticidven:4) ) Recent Results (from the past 240 hour(s))  Novel Coronavirus, NAA ([facility])     Status: None   Collection Time: 07/20/19  2:19 PM   Specimen: Nasopharyngeal(NP) swabs in vial transport medium   NASOPHARYNGE  TESTING  Result Value Ref Range Status   SARS-CoV-2, NAA Not Detected Not Detected Final    Comment: This nucleic acid amplification test was developed and its performance characteristics determined by [employer]. Nucleic acid amplification tests include RT-PCR and TMA. This test has not been FDA cleared or approved. This test has been authorized by FDA under an Emergency Use Authorization (EUA). This test is only authorized for the duration of time the declaration that circumstances exist justifying the authorization of the emergency use of in vitro diagnostic tests for detection of SARS-CoV-2 virus and/or diagnosis of COVID-19 infection under section 564(b)(1) of the Act, 21 U.S.C. [id number]) (1), unless the authorization is terminated or revoked sooner. When diagnostic testing is negative, the  possibility of a false negative result should be considered in the context of a patient's recent exposures and the presence of clinical signs and symptoms consistent with COVID-19. An individual without symptoms of COVID-19 and who is not shedding SARS-CoV-2 virus wo uld expect to have a negative (not detected) result in this assay.   Respiratory Panel by RT PCR (Flu A&B, Covid) - Nasopharyngeal Swab     Status: Abnormal   Collection Time: 07/24/19 12:32 PM   Specimen: Nasopharyngeal Swab  Result Value Ref Range Status   SARS Coronavirus 2 by  RT PCR POSITIVE (A) NEGATIVE Final    Comment: RESULT CALLED TO, READ BACK BY AND VERIFIED WITH: BILL SMITH AT [id number] ON 07/24/2019 [place]. (NOTE) SARS-CoV-2 target nucleic acids are DETECTED. SARS-CoV-2 RNA is generally detectable in upper respiratory specimens  during the acute phase of infection. Positive results are indicative of the presence of the identified virus, but do not rule out bacterial infection or co-infection with other pathogens not detected by the test. Clinical correlation with patient history and other diagnostic information is necessary to determine patient infection status. The expected result is Negative. Fact Sheet for Patients:  PinkCheek.be Fact Sheet for Healthcare Providers: GravelBags.it This test is not yet approved or cleared by the [place] FDA and  has been authorized for detection and/or diagnosis of SARS-CoV-2 by FDA under an Emergency Use Authorization (EUA).  This EUA will remain in effect (meaning this test can be used)  for the duration of  the COVID-19 declaration under Section 564(b)(1) of the Act, 21 U.S.C. section 360bbb-3(b)(1), unless the authorization is terminated or revoked sooner.    Influenza A by PCR NEGATIVE NEGATIVE Final   Influenza B by PCR NEGATIVE NEGATIVE Final    Comment: (NOTE) The Xpert Xpress SARS-CoV-2/FLU/RSV assay is  intended as an aid in  the diagnosis of influenza from Nasopharyngeal swab specimens and  should not be used as a sole basis for treatment. Nasal washings and  aspirates are unacceptable for Xpert Xpress SARS-CoV-2/FLU/RSV  testing. Fact Sheet for Patients: PinkCheek.be Fact Sheet for Healthcare Providers: GravelBags.it This test is not yet approved or cleared by the [place] FDA and  has been authorized for detection and/or diagnosis of SARS-CoV-2 by  FDA under an Emergency Use Authorization (EUA). This EUA will remain  in effect (meaning this test can be used) for the duration of the  Covid-19 declaration under Section 564(b)(1) of the Act, 21  U.S.C. section 360bbb-3(b)(1), unless the authorization is  terminated or revoked. Performed at [facility], [address] [place], [place] [address]      Radiological Exams on Admission: DG Chest [facility] 1 View  Result Date: 07/24/2019 CLINICAL DATA:  Altered mental status. Fever. COVID-19 positive on 07/20/2019. EXAM: PORTABLE CHEST 1 VIEW COMPARISON:  Chest x-ray dated 06/16/2019 FINDINGS: There is a small infiltrate at the left lung base posterior medially. There is slight haziness peripherally in the left mid and lower lung zone. There is also slight haziness at the right lung base with a small focal infiltrate medially. The heart size and pulmonary vascularity are normal. Bones are normal. IMPRESSION: Faint small bilateral pulmonary infiltrates, left greater than right. Electronically Signed   By: Lorriane Shire M.D.   On: 07/24/2019 11:50    EKG: Independently reviewed. Sinus rhythm with PAC's.   Assessment/Plan   1. COVID-19; acute hypoxic respiratory failure  - Presents with mild confusion, SOB, cough, and fevers, found to have positive COVID pcr, faint infiltrates b/l on CXR, low procalcitonin, and new 2 Lpm supplemental O2 requirement  - Continue supplemental  O2 as needed, continue systemic steroid and remdesivir, check/trend markers    2. Acute encephalopathy  - Likely toxic-metabolic etiology, has resolved with IVF and antipyretics   - He is alert on arrival to [facility] and oriented x4   3. Acute kidney injury  - SCr is 1.34 in ED, up from an apparent baseline of 0.9  - Likely prerenal azotemia or multifactorial in setting of apparent hypovolemia, low BP, ACE-inhibition  - He was  fluid-resuscitated in ED, antihypertensives will be held, medications renally-dosed, and chem panel repeated in am    4. CAD  - No anginal complaints  - Continue Lipitor    5. Anxiety  - Continue low-dose Xanax   6. COPD  - No wheezing, no sputum production  - Continue bronchodilators    7. Hyponatremia  - Serum sodium 131 in ED in setting of hypovolemia  - He was fluid-resuscitated in ED, will hold IVF for now and repeat chem panel in am    8. Hypotension - BP was 84/43 in ED, likely secondary to hypovolemia and normalized with IVF  - Hold antihypertensives initially and resume as needed     DVT prophylaxis: Lovenox Code Status: DNR  Family Communication: Wife was updated from ED  Disposition Plan: Patient coming from home and will likely return there once respiratory status improved and patient stable on room air.   Consults called: None Admission status: Inpatient     Vianne Bulls, MD [facility] Pager: See [email or web address]  If 7AM-7PM, please contact the daytime attending [email or web address]  07/24/2019, 9:45 PM

## 2019-07-24 NOTE — Telephone Encounter (Signed)
Left message to return call to office.

## 2019-07-24 NOTE — Progress Notes (Signed)
CODE SEPSIS - PHARMACY COMMUNICATION  **Broad Spectrum Antibiotics should be administered within 1 hour of Sepsis diagnosis**  Time Code Sepsis Called/Page Received: [id number]  Antibiotics Ordered: ceftriaxone/azithromycin  Time of 1st antibiotic administration: 1227  Additional action taken by pharmacy: NA  If necessary, Name of Provider/Nurse Contacted: NA    Tawnya Crook ,PharmD Clinical Pharmacist  07/24/2019  12:28 PM

## 2019-07-24 NOTE — Consult Note (Signed)
Remdesivir - Pharmacy Brief Note   O:  ALT: 18 CXR: Faint small bilateral pulmonary infiltrates, left greater than right. SpO2: 88% on RA   A/P:  Remdesivir 200 mg IVPB once followed by 100 mg IVPB daily x 4 days.   Pearla Dubonnet, PharmD Clinical Pharmacist 07/24/2019 2:34 PM

## 2019-07-25 LAB — D-DIMER, QUANTITATIVE: D-Dimer, Quant: 0.49 ug/mL-FEU (ref 0.00–0.50)

## 2019-07-25 LAB — CBC WITH DIFFERENTIAL/PLATELET
Abs Immature Granulocytes: 0.05 10*3/uL (ref 0.00–0.07)
Basophils Absolute: 0 10*3/uL (ref 0.0–0.1)
Basophils Relative: 0 %
Eosinophils Absolute: 0 10*3/uL (ref 0.0–0.5)
Eosinophils Relative: 0 %
HCT: 41 % (ref 39.0–52.0)
Hemoglobin: 13.9 g/dL (ref 13.0–17.0)
Immature Granulocytes: 1 %
Lymphocytes Relative: 14 %
Lymphs Abs: 1 10*3/uL (ref 0.7–4.0)
MCH: 32.9 pg (ref 26.0–34.0)
MCHC: 33.9 g/dL (ref 30.0–36.0)
MCV: 96.9 fL (ref 80.0–100.0)
Monocytes Absolute: 0.6 10*3/uL (ref 0.1–1.0)
Monocytes Relative: 8 %
Neutro Abs: 5.2 10*3/uL (ref 1.7–7.7)
Neutrophils Relative %: 77 %
Platelets: 148 10*3/uL — ABNORMAL LOW (ref 150–400)
RBC: 4.23 MIL/uL (ref 4.22–5.81)
RDW: 14.2 % (ref 11.5–15.5)
WBC: 6.7 10*3/uL (ref 4.0–10.5)
nRBC: 0 % (ref 0.0–0.2)

## 2019-07-25 LAB — COMPREHENSIVE METABOLIC PANEL
ALT: 30 U/L (ref 0–44)
AST: 33 U/L (ref 15–41)
Albumin: 3.3 g/dL — ABNORMAL LOW (ref 3.5–5.0)
Alkaline Phosphatase: 55 U/L (ref 38–126)
Anion gap: 11 (ref 5–15)
BUN: 28 mg/dL — ABNORMAL HIGH (ref 8–23)
CO2: 26 mmol/L (ref 22–32)
Calcium: 8.6 mg/dL — ABNORMAL LOW (ref 8.9–10.3)
Chloride: 101 mmol/L (ref 98–111)
Creatinine, Ser: 0.93 mg/dL (ref 0.61–1.24)
GFR calc Af Amer: >60 mL/min (ref >60)
GFR calc non Af Amer: >60 mL/min (ref >60)
Glucose, Bld: 134 mg/dL — ABNORMAL HIGH (ref 70–99)
Potassium: 4.6 mmol/L (ref 3.5–5.1)
Sodium: 138 mmol/L (ref 135–145)
Total Bilirubin: 0.3 mg/dL (ref 0.3–1.2)
Total Protein: 6 g/dL — ABNORMAL LOW (ref 6.5–8.1)

## 2019-07-25 LAB — FERRITIN: Ferritin: 430 ng/mL — ABNORMAL HIGH (ref 24–336)

## 2019-07-25 LAB — URINE CULTURE: Culture: NO GROWTH

## 2019-07-25 LAB — MAGNESIUM: Magnesium: 1.8 mg/dL (ref 1.7–2.4)

## 2019-07-25 LAB — ABO/RH: ABO/RH(D): A POS

## 2019-07-25 LAB — HEMOGLOBIN A1C
Hgb A1c MFr Bld: 6.5 % — ABNORMAL HIGH (ref 4.8–5.6)
Mean Plasma Glucose: 139.85 mg/dL

## 2019-07-25 LAB — C-REACTIVE PROTEIN: CRP: 5.6 mg/dL — ABNORMAL HIGH (ref <1.0)

## 2019-07-25 LAB — GLUCOSE, CAPILLARY: Glucose-Capillary: 191 mg/dL — ABNORMAL HIGH (ref 70–99)

## 2019-07-25 MED ORDER — HYDROCOD POLST-CPM POLST ER 10-8 MG/5ML PO SUER
5.0000 mL | Freq: Two times a day (BID) | ORAL | Status: DC | PRN
  Administered 2019-07-25 – 2019-07-27 (×3): 5 mL via ORAL
  Filled 2019-07-25 (×3): qty 5

## 2019-07-25 NOTE — Plan of Care (Signed)
  Problem: Education: ?Goal: Knowledge of General Education information will improve ?Description: Including pain rating scale, medication(s)/side effects and non-pharmacologic comfort measures ?Outcome: Progressing ?  ?Problem: Health Behavior/Discharge Planning: ?Goal: Ability to manage health-related needs will improve ?Outcome: Progressing ?  ?Problem: Clinical Measurements: ?Goal: Ability to maintain clinical measurements within normal limits will improve ?Outcome: Progressing ?Goal: Will remain free from infection ?Outcome: Progressing ?Goal: Diagnostic test results will improve ?Outcome: Progressing ?Goal: Respiratory complications will improve ?Outcome: Progressing ?Goal: Cardiovascular complication will be avoided ?Outcome: Progressing ?  ?Problem: Coping: ?Goal: Level of anxiety will decrease ?Outcome: Progressing ?  ?Problem: Elimination: ?Goal: Will not experience complications related to bowel motility ?Outcome: Progressing ?Goal: Will not experience complications related to urinary retention ?Outcome: Progressing ?  ?Problem: Pain Managment: ?Goal: General experience of comfort will improve ?Outcome: Progressing ?  ?

## 2019-07-25 NOTE — Progress Notes (Signed)
Occupational Therapy Evaluation Patient Details Name: Karl Myers MRN: [id number] DOB: 05-14-1946 Today's Date: 07/25/2019    History of Present Illness 74 y.o. male with medical history significant for coronary artery disease, COPD, chronic low back pain, hypertension, and anxiety, now presenting to the emergency department for evaluation of confusion, fevers, cough, and shortness of breath.   Clinical Impression   Patient is very pleasant.  He lives at in a one story home with wife and was independent prior level.  He has assistive devices at home but does not use them.  Patient performed mobility with rolling walker as he felt more stable. Demonstrated slight balance deficits without it.  He was supervision for mobility and standing ADLs.  Walked 139ft and tired by end but showed no shortness of breath.   Completed education on flutter valve and incentive spirometer.  Patient remained on room air throughout session and SpO2> 95. Will continue to follow with OT acutely to address the deficits listed below to ensure a safe discharge home.      Follow Up Recommendations  No OT follow up;Supervision - Intermittent    Equipment Recommendations  None recommended by OT    Recommendations for Other Services       Precautions / Restrictions Precautions Precautions: None Restrictions Weight Bearing Restrictions: No      Mobility Bed Mobility Overal bed mobility: Modified Independent                Transfers Overall transfer level: Needs assistance Equipment used: Rolling walker (2 wheeled) Transfers: Sit to/from Stand Sit to Stand: Supervision              Balance Overall balance assessment: Mild deficits observed, not formally tested                                         ADL either performed or assessed with clinical judgement   ADL Overall ADL's : Needs assistance/impaired Eating/Feeding: Independent;Sitting   Grooming:  Supervision/safety;Standing   Upper Body Bathing: Supervision/ safety;Standing   Lower Body Bathing: Supervison/ safety;Sit to/from stand   Upper Body Dressing : Supervision/safety;Standing   Lower Body Dressing: Supervision/safety;Sit to/from stand   Toilet Transfer: Supervision/safety;Ambulation           Functional mobility during ADLs: Supervision/safety;Rolling walker General ADL Comments: Did not use RW at prior level but patient's balance is slighlty insteady and he said he prefers to use the walker now.     Vision         Perception     Praxis      Pertinent Vitals/Pain Pain Assessment: No/denies pain     Hand Dominance Right   Extremity/Trunk Assessment Upper Extremity Assessment Upper Extremity Assessment: Generalized weakness           Communication Communication Communication: No difficulties   Cognition Arousal/Alertness: Awake/alert Behavior During Therapy: WFL for tasks assessed/performed Overall Cognitive Status: Within Functional Limits for tasks assessed                                     General Comments       Exercises Exercises: Other exercises Other Exercises Other Exercises: x 10 flutter valve Other Exercises: x10 incentive spirometer Other Exercises: 120ft walk in hall   Shoulder Instructions      Home Living  Family/patient expects to be discharged to:: Private residence Living Arrangements: Spouse/significant other Available Help at Discharge: Family Type of Home: House Home Access: Stairs to enter Technical brewer of Steps: 1 Entrance Stairs-Rails: Left Home Layout: One level     Bathroom Shower/Tub: Occupational psychologist: Standard Bathroom Accessibility: Yes   Home Equipment: Environmental consultant - 2 wheels;Shower seat - built in          Prior Functioning/Environment Level of Independence: Independent                 OT Problem List: Decreased strength;Decreased activity  tolerance;Impaired balance (sitting and/or standing);Cardiopulmonary status limiting activity      OT Treatment/Interventions: Self-care/ADL training;Therapeutic exercise;Energy conservation;Therapeutic activities;Balance training;Patient/family education    OT Goals(Current goals can be found in the care plan section) Acute Rehab OT Goals Patient Stated Goal: Go home to wife OT Goal Formulation: With patient Time For Goal Achievement: 08/08/19 Potential to Achieve Goals: Good  OT Frequency: Min 3X/week   Barriers to D/C:            Co-evaluation              AM-PAC OT "6 Clicks" Daily Activity     Outcome Measure Help from another person eating meals?: None Help from another person taking care of personal grooming?: A Little Help from another person toileting, which includes using toliet, bedpan, or urinal?: A Little Help from another person bathing (including washing, rinsing, drying)?: A Little Help from another person to put on and taking off regular upper body clothing?: A Little Help from another person to put on and taking off regular lower body clothing?: A Little 6 Click Score: 19   End of Session Equipment Utilized During Treatment: Rolling walker Nurse Communication: Mobility status  Activity Tolerance: Patient tolerated treatment well Patient left: in chair;with call bell/phone within reach  OT Visit Diagnosis: Unsteadiness on feet (R26.81);Muscle weakness (generalized) (M62.81)                Time: [id number] OT Time Calculation (min): 24 min Charges:  OT General Charges $OT Visit: 1 Visit OT Evaluation $OT Eval Moderate Complexity: 1 Mod OT Treatments $Self Care/Home Management : 8-22 mins  Karl Myers, OTR/L   Karl Myers 07/25/2019, 12:43 PM

## 2019-07-25 NOTE — Progress Notes (Signed)
Progress Note    Karl Myers [id number] DOB: 08/28/1945 DOA: 07/24/2019  PCP: Leone Haven, MD   Patient coming from: Home   Chief Complaint: Confusion, fever, SOB, cough   HPI: Karl Myers is a 74 y.o. male with medical history significant for coronary artery disease, COPD, chronic low back pain, hypertension, and anxiety, now presenting to the emergency department for evaluation of confusion, fevers, cough, and shortness of breath.  Patient reportedly developed symptoms on 07/20/2019, he tested negative for COVID-19 initially though his wife was positive.  Since onset of symptoms, his condition has worsened, he had nausea with vomiting yesterday but no abdominal pain, and then seem to have some mild confusion today which prompted his presentation to the ED.  The patient had been at a funeral home on 07/28/19 after his sister died of complications of COVID and he was later informed that the employee he was interacting with developed COVID. He and his wife began to feel ill on 07/20/19. He was treated with acetaminophen at home prior to arrival in the ED. The patient is alert and fully oriented on arrival to [facility]. He clearly states that he would not want CPR, shocks, or intubation in a code situation. Upon arrival to the ED, patient is found to be afebrile, saturating upper 80s on room air, slightly tachypneic, mildly tachycardic, and with blood pressure 84/43.  EKG features sinus rhythm with PACs and chest x-ray notable for faint bilateral infiltrates.  Chemistry panel features a sodium of 131, BUN 30, and creatinine of 1.34, up from 0.92 last March.  CBC was unremarkable.  Covid 19 PCR is positive.  Procalcitonin 0.11 and lactic acid 1.1.  Blood and urine cultures were collected in the emergency department and the patient was given 2.25 L of lactated Ringer's, initially treated with Rocephin and azithromycin before the Covid test was positive and procalcitonin found to be low.  He was  started on 2 L/min of supplemental oxygen, admitted to the hospitalist service, given systemic steroids and remdesivir, and transferred to [facility] for ongoing evaluation and management.  Subjective:  No acute issues or events overnight, symptoms minimally improving, ongoing dyspnea with exertion.  Declines chest pain, nausea, vomiting, diarrhea, constipation, headache, fevers, chills.  Assessment/Plan   COVID-19; acute hypoxic respiratory failure  - Moderate symptoms - CXR mild bibasilar opacifications concerning for PNA - Mental status changes/GI symptoms concurrent/typical for Covid - treat clinically [facility] @ admission - weaned to RA at this point - follow ambulation study -Remdesivir/dexamethasone 2/15-->19/24th  Recent Labs    07/25/19 0410  DDIMER 0.49  FERRITIN 430*  CRP 5.6*   Acute metabolic encephalopathy, POA, likely 2/2 above  - Resolved with treatment as above, continue to follow - High risk for sundowning/delirium  Acute kidney injury, improved - SCr is 1.34 in ED, up from an apparent baseline of 0.9 - now back to baseline - Likely prerenal azotemia in the setting of poor po intake, hypovolemia, low BP, ACEi  - s/p fluids in ED - increase PO intake as tolerated  CAD  - No anginal complaints  - Continue Lipitor    Anxiety  - Continue low-dose Xanax   COPD  - No wheezing, no sputum production  - Continue bronchodilators    Hyponatremia  - Serum sodium 131 in ED in setting of hypovolemia  - He was fluid-resuscitated in ED, will hold IVF for now and repeat chem panel in am    Hypotension, resolving - BP  was 84/43 in ED, normalized with fluids - Continue to increase PO intake, restart home meds when indicated.  DVT prophylaxis: Lovenox Code Status: DNR  Family Communication: Wife updated Disposition Plan: Patient coming from home and will likely return there once respiratory status improved and patient stable on room air in 72-96h pending clinical  improvement and possible need for therapy/oxygen supplementation evaluation. Consults called: None Admission status: Inpatient     Physical Exam: Vitals:   07/24/19 2322 07/25/19 0344 07/25/19 0525 07/25/19 0700  BP:  130/74 (!) 126/93 (!) 145/81  Pulse:  (!) 58 69   Resp:  18 17   Temp:  (!) 97.5 F (36.4 C) 98 F (36.7 C) 98.2 F (36.8 C)  TempSrc:  Oral Oral Oral  SpO2:  98% 100%   Weight: 72.6 kg     Height: 5\' 8"  (1.727 m)       Constitutional: NAD, calm  Eyes: PERTLA, lids and conjunctivae normal ENMT: Mucous membranes are moist. Posterior pharynx clear of any exudate or lesions.   Neck: normal, supple, no masses, no thyromegaly Respiratory: Speaking full sentences, no wheezing, no crackles. No accessory muscle use.  Cardiovascular: S1 & S2 heard, regular rate and rhythm. No extremity edema.   Abdomen: No distension, no tenderness, soft. Bowel sounds active.  Musculoskeletal: no clubbing / cyanosis. No joint deformity upper and lower extremities.   Skin: no significant rashes, lesions, ulcers. Poor turgor. Neurologic: No facial asymmetry. Sensation intact Moving all extremities.  Psychiatric: Alert and oriented to person, place, time, and situation. Pleasant and cooperative.    Labs and Imaging on Admission: I have personally reviewed following labs and imaging studies  CBC: Recent Labs  Lab 07/24/19 1122 07/25/19 0410  WBC 8.4 6.7  NEUTROABS 6.2 5.2  HGB 13.4 13.9  HCT 40.3 41.0  MCV 97.1 96.9  PLT 163 [id number]*   Basic Metabolic Panel: Recent Labs  Lab 07/24/19 1122 07/25/19 0410  NA 131* 138  K 3.9 4.6  CL 97* 101  CO2 25 26  GLUCOSE 116* 134*  BUN 30* 28*  CREATININE 1.34* 0.93  CALCIUM 8.4* 8.6*  MG  --  1.8   GFR: Estimated Creatinine Clearance: 68.4 mL/min (by C-G formula based on SCr of 0.93 mg/dL). Liver Function Tests: Recent Labs  Lab 07/24/19 1122 07/25/19 0410  AST 23 33  ALT 18 30  ALKPHOS 54 55  BILITOT 0.6 0.3  PROT 6.4* 6.0*    ALBUMIN 3.5 3.3*   Recent Labs  Lab 07/24/19 1122  LIPASE 26   No results for input(s): AMMONIA in the last 168 hours. Coagulation Profile: Recent Labs  Lab 07/24/19 1122  INR 1.1   Cardiac Enzymes: No results for input(s): CKTOTAL, CKMB, CKMBINDEX, TROPONINI in the last 168 hours. BNP (last 3 results) No results for input(s): PROBNP in the last 8760 hours. HbA1C: Recent Labs    07/25/19 0410  HGBA1C 6.5*   CBG: Recent Labs  Lab 07/24/19 2236  GLUCAP 191*   Lipid Profile: No results for input(s): CHOL, HDL, LDLCALC, TRIG, CHOLHDL, LDLDIRECT in the last 72 hours. Thyroid Function Tests: No results for input(s): TSH, T4TOTAL, FREET4, T3FREE, THYROIDAB in the last 72 hours. Anemia Panel: Recent Labs    07/25/19 0410  FERRITIN 430*   Urine analysis:    Component Value Date/Time   COLORURINE YELLOW (A) 07/24/2019 1508   APPEARANCEUR CLEAR (A) 07/24/2019 1508   LABSPEC 1.006 07/24/2019 1508   PHURINE 6.0 07/24/2019 1508   GLUCOSEU  NEGATIVE 07/24/2019 [place] 07/24/2019 [place] 07/24/2019 [place] 07/24/2019 1508   PROTEINUR NEGATIVE 07/24/2019 1508   NITRITE NEGATIVE 07/24/2019 1508   LEUKOCYTESUR NEGATIVE 07/24/2019 1508    Recent Results (from the past 240 hour(s))  Novel Coronavirus, NAA ([facility])     Status: None   Collection Time: 07/20/19  2:19 PM   Specimen: Nasopharyngeal(NP) swabs in vial transport medium   NASOPHARYNGE  TESTING  Result Value Ref Range Status   SARS-CoV-2, NAA Not Detected Not Detected Final    Comment: This nucleic acid amplification test was developed and its performance characteristics determined by [employer]. Nucleic acid amplification tests include RT-PCR and TMA. This test has not been FDA cleared or approved. This test has been authorized by FDA under an Emergency Use Authorization (EUA). This test is only authorized for the duration of time the declaration  that circumstances exist justifying the authorization of the emergency use of in vitro diagnostic tests for detection of SARS-CoV-2 virus and/or diagnosis of COVID-19 infection under section 564(b)(1) of the Act, 21 U.S.C. [id number]) (1), unless the authorization is terminated or revoked sooner. When diagnostic testing is negative, the possibility of a false negative result should be considered in the context of a patient's recent exposures and the presence of clinical signs and symptoms consistent with COVID-19. An individual without symptoms of COVID-19 and who is not shedding SARS-CoV-2 virus wo uld expect to have a negative (not detected) result in this assay.   Respiratory Panel by RT PCR (Flu A&B, Covid) - Nasopharyngeal Swab     Status: Abnormal   Collection Time: 07/24/19 12:32 PM   Specimen: Nasopharyngeal Swab  Result Value Ref Range Status   SARS Coronavirus 2 by RT PCR POSITIVE (A) NEGATIVE Final    Comment: RESULT CALLED TO, READ BACK BY AND VERIFIED WITH: BILL SMITH AT [id number] ON 07/24/2019 [place]. (NOTE) SARS-CoV-2 target nucleic acids are DETECTED. SARS-CoV-2 RNA is generally detectable in upper respiratory specimens  during the acute phase of infection. Positive results are indicative of the presence of the identified virus, but do not rule out bacterial infection or co-infection with other pathogens not detected by the test. Clinical correlation with patient history and other diagnostic information is necessary to determine patient infection status. The expected result is Negative. Fact Sheet for Patients:  PinkCheek.be Fact Sheet for Healthcare Providers: GravelBags.it This test is not yet approved or cleared by the [place] FDA and  has been authorized for detection and/or diagnosis of SARS-CoV-2 by FDA under an Emergency Use Authorization (EUA).  This EUA will remain in effect (meaning this test can be  used)  for the duration of  the COVID-19 declaration under Section 564(b)(1) of the Act, 21 U.S.C. section 360bbb-3(b)(1), unless the authorization is terminated or revoked sooner.    Influenza A by PCR NEGATIVE NEGATIVE Final   Influenza B by PCR NEGATIVE NEGATIVE Final    Comment: (NOTE) The Xpert Xpress SARS-CoV-2/FLU/RSV assay is intended as an aid in  the diagnosis of influenza from Nasopharyngeal swab specimens and  should not be used as a sole basis for treatment. Nasal washings and  aspirates are unacceptable for Xpert Xpress SARS-CoV-2/FLU/RSV  testing. Fact Sheet for Patients: PinkCheek.be Fact Sheet for Healthcare Providers: GravelBags.it This test is not yet approved or cleared by the [place] FDA and  has been authorized for detection and/or diagnosis of SARS-CoV-2 by  FDA under an Emergency Use Authorization (  EUA). This EUA will remain  in effect (meaning this test can be used) for the duration of the  Covid-19 declaration under Section 564(b)(1) of the Act, 21  U.S.C. section 360bbb-3(b)(1), unless the authorization is  terminated or revoked. Performed at [facility], [address] [place], [place] [address]      Radiological Exams on Admission: DG Chest [facility] 1 View  Result Date: 07/24/2019 CLINICAL DATA:  Altered mental status. Fever. COVID-19 positive on 07/20/2019. EXAM: PORTABLE CHEST 1 VIEW COMPARISON:  Chest x-ray dated 06/16/2019 FINDINGS: There is a small infiltrate at the left lung base posterior medially. There is slight haziness peripherally in the left mid and lower lung zone. There is also slight haziness at the right lung base with a small focal infiltrate medially. The heart size and pulmonary vascularity are normal. Bones are normal. IMPRESSION: Faint small bilateral pulmonary infiltrates, left greater than right. Electronically Signed   By: Lorriane Shire M.D.   On: 07/24/2019  11:50    EKG: Independently reviewed. Sinus rhythm with PAC's.    Little Ishikawa, DO [facility] Pager: See [email or web address]  If 7AM-7PM, please contact the daytime attending [email or web address]  07/25/2019, 8:30 AM

## 2019-07-25 NOTE — Progress Notes (Signed)
Inserted LIBRE into left lateral upper arm per protocol.

## 2019-07-25 NOTE — Progress Notes (Signed)
Physical Therapy Evaluation Patient Details Name: Karl Myers MRN: [id number] DOB: 05-17-1946 Today's Date: 07/25/2019   History of Present Illness  74 y.o. male with medical history significant for coronary artery disease, COPD, chronic low back pain, hypertension, and anxiety, now presenting to the emergency department for evaluation of confusion, fevers, cough, and shortness of breath.  Clinical Impression  Pt demonstrates good functional mobility today, being able to self manage lines in room, and ambulate >183ft with no SOB, rest breaks, SpO2 >95% on RA with RW. Pt demonstrates no near LOB and also demonstrate independent mobility 25 ft in room with no near LOB observed. He demonstrates mild deficits in gait and steadiness currently, but is near baseline. PT will continue to follow up if patient is in hospital to prevent decompensation and muscular weakness. Pt appears safe to ambulate with RN or other staff as much as requested and appears safe to ambulate room without supervision if he can manage cords on his own at this time.     Follow Up Recommendations No PT follow up    Equipment Recommendations  None recommended by PT    Recommendations for Other Services       Precautions / Restrictions Precautions Precautions: None Restrictions Weight Bearing Restrictions: No      Mobility  Bed Mobility Overal bed mobility: Modified Independent                Transfers Overall transfer level: Modified independent Equipment used: Rolling walker (2 wheeled) Transfers: Sit to/from Stand Sit to Stand: Modified independent (Device/Increase time)            Ambulation/Gait Ambulation/Gait assistance: Modified independent (Device/Increase time) Gait Distance (Feet): 175 Feet Assistive device: Rolling walker (2 wheeled);None Gait Pattern/deviations: Step-through pattern;Wide base of support   Gait velocity interpretation: >2.62 ft/sec, indicative of community  ambulatory    Stairs            Wheelchair Mobility    Modified Rankin (Stroke Patients Only)       Balance Overall balance assessment: Mild deficits observed, not formally tested               Pertinent Vitals/Pain Pain Assessment: No/denies pain    Home Living Family/patient expects to be discharged to:: Private residence Living Arrangements: Spouse/significant other Available Help at Discharge: Family Type of Home: House Home Access: Stairs to enter Entrance Stairs-Rails: Left Entrance Stairs-Number of Steps: 1 Home Layout: One level Home Equipment: Environmental consultant - 2 wheels;Shower seat - built in      Prior Function Level of Independence: Independent               Hand Dominance   Dominant Hand: Right    Extremity/Trunk Assessment   Upper Extremity Assessment Upper Extremity Assessment: Defer to OT evaluation    Lower Extremity Assessment Lower Extremity Assessment: Generalized weakness       Communication   Communication: No difficulties  Cognition Arousal/Alertness: Awake/alert Behavior During Therapy: WFL for tasks assessed/performed Overall Cognitive Status: Within Functional Limits for tasks assessed                        Exercises Other Exercises Other Exercises: diaphragmatic breathing x 10 Other Exercises: pursed lip breathing Other Exercises: relaxation exercises   Assessment/Plan    PT Assessment Patient needs continued PT services  PT Problem List Decreased balance;Decreased knowledge of use of DME;Decreased knowledge of precautions;Cardiopulmonary status limiting activity  PT Treatment Interventions DME instruction;Functional mobility training;Stair training;Gait training;Therapeutic activities;Therapeutic exercise;Balance training;Neuromuscular re-education;Cognitive remediation;Patient/family education    PT Goals (Current goals can be found in the Care Plan section)  Acute Rehab PT Goals Patient Stated  Goal: Go home to wife PT Goal Formulation: With patient Time For Goal Achievement: 08/08/19 Potential to Achieve Goals: Good    Frequency Min 3X/week    AM-PAC PT "6 Clicks" Mobility  Outcome Measure Help needed turning from your back to your side while in a flat bed without using bedrails?: None Help needed moving from lying on your back to sitting on the side of a flat bed without using bedrails?: None Help needed moving to and from a bed to a chair (including a wheelchair)?: None Help needed standing up from a chair using your arms (e.g., wheelchair or bedside chair)?: None Help needed to walk in hospital room?: None Help needed climbing 3-5 steps with a railing? : A Little 6 Click Score: 23    End of Session Equipment Utilized During Treatment: Other (comment)(RW) Activity Tolerance: Patient tolerated treatment well Patient left: with call bell/phone within reach;in bed Nurse Communication: Mobility status PT Visit Diagnosis: Unsteadiness on feet (R26.81);Muscle weakness (generalized) (M62.81)    Time: [id number] PT Time Calculation (min) (ACUTE ONLY): 15 min   Charges:   PT Evaluation $PT Eval Low Complexity: 1 Low          Ann Held PT, DPT Acute Rehab [facility] P: [phone]   Miakoda Mcmillion A Jameer Storie 07/25/2019, 6:11 PM

## 2019-07-26 DIAGNOSIS — J9601 Acute respiratory failure with hypoxia: Secondary | ICD-10-CM

## 2019-07-26 DIAGNOSIS — F419 Anxiety disorder, unspecified: Secondary | ICD-10-CM

## 2019-07-26 LAB — CBC WITH DIFFERENTIAL/PLATELET
Abs Immature Granulocytes: 0.11 10*3/uL — ABNORMAL HIGH (ref 0.00–0.07)
Basophils Absolute: 0 10*3/uL (ref 0.0–0.1)
Basophils Relative: 0 %
Eosinophils Absolute: 0 10*3/uL (ref 0.0–0.5)
Eosinophils Relative: 0 %
HCT: 41 % (ref 39.0–52.0)
Hemoglobin: 14 g/dL (ref 13.0–17.0)
Immature Granulocytes: 1 %
Lymphocytes Relative: 11 %
Lymphs Abs: 1.4 10*3/uL (ref 0.7–4.0)
MCH: 32.9 pg (ref 26.0–34.0)
MCHC: 34.1 g/dL (ref 30.0–36.0)
MCV: 96.2 fL (ref 80.0–100.0)
Monocytes Absolute: 0.9 10*3/uL (ref 0.1–1.0)
Monocytes Relative: 7 %
Neutro Abs: 10.4 10*3/uL — ABNORMAL HIGH (ref 1.7–7.7)
Neutrophils Relative %: 81 %
Platelets: 163 10*3/uL (ref 150–400)
RBC: 4.26 MIL/uL (ref 4.22–5.81)
RDW: 13.9 % (ref 11.5–15.5)
WBC: 12.8 10*3/uL — ABNORMAL HIGH (ref 4.0–10.5)
nRBC: 0 % (ref 0.0–0.2)

## 2019-07-26 LAB — GLUCOSE, CAPILLARY: Glucose-Capillary: 124 mg/dL — ABNORMAL HIGH (ref 70–99)

## 2019-07-26 LAB — COMPREHENSIVE METABOLIC PANEL
ALT: 31 U/L (ref 0–44)
AST: 36 U/L (ref 15–41)
Albumin: 3.2 g/dL — ABNORMAL LOW (ref 3.5–5.0)
Alkaline Phosphatase: 51 U/L (ref 38–126)
Anion gap: 10 (ref 5–15)
BUN: 30 mg/dL — ABNORMAL HIGH (ref 8–23)
CO2: 23 mmol/L (ref 22–32)
Calcium: 8.5 mg/dL — ABNORMAL LOW (ref 8.9–10.3)
Chloride: 102 mmol/L (ref 98–111)
Creatinine, Ser: 0.76 mg/dL (ref 0.61–1.24)
GFR calc Af Amer: >60 mL/min (ref >60)
GFR calc non Af Amer: >60 mL/min (ref >60)
Glucose, Bld: 148 mg/dL — ABNORMAL HIGH (ref 70–99)
Potassium: 4.4 mmol/L (ref 3.5–5.1)
Sodium: 135 mmol/L (ref 135–145)
Total Bilirubin: 0.3 mg/dL (ref 0.3–1.2)
Total Protein: 6.1 g/dL — ABNORMAL LOW (ref 6.5–8.1)

## 2019-07-26 LAB — FERRITIN: Ferritin: 469 ng/mL — ABNORMAL HIGH (ref 24–336)

## 2019-07-26 LAB — C-REACTIVE PROTEIN: CRP: 2.7 mg/dL — ABNORMAL HIGH (ref <1.0)

## 2019-07-26 LAB — D-DIMER, QUANTITATIVE: D-Dimer, Quant: 0.43 ug/mL-FEU (ref 0.00–0.50)

## 2019-07-26 MED ORDER — MAGIC MOUTHWASH W/LIDOCAINE
10.0000 mL | Freq: Four times a day (QID) | ORAL | Status: DC | PRN
  Administered 2019-07-26: 10 mL via ORAL
  Filled 2019-07-26 (×2): qty 10

## 2019-07-26 MED ORDER — MAGNESIUM SULFATE 2 GM/50ML IV SOLN
2.0000 g | Freq: Once | INTRAVENOUS | Status: AC
  Administered 2019-07-26: 2 g via INTRAVENOUS
  Filled 2019-07-26: qty 50

## 2019-07-26 MED ORDER — ENOXAPARIN SODIUM 80 MG/0.8ML ~~LOC~~ SOLN
1.0000 mg/kg | Freq: Two times a day (BID) | SUBCUTANEOUS | Status: DC
  Administered 2019-07-26 – 2019-07-27 (×2): 75 mg via SUBCUTANEOUS
  Filled 2019-07-26 (×2): qty 0.8

## 2019-07-26 NOTE — Plan of Care (Signed)

## 2019-07-26 NOTE — Progress Notes (Signed)
Physical Therapy Treatment Patient Details Name: Karl Myers MRN: [id number] DOB: Feb 10, 1946 Today's Date: 07/26/2019    History of Present Illness 74 y.o. male with medical history significant for coronary artery disease, COPD, chronic low back pain, hypertension, and anxiety, now presenting to the emergency department for evaluation of confusion, fevers, cough, and shortness of breath.    PT Comments    Pt demonstrates reduced motivation to participate with PT today. Per RN pt continues to demonstrate good functional mobility, is on RA, with HR varying wildly during observation sitting EOB. Pt HR was generally around 117 due to Afib and chemical imbalance. Pt started crying, stating he "wanted to die at home, and not here". He was agreeable to performing deep breathing and relaxation exercises, and reemphasized importance of working on deep breathing. Pt enjoys speaking about gardening and bee keeping as distraction exercises. Plan to continue following to assure no decompensation in balance or functional mobility when acute.    Follow Up Recommendations  No PT follow up     Equipment Recommendations  None recommended by PT    Recommendations for Other Services       Precautions / Restrictions Precautions Precautions: None Restrictions Weight Bearing Restrictions: No    Mobility  Bed Mobility Overal bed mobility: Modified Independent                Transfers Overall transfer level: Modified independent Equipment used: Rolling walker (2 wheeled) Transfers: Sit to/from Stand Sit to Stand: Modified independent (Device/Increase time)            Ambulation/Gait             General Gait Details: Did not want to ambulate today   Stairs             Wheelchair Mobility    Modified Rankin (Stroke Patients Only)       Balance Overall balance assessment: Mild deficits observed, not formally tested                                           Cognition Arousal/Alertness: Awake/alert Behavior During Therapy: WFL for tasks assessed/performed Overall Cognitive Status: Within Functional Limits for tasks assessed                                        Exercises Other Exercises Other Exercises: diaphragmatic breathing x 10 Other Exercises: deep breathing x 5 (4,4,6s timings per anxiety handout)    General Comments General comments (skin integrity, edema, etc.): Patient became sad/anxious towards end of inquiring if he had questions about prior education. Pt reports "I don't want to die here, I want to die at home".       Pertinent Vitals/Pain Pain Assessment: No/denies pain    Home Living                      Prior Function            PT Goals (current goals can now be found in the care plan section) Acute Rehab PT Goals Patient Stated Goal: Go home to wife PT Goal Formulation: With patient Time For Goal Achievement: 08/08/19 Potential to Achieve Goals: Good Progress towards PT goals: Progressing toward goals(Pt did not perform mobility with PT, but ambulated multiple)  Frequency    Min 3X/week      PT Plan Current plan remains appropriate    Co-evaluation              AM-PAC PT "6 Clicks" Mobility   Outcome Measure  Help needed turning from your back to your side while in a flat bed without using bedrails?: None Help needed moving from lying on your back to sitting on the side of a flat bed without using bedrails?: None Help needed moving to and from a bed to a chair (including a wheelchair)?: None Help needed standing up from a chair using your arms (e.g., wheelchair or bedside chair)?: None Help needed to walk in hospital room?: None Help needed climbing 3-5 steps with a railing? : A Little 6 Click Score: 23    End of Session   Activity Tolerance: Treatment limited secondary to agitation;Other (comment)(Treatment limited by depressive  syndrome) Patient left: with call bell/phone within reach;in bed Nurse Communication: Mobility status PT Visit Diagnosis: Unsteadiness on feet (R26.81);Muscle weakness (generalized) (M62.81)     Time: [id number] PT Time Calculation (min) (ACUTE ONLY): 18 min  Charges:  $Therapeutic Activity: 8-22 mins                     Ann Held PT, DPT [place] P: [place] 07/26/2019, 3:52 PM

## 2019-07-26 NOTE — Progress Notes (Signed)
ANTICOAGULATION CONSULT NOTE - Initial Consult  Pharmacy Consult for Lovenox Indication: atrial fibrillation  Allergies  Allergen Reactions  . Hydrocodone-Acetaminophen Other (See Comments)    With large quantities "hyper"  . Neurontin [Gabapentin] Other (See Comments)    With large quantities "hyper"     Patient Measurements: Height: 5\' 8"  (172.7 cm) Weight: 160 lb (72.6 kg) IBW/kg (Calculated) : 68.4 Heparin Dosing Weight: actual weight  Vital Signs: Temp: 97.9 F (36.6 C) (02/17 1200) Temp Source: Oral (02/17 1200) BP: 123/69 (02/17 1200) Pulse Rate: 48 (02/17 0319)  Labs: Recent Labs    07/24/19 1122 07/24/19 1122 07/25/19 0410 07/26/19 0330  HGB 13.4   < > 13.9 14.0  HCT 40.3  --  41.0 41.0  PLT 163  --  148* 163  APTT 35  --   --   --   LABPROT 13.7  --   --   --   INR 1.1  --   --   --   CREATININE 1.34*  --  0.93 0.76   < > = values in this interval not displayed.    Estimated Creatinine Clearance: 79.6 mL/min (by C-G formula based on SCr of 0.76 mg/dL).   Medical History: Past Medical History:  Diagnosis Date  . Arthritis   . COPD (chronic obstructive pulmonary disease) ([place])   . DDD (degenerative disc disease), cervical   . DDD (degenerative disc disease), cervical   . Depression   . Hypertension   . Pre-diabetes     Medications:  Scheduled:  . ALPRAZolam  0.5 mg Oral QHS  . atorvastatin  40 mg Oral q1800  . dexamethasone (DECADRON) injection  6 mg Intravenous Q24H  . enoxaparin (LOVENOX) injection  1 mg/kg Subcutaneous BID  . insulin aspart  0-9 Units Subcutaneous TID WC  . sodium chloride flush  3 mL Intravenous Q12H    Assessment: 74 y/o M admitted with COVID-19 PNA and new-onset atrial fibrillation.   This patients CHA2DS2-VASc Score and unadjusted Ischemic Stroke Rate (% per year) is equal to 4.8 % stroke rate/year from a score of 4  Goal of Therapy:  Anti-Xa level 0.6-1 units/ml 4hrs after LMWH dose given Monitor platelets  by anticoagulation protocol: Yes   Plan:  -Lovenox 75 mg (1 mg/kg) bid  -Monitor renal function, CBC, s/s of bleeding -F/U plans for oral anticoagulation  Ulice Dash D 07/26/2019,2:16 PM

## 2019-07-26 NOTE — Progress Notes (Addendum)
PROGRESS NOTE  Karl Myers  [id number] DOB: [birth year] DOA: 07/24/2019 PCP: Leone Haven, MD   Brief Narrative: Karl Myers is a 74 y.o. male with a history of COPD, tobacco use, CAD, HTN, HLD, stage II CKD who developed cough, fever on 2/11 after attending a funeral on 2/6 for his sister who died of covid-19, presented to [facility] ED 2/15 with confusion, cough, and dyspnea. In the ED he was hypotensive but responsive to 2.5L LR. SARS-CoV-2 testing positive with bilateral multifocal infiltrates and hypoxia requiring 2L O2. Mentation improved and he was admitted to [facility] on remdesivir and steroids.  Assessment & Plan: Principal Problem:   Acute respiratory disease due to COVID-19 virus Active Problems:   Hypertension   CAD (coronary artery disease)   COPD (chronic obstructive pulmonary disease) (HCC)   Anxiety   Acute metabolic encephalopathy   Acute renal failure superimposed on stage 2 chronic kidney disease (HCC)   Hypotension   Hyponatremia   Acute hypoxemic respiratory failure due to COVID-19 [facility])  Acute hypoxemic respiratory failure due to covid-19 pneumonia: SARS-CoV-2 PCR positive on 2/15. Symptom onset 2/11, though he tested negative at that time, he developed similar symptoms at a similar time with suspected same exposure as his covid-positive wife.  - Wean oxygen as tolerated. Ambulatory pulse oximetry prior to discharge.  - Continue remdesivir x5 days 2/15 - 2/19 - Steroids x10 days. CRP 5.6 > 2.7. PCT 0.11 not consistent with bacterial superinfection. Leukocytosis likely due to steroids. - Vitamin C, zinc - Encourage OOB, IS, FV, and awake proning if able - Tylenol and antitussives prn - Continue airborne, contact precautions while admitted. Isolation period would be recommended for 21 days from positive testing. - Enoxaparin prophylactic dose.  - Maintain euvolemia/net negative.  - Avoid NSAIDs   AKI on stage II CKD: Improved with hydration.  - Avoid  nephrotoxins - Monitor CrCl  Acute metabolic encephalopathy: Resolved. - Delirium precautions.   T2DM: Technically diagnostic HbA1c of 6.5%, though unclear what impact acute illness has had.  - Continue sensitive SSI - PCP follow up recommended.  COPD, tobacco use (>50 years):  - Continue steroids as above, bonchodilators, and nicotine patch as needed.   CAD: No anginal complaints. ECG without ischemic changes. Had los risk stress testing Feb 2019. - Continue statin, prn NTG.   HTN:  - Holding BP meds due to initial hypotension.   HLD:  - Continue statin. LFTs wnl.   Anxiety: Quiescent.  - Continue qHS med  Sinus bradycardia, PACs, new diagnosis atrial fibrillation: Asymptomatic. Known onset of AFib 2/17 ~10am.  - Discussed ECG 2/17 with Dr. Julianne Handler with cardiology who agrees with diagnosis of atrial fibrillation. - Due to CHA2DS2-VASc score of 3 (HTN, CAD, age (9)) or 4  with possible T2DM, will initiate therapeutic dose anticoagulation. - Maintain K > 4 and Mg > 2. Supplement Mg now. - Check TSH in AM - Patient of Dr. Donivan Scull in [place] with whom he will follow up. - Had been holding atenolol due to initial hypotension and bradycardic rates. Will have low threshold to restart if rate becomes elevated.  DVT prophylaxis: Lovenox Code Status: DNR confirmed, POA Family Communication: It is the patient's 53rd wedding anniversary today. Wife is also sick, but got Ab and is improved. Discussed with her at length.  Disposition Plan: DC tmrw pending weather conditions if he has reliable transportation to return for remdesivir infusions as an outpatient.   Consultants:   Cardiology by phone 2/17  Procedures:   None  Antimicrobials:  Remdesivir   Subjective: Feels better, still short of breath with exertion which is not usual. No chest pain or palpitations. No lightheadedness, dizziness, abd pain, N/V.   Objective: Vitals:   07/25/19 2010 07/26/19 0319 07/26/19  0800 07/26/19 0900  BP: 132/79 140/86 (!) 145/88   Pulse: 62 (!) 48    Resp: 18 19    Temp: 97.9 F (36.6 C) 97.6 F (36.4 C) (!) 97.5 F (36.4 C) 97.8 F (36.6 C)  TempSrc: Oral Oral Oral Oral  SpO2: 100% 94%    Weight:      Height:        Intake/Output Summary (Last 24 hours) at 07/26/2019 0953 Last data filed at 07/26/2019 [id number] Gross per 24 hour  Intake 1160 ml  Output 600 ml  Net 560 ml   Filed Weights   07/24/19 2322  Weight: 72.6 kg    Gen: 74 y.o. male in no distress Pulm: Non-labored breathing at rest. Bibasilar crackles noted without wheezing, diminished diffusely.  CV: Regular with premature beats. No murmur, rub, or gallop. No JVD, no pitting pedal edema. GI: Abdomen soft, non-tender, non-distended, with normoactive bowel sounds. No organomegaly or masses felt. Ext: Warm, no deformities Skin: No rashes, lesions or ulcers Neuro: Alert and oriented. No focal neurological deficits. Psych: Judgement and insight appear normal. Mood & affect appropriate.   Data Reviewed: I have personally reviewed following labs and imaging studies  CBC: Recent Labs  Lab 07/24/19 1122 07/25/19 0410 07/26/19 0330  WBC 8.4 6.7 12.8*  NEUTROABS 6.2 5.2 10.4*  HGB 13.4 13.9 14.0  HCT 40.3 41.0 41.0  MCV 97.1 96.9 96.2  PLT 163 148* [id number]   Basic Metabolic Panel: Recent Labs  Lab 07/24/19 1122 07/25/19 0410 07/26/19 0330  NA 131* 138 135  K 3.9 4.6 4.4  CL 97* 101 102  CO2 25 26 23   GLUCOSE 116* 134* 148*  BUN 30* 28* 30*  CREATININE 1.34* 0.93 0.76  CALCIUM 8.4* 8.6* 8.5*  MG  --  1.8  --    GFR: Estimated Creatinine Clearance: 79.6 mL/min (by C-G formula based on SCr of 0.76 mg/dL). Liver Function Tests: Recent Labs  Lab 07/24/19 1122 07/25/19 0410 07/26/19 0330  AST 23 33 36  ALT 18 30 31   ALKPHOS 54 55 51  BILITOT 0.6 0.3 0.3  PROT 6.4* 6.0* 6.1*  ALBUMIN 3.5 3.3* 3.2*   Recent Labs  Lab 07/24/19 1122  LIPASE 26   No results for input(s): AMMONIA  in the last 168 hours. Coagulation Profile: Recent Labs  Lab 07/24/19 1122  INR 1.1   Cardiac Enzymes: No results for input(s): CKTOTAL, CKMB, CKMBINDEX, TROPONINI in the last 168 hours. BNP (last 3 results) No results for input(s): PROBNP in the last 8760 hours. HbA1C: Recent Labs    07/25/19 0410  HGBA1C 6.5*   CBG: Recent Labs  Lab 07/24/19 2236  GLUCAP 191*   Lipid Profile: No results for input(s): CHOL, HDL, LDLCALC, TRIG, CHOLHDL, LDLDIRECT in the last 72 hours. Thyroid Function Tests: No results for input(s): TSH, T4TOTAL, FREET4, T3FREE, THYROIDAB in the last 72 hours. Anemia Panel: Recent Labs    07/25/19 0410 07/26/19 0330  FERRITIN 430* 469*   Urine analysis:    Component Value Date/Time   COLORURINE YELLOW (A) 07/24/2019 1508   APPEARANCEUR CLEAR (A) 07/24/2019 1508   LABSPEC 1.006 07/24/2019 1508   PHURINE 6.0 07/24/2019 1508   GLUCOSEU NEGATIVE 07/24/2019 1508  HGBUR NEGATIVE 07/24/2019 1508   BILIRUBINUR NEGATIVE 07/24/2019 [place] 07/24/2019 1508   PROTEINUR NEGATIVE 07/24/2019 1508   NITRITE NEGATIVE 07/24/2019 1508   LEUKOCYTESUR NEGATIVE 07/24/2019 1508   Recent Results (from the past 240 hour(s))  Novel Coronavirus, NAA ([facility])     Status: None   Collection Time: 07/20/19  2:19 PM   Specimen: Nasopharyngeal(NP) swabs in vial transport medium   NASOPHARYNGE  TESTING  Result Value Ref Range Status   SARS-CoV-2, NAA Not Detected Not Detected Final    Comment: This nucleic acid amplification test was developed and its performance characteristics determined by [employer]. Nucleic acid amplification tests include RT-PCR and TMA. This test has not been FDA cleared or approved. This test has been authorized by FDA under an Emergency Use Authorization (EUA). This test is only authorized for the duration of time the declaration that circumstances exist justifying the authorization of the emergency use of in  vitro diagnostic tests for detection of SARS-CoV-2 virus and/or diagnosis of COVID-19 infection under section 564(b)(1) of the Act, 21 U.S.C. [id number]) (1), unless the authorization is terminated or revoked sooner. When diagnostic testing is negative, the possibility of a false negative result should be considered in the context of a patient's recent exposures and the presence of clinical signs and symptoms consistent with COVID-19. An individual without symptoms of COVID-19 and who is not shedding SARS-CoV-2 virus wo uld expect to have a negative (not detected) result in this assay.   Blood Culture (routine x 2)     Status: None (Preliminary result)   Collection Time: 07/24/19 11:22 AM   Specimen: BLOOD  Result Value Ref Range Status   Specimen Description BLOOD BLOOD RIGHT FOREARM  Final   Special Requests   Final    BOTTLES DRAWN AEROBIC AND ANAEROBIC Blood Culture adequate volume   Culture   Final    NO GROWTH 2 DAYS Performed at [facility], [address], [place], [place] [address]    Report Status PENDING  Incomplete  Blood Culture (routine x 2)     Status: None (Preliminary result)   Collection Time: 07/24/19 12:17 PM   Specimen: BLOOD  Result Value Ref Range Status   Specimen Description BLOOD BLOOD RIGHT WRIST  Final   Special Requests   Final    BOTTLES DRAWN AEROBIC AND ANAEROBIC Blood Culture results may not be optimal due to an inadequate volume of blood received in culture bottles   Culture   Final    NO GROWTH 2 DAYS Performed at [facility], [address], [place], [place] [address]    Report Status PENDING  Incomplete  Respiratory Panel by RT PCR (Flu A&B, Covid) - Nasopharyngeal Swab     Status: Abnormal   Collection Time: 07/24/19 12:32 PM   Specimen: Nasopharyngeal Swab  Result Value Ref Range Status   SARS Coronavirus 2 by RT PCR POSITIVE (A) NEGATIVE Final    Comment: RESULT CALLED TO, READ BACK BY AND VERIFIED WITH: BILL  SMITH AT [id number] ON 07/24/2019 [place]. (NOTE) SARS-CoV-2 target nucleic acids are DETECTED. SARS-CoV-2 RNA is generally detectable in upper respiratory specimens  during the acute phase of infection. Positive results are indicative of the presence of the identified virus, but do not rule out bacterial infection or co-infection with other pathogens not detected by the test. Clinical correlation with patient history and other diagnostic information is necessary to determine patient infection status. The expected result is Negative. Fact Sheet  for Patients:  PinkCheek.be Fact Sheet for Healthcare Providers: GravelBags.it This test is not yet approved or cleared by the [place] FDA and  has been authorized for detection and/or diagnosis of SARS-CoV-2 by FDA under an Emergency Use Authorization (EUA).  This EUA will remain in effect (meaning this test can be used)  for the duration of  the COVID-19 declaration under Section 564(b)(1) of the Act, 21 U.S.C. section 360bbb-3(b)(1), unless the authorization is terminated or revoked sooner.    Influenza A by PCR NEGATIVE NEGATIVE Final   Influenza B by PCR NEGATIVE NEGATIVE Final    Comment: (NOTE) The Xpert Xpress SARS-CoV-2/FLU/RSV assay is intended as an aid in  the diagnosis of influenza from Nasopharyngeal swab specimens and  should not be used as a sole basis for treatment. Nasal washings and  aspirates are unacceptable for Xpert Xpress SARS-CoV-2/FLU/RSV  testing. Fact Sheet for Patients: PinkCheek.be Fact Sheet for Healthcare Providers: GravelBags.it This test is not yet approved or cleared by the [place] FDA and  has been authorized for detection and/or diagnosis of SARS-CoV-2 by  FDA under an Emergency Use Authorization (EUA). This EUA will remain  in effect (meaning this test can be used) for the duration of the   Covid-19 declaration under Section 564(b)(1) of the Act, 21  U.S.C. section 360bbb-3(b)(1), unless the authorization is  terminated or revoked. Performed at [facility], [address], [place], [place] [address]   Urine culture     Status: None   Collection Time: 07/24/19  3:08 PM   Specimen: In/Out Cath Urine  Result Value Ref Range Status   Specimen Description   Final    IN/OUT CATH URINE Performed at [facility], [address] [place], [place] [address]    Special Requests   Final    NONE Performed at [facility], [address] [place], [place] [address]    Culture   Final    NO GROWTH Performed at [facility], [place] [address] [place], [place] [address]    Report Status 07/25/2019 FINAL  Final      Radiology Studies: DG Chest Port 1 View  Result Date: 07/24/2019 CLINICAL DATA:  Altered mental status. Fever. COVID-19 positive on 07/20/2019. EXAM: PORTABLE CHEST 1 VIEW COMPARISON:  Chest x-ray dated 06/16/2019 FINDINGS: There is a small infiltrate at the left lung base posterior medially. There is slight haziness peripherally in the left mid and lower lung zone. There is also slight haziness at the right lung base with a small focal infiltrate medially. The heart size and pulmonary vascularity are normal. Bones are normal. IMPRESSION: Faint small bilateral pulmonary infiltrates, left greater than right. Electronically Signed   By: Lorriane Shire M.D.   On: 07/24/2019 11:50    Scheduled Meds: . ALPRAZolam  0.5 mg Oral QHS  . atorvastatin  40 mg Oral q1800  . dexamethasone (DECADRON) injection  6 mg Intravenous Q24H  . enoxaparin (LOVENOX) injection  40 mg Subcutaneous Q24H  . insulin aspart  0-9 Units Subcutaneous TID WC  . sodium chloride flush  3 mL Intravenous Q12H   Continuous Infusions: . sodium chloride    . remdesivir 100 mg in NS 100 mL 100 mg (07/26/19 0929)     LOS: 2 days   Time spent: 35 minutes.  Patrecia Pour, MD [facility] [email or web address] 07/26/2019, 9:53 AM

## 2019-07-27 ENCOUNTER — Telehealth: Payer: Self-pay | Admitting: Family

## 2019-07-27 LAB — C-REACTIVE PROTEIN: CRP: 1.3 mg/dL — ABNORMAL HIGH (ref <1.0)

## 2019-07-27 LAB — COMPREHENSIVE METABOLIC PANEL
ALT: 29 U/L (ref 0–44)
AST: 29 U/L (ref 15–41)
Albumin: 3.1 g/dL — ABNORMAL LOW (ref 3.5–5.0)
Alkaline Phosphatase: 54 U/L (ref 38–126)
Anion gap: 9 (ref 5–15)
BUN: 33 mg/dL — ABNORMAL HIGH (ref 8–23)
CO2: 30 mmol/L (ref 22–32)
Calcium: 8.6 mg/dL — ABNORMAL LOW (ref 8.9–10.3)
Chloride: 100 mmol/L (ref 98–111)
Creatinine, Ser: 0.97 mg/dL (ref 0.61–1.24)
GFR calc Af Amer: >60 mL/min (ref >60)
GFR calc non Af Amer: >60 mL/min (ref >60)
Glucose, Bld: 143 mg/dL — ABNORMAL HIGH (ref 70–99)
Potassium: 4.4 mmol/L (ref 3.5–5.1)
Sodium: 139 mmol/L (ref 135–145)
Total Bilirubin: 0.6 mg/dL (ref 0.3–1.2)
Total Protein: 5.9 g/dL — ABNORMAL LOW (ref 6.5–8.1)

## 2019-07-27 LAB — TSH: TSH: 0.043 u[IU]/mL — ABNORMAL LOW (ref 0.350–4.500)

## 2019-07-27 LAB — MAGNESIUM: Magnesium: 1.9 mg/dL (ref 1.7–2.4)

## 2019-07-27 LAB — T4, FREE: Free T4: 1.35 ng/dL — ABNORMAL HIGH (ref 0.61–1.12)

## 2019-07-27 MED ORDER — DEXAMETHASONE 6 MG PO TABS: 6.0000 mg | ORAL_TABLET | Freq: Every day | ORAL | 0 refills | Status: AC

## 2019-07-27 MED ORDER — APIXABAN 5 MG PO TABS: 5.0000 mg | ORAL_TABLET | Freq: Two times a day (BID) | ORAL | 0 refills | Status: DC

## 2019-07-27 NOTE — Telephone Encounter (Signed)

## 2019-07-27 NOTE — Progress Notes (Signed)
Patient scheduled for outpatient Remdesivir infusion at 11:30 AM on Friday 2/19.   Please advise them to report to [facility] at [address].  Drive to the security guard and tell them you are here for an infusion. They will direct you to the front entrance where we will come and get you.  For questions call [phone].  Thanks

## 2019-07-27 NOTE — Progress Notes (Signed)
Pt A&Ox4 OOB independently, continent of B&B, denies pain or discomfort, strong non-productive cough medicated with Tussinex as ordered with relief, day 4/5 Remdesivir, will get last dose @ infusion center 07/28/19@ 1130. D/C instructions given with understanding, pharmacist did counseling over the phone, pt starting Eliquis for irregular heart rhythm, prescriptions sent to pharmacy of choice, PIVs removed from mid and lower right forearm, bandages applied no bleeding noted. All pt belongings accounted for and bagged to take home.

## 2019-07-27 NOTE — Discharge Instructions (Addendum)
You are scheduled for an outpatient infusion of Remdesivir at 11:30 AM on Friday 2/19. Please report to [facility] at [address].  Drive to the security guard and tell them you are here for an infusion. They will direct you to the front entrance where we will come and get you.  For questions call [phone].  Thanks   Information on my medicine - ELIQUIS (apixaban)  This medication education was reviewed with me or my healthcare representative as part of my discharge preparation.  The pharmacist that spoke with me during my hospital stay was:  Napoleon Form [facility]  Why was Eliquis prescribed for you? Eliquis was prescribed for you to reduce the risk of forming blood clots that can cause a stroke if you have a medical condition called atrial fibrillation (a type of irregular heartbeat) OR to reduce the risk of a blood clots forming after orthopedic surgery.  What do You need to know about Eliquis ? Take your Eliquis TWICE DAILY - one tablet in the morning and one tablet in the evening with or without food.  It would be best to take the doses about the same time each day.  If you have difficulty swallowing the tablet whole please discuss with your pharmacist how to take the medication safely.  Take Eliquis exactly as prescribed by your doctor and DO NOT stop taking Eliquis without talking to the doctor who prescribed the medication.  Stopping may increase your risk of developing a new clot or stroke.  Refill your prescription before you run out.  After discharge, you should have regular check-up appointments with your healthcare provider that is prescribing your Eliquis.  In the future your dose may need to be changed if your kidney function or weight changes by a significant amount or as you get older.  What do you do if you miss a dose? If you miss a dose, take it as soon as you remember on the same day and resume taking twice daily.  Do not take more than one dose of  ELIQUIS at the same time.  Important Safety Information A possible side effect of Eliquis is bleeding. You should call your healthcare provider right away if you experience any of the following: ? Bleeding from an injury or your nose that does not stop. ? Unusual colored urine (red or dark brown) or unusual colored stools (red or black). ? Unusual bruising for unknown reasons. ? A serious fall or if you hit your head (even if there is no bleeding).  Some medicines may interact with Eliquis and might increase your risk of bleeding or clotting while on Eliquis. To help avoid this, consult your healthcare provider or pharmacist prior to using any new prescription or non-prescription medications, including herbals, vitamins, non-steroidal anti-inflammatory drugs (NSAIDs) and supplements.  This website has more information on Eliquis (apixaban): [email or web address].no.

## 2019-07-27 NOTE — Discharge Summary (Signed)
Physician Discharge Summary  Karl Myers [id number] DOB: [birth year] DOA: 07/24/2019  PCP: Leone Haven, MD  Admit date: 07/24/2019 Discharge date: 07/27/2019  Admitted From: Home Disposition: Home   Recommendations for Outpatient Follow-up:  1. Follow up with PCP in the next week for hospital follow up appointment and: 1. To discuss further work up and management of abnormal thyroid function tests (see below).  2. To discuss treatment initiation vs. dietary counseling for HbA1c 6.5%. 2. Follow up with cardiology, Dr. Rockey Situ, due to new onset atrial vibrillation for which eliquis was started and atenolol was continued.  3. Please obtain CMP/CBC in one week 4. Please follow up on the following pending results: Free T3, free T4.  Home Health: None recommended Equipment/Devices: None recommended, no supplemental oxygen required Discharge Condition: Stable, improved CODE STATUS: DNR per patient at admission Diet recommendation: Heart healthy  Brief/Interim Summary: Karl Myers is a 74 y.o. male with a history of COPD, tobacco use, CAD, HTN, HLD, stage II CKD who developed cough, fever on 2/11 after attending a funeral on 2/6 for his sister who died of covid-19, presented to [facility] ED 2/15 with confusion, cough, and dyspnea. In the ED he was hypotensive but responsive to 2.5L LR. SARS-CoV-2 testing positive with bilateral multifocal infiltrates and hypoxia requiring 2L O2. Mentation improved and he was admitted to [facility] on remdesivir and steroids. Inflammatory markers have improved, hypoxemia rapidly resolved, and the patient's functional status has returned to baseline independence. On 2/17, cardiac monitoring detected brief atrial fibrillation confirmed by ECG as discussed with cardiology, Dr. Julianne Handler. Rate was not elevated, and the patient quickly converted back to sinus rhythm without other interventions. Anticoagulation was started for stroke risk reduction and will continue  with eliquis. The patient desperately requests discharge and is felt to be stable. He will return for 5th and final infusion of remdesivir on 2/19 and complete a course of oral decadron as outpatient.   Discharge Diagnoses:  Principal Problem:   Acute respiratory disease due to COVID-19 virus Active Problems:   Hypertension   CAD (coronary artery disease)   COPD (chronic obstructive pulmonary disease) (HCC)   Anxiety   Acute metabolic encephalopathy   Acute renal failure superimposed on stage 2 chronic kidney disease (HCC)   Hypotension   Hyponatremia   Acute hypoxemic respiratory failure due to COVID-19 [facility])  Acute hypoxemic respiratory failure due to covid-19 pneumonia: SARS-CoV-2 PCR positive on 2/15. Symptom onset 2/11, though he tested negative at that time, he developed similar symptoms at a similar time with suspected same exposure as his covid-positive wife.  - Has been liberated from supplemental oxygen for several days. - Continue remdesivir x5 days 2/15 - 2/19 - Steroids x10 days. CRP 5.6 > 2.7 > 1.3. PCT 0.11 not consistent with bacterial superinfection. Leukocytosis likely due to steroids. - Continue airborne, contact precautions for 21 days from positive testing.  AKI on stage II CKD: Improved with hydration.  - Avoid nephrotoxins - Monitor CrCl  Acute metabolic encephalopathy: Resolved. - Delirium precautions.   T2DM: Technically diagnostic HbA1c of 6.5%, though unclear what impact acute illness has had.  - Carb modified diet recommended to patient - PCP follow up recommended.  COPD, tobacco use (>50 years):  - Continue steroids as above, bonchodilators, and nicotine patch as needed.  - Cessation counseling provided.  CAD: No anginal complaints. ECG without ischemic changes. Had low risk stress testing Feb 2019. - Continue statin, prn NTG.   HTN:  - Ok  to restart home medications.  HLD:  - Continue statin. LFTs wnl.   Anxiety: Quiescent.  -  Continue qHS med  Paroxysmal atrial fibrillation: New diagnosis, though asymptomatic so unclear duration. Quickly spontaneously converted to NSR. - Patient of Dr. Donivan Scull in [place] with whom he will follow up. Discussed ECG 2/17 with Dr. Julianne Handler with cardiology who agrees with diagnosis of atrial fibrillation. - Due to CHA2DS2-VASc scoreof 3 (HTN, CAD, age (40)) or 52  with possible T2DM, anticoagulation initiated without bleeding noted. Will continue with eliquis. - Restart home atenolol  Abnormal thyroid function testing: During acute severe illness, unclear of accuracy of results, though they do suggest hyperthyroid state. No palpable goiter/nodularity/tenderness.  - TSH suppressed. Free T3 and T4 pending at discharge. Strongly recommended PCP follow up. Limiting immediate work up to Environmental health practitioner exposure to covid patient.  Discharge Instructions Discharge Instructions    Diet - low sodium heart healthy   Complete by: As directed    Discharge instructions   Complete by: As directed    - It is very important that you follow up with your primary doctor soon to discuss the next steps in work up of abnormal thyroid function tests. This could be due to your acute illness or actually due to thyroid problem.  - An irregular heart rhythm was noticed while you were admitted for a short amount of time. This is important because if the abnormal heart rhythm returns and the rate increases, this could cause damage to your heart.  - Please continue taking atenolol and seek medical attention right away if you notice palpitations or a fast heart rate.  - Start taking eliquis twice daily. This is a blood thinner that will reduce your risk of stroke (which is increased because of atrial fibrillation - or A Fib).  You are being discharged from the hospital after treatment for covid-19 infection. You are felt to be stable enough to no longer require inpatient monitoring, testing, and  treatment, though you will need to follow the recommendations below: - Continue taking decadron as directed (sent to your pharmacy), next dose tomorrow AM. - Per CDC guidelines, you will need to remain in isolation for 21 days from your first positive covid test. - Do not take NSAID medications (including, but not limited to, ibuprofen, advil, motrin, naproxen, aleve, goody's powder, etc.) - Follow up with your doctor in the next week via telehealth or seek medical attention right away if your symptoms get WORSE.  - Consider donating plasma after you have recovered (either 14 days after a negative test or 28 days after symptoms have completely resolved) because your antibodies to this virus may be helpful to give to others with life-threatening infections. Please go to the website www.[email or web address] if you would like to consider volunteering for plasma donation.    Directions for you at home:  Wear a facemask You should wear a facemask that covers your nose and mouth when you are in the same room with other people and when you visit a healthcare provider. People who live with or visit you should also wear a facemask while they are in the same room with you.  Separate yourself from other people in your home As much as possible, you should stay in a different room from other people in your home. Also, you should use a separate bathroom, if available.  Avoid sharing household items You should not share dishes, drinking glasses, cups, eating utensils, towels, bedding, or other items with other  people in your home. After using these items, you should wash them thoroughly with soap and water.  Cover your coughs and sneezes Cover your mouth and nose with a tissue when you cough or sneeze, or you can cough or sneeze into your sleeve. Throw used tissues in a lined trash can, and immediately wash your hands with soap and water for at least 20 seconds or use an alcohol-based hand rub.  Wash your  [facility] your hands often and thoroughly with soap and water for at least 20 seconds. You can use an alcohol-based hand sanitizer if soap and water are not available and if your hands are not visibly dirty. Avoid touching your eyes, nose, and mouth with unwashed hands.  Directions for those who live with, or provide care at home for you:  Limit the number of people who have contact with the patient If possible, have only one caregiver for the patient. Other household members should stay in another home or place of residence. If this is not possible, they should stay in another room, or be separated from the patient as much as possible. Use a separate bathroom, if available. Restrict visitors who do not have an essential need to be in the home.  Ensure good ventilation Make sure that shared spaces in the home have good air flow, such as from an air conditioner or an opened window, weather permitting.  Wash your hands often Wash your hands often and thoroughly with soap and water for at least 20 seconds. You can use an alcohol based hand sanitizer if soap and water are not available and if your hands are not visibly dirty. Avoid touching your eyes, nose, and mouth with unwashed hands. Use disposable paper towels to dry your hands. If not available, use dedicated cloth towels and replace them when they become wet.  Wear a facemask and gloves Wear a disposable facemask at all times in the room and gloves when you touch or have contact with the patient's blood, body fluids, and/or secretions or excretions, such as sweat, saliva, sputum, nasal mucus, vomit, urine, or feces.  Ensure the mask fits over your nose and mouth tightly, and do not touch it during use. Throw out disposable facemasks and gloves after using them. Do not reuse. Wash your hands immediately after removing your facemask and gloves. If your personal clothing becomes contaminated, carefully remove clothing and launder. Wash  your hands after handling contaminated clothing. Place all used disposable facemasks, gloves, and other waste in a lined container before disposing them with other household waste. Remove gloves and wash your hands immediately after handling these items.  Do not share dishes, glasses, or other household items with the patient Avoid sharing household items. You should not share dishes, drinking glasses, cups, eating utensils, towels, bedding, or other items with a patient who is confirmed to have, or being evaluated for, COVID-19 infection. After the person uses these items, you should wash them thoroughly with soap and water.  Wash laundry thoroughly Immediately remove and wash clothes or bedding that have blood, body fluids, and/or secretions or excretions, such as sweat, saliva, sputum, nasal mucus, vomit, urine, or feces, on them. Wear gloves when handling laundry from the patient. Read and follow directions on labels of laundry or clothing items and detergent. In general, wash and dry with the warmest temperatures recommended on the label.  Clean all areas the individual has used often Clean all touchable surfaces, such as counters, tabletops, doorknobs, bathroom fixtures, toilets,  phones, keyboards, tablets, and bedside tables, every day. Also, clean any surfaces that may have blood, body fluids, and/or secretions or excretions on them. Wear gloves when cleaning surfaces the patient has come in contact with. Use a diluted bleach solution (e.g., dilute bleach with 1 part bleach and 10 parts water) or a household disinfectant with a label that says EPA-registered for coronaviruses. To make a bleach solution at home, add 1 tablespoon of bleach to 1 quart (4 cups) of water. For a larger supply, add  cup of bleach to 1 gallon (16 cups) of water. Read labels of cleaning products and follow recommendations provided on product labels. Labels contain instructions for safe and effective use of the  cleaning product including precautions you should take when applying the product, such as wearing gloves or eye protection and making sure you have good ventilation during use of the product. Remove gloves and wash hands immediately after cleaning.  Monitor yourself for signs and symptoms of illness Caregivers and household members are considered close contacts, should monitor their health, and will be asked to limit movement outside of the home to the extent possible. Follow the monitoring steps for close contacts listed on the symptom monitoring form.  If you have additional questions, contact your local health department or call the epidemiologist on call at [phone] (available 24/7). This guidance is subject to change. For the most up-to-date guidance from [facility], please refer to their website: YouBlogs.pl   Increase activity slowly   Complete by: As directed    MyChart COVID-19 home monitoring program   Complete by: Jul 27, 2019    Is the patient willing to use the [place] for home monitoring?: Yes     Allergies as of 07/27/2019      Reactions   Hydrocodone-acetaminophen Other (See Comments)   With large quantities "hyper"   Neurontin [gabapentin] Other (See Comments)   With large quantities "hyper"      Medication List    STOP taking these medications   predniSONE 10 MG tablet Commonly known as: DELTASONE     TAKE these medications   ALPRAZolam 0.5 MG tablet Commonly known as: XANAX TAKE ONE-HALF TABLET BY MOUTH EVERY MORNING AND TAKE ONE TABLET BY MOUTH AT BEDTIME AS DIRECTED What changed: See the new instructions.   apixaban 5 MG Tabs tablet Commonly known as: Eliquis Take 1 tablet (5 mg total) by mouth 2 (two) times daily.   atenolol 25 MG tablet Commonly known as: TENORMIN TAKE ONE TABLET AT BEDTIME   atorvastatin 40 MG tablet Commonly known as: LIPITOR TAKE ONE TABLET BY MOUTH EVERY  DAY What changed: when to take this   benzonatate 200 MG capsule Commonly known as: TESSALON Take 1 capsule (200 mg total) by mouth 2 (two) times daily as needed for cough.   cholecalciferol 25 MCG (1000 UNIT) tablet Commonly known as: VITAMIN D3 Take 1,000 Units by mouth daily.   dexamethasone 6 MG tablet Commonly known as: Decadron Take 1 tablet (6 mg total) by mouth daily for 5 days. Start taking on: July 28, 2019   lisinopril 10 MG tablet Commonly known as: ZESTRIL TAKE ONE TABLET AT BEDTIME   nitroGLYCERIN 0.4 MG SL tablet Commonly known as: NITROSTAT Place 1 tablet (0.4 mg total) under the tongue every 5 (five) minutes as needed for chest pain. For up to 3 doses per episode.   tiZANidine 4 MG tablet Commonly known as: Zanaflex Take 1 tablet (4 mg total) by mouth every  6 (six) hours as needed for muscle spasms.   traZODone 150 MG tablet Commonly known as: DESYREL TAKE ONE TABLET AT BEDTIME   vitamin C 250 MG tablet Commonly known as: ASCORBIC ACID Take 250 mg by mouth daily.   Zinc 100 MG Tabs Take 100 mg by mouth daily.      Follow-up Information    Leone Haven, MD Follow up.   Specialty: Family Medicine Contact information: [address] [place]  [address] [phone]        Minna Merritts, MD. Schedule an appointment as soon as possible for a visit in 1 week(s).   Specialty: Cardiology Contact information: [place] [address] [phone]        [place]. Go on 07/28/2019.   Why: 11:30am. Drive to the security guard and tell them you are here for an infusion. They will direct you to the front entrance where we will come and get you.  For questions call [phone]. Contact information: [place] [phone]         Allergies  Allergen Reactions  . Hydrocodone-Acetaminophen Other (See Comments)    With  large quantities "hyper"  . Neurontin [Gabapentin] Other (See Comments)    With large quantities "hyper"     Consultations:  Cardiology by phone 2/17  Procedures/Studies: [facility] Chest Port 1 View  Result Date: 07/24/2019 CLINICAL DATA:  Altered mental status. Fever. COVID-19 positive on 07/20/2019. EXAM: PORTABLE CHEST 1 VIEW COMPARISON:  Chest x-ray dated 06/16/2019 FINDINGS: There is a small infiltrate at the left lung base posterior medially. There is slight haziness peripherally in the left mid and lower lung zone. There is also slight haziness at the right lung base with a small focal infiltrate medially. The heart size and pulmonary vascularity are normal. Bones are normal. IMPRESSION: Faint small bilateral pulmonary infiltrates, left greater than right. Electronically Signed   By: Lorriane Shire M.D.   On: 07/24/2019 11:50   Subjective: Feels well, eating well, ambulating at baseline, no dyspnea or chest pain or palpitations or bleeding.   Discharge Exam: Vitals:   07/27/19 0731 07/27/19 0915  BP: (!) 161/83 129/70  Pulse: (!) 52 82  Resp: 19 19  Temp: 97.9 F (36.6 C)   SpO2: 92% 95%   General: Pt is alert, awake, not in acute distress Cardiovascular: RRR, S1/S2 +, no rubs, no gallops Respiratory: Nonlabored and clear Abdominal: Soft, NT, ND, bowel sounds + Extremities: No edema, no cyanosis  Labs: Basic Metabolic Panel: Recent Labs  Lab 07/24/19 1122 07/25/19 0410 07/26/19 0330 07/27/19 0259  NA 131* 138 135 139  K 3.9 4.6 4.4 4.4  CL 97* 101 102 100  CO2 25 26 23 30   GLUCOSE 116* 134* 148* 143*  BUN 30* 28* 30* 33*  CREATININE 1.34* 0.93 0.76 0.97  CALCIUM 8.4* 8.6* 8.5* 8.6*  MG  --  1.8  --  1.9   Liver Function Tests: Recent Labs  Lab 07/24/19 1122 07/25/19 0410 07/26/19 0330 07/27/19 0259  AST 23 33 36 29  ALT 18 30 31 29   ALKPHOS 54 55 51 54  BILITOT 0.6 0.3 0.3 0.6  PROT 6.4* 6.0* 6.1* 5.9*  ALBUMIN 3.5 3.3* 3.2* 3.1*   Recent Labs  Lab  07/24/19 1122  LIPASE 26   CBC: Recent Labs  Lab 07/24/19 1122 07/25/19 0410 07/26/19 0330  WBC 8.4 6.7 12.8*  NEUTROABS 6.2  5.2 10.4*  HGB 13.4 13.9 14.0  HCT 40.3 41.0 41.0  MCV 97.1 96.9 96.2  PLT 163 148* 163   CBG: Recent Labs  Lab 07/24/19 2236 07/26/19 1206  GLUCAP 191* 124*   D-Dimer Recent Labs    07/25/19 0410 07/26/19 0330  DDIMER 0.49 0.43   Hgb A1c Recent Labs    07/25/19 0410  HGBA1C 6.5*   Thyroid function studies Recent Labs    07/27/19 0259  TSH 0.043*   Anemia work up Recent Labs    07/25/19 0410 07/26/19 0330  FERRITIN 430* 469*   Urinalysis    Component Value Date/Time   COLORURINE YELLOW (A) 07/24/2019 1508   APPEARANCEUR CLEAR (A) 07/24/2019 1508   LABSPEC 1.006 07/24/2019 1508   PHURINE 6.0 07/24/2019 1508   GLUCOSEU NEGATIVE 07/24/2019 1508   HGBUR NEGATIVE 07/24/2019 1508   BILIRUBINUR NEGATIVE 07/24/2019 1508   [place] 07/24/2019 1508   PROTEINUR NEGATIVE 07/24/2019 1508   NITRITE NEGATIVE 07/24/2019 1508   LEUKOCYTESUR NEGATIVE 07/24/2019 1508    Microbiology Recent Results (from the past 240 hour(s))  Novel Coronavirus, NAA ([facility])     Status: None   Collection Time: 07/20/19  2:19 PM   Specimen: Nasopharyngeal(NP) swabs in vial transport medium   NASOPHARYNGE  TESTING  Result Value Ref Range Status   SARS-CoV-2, NAA Not Detected Not Detected Final    Comment: This nucleic acid amplification test was developed and its performance characteristics determined by [employer], [employer] and [employer]. Nucleic acid amplification tests include RT-PCR and TMA. This test has not been FDA cleared or approved. This test has been authorized by FDA under an Emergency Use Authorization (EUA). This test is only authorized for the duration of time the declaration that circumstances exist justifying the authorization of the emergency use of in vitro diagnostic tests for detection of SARS-CoV-2 virus and/or diagnosis of COVID-19  infection under section 564(b)(1) of the Act, 21 U.S.C. [id number]) (1), unless the authorization is terminated or revoked sooner. When diagnostic testing is negative, the possibility of a false negative result should be considered in the context of a patient's recent exposures and the presence of clinical signs and symptoms consistent with COVID-19. An individual without symptoms of COVID-19 and who is not shedding SARS-CoV-2 virus wo uld expect to have a negative (not detected) result in this assay.   Blood Culture (routine x 2)     Status: None (Preliminary result)   Collection Time: 07/24/19 11:22 AM   Specimen: BLOOD  Result Value Ref Range Status   Specimen Description BLOOD BLOOD RIGHT FOREARM  Final   Special Requests   Final    BOTTLES DRAWN AEROBIC AND ANAEROBIC Blood Culture adequate volume   Culture   Final    NO GROWTH 3 DAYS Performed at [facility], [address], [place], [place] [address]    Report Status PENDING  Incomplete  Blood Culture (routine x 2)     Status: None (Preliminary result)   Collection Time: 07/24/19 12:17 PM   Specimen: BLOOD  Result Value Ref Range Status   Specimen Description BLOOD BLOOD RIGHT WRIST  Final   Special Requests   Final    BOTTLES DRAWN AEROBIC AND ANAEROBIC Blood Culture results may not be optimal due to an inadequate volume of blood received in culture bottles   Culture   Final    NO GROWTH 3 DAYS Performed at [facility], [address] [place], [place] [address]    Report Status PENDING  Incomplete  Respiratory Panel by RT PCR (Flu A&B, Covid) - Nasopharyngeal Swab     Status: Abnormal   Collection Time: 07/24/19 12:32 PM   Specimen: Nasopharyngeal Swab  Result Value Ref Range Status   SARS Coronavirus 2 by RT PCR POSITIVE (A) NEGATIVE Final    Comment: RESULT CALLED TO, READ BACK BY AND VERIFIED WITH: BILL SMITH AT [id number] ON 07/24/2019 [place]. (NOTE) SARS-CoV-2 target nucleic acids are  DETECTED. SARS-CoV-2 RNA is generally detectable in upper respiratory specimens  during the acute phase of infection. Positive results are indicative of the presence of the identified virus, but do not rule out bacterial infection or co-infection with other pathogens not detected by the test. Clinical correlation with patient history and other diagnostic information is necessary to determine patient infection status. The expected result is Negative. Fact Sheet for Patients:  PinkCheek.be Fact Sheet for Healthcare Providers: GravelBags.it This test is not yet approved or cleared by the [place] FDA and  has been authorized for detection and/or diagnosis of SARS-CoV-2 by FDA under an Emergency Use Authorization (EUA).  This EUA will remain in effect (meaning this test can be used)  for the duration of  the COVID-19 declaration under Section 564(b)(1) of the Act, 21 U.S.C. section 360bbb-3(b)(1), unless the authorization is terminated or revoked sooner.    Influenza A by PCR NEGATIVE NEGATIVE Final   Influenza B by PCR NEGATIVE NEGATIVE Final    Comment: (NOTE) The Xpert Xpress SARS-CoV-2/FLU/RSV assay is intended as an aid in  the diagnosis of influenza from Nasopharyngeal swab specimens and  should not be used as a sole basis for treatment. Nasal washings and  aspirates are unacceptable for Xpert Xpress SARS-CoV-2/FLU/RSV  testing. Fact Sheet for Patients: PinkCheek.be Fact Sheet for Healthcare Providers: GravelBags.it This test is not yet approved or cleared by the [place] FDA and  has been authorized for detection and/or diagnosis of SARS-CoV-2 by  FDA under an Emergency Use Authorization (EUA). This EUA will remain  in effect (meaning this test can be used) for the duration of the  Covid-19 declaration under Section 564(b)(1) of the Act, 21  U.S.C.  section 360bbb-3(b)(1), unless the authorization is  terminated or revoked. Performed at [facility], [address] [place], [place] [address]   Urine culture     Status: None   Collection Time: 07/24/19  3:08 PM   Specimen: In/Out Cath Urine  Result Value Ref Range Status   Specimen Description   Final    IN/OUT CATH URINE Performed at [facility], [address], [place], [place] [address]    Special Requests   Final    NONE Performed at [facility], [address] [place], [place] [address]    Culture   Final    NO GROWTH Performed at [facility], [place] [address] [place], [place] [address]    Report Status 07/25/2019 FINAL  Final    Time coordinating discharge: Approximately 40 minutes  Patrecia Pour, MD  [facility] 07/27/2019, 10:20 AM

## 2019-07-27 NOTE — Progress Notes (Signed)
ANTICOAGULATION CONSULT NOTE - Initial Consult  Pharmacy Consult for Lovenox>>Eliquis Indication: atrial fibrillation  Allergies  Allergen Reactions  . Hydrocodone-Acetaminophen Other (See Comments)    With large quantities "hyper"  . Neurontin [Gabapentin] Other (See Comments)    With large quantities "hyper"     Patient Measurements: Height: 5\' 8"  (172.7 cm) Weight: 160 lb (72.6 kg) IBW/kg (Calculated) : 68.4 Heparin Dosing Weight: actual weight  Vital Signs: Temp: 97.9 F (36.6 C) (02/18 0731) Temp Source: Oral (02/18 0731) BP: 129/70 (02/18 0915) Pulse Rate: 82 (02/18 0915)  Labs: Recent Labs    07/24/19 1122 07/24/19 1122 07/25/19 0410 07/26/19 0330 07/27/19 0259  HGB 13.4   < > 13.9 14.0  --   HCT 40.3  --  41.0 41.0  --   PLT 163  --  148* 163  --   APTT 35  --   --   --   --   LABPROT 13.7  --   --   --   --   INR 1.1  --   --   --   --   CREATININE 1.34*   < > 0.93 0.76 0.97   < > = values in this interval not displayed.    Estimated Creatinine Clearance: 65.6 mL/min (by C-G formula based on SCr of 0.97 mg/dL).   Medical History: Past Medical History:  Diagnosis Date  . Arthritis   . COPD (chronic obstructive pulmonary disease) ([place])   . DDD (degenerative disc disease), cervical   . DDD (degenerative disc disease), cervical   . Depression   . Hypertension   . Pre-diabetes     Medications:  Scheduled:  . ALPRAZolam  0.5 mg Oral QHS  . atorvastatin  40 mg Oral q1800  . dexamethasone (DECADRON) injection  6 mg Intravenous Q24H  . enoxaparin (LOVENOX) injection  1 mg/kg Subcutaneous BID  . insulin aspart  0-9 Units Subcutaneous TID WC  . sodium chloride flush  3 mL Intravenous Q12H    Assessment: 74 y/o M admitted with COVID-19 PNA and new-onset atrial fibrillation.   This patients CHA2DS2-VASc Score and unadjusted Ischemic Stroke Rate (% per year) is equal to 4.8 % stroke rate/year from a score of 4   Last Lovenox dose given at 0800  this AM    Plan:  -Transition to Eliquis 5 mg bid starting tonight. Discharge orders in, will not enter inpatient orders for Eliquis or D/C Lovenox unless discharge postponed -sending Eliquis trial card to [place] -Education complete -Monitor renal function, CBC, s/s of bleeding   Ulice Dash D 07/27/2019,10:34 AM

## 2019-07-27 NOTE — Care Management Important Message (Signed)
Important Message  Patient Details  Name: Srinath Blackmer MRN: [id number] Date of Birth: 05-31-1946   Medicare Important Message Given:  Yes - Important Message mailed due to current National Emergency  Verbal consent obtained due to current National Emergency  Relationship to patient: Spouse/Significant Other Contact Name: Cace Orlov Call Date: 07/27/19  Time: 1027 Phone: [phone] Outcome: Spoke with contact Important Message mailed to: Other (must enter comment)(Declined additional copy of IM but is aware one is available if needed)    Delorse Lek 07/27/2019, 10:28 AM

## 2019-07-27 NOTE — Progress Notes (Signed)
Occupational Therapy Treatment Patient Details Name: Karl Myers MRN: [id number] DOB: 04-17-1946 Today's Date: 07/27/2019    History of present illness 74 y.o. male with medical history significant for coronary artery disease, COPD, chronic low back pain, hypertension, and anxiety, now presenting to the emergency department for evaluation of confusion, fevers, cough, and shortness of breath.   OT comments  Patient at EOB on arrival with nursing.  He stated he was feeling very good and was ready to go home.  Patient completed UB and LB dressing independently.  Walked in hall 280 ft with RW and modified independence.  Patient on room air and had no shortness of breath.  Instructed patient on UE theraband strengthening exercises and practiced.  Answered questions about plans for discharge and safety when returning home.  Patient is good to return home today with no OT follow up.   Follow Up Recommendations  No OT follow up;Supervision - Intermittent    Equipment Recommendations  None recommended by OT    Recommendations for Other Services      Precautions / Restrictions Precautions Precautions: None Restrictions Weight Bearing Restrictions: No       Mobility Bed Mobility Overal bed mobility: Independent                Transfers Overall transfer level: Modified independent Equipment used: Rolling walker (2 wheeled) Transfers: Sit to/from Stand Sit to Stand: Modified independent (Device/Increase time)              Balance Overall balance assessment: Mild deficits observed, not formally tested                                         ADL either performed or assessed with clinical judgement   ADL Overall ADL's : Needs assistance/impaired     Grooming: Independent;Standing           Upper Body Dressing : Independent;Sitting   Lower Body Dressing: Independent;Sit to/from stand               Functional mobility during ADLs:  Modified independent;Rolling walker       Vision       Perception     Praxis      Cognition Arousal/Alertness: Awake/alert Behavior During Therapy: WFL for tasks assessed/performed Overall Cognitive Status: Within Functional Limits for tasks assessed                                          Exercises Exercises: General Upper Extremity;Other exercises General Exercises - Upper Extremity Shoulder ABduction: AROM;10 reps;Strengthening;Theraband Shoulder Horizontal ABduction: AROM;Strengthening;10 reps;Theraband Elbow Flexion: AROM;10 reps;Strengthening;Theraband Elbow Extension: AROM;Strengthening;10 reps;Theraband Other Exercises Other Exercises: Walk 280 ft   Shoulder Instructions       General Comments      Pertinent Vitals/ Pain       Pain Assessment: No/denies pain  Home Living                                          Prior Functioning/Environment              Frequency  Min 3X/week        Progress Toward Goals  OT Goals(current  goals can now be found in the care plan section)  Progress towards OT goals: Progressing toward goals  Acute Rehab OT Goals Patient Stated Goal: Go home to wife OT Goal Formulation: With patient Time For Goal Achievement: 08/08/19 Potential to Achieve Goals: Good  Plan Discharge plan remains appropriate    Co-evaluation                 AM-PAC OT "6 Clicks" Daily Activity     Outcome Measure   Help from another person eating meals?: None Help from another person taking care of personal grooming?: None Help from another person toileting, which includes using toliet, bedpan, or urinal?: None Help from another person bathing (including washing, rinsing, drying)?: None Help from another person to put on and taking off regular upper body clothing?: None Help from another person to put on and taking off regular lower body clothing?: None 6 Click Score: 24    End of Session  Equipment Utilized During Treatment: Rolling walker  OT Visit Diagnosis: Unsteadiness on feet (R26.81);Muscle weakness (generalized) (M62.81)   Activity Tolerance Patient tolerated treatment well   Patient Left in bed;with call bell/phone within reach   Nurse Communication Mobility status        Time: [id number] OT Time Calculation (min): 27 min  Charges: OT General Charges $OT Visit: 1 Visit OT Treatments $Self Care/Home Management : 23-37 mins  August Luz, OTR/L    [name] 07/27/2019, 11:03 AM

## 2019-07-28 ENCOUNTER — Encounter (HOSPITAL_COMMUNITY): Payer: Self-pay

## 2019-07-28 ENCOUNTER — Ambulatory Visit (HOSPITAL_COMMUNITY)
Admission: RE | Admit: 2019-07-28 | Discharge: 2019-07-28 | Disposition: A | Discharge status: Home / Self Care | Payer: PPO | Source: Ambulatory Visit | Attending: Pulmonary Disease | Admitting: Pulmonary Disease

## 2019-07-28 ENCOUNTER — Other Ambulatory Visit: Payer: Self-pay

## 2019-07-28 ENCOUNTER — Telehealth (INDEPENDENT_AMBULATORY_CARE_PROVIDER_SITE_OTHER): Payer: PPO | Admitting: Family

## 2019-07-28 ENCOUNTER — Encounter: Payer: Self-pay | Admitting: Family

## 2019-07-28 ENCOUNTER — Telehealth: Payer: Self-pay

## 2019-07-28 VITALS — BP 141/81 | HR 56 | Temp 97.8°F | Resp 17

## 2019-07-28 VITALS — BP 143/86 | HR 66 | Ht 68.0 in | Wt 166.0 lb

## 2019-07-28 DIAGNOSIS — J9601 Acute respiratory failure with hypoxia: Secondary | ICD-10-CM | POA: Insufficient documentation

## 2019-07-28 DIAGNOSIS — I480 Paroxysmal atrial fibrillation: Secondary | ICD-10-CM

## 2019-07-28 DIAGNOSIS — I10 Essential (primary) hypertension: Secondary | ICD-10-CM

## 2019-07-28 DIAGNOSIS — Z720 Tobacco use: Secondary | ICD-10-CM

## 2019-07-28 DIAGNOSIS — U071 COVID-19: Secondary | ICD-10-CM

## 2019-07-28 DIAGNOSIS — I25118 Atherosclerotic heart disease of native coronary artery with other forms of angina pectoris: Secondary | ICD-10-CM

## 2019-07-28 DIAGNOSIS — E782 Mixed hyperlipidemia: Secondary | ICD-10-CM

## 2019-07-28 LAB — T3, FREE: T3, Free: 3 pg/mL (ref 2.0–4.4)

## 2019-07-28 MED ORDER — SODIUM CHLORIDE 0.9 % IV SOLN
100.0000 mg | Freq: Once | INTRAVENOUS | Status: AC
  Administered 2019-07-28: 12:00:00 100 mg via INTRAVENOUS
  Filled 2019-07-28: qty 20

## 2019-07-28 MED ORDER — DIPHENHYDRAMINE HCL 50 MG/ML IJ SOLN: 50.0000 mg | Freq: Once | INTRAMUSCULAR | Status: DC | PRN

## 2019-07-28 MED ORDER — APIXABAN 5 MG PO TABS: 5.0000 mg | ORAL_TABLET | Freq: Two times a day (BID) | ORAL | 1 refills | Status: DC

## 2019-07-28 MED ORDER — EPINEPHRINE 0.3 MG/0.3ML IJ SOAJ: 0.3000 mg | Freq: Once | INTRAMUSCULAR | Status: DC | PRN

## 2019-07-28 MED ORDER — ALBUTEROL SULFATE HFA 108 (90 BASE) MCG/ACT IN AERS: 2.0000 | INHALATION_SPRAY | Freq: Once | RESPIRATORY_TRACT | Status: DC | PRN

## 2019-07-28 MED ORDER — FAMOTIDINE IN NACL 20-0.9 MG/50ML-% IV SOLN: 20.0000 mg | Freq: Once | INTRAVENOUS | Status: DC | PRN

## 2019-07-28 MED ORDER — METHYLPREDNISOLONE SODIUM SUCC 125 MG IJ SOLR: 125.0000 mg | Freq: Once | INTRAMUSCULAR | Status: DC | PRN

## 2019-07-28 MED ORDER — SODIUM CHLORIDE 0.9 % IV SOLN: INTRAVENOUS | Status: DC | PRN

## 2019-07-28 NOTE — Progress Notes (Signed)
  Diagnosis: COVID-19  Physician: Dr. Joya Gaskins   Procedure: [facility] Med: remdesivir infusion.  Complications: No immediate complications noted.  Discharge: Discharged home   Karl Myers N [name] 07/28/2019

## 2019-07-28 NOTE — Telephone Encounter (Signed)
Transition Care Management Follow-up Telephone Call  Date of discharge and from where: 07/27/19 from [facility].  How have you been since you were released from the hospital? Tired with little appetite. Denies confusion, difficulty breathing, headache, pain. Paces self to reserve energy. Last infusion treatment completed today. BM/urine output good.   Any questions or concerns? Resolve thyroid issue.   Items Reviewed:  Did the pt receive and understand the discharge instructions provided? Yes.  Medications obtained and verified? Yes, taking as scheduled and without issue.   Any new allergies since your discharge? None.  Dietary orders reviewed? Yes, heart healthy.  Do you have support at home? Yes, wife.   Functional Questionnaire: (I = Independent and D = Dependent) ADLs: I  Bathing/Dressing- I  Meal Prep- I  Eating- I  Maintaining continence- I  Transferring/Ambulation- I  Managing Meds- I  Follow up appointments reviewed:   PCP Hospital f/u appt confirmed?  Scheduled to see Dr. Caryl Bis on 08/01/19 @ 11:30 via doxy [id number].  Cardiology appt confirmed?  Yes  Are transportation arrangements needed? No  If their condition worsens, is the pt aware to call PCP or go to the Emergency Dept.? Yes  Was the patient provided with contact information for the PCP's office or ED? Yes  Was to pt encouraged to call back with questions or concerns? Yes

## 2019-07-28 NOTE — Patient Instructions (Signed)
Medication Instructions:   Continue your current medications.  A 90-day supply of Eliquis was sent to [facility].  *If you need a refill on your cardiac medications before your next appointment, please call your pharmacy*  Lab Work: No lab work ordered today. We will plan to check your blood counts when we see you in the office next. You do not need to fast for these labs.   If you have labs (blood work) drawn today and your tests are completely normal, you will receive your results only by: Marland Kitchen MyChart Message (if you have MyChart) OR . A paper copy in the mail If you have any lab test that is abnormal or we need to change your treatment, we will call you to review the results.  Testing/Procedures: None ordered today.  Follow-Up: At [facility], you and your health needs are our priority.  As part of our continuing mission to provide you with exceptional heart care, we have created designated Provider Care Teams.  These Care Teams include your primary Cardiologist (physician) and Advanced Practice Providers (APPs -  Physician Assistants and Nurse Practitioners) who all work together to provide you with the care you need, when you need it.  Your next appointment:   4 week(s) in person with Dr. Rockey Situ.  Your appointment is scheduled for 08/23/2019 at 10:20 AM.  Other Instructions  It was a pleasure to meet you and your wife today! Thank you for doing a video visit. Hope you continue to feel better and better each day. Remember to rest and stay well hydrated. I have included information on atrial fibrillation and Eliquis below. Atrial fibrillation is a common arrhythmia and has a risk of stroke, this is why you were started on Eliquis which is a blood thinner. Be sure to report any blood in your urine or stool. We recommend avoiding Aspirin, Advil, Ibuprofen while on Eliquis as it increases risk of bleeding. It is safe to take Acetaminophen or Tylenol.  Atrial  Fibrillation  Atrial fibrillation is a type of heartbeat that is irregular or fast. If you have this condition, your heart beats without any order. This makes it hard for your heart to pump blood in a normal way. Atrial fibrillation may come and go, or it may become a long-lasting problem. If this condition is not treated, it can put you at higher risk for stroke, heart failure, and other heart problems. What are the causes? This condition may be caused by diseases that damage the heart. They include:  High blood pressure.  Heart failure.  Heart valve disease.  Heart surgery. Other causes include:  Diabetes.  Thyroid disease.  Being overweight.  Kidney disease. Sometimes the cause is not known. What increases the risk? You are more likely to develop this condition if:  You are older.  You smoke.  You exercise often and very hard.  You have a family history of this condition.  You are a man.  You use drugs.  You drink a lot of alcohol.  You have lung conditions, such as emphysema, pneumonia, or COPD.  You have sleep apnea. What are the signs or symptoms? Common symptoms of this condition include:  A feeling that your heart is beating very fast.  Chest pain or discomfort.  Feeling short of breath.  Suddenly feeling light-headed or weak.  Getting tired easily during activity.  Fainting.  Sweating. In some cases, there are no symptoms. How is this treated? Treatment for this condition depends on  underlying conditions and how you feel when you have atrial fibrillation. They include:  Medicines to: ? Prevent blood clots. ? Treat heart rate or heart rhythm problems.  Using devices, such as a pacemaker, to correct heart rhythm problems.  Doing surgery to remove the part of the heart that sends bad signals.  Closing an area where clots can form in the heart (left atrial appendage). In some cases, your doctor will treat other underlying  conditions. Follow these instructions at home: Medicines  Take over-the-counter and prescription medicines only as told by your doctor.  Do not take any new medicines without first talking to your doctor.  If you are taking blood thinners: ? Talk with your doctor before you take any medicines that have aspirin or NSAIDs, such as ibuprofen, in them. ? Take your medicine exactly as told by your doctor. Take it at the same time each day. ? Avoid activities that could hurt or bruise you. Follow instructions about how to prevent falls. ? Wear a bracelet that says you are taking blood thinners. Or, carry a card that lists what medicines you take. Lifestyle      Do not use any products that have nicotine or tobacco in them. These include cigarettes, e-cigarettes, and chewing tobacco. If you need help quitting, ask your doctor.  Eat heart-healthy foods. Talk with your doctor about the right eating plan for you.  Exercise regularly as told by your doctor.  Do not drink alcohol.  Lose weight if you are overweight.  Do not use drugs, including cannabis. General instructions  If you have a condition that causes breathing to stop for a short period of time (apnea), treat it as told by your doctor.  Keep a healthy weight. Do not use diet pills unless your doctor says they are safe for you. Diet pills may make heart problems worse.  Keep all follow-up visits as told by your doctor. This is important. Contact a doctor if:  You notice a change in the speed, rhythm, or strength of your heartbeat.  You are taking a blood-thinning medicine and you get more bruising.  You get tired more easily when you move or exercise.  You have a sudden change in weight. Get help right away if:   You have pain in your chest or your belly (abdomen).  You have trouble breathing.  You have side effects of blood thinners, such as blood in your vomit, poop (stool), or pee (urine), or bleeding that cannot  stop.  You have any signs of a stroke. "BE FAST" is an easy way to remember the main warning signs: ? B - Balance. Signs are dizziness, sudden trouble walking, or loss of balance. ? E - Eyes. Signs are trouble seeing or a change in how you see. ? F - Face. Signs are sudden weakness or loss of feeling in the face, or the face or eyelid drooping on one side. ? A - Arms. Signs are weakness or loss of feeling in an arm. This happens suddenly and usually on one side of the body. ? S - Speech. Signs are sudden trouble speaking, slurred speech, or trouble understanding what people say. ? T - Time. Time to call emergency services. Write down what time symptoms started.  You have other signs of a stroke, such as: ? A sudden, very bad headache with no known cause. ? Feeling like you may vomit (nausea). ? Vomiting. ? A seizure. These symptoms may be an emergency. Do not wait  to see if the symptoms will go away. Get medical help right away. Call your local emergency services (911 in the [place] Do not drive yourself to the hospital. Summary  Atrial fibrillation is a type of heartbeat that is irregular or fast.  You are at higher risk of this condition if you smoke, are older, have diabetes, or are overweight.  Follow your doctor's instructions about medicines, diet, exercise, and follow-up visits.  Get help right away if you have signs or symptoms of a stroke.  Get help right away if you cannot catch your breath, or you have chest pain or discomfort. This information is not intended to replace advice given to you by your health care provider. Make sure you discuss any questions you have with your health care provider. Document Revised: 11/16/2018 Document Reviewed: 11/16/2018 Elsevier Patient Education  [place].  Apixaban oral tablets What is this medicine? APIXABAN (a PIX a ban) is an anticoagulant (blood thinner). It is used to lower the chance of stroke in people with a medical  condition called atrial fibrillation. It is also used to treat or prevent blood clots in the lungs or in the veins. This medicine may be used for other purposes; ask your health care provider or pharmacist if you have questions. COMMON BRAND NAME(S): Eliquis What should I tell my health care provider before I take this medicine? They need to know if you have any of these conditions:  antiphospholipid antibody syndrome  bleeding disorders  bleeding in the brain  blood in your stools (black or tarry stools) or if you have blood in your vomit  history of blood clots  history of stomach bleeding  kidney disease  liver disease  mechanical heart valve  an unusual or allergic reaction to apixaban, other medicines, foods, dyes, or preservatives  pregnant or trying to get pregnant  breast-feeding How should I use this medicine? Take this medicine by mouth with a glass of water. Follow the directions on the prescription label. You can take it with or without food. If it upsets your stomach, take it with food. Take your medicine at regular intervals. Do not take it more often than directed. Do not stop taking except on your doctor's advice. Stopping this medicine may increase your risk of a blood clot. Be sure to refill your prescription before you run out of medicine. Talk to your pediatrician regarding the use of this medicine in children. Special care may be needed. Overdosage: If you think you have taken too much of this medicine contact a poison control center or emergency room at once. NOTE: This medicine is only for you. Do not share this medicine with others. What if I miss a dose? If you miss a dose, take it as soon as you can. If it is almost time for your next dose, take only that dose. Do not take double or extra doses. What may interact with this medicine? This medicine may interact with the following:  aspirin and aspirin-like medicines  certain medicines for fungal  infections like ketoconazole and itraconazole  certain medicines for seizures like carbamazepine and phenytoin  certain medicines that treat or prevent blood clots like warfarin, enoxaparin, and dalteparin  clarithromycin  NSAIDs, medicines for pain and inflammation, like ibuprofen or naproxen  rifampin  ritonavir  St. John's wort This list may not describe all possible interactions. Give your health care provider a list of all the medicines, herbs, non-prescription drugs, or dietary supplements you use. Also tell  them if you smoke, drink alcohol, or use illegal drugs. Some items may interact with your medicine. What should I watch for while using this medicine? Visit your healthcare professional for regular checks on your progress. You may need blood work done while you are taking this medicine. Your condition will be monitored carefully while you are receiving this medicine. It is important not to miss any appointments. Avoid sports and activities that might cause injury while you are using this medicine. Severe falls or injuries can cause unseen bleeding. Be careful when using sharp tools or knives. Consider using an Copy. Take special care brushing or flossing your teeth. Report any injuries, bruising, or red spots on the skin to your healthcare professional. If you are going to need surgery or other procedure, tell your healthcare professional that you are taking this medicine. Wear a medical ID bracelet or chain. Carry a card that describes your disease and details of your medicine and dosage times. What side effects may I notice from receiving this medicine? Side effects that you should report to your doctor or health care professional as soon as possible:  allergic reactions like skin rash, itching or hives, swelling of the face, lips, or tongue  signs and symptoms of bleeding such as bloody or black, tarry stools; red or dark-brown urine; spitting up blood or brown  material that looks like coffee grounds; red spots on the skin; unusual bruising or bleeding from the eye, gums, or nose  signs and symptoms of a blood clot such as chest pain; shortness of breath; pain, swelling, or warmth in the leg  signs and symptoms of a stroke such as changes in vision; confusion; trouble speaking or understanding; severe headaches; sudden numbness or weakness of the face, arm or leg; trouble walking; dizziness; loss of coordination This list may not describe all possible side effects. Call your doctor for medical advice about side effects. You may report side effects to FDA at 1-800-FDA-1088. Where should I keep my medicine? Keep out of the reach of children. Store at room temperature between 20 and 25 degrees C (68 and 77 degrees F). Throw away any unused medicine after the expiration date. NOTE: This sheet is a summary. It may not cover all possible information. If you have questions about this medicine, talk to your doctor, pharmacist, or health care provider.  2020 Elsevier/Gold Standard (2018-02-02 17:39:34)

## 2019-07-28 NOTE — Discharge Instructions (Signed)
10 Things You Can Do to Manage Your COVID-19 Symptoms at Home If you have possible or confirmed COVID-19: 1. Stay home from work and school. And stay away from other public places. If you must go out, avoid using any kind of public transportation, ridesharing, or taxis. 2. Monitor your symptoms carefully. If your symptoms get worse, call your healthcare provider immediately. 3. Get rest and stay hydrated. 4. If you have a medical appointment, call the healthcare provider ahead of time and tell them that you have or may have COVID-19. 5. For medical emergencies, call 911 and notify the dispatch personnel that you have or may have COVID-19. 6. Cover your cough and sneezes with a tissue or use the inside of your elbow. 7. Wash your hands often with soap and water for at least 20 seconds or clean your hands with an alcohol-based hand sanitizer that contains at least 60% alcohol. 8. As much as possible, stay in a specific room and away from other people in your home. Also, you should use a separate bathroom, if available. If you need to be around other people in or outside of the home, wear a mask. 9. Avoid sharing personal items with other people in your household, like dishes, towels, and bedding. 10. Clean all surfaces that are touched often, like counters, tabletops, and doorknobs. Use household cleaning sprays or wipes according to the label instructions. cdc.gov/coronavirus 12/07/2018 This information is not intended to replace advice given to you by your health care provider. Make sure you discuss any questions you have with your health care provider. Document Revised: 05/11/2019 Document Reviewed: 05/11/2019 Elsevier Patient Education  2020 [employer]vier Inc.  

## 2019-07-28 NOTE — Progress Notes (Signed)
Virtual Visit via Video Note   This visit type was conducted due to national recommendations for restrictions regarding the COVID-19 Pandemic (e.g. social distancing) in an effort to limit this patient's exposure and mitigate transmission in our community.  Due to his co-morbid illnesses, this patient is at least at moderate risk for complications without adequate follow up.  This format is felt to be most appropriate for this patient at this time.  All issues noted in this document were discussed and addressed.  A limited physical exam was performed with this format.  Please refer to the patient's chart for his consent to telehealth for [facility].   Date:  07/28/2019   ID:  Karl Myers, DOB [birth year], MRN [id number]  Patient Location: Home Provider Location: Office  PCP:  Leone Haven, MD  Cardiologist:  No primary care provider on file.  Electrophysiologist:  None   Evaluation Performed:  Follow-Up Visit  Chief Complaint:  Follow up after hospitalization with new atrial fibrillation  History of Present Illness:    Karl Myers is a 74 y.o. male with HTN, HLD, previous tobacco abuse, CAD on previous CT, fatigue, COPD, CKDII. He was last seen by Dr. Rockey Situ 03/2019.   Admitted to [facility] 07/24/19 - 07/27/19 with COVID-19. Treated with redmesivir and steroids. 07/26/19 had brief episode of atrial fibrillation confirmed by Dr. Julianne Handler. Rate not elevated and he self converted. He was started on Eliquis.   He is grateful to be home and appreciative of the care he received in the hospital. Reports feeling a "bit better each day" still with some fatigue and shortness of breath.   Denies chest pain, pressure, tightness.   Majority of our visit was spent discussing atrial fibrillation. Long discussion with him and his wife. Discussed the need for anticoagulation for prevention of stroke. Discussed avoidance of aspirin/NSAIDs. He was educated to report signs of bleeding.    The patient does have symptoms concerning for COVID-19 infection (fever, chills, cough, or new shortness of breath).   Past Medical History:  Diagnosis Date  . Arthritis   . COPD (chronic obstructive pulmonary disease) ([place])   . DDD (degenerative disc disease), cervical   . DDD (degenerative disc disease), cervical   . Depression   . Hypertension   . Pre-diabetes    Past Surgical History:  Procedure Laterality Date  . BACK SURGERY     x 3  . CARPAL TUNNEL RELEASE Right 08/19/2017   Procedure: CARPAL TUNNEL RELEASE;  Surgeon: Hessie Knows, MD;  Location: [facility] ORS;  Service: Orthopedics;  Laterality: Right;  . CATARACT EXTRACTION W/PHACO Right 11/17/2016   Procedure: CATARACT EXTRACTION PHACO AND INTRAOCULAR LENS PLACEMENT (IOC);  Surgeon: Birder Robson, MD;  Location: [facility] ORS;  Service: Ophthalmology;  Laterality: Right;  [place] 00:37 AP% 12.6 CDE 4.69 Fluid pack lot # [id number]  . CATARACT EXTRACTION W/PHACO Left 12/08/2016   Procedure: CATARACT EXTRACTION PHACO AND INTRAOCULAR LENS PLACEMENT (IOC);  Surgeon: Birder Robson, MD;  Location: [facility] ORS;  Service: Ophthalmology;  Laterality: Left;  [place] 00:33 AP% 13.9 CDE 4.63 Fluid pack lot # [id number]  . ULNAR TUNNEL RELEASE Right 08/19/2017   Procedure: CUBITAL TUNNEL RELEASE;  Surgeon: Hessie Knows, MD;  Location: [facility] ORS;  Service: Orthopedics;  Laterality: Right;     No outpatient medications have been marked as taking for the 07/28/19 encounter (Appointment) with Loel Dubonnet, NP.   Current Facility-Administered Medications for the 07/28/19 encounter (Appointment) with Loel Dubonnet, NP  Medication  .  oxyCODONE (Oxy IR/ROXICODONE) immediate release tablet 5 mg     Allergies:   Hydrocodone-acetaminophen and Neurontin [gabapentin]   Social History   Tobacco Use  . Smoking status: Current Every Day Smoker    Packs/day: 1.00    Last attempt to quit: 06/26/2017    Years since quitting: 2.0  . Smokeless tobacco:  Never Used  Substance Use Topics  . Alcohol use: No  . Drug use: No     Family Hx: The patient's family history includes Breast cancer (age of onset: 86) in his sister; Cancer in his brother; Dementia in his sister; Heart attack in his mother; Prostate cancer in his father.  ROS:   Please see the history of present illness.    Review of Systems  Constitution: Positive for malaise/fatigue. Negative for chills and fever.  Cardiovascular: Positive for dyspnea on exertion. Negative for chest pain, irregular heartbeat, leg swelling, near-syncope, orthopnea, palpitations and syncope.  Respiratory: Negative for cough, shortness of breath and wheezing.   Gastrointestinal: Negative for melena, nausea and vomiting.  Genitourinary: Negative for hematuria.  Neurological: Negative for dizziness, light-headedness and weakness.   All other systems reviewed and are negative.  Labs/Other Tests and Data Reviewed:    EKG:  An ECG dated 07/26/19 was personally reviewed today and demonstrated:  rate controlled atrial fibrillation 82 bpm  Recent Labs: 07/26/2019: Hemoglobin 14.0; Platelets 163 07/27/2019: ALT 29; BUN 33; Creatinine, Ser 0.97; Magnesium 1.9; Potassium 4.4; Sodium 139; TSH 0.043   Recent Lipid Panel Lab Results  Component Value Date/Time   CHOL 137 06/17/2018 02:39 PM   TRIG 165 (H) 06/17/2018 02:39 PM   HDL 36 (L) 06/17/2018 02:39 PM   CHOLHDL 3.8 06/17/2018 02:39 PM   LDLCALC 75 06/17/2018 02:39 PM   LDLDIRECT 82.0 08/11/2018 08:17 AM    Wt Readings from Last 3 Encounters:  07/24/19 160 lb (72.6 kg)  07/24/19 160 lb (72.6 kg)  07/21/19 160 lb (72.6 kg)     Objective:    Vital Signs:  There were no vitals taken for this visit.   VITAL SIGNS:  reviewed GEN:  no acute distress EYES:  sclerae anicteric, EOMI - Extraocular Movements Intact RESPIRATORY:  normal respiratory effort, symmetric expansion NEURO:  alert and oriented x 3, no obvious focal deficit PSYCH:  normal  affect  ASSESSMENT & PLAN:    1. Atrial fibrillation - New onset 07/26/19 while hospitalized. Rate not elevated, self-converted. Possibly triggered by acute COVID19 infection and/or thyroid abnormality. TSH 0.043, T3 3, T4 1.35 suggestive of hyperthyroidism - he will follow up with his primary care regarding this. Denies palpitations. He was unaware of atrial fibrillation while hospitalized. Continue Atenolol 25mg  daily. Continue Eliquis 5mg  BID. A 90 day supply of Eliquis was provided. May benefit from outpatient echocardiogram, but will discuss at next office visit.   2. Long term current use of anticoagulation - Secondary to PAF. CHADS2VASC of at least 3 (age, HTN, CAD). Denies bleeding complications. Plan for CBC in 4 weeks at office visit for monitoring.   3. CAD - Stable with no anginal symptoms. No indication for ischemic evaluation at this time. Continue GDMT beta blocker and statin. No aspirin secondary to anticoagulation.  4. HTN - BP elevated today, likely due to present COVID19 infection. He is agreeable to monitor twice per week at home and bring log to next office visit. If remains above goal of 130/80 consider up titration of Lisinopril.   5. HLD - Lipid profile 08/2018 with LDL  85. Follows with his PCP. Continue Atorvastatin 40mg  daily.  6. Tobacco abuse - Smoking cessation encouraged. Recommend utilization of [id number].  7. DM2 - Newly noted A1c of 6.5 while hospitalized. Recommend he follow up with his PCP. Low carbohydrate diet recommended.  8. CKD II - Careful adjustment of diuretics and anti-hypertensive medication. Will check BMET at next office visit.   COVID-19 Education: The signs and symptoms of COVID-19 were discussed with the patient and how to seek care for testing (follow up with PCP or arrange E-visit).  The importance of social distancing was discussed today.  Time:   Today, I have spent 15 minutes with the patient with telehealth technology discussing the  above problems.     Medication Adjustments/Labs and Tests Ordered: Current medicines are reviewed at length with the patient today.  Concerns regarding medicines are outlined above.   Tests Ordered: No orders of the defined types were placed in this encounter.  Medication Changes: No orders of the defined types were placed in this encounter.  Follow Up: In person In Person in 4 week(s) with CBC/BMET. His positive COVID test was 07/24/19. As he was hospitalized, he must be 21 days from positive test to be seen in the office setting. He may be seen for in-office visit on or after 08/15/19.   Signed, Loel Dubonnet, NP  07/28/2019 12:13 PM     [facility]

## 2019-07-29 LAB — CULTURE, BLOOD (ROUTINE X 2)
Culture: NO GROWTH
Culture: NO GROWTH
Special Requests: ADEQUATE

## 2019-08-01 ENCOUNTER — Ambulatory Visit (INDEPENDENT_AMBULATORY_CARE_PROVIDER_SITE_OTHER): Payer: PPO | Admitting: Family Medicine

## 2019-08-01 ENCOUNTER — Encounter: Payer: Self-pay | Admitting: Family Medicine

## 2019-08-01 ENCOUNTER — Other Ambulatory Visit: Payer: Self-pay

## 2019-08-01 DIAGNOSIS — R7989 Other specified abnormal findings of blood chemistry: Secondary | ICD-10-CM | POA: Diagnosis not present

## 2019-08-01 DIAGNOSIS — I480 Paroxysmal atrial fibrillation: Secondary | ICD-10-CM

## 2019-08-01 DIAGNOSIS — F419 Anxiety disorder, unspecified: Secondary | ICD-10-CM | POA: Diagnosis not present

## 2019-08-01 DIAGNOSIS — U071 COVID-19: Secondary | ICD-10-CM

## 2019-08-01 DIAGNOSIS — I4891 Unspecified atrial fibrillation: Secondary | ICD-10-CM | POA: Insufficient documentation

## 2019-08-01 MED ORDER — ALPRAZOLAM 0.5 MG PO TABS: 0.5000 mg | ORAL_TABLET | Freq: Every day | ORAL | 0 refills | Status: DC

## 2019-08-01 MED ORDER — BENZONATATE 200 MG PO CAPS: 200.0000 mg | ORAL_CAPSULE | Freq: Two times a day (BID) | ORAL | 0 refills | Status: DC | PRN

## 2019-08-01 NOTE — Progress Notes (Signed)
Virtual Visit via video Note  This visit type was conducted due to national recommendations for restrictions regarding the COVID-19 pandemic (e.g. social distancing).  This format is felt to be most appropriate for this patient at this time.  All issues noted in this document were discussed and addressed.  No physical exam was performed (except for noted visual exam findings with Video Visits).   I connected with Karl Myers today at 11:30 AM EST by a video enabled telemedicine application or telephone and verified that I am speaking with the correct person using two identifiers. Location patient: home Location provider: work  Persons participating in the virtual visit: patient, provider, Danil Depena (wife)  I discussed the limitations, risks, security and privacy concerns of performing an evaluation and management service by telephone and the availability of in person appointments. I also discussed with the patient that there may be a patient responsible charge related to this service. The patient expressed understanding and agreed to proceed.  Reason for visit: follow-up  HPI: Patient was hospitalized from 07/24/2019-07/27/2019 for COVID-19 infection.  He presented to the ED with cough, dyspnea, and confusion.  He was hypotensive and responded to IV fluids.  He was admitted to [facility] on remdesivir and steroids.  He progressively improved.  He was subsequently found to have atrial fibrillation briefly.  He was started on anticoagulation with Eliquis.  He subsequently requested discharge and was felt to be stable and was discharged home to return for his final infusion of remdesivir and to complete his oral Decadron as an outpatient.  He is to be on airborne and contact precautions for 21 days from positive testing.  He has noted no palpitations.  He has continued on home atenolol.  No bleeding issues.  He is drinking lots of fluids.  His TSH was found to be low though in the setting of his  illness no intervention was completed.  He notes he is progressively improving.  Minimal cough.  No shortness of breath.  No fevers.  He does not have any taste or smell.  His anxiety was bad in the hospital though has progressively improved.  He is taking a daytime dose of his Xanax and it does make him drowsy.  Typically only takes it at night.  No depression.  Discharge summary reviewed.  Medications reviewed.   ROS: See pertinent positives and negatives per HPI.  Past Medical History:  Diagnosis Date  . Arthritis   . COPD (chronic obstructive pulmonary disease) ([place])   . DDD (degenerative disc disease), cervical   . DDD (degenerative disc disease), cervical   . Depression   . Hypertension   . Pre-diabetes     Past Surgical History:  Procedure Laterality Date  . BACK SURGERY     x 3  . CARPAL TUNNEL RELEASE Right 08/19/2017   Procedure: CARPAL TUNNEL RELEASE;  Surgeon: Hessie Knows, MD;  Location: [facility] ORS;  Service: Orthopedics;  Laterality: Right;  . CATARACT EXTRACTION W/PHACO Right 11/17/2016   Procedure: CATARACT EXTRACTION PHACO AND INTRAOCULAR LENS PLACEMENT (IOC);  Surgeon: Birder Robson, MD;  Location: [facility] ORS;  Service: Ophthalmology;  Laterality: Right;  [place] 00:37 AP% 12.6 CDE 4.69 Fluid pack lot # [id number]  . CATARACT EXTRACTION W/PHACO Left 12/08/2016   Procedure: CATARACT EXTRACTION PHACO AND INTRAOCULAR LENS PLACEMENT (IOC);  Surgeon: Birder Robson, MD;  Location: [facility] ORS;  Service: Ophthalmology;  Laterality: Left;  [place] 00:33 AP% 13.9 CDE 4.63 Fluid pack lot # [id number]  . ULNAR  TUNNEL RELEASE Right 08/19/2017   Procedure: CUBITAL TUNNEL RELEASE;  Surgeon: Hessie Knows, MD;  Location: [facility] ORS;  Service: Orthopedics;  Laterality: Right;    Family History  Problem Relation Age of Onset  . Heart attack Mother   . Prostate cancer Father   . Breast cancer Sister 71  . Dementia Sister   . Cancer Brother        unknown what kind    SOCIAL HX:  Smoker   Current Outpatient Medications:  .  ALPRAZolam (XANAX) 0.5 MG tablet, TAKE ONE-HALF TABLET BY MOUTH EVERY MORNING AND TAKE ONE TABLET BY MOUTH AT BEDTIME AS DIRECTED (Patient taking differently: Take 0.5 mg by mouth at bedtime. ), Disp: 45 tablet, Rfl: 1 .  apixaban (ELIQUIS) 5 MG TABS tablet, Take 1 tablet (5 mg total) by mouth 2 (two) times daily., Disp: 90 tablet, Rfl: 1 .  atenolol (TENORMIN) 25 MG tablet, TAKE ONE TABLET AT BEDTIME, Disp: 90 tablet, Rfl: 3 .  atorvastatin (LIPITOR) 40 MG tablet, TAKE ONE TABLET BY MOUTH EVERY DAY (Patient taking differently: Take 40 mg by mouth at bedtime. ), Disp: 90 tablet, Rfl: 3 .  benzonatate (TESSALON) 200 MG capsule, Take 1 capsule (200 mg total) by mouth 2 (two) times daily as needed for cough., Disp: 20 capsule, Rfl: 0 .  cholecalciferol (VITAMIN D3) 25 MCG (1000 UNIT) tablet, Take 1,000 Units by mouth daily., Disp: , Rfl:  .  dexamethasone (DECADRON) 6 MG tablet, Take 1 tablet (6 mg total) by mouth daily for 5 days., Disp: 5 tablet, Rfl: 0 .  lisinopril (ZESTRIL) 10 MG tablet, TAKE ONE TABLET AT BEDTIME, Disp: 90 tablet, Rfl: 3 .  nitroGLYCERIN (NITROSTAT) 0.4 MG SL tablet, Place 1 tablet (0.4 mg total) under the tongue every 5 (five) minutes as needed for chest pain. For up to 3 doses per episode., Disp: 50 tablet, Rfl: 0 .  tiZANidine (ZANAFLEX) 4 MG tablet, Take 1 tablet (4 mg total) by mouth every 6 (six) hours as needed for muscle spasms., Disp: 30 tablet, Rfl: 0 .  traZODone (DESYREL) 150 MG tablet, TAKE ONE TABLET AT BEDTIME, Disp: 90 tablet, Rfl: 1 .  vitamin C (ASCORBIC ACID) 250 MG tablet, Take 250 mg by mouth daily., Disp: , Rfl:  .  Zinc 100 MG TABS, Take 100 mg by mouth daily., Disp: , Rfl:   Current Facility-Administered Medications:  .  oxyCODONE (Oxy IR/ROXICODONE) immediate release tablet 5 mg, 5 mg, Oral, Q3H PRN, Hessie Knows, MD  EXAM:  VITALS per patient if applicable:  GENERAL: alert, oriented, appears well and  in no acute distress  HEENT: atraumatic, conjunttiva clear, no obvious abnormalities on inspection of external nose and ears  NECK: normal movements of the head and neck  LUNGS: on inspection no signs of respiratory distress, breathing rate appears normal, no obvious gross SOB, gasping or wheezing  CV: no obvious cyanosis  MS: moves all visible extremities without noticeable abnormality  PSYCH/NEURO: pleasant and cooperative, no obvious depression or anxiety, speech and thought processing grossly intact  ASSESSMENT AND PLAN:  Discussed the following assessment and plan:  COVID-19 Patient is progressively recovering.  He will complete his course of Decadron.  Discussed that his taste and smell will be the last things to improve.  Discussed that he will likely return to baseline over the next several weeks though there is the potential that he could have some longer-term effects and we will monitor for those.  Advised that if he develops  any worsening symptoms he should seek medical attention.  Atrial fibrillation [facility]) He is doing well on his current medication.  He will continue Eliquis.  He will continue to see cardiology.  Low TSH level Low on lab work in the hospital.  Could be related to his recent illness.  Plan on rechecking in 3 to 4 weeks.  Orders placed.  Anxiety Improved since being home.  Advised that he should stop the daytime dose given that it makes him drowsy.  He will monitor.   No orders of the defined types were placed in this encounter.   No orders of the defined types were placed in this encounter.    I discussed the assessment and treatment plan with the patient. The patient was provided an opportunity to ask questions and all were answered. The patient agreed with the plan and demonstrated an understanding of the instructions.   The patient was advised to call back or seek an in-person evaluation if the symptoms worsen or if the condition fails to improve  as anticipated.   Tommi Rumps, MD

## 2019-08-01 NOTE — Assessment & Plan Note (Signed)
Low on lab work in the hospital.  Could be related to his recent illness.  Plan on rechecking in 3 to 4 weeks.  Orders placed.

## 2019-08-01 NOTE — Assessment & Plan Note (Signed)
Improved since being home.  Advised that he should stop the daytime dose given that it makes him drowsy.  He will monitor.

## 2019-08-01 NOTE — Assessment & Plan Note (Signed)
He is doing well on his current medication.  He will continue Eliquis.  He will continue to see cardiology.

## 2019-08-01 NOTE — Assessment & Plan Note (Signed)
Patient is progressively recovering.  He will complete his course of Decadron.  Discussed that his taste and smell will be the last things to improve.  Discussed that he will likely return to baseline over the next several weeks though there is the potential that he could have some longer-term effects and we will monitor for those.  Advised that if he develops any worsening symptoms he should seek medical attention.

## 2019-08-02 ENCOUNTER — Telehealth: Payer: Self-pay

## 2019-08-03 NOTE — Telephone Encounter (Signed)
Patient's wife called to let office know he smokes about 1 pack a day.

## 2019-08-03 NOTE — Telephone Encounter (Signed)
Please find out how many packs per day he is smoking and then I can send in the appropriate patches.

## 2019-08-03 NOTE — Telephone Encounter (Signed)
Patient's wife called to let office know he smokes about 1 pack a day.  Ronnell Makarewicz,cma

## 2019-08-04 MED ORDER — NICOTINE 21 MG/24HR TD PT24: 21.0000 mg | MEDICATED_PATCH | Freq: Every day | TRANSDERMAL | 0 refills | Status: DC

## 2019-08-04 NOTE — Telephone Encounter (Signed)
For staff nicotine patches sent to the pharmacy.  Once he gets to about a week left he needs to contact us for the second step patches which will be 14 mg patches for 2 weeks.  Followed by 7 mg patches for 2 weeks.

## 2019-08-04 NOTE — Telephone Encounter (Signed)
I called and spoke with the patient and informed him that his nicotine patches were sent to the pharmacy and after a week he needs to call us so we can send the second step, patient understood.  Araseli Sherry,cma

## 2019-08-08 ENCOUNTER — Other Ambulatory Visit: Payer: Self-pay

## 2019-08-08 ENCOUNTER — Telehealth: Payer: Self-pay | Admitting: Family Medicine

## 2019-08-08 ENCOUNTER — Ambulatory Visit: Payer: PPO | Attending: Family Medicine

## 2019-08-08 NOTE — Telephone Encounter (Signed)
I called and spoke with the patient and informed him to stay hydrated. I also informed him that per the provider if his BP drops less that 90/60 to let us know and he will consider decreasing his BP medication.  As far as his covid19- symptoms he is still experiencing I informed him that the provider stated for him to stay inside away form others until his symptoms improve.  The patient understood.  Karl Myers,cma

## 2019-08-08 NOTE — Telephone Encounter (Signed)
Pt wife called and said that pt BP yesterday were 6am 137/55 7:30am 127/73 5:45pm 115/65. Today I was   97/56 at 7am. Pt stated that the hair on his head hurt and that he had a low grade fever 98.7. headaches, no taste, no smell BP is all over the place. I gave the patient the number he wants to be retested.  I informed him I will call him back about BP once I speak with provider.  Ciella Obi,cma

## 2019-08-08 NOTE — Telephone Encounter (Signed)
Pt wife called and said that pt BP yesterday were 6am 137/55 7:30am 127/73 5:45pm 115/65. Today I was   97/56 at 7am. Pt stated that the hair on his head hurt and that he had a low grade fever

## 2019-08-08 NOTE — Telephone Encounter (Signed)
There is no reason for him to be retested within 90 days of his initial positive test. He will test positive again. 98.7 is a normal temperature. His taste and smell have been issues since his initial infection. His BP appears to be borderline low. Is he having any light headedness? Any chest pain or shortness of breath? How much fluid is he taking in? Is he taking his lisinopril and atenolol?

## 2019-08-08 NOTE — Telephone Encounter (Signed)
Noted.  I would suggest he try to hydrate adequately.  He should continue to monitor his blood pressure.  If it is dropping less than 90/60 or he starts to develop lightheadedness then we need to think about decreasing one of his blood pressure medicines.  I would suggest that he avoid going out and being around anyone at this time given that he still has some symptoms.  He should wait until his symptoms start to improve.

## 2019-08-08 NOTE — Telephone Encounter (Signed)
I called and spoke with the patient and he stated he did have one episode of lightheadedness when getting out of the shower this morning, he states he is not having any SOB, or chest pain, He has drank 1/2 gallon of orange juice this morning and he takes his Atenolol and lisinopril at night and he took it last night.  He was taken off quarantine yesterday and wants to know if it is safe to see his sister or neighbors with theses symptoms going on.  Jasmin Trumbull,cma

## 2019-08-21 NOTE — Progress Notes (Signed)
Cardiology Office Note  Date:  08/23/2019   ID:  Karl Myers, Karl Myers 1946/03/22, MRN [id number]  PCP:  Karl Haven, MD   Chief Complaint  Patient presents with  . OTHER    Afib. Meds reviewed verbally with pt.    HPI:  [name] is a 74 year old gentleman with past medical history of HTN Smoker, quit last month.  Smoked for 50 years or more Anxiety Coronary artery disease on previous CT scan Fatigue, with exertion, Who presents for f/u of his fatigue, coronary artery disease  covid 07/2019 Records reviewed bilateral multifocal infiltrates and hypoxia  remdesivir and steroids.  brief atrial fibrillation  Anticoagulation was started for stroke risk reduction and will continue with eliquis. 5th and final infusion of remdesivir on 2/19 and complete a course of oral decadron as outpatient. HbA1c 6.5%.  Lost weight, 8 pounds  Started smoking again 1 ppd Does not want chantix  Stress test 07/30/2017, reviewed with him in detail  There was no ST segment deviation noted during stress.  The study is normal.  This is a low risk study.  The left ventricular ejection fraction is normal (55-65%).  works in the garden [employer]  Previous lab work reviewed with him in detail Total chol 137, LDL 82 HBA1C 6.0  EKG personally reviewed by myself on todays visit Shows normal sinus rhythm rate 74 bpm no significant ST or T wave changes   PMH:   has a past medical history of Actinic keratosis, Arthritis, COPD (chronic obstructive pulmonary disease) ([place]), DDD (degenerative disc disease), cervical, DDD (degenerative disc disease), cervical, Depression, Hypertension, and Pre-diabetes.  PSH:    Past Surgical History:  Procedure Laterality Date  . BACK SURGERY     x 3  . CARPAL TUNNEL RELEASE Right 08/19/2017   Procedure: CARPAL TUNNEL RELEASE;  Surgeon: Hessie Knows, MD;  Location: [facility] ORS;  Service: Orthopedics;  Laterality: Right;  . CATARACT EXTRACTION  W/PHACO Right 11/17/2016   Procedure: CATARACT EXTRACTION PHACO AND INTRAOCULAR LENS PLACEMENT (IOC);  Surgeon: Birder Robson, MD;  Location: [facility] ORS;  Service: Ophthalmology;  Laterality: Right;  [place] 00:37 AP% 12.6 CDE 4.69 Fluid pack lot # [id number]  . CATARACT EXTRACTION W/PHACO Left 12/08/2016   Procedure: CATARACT EXTRACTION PHACO AND INTRAOCULAR LENS PLACEMENT (IOC);  Surgeon: Birder Robson, MD;  Location: [facility] ORS;  Service: Ophthalmology;  Laterality: Left;  [place] 00:33 AP% 13.9 CDE 4.63 Fluid pack lot # [id number]  . ULNAR TUNNEL RELEASE Right 08/19/2017   Procedure: CUBITAL TUNNEL RELEASE;  Surgeon: Hessie Knows, MD;  Location: [facility] ORS;  Service: Orthopedics;  Laterality: Right;    Current Outpatient Medications  Medication Sig Dispense Refill  . ALPRAZolam (XANAX) 0.5 MG tablet Take 1 tablet (0.5 mg total) by mouth at bedtime. 45 tablet 0  . apixaban (ELIQUIS) 5 MG TABS tablet Take 1 tablet (5 mg total) by mouth 2 (two) times daily. 90 tablet 1  . atenolol (TENORMIN) 25 MG tablet TAKE ONE TABLET AT BEDTIME 90 tablet 3  . atorvastatin (LIPITOR) 40 MG tablet TAKE ONE TABLET BY MOUTH EVERY DAY (Patient taking differently: Take 40 mg by mouth at bedtime. ) 90 tablet 3  . benzonatate (TESSALON) 200 MG capsule Take 1 capsule (200 mg total) by mouth 2 (two) times daily as needed for cough. 20 capsule 0  . cholecalciferol (VITAMIN D3) 25 MCG (1000 UNIT) tablet Take 1,000 Units by mouth daily.    Marland Kitchen lisinopril (ZESTRIL) 10 MG tablet TAKE ONE TABLET  AT BEDTIME 90 tablet 3  . nicotine (NICODERM CQ - DOSED IN MG/24 HOURS) 21 mg/24hr patch Place 1 patch (21 mg total) onto the skin daily. For 6 weeks. Call for next step once on the last week. 42 patch 0  . nitroGLYCERIN (NITROSTAT) 0.4 MG SL tablet Place 1 tablet (0.4 mg total) under the tongue every 5 (five) minutes as needed for chest pain. For up to 3 doses per episode. 50 tablet 0  . tiZANidine (ZANAFLEX) 4 MG tablet Take 1 tablet (4 mg  total) by mouth every 6 (six) hours as needed for muscle spasms. 30 tablet 0  . traZODone (DESYREL) 150 MG tablet TAKE ONE TABLET AT BEDTIME 90 tablet 1  . vitamin C (ASCORBIC ACID) 250 MG tablet Take 250 mg by mouth daily.    . Zinc 100 MG TABS Take 100 mg by mouth daily.     Current Facility-Administered Medications  Medication Dose Route Frequency Provider Last Rate Last Admin  . oxyCODONE (Oxy IR/ROXICODONE) immediate release tablet 5 mg  5 mg Oral Q3H PRN Hessie Knows, MD         Allergies:   Hydrocodone-acetaminophen and Neurontin [gabapentin]   Social History:  The patient  reports that he has been smoking. He has been smoking about 1.00 pack per day. He has never used smokeless tobacco. He reports that he does not drink alcohol or use drugs.   Family History:   family history includes Breast cancer (age of onset: 28) in his sister; Cancer in his brother; Dementia in his sister; Heart attack in his mother; Prostate cancer in his father.    Review of Systems: Review of Systems  Constitutional: Positive for malaise/fatigue.  HENT: Negative.   Respiratory: Positive for shortness of breath.   Cardiovascular: Negative.   Gastrointestinal: Negative.   Musculoskeletal: Negative.   Neurological: Negative.   Psychiatric/Behavioral: Negative.   All other systems reviewed and are negative.   PHYSICAL EXAM: VS:  BP 100/60 (BP Location: Left Arm, Patient Position: Sitting, Cuff Size: Normal)   Pulse 74   Ht 5\' 8"  (1.727 m)   Wt 165 lb (74.8 kg)   SpO2 98%   BMI 25.09 kg/m  , BMI Body mass index is 25.09 kg/m. Constitutional:  oriented to person, place, and time. No distress.  HENT:  Head: Grossly normal Eyes:  no discharge. No scleral icterus.  Neck: No JVD, no carotid bruits  Cardiovascular: Regular rate and rhythm, no murmurs appreciated Pulmonary/Chest: Clear to auscultation bilaterally, no wheezes or rails Abdominal: Soft.  no distension.  no tenderness.   Musculoskeletal: Normal range of motion Neurological:  normal muscle tone. Coordination normal. No atrophy Skin: Skin warm and dry Psychiatric: normal affect, pleasant  Recent Labs: 07/26/2019: Hemoglobin 14.0; Platelets 163 07/27/2019: ALT 29; BUN 33; Creatinine, Ser 0.97; Magnesium 1.9; Potassium 4.4; Sodium 139; TSH 0.043    Lipid Panel Lab Results  Component Value Date   CHOL 137 06/17/2018   HDL 36 (L) 06/17/2018   LDLCALC 75 06/17/2018   TRIG 165 (H) 06/17/2018      Wt Readings from Last 3 Encounters:  08/23/19 165 lb (74.8 kg)  08/01/19 166 lb (75.3 kg)  07/28/19 166 lb (75.3 kg)      ASSESSMENT AND PLAN:  Coronary artery disease of native artery of native heart with stable angina pectoris (HCC) Significant coronary disease seen on CT scan 50 years of smoking, continues to smoke smoking cessation recommended  Chronic fatigue -  Prior stress  test with no ischemia 2019 walking program. Getting over covid  Tobacco abuse We have encouraged him to continue to work on weaning his cigarettes and smoking cessation. He will continue to work on this and does not want any assistance with chantix.   Essential hypertension - Plan: EKG 12-Lead Blood pressure is well controlled on today's visit. No changes made to the medications.  Mixed hyperlipidemia -  Cholesterol is at goal on the current lipid regimen. No changes to the medications were made.   Disposition:   F/U 12 months   Total encounter time more than 25 minutes  Greater than 50% was spent in counseling and coordination of care with the patient    Orders Placed This Encounter  Procedures  . EKG 12-Lead     Signed, Esmond Plants, M.D., Ph.D. 08/23/2019  [place], [place]

## 2019-08-22 ENCOUNTER — Other Ambulatory Visit: Payer: Self-pay

## 2019-08-23 ENCOUNTER — Other Ambulatory Visit: Payer: Self-pay

## 2019-08-23 ENCOUNTER — Ambulatory Visit: Payer: PPO | Admitting: Cardiovascular Disease

## 2019-08-23 ENCOUNTER — Encounter: Payer: Self-pay | Admitting: Cardiovascular Disease

## 2019-08-23 VITALS — BP 100/60 | HR 74 | Ht 68.0 in | Wt 165.0 lb

## 2019-08-23 DIAGNOSIS — Z720 Tobacco use: Secondary | ICD-10-CM

## 2019-08-23 DIAGNOSIS — I480 Paroxysmal atrial fibrillation: Secondary | ICD-10-CM | POA: Diagnosis not present

## 2019-08-23 DIAGNOSIS — R5382 Chronic fatigue, unspecified: Secondary | ICD-10-CM

## 2019-08-23 DIAGNOSIS — E782 Mixed hyperlipidemia: Secondary | ICD-10-CM

## 2019-08-23 DIAGNOSIS — I10 Essential (primary) hypertension: Secondary | ICD-10-CM

## 2019-08-23 DIAGNOSIS — I25118 Atherosclerotic heart disease of native coronary artery with other forms of angina pectoris: Secondary | ICD-10-CM

## 2019-08-23 NOTE — Patient Instructions (Addendum)
Medication Instructions:  Only take lisinopril for pressure >110 Move to the morning  If you need a refill on your cardiac medications before your next appointment, please call your pharmacy.    Lab work: No new labs needed   If you have labs (blood work) drawn today and your tests are completely normal, you will receive your results only by: Marland Kitchen MyChart Message (if you have MyChart) OR . A paper copy in the mail If you have any lab test that is abnormal or we need to change your treatment, we will call you to review the results.   Testing/Procedures: No new testing needed   Follow-Up: At [facility], you and your health needs are our priority.  As part of our continuing mission to provide you with exceptional heart care, we have created designated Provider Care Teams.  These Care Teams include your primary Cardiologist (physician) and Advanced Practice Providers (APPs -  Physician Assistants and Nurse Practitioners) who all work together to provide you with the care you need, when you need it.  . You will need a follow up appointment in 12 months   . Providers on your designated Care Team:   . Murray Hodgkins, NP . Christell Faith, PA-C . Marrianne Mood, PA-C  Any Other Special Instructions Will Be Listed Below (If Applicable).  For educational health videos Log in to : [email or web address] Or : [email or web address], password : [email or web address]

## 2019-09-01 ENCOUNTER — Ambulatory Visit: Payer: PPO | Admitting: Family Medicine

## 2019-09-04 ENCOUNTER — Ambulatory Visit: Payer: PPO | Admitting: Dermatology

## 2019-09-04 ENCOUNTER — Other Ambulatory Visit: Payer: Self-pay

## 2019-09-04 DIAGNOSIS — L820 Inflamed seborrheic keratosis: Secondary | ICD-10-CM

## 2019-09-04 DIAGNOSIS — L570 Actinic keratosis: Secondary | ICD-10-CM

## 2019-09-04 DIAGNOSIS — L578 Other skin changes due to chronic exposure to nonionizing radiation: Secondary | ICD-10-CM

## 2019-09-04 DIAGNOSIS — D485 Neoplasm of uncertain behavior of skin: Secondary | ICD-10-CM

## 2019-09-04 DIAGNOSIS — C439 Malignant melanoma of skin, unspecified: Secondary | ICD-10-CM

## 2019-09-04 DIAGNOSIS — C4371 Malignant melanoma of right lower limb, including hip: Secondary | ICD-10-CM

## 2019-09-04 DIAGNOSIS — D692 Other nonthrombocytopenic purpura: Secondary | ICD-10-CM

## 2019-09-04 HISTORY — DX: Malignant melanoma of skin, unspecified: C43.9

## 2019-09-04 NOTE — Patient Instructions (Addendum)
Wound Care Instructions  1. Cleanse wound gently with soap and water once a day then pat dry with clean gauze. Apply a thing coat of Petrolatum (petroleum jelly, "Vaseline") over the wound (unless you have an allergy to this). We recommend that you use a new, sterile tube of Vaseline. Do not pick or remove scabs. Do not remove the yellow or white "healing tissue" from the base of the wound.  2. Cover the wound with fresh, clean, nonstick gauze and secure with paper tape. You may use Band-Aids in place of gauze and tape if the would is small enough, but would recommend trimming much of the tape off as there is often too much. Sometimes Band-Aids can irritate the skin.  3. You should call the office for your biopsy report after 1 week if you have not already been contacted.  4. If you experience any problems, such as abnormal amounts of bleeding, swelling, significant bruising, significant pain, or evidence of infection, please call the office immediately.  FOR ADULT SURGERY PATIENTS: If you need something for pain relief you may take 1 extra strength Tylenol (acetaminophen) AND 2 Ibuprofen (200mg  each) together every 4 hours as needed for pain. (do not take these if you are allergic to them or if you have a reason you should not take them.) Typically, you may only need pain medication for 1 to 3 days.    Recommend daily broad spectrum sunscreen SPF 30+ to sun-exposed areas, reapply every 2 hours as needed. Call for new or changing lesions.

## 2019-09-04 NOTE — Progress Notes (Signed)
   Follow-Up Visit   Subjective  Karl Myers is a 74 y.o. male who presents for the following: Follow-up (AK's x 12 at face and ears. Remaining spot on R ear. 10 week follow up.) and spot (R underside R small toe. Using salicylic pads but no improvement. ).   The following portions of the chart were reviewed this encounter and updated as appropriate:     Review of Systems: No other skin or systemic complaints.  Objective  Well appearing patient in no apparent distress; mood and affect are within normal limits.  A focused examination was performed including face, scalp, hands, feet. Relevant physical exam findings are noted in the Assessment and Plan.  Objective  face, scalp, ears (19): Erythematous thin papules/macules with gritty scale.   Objective  face, hands, scalp: Diffuse scaly erythematous macules with underlying dyspigmentation.   Objective  face, scalp, ears (10): Erythematous keratotic or waxy stuck-on papule or plaque.   Objective  Right 5th toe lateral tip: 0.9cm dyspigmented patch       Objective  hands: Violaceous macules and patches.   Assessment & Plan  AK (actinic keratosis) (19) face, scalp, ears  Destruction of lesion - face, scalp, ears Complexity: simple   Destruction method: cryotherapy   Informed consent: discussed and consent obtained   Timeout:  patient name, date of birth, surgical site, and procedure verified Lesion destroyed using liquid nitrogen: Yes   Region frozen until ice ball extended beyond lesion: Yes   Outcome: patient tolerated procedure well with no complications   Post-procedure details: wound care instructions given    Actinic skin damage face, hands, scalp  Recommend daily broad spectrum sunscreen SPF 30+ to sun-exposed areas, reapply every 2 hours as needed. Call for new or changing lesions.   Inflamed seborrheic keratosis (10) face, scalp, ears  Destruction of lesion - face, scalp, ears Complexity: simple     Destruction method: cryotherapy   Informed consent: discussed and consent obtained   Timeout:  patient name, date of birth, surgical site, and procedure verified Lesion destroyed using liquid nitrogen: Yes   Region frozen until ice ball extended beyond lesion: Yes   Outcome: patient tolerated procedure well with no complications   Post-procedure details: wound care instructions given    Neoplasm of uncertain behavior of skin Right 5th toe lateral tip  Skin / nail biopsy Type of biopsy: tangential   Informed consent: discussed and consent obtained   Timeout: patient name, date of birth, surgical site, and procedure verified   Procedure prep:  Patient was prepped and draped in usual sterile fashion Prep type:  Isopropyl alcohol Anesthesia: the lesion was anesthetized in a standard fashion   Anesthetic:  1% lidocaine w/ epinephrine 1-100,000 buffered w/ 8.4% NaHCO3 Instrument used: flexible razor blade   Hemostasis achieved with: pressure, aluminum chloride and electrodesiccation   Outcome: patient tolerated procedure well   Post-procedure details: sterile dressing applied and wound care instructions given   Dressing type: petrolatum and bandage    Specimen 1 - Surgical pathology Differential Diagnosis: Callous with purpura vs other r/o CA Check Margins: No 0.9cm dyspigmented patch 2 specimens  Senile purpura (HCC) hands  Benign, observe.    Return for 6 weeks PDT to scalp, 8weeks PDT to face, follow up with Dr. Raliegh Ip in 14 weeks.   Graciella Belton, RMA, am acting as scribe for [name], MD .

## 2019-09-05 ENCOUNTER — Telehealth: Payer: Self-pay | Admitting: Cardiovascular Disease

## 2019-09-05 DIAGNOSIS — M48062 Spinal stenosis, lumbar region with neurogenic claudication: Secondary | ICD-10-CM | POA: Diagnosis not present

## 2019-09-05 DIAGNOSIS — M5030 Other cervical disc degeneration, unspecified cervical region: Secondary | ICD-10-CM | POA: Diagnosis not present

## 2019-09-05 DIAGNOSIS — M47812 Spondylosis without myelopathy or radiculopathy, cervical region: Secondary | ICD-10-CM | POA: Diagnosis not present

## 2019-09-05 DIAGNOSIS — M5416 Radiculopathy, lumbar region: Secondary | ICD-10-CM | POA: Diagnosis not present

## 2019-09-05 DIAGNOSIS — M5136 Other intervertebral disc degeneration, lumbar region: Secondary | ICD-10-CM | POA: Diagnosis not present

## 2019-09-05 DIAGNOSIS — M5412 Radiculopathy, cervical region: Secondary | ICD-10-CM | POA: Diagnosis not present

## 2019-09-05 NOTE — Telephone Encounter (Signed)
Patient with diagnosis of afib on Eliquis for anticoagulation.    Procedure: Cervical steroid injection  Date of procedure: 10/04/19  CHADS2-VASc score of  3 ( HTN, AGE, CAD)  CrCl 70 ml/min  Per office protocol, patient can hold Eliquis for 3 days prior to procedure.

## 2019-09-05 NOTE — Telephone Encounter (Signed)
Will route to pharmacy for recommendations.

## 2019-09-05 NOTE — Telephone Encounter (Signed)
   Primary Cardiologist: Ida Rogue, MD  Chart reviewed as part of pre-operative protocol coverage. Given past medical history and time since last visit, based on ACC/AHA guidelines, Karl Myers would be at acceptable risk for the planned procedure without further cardiovascular testing. Seen last by Dr. Rockey Situ on 08/23/2019. Stable. Pharmacy recommendations as below:  Patient with diagnosis of afib on Eliquis for anticoagulation.    Procedure: Cervical steroid injection Date of procedure: 10/04/19  CHADS2-VASc score of  3 ( HTN, AGE, CAD)  CrCl 70 ml/min   I will route this recommendation to the requesting party via [facility] fax function and remove from pre-op pool.  Please call with questions.  [name]. West Pugh, ANP, AACC  09/05/2019, 4:34 PM

## 2019-09-05 NOTE — Telephone Encounter (Signed)
   [name] Pre-operative Risk Assessment    Request for surgical clearance:  1. What type of surgery is being performed?  Cervical steroid injection   2. When is this surgery scheduled? 10/04/19   3. What type of clearance is required (medical clearance vs. Pharmacy clearance to hold med vs. Both)? both  4. Are there any medications that need to be held prior to surgery and how long? Needs to hold Eliquis 3 days prior to procedure   5. Practice name and name of physician performing surgery? [facility]  6. What is your office phone number [phone]   7.   What is your office fax number [phone]  8.   Anesthesia type (None, local, MAC, general) ? Not listed    Ace Gins 09/05/2019, 3:52 PM  _________________________________________________________________   (provider comments below)

## 2019-09-07 ENCOUNTER — Ambulatory Visit: Payer: PPO | Admitting: Dermatology

## 2019-09-07 ENCOUNTER — Other Ambulatory Visit: Payer: Self-pay

## 2019-09-07 DIAGNOSIS — C4371 Malignant melanoma of right lower limb, including hip: Secondary | ICD-10-CM | POA: Diagnosis not present

## 2019-09-07 DIAGNOSIS — C439 Malignant melanoma of skin, unspecified: Secondary | ICD-10-CM

## 2019-09-07 NOTE — Progress Notes (Signed)
   Follow-Up Visit   Subjective  Karl Myers is a 74 y.o. 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   The following portions of the chart were reviewed this encounter and updated as appropriate:     Review of Systems: No other skin or systemic complaints.  Objective  Well appearing patient in no apparent distress; mood and affect are within normal limits.  A focused examination was performed including R 5th toe, lymph nodes lower leg. Relevant physical exam findings are noted in the Assessment and Plan.  Objective  R 5th toe lat tip: Healing bx site.  No lymphadenopathy of ankle or popliteal area or groin on Right side of body.  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  Will refer pt to [facility] for treatment. Advised patient that [facility] doctors (Dr Johnnette Litter et al) will determine the appropriate procedures and treatment, but it may include Lymphoscintigraphy and Sentinel Lymph node biopsy and Wide Local Excision.  Discussed in detail.   Bx site cleaned with puracyn, mupirocin applied and bandage.  Other Related Procedures Ambulatory referral to [facility].  Return as scheduled for PDT and f/u with Dr. Nehemiah Massed.   I, [name], RMA, am acting as scribe for [name], MD .

## 2019-09-14 DIAGNOSIS — C439 Malignant melanoma of skin, unspecified: Secondary | ICD-10-CM | POA: Diagnosis not present

## 2019-09-18 DIAGNOSIS — I4891 Unspecified atrial fibrillation: Secondary | ICD-10-CM | POA: Diagnosis not present

## 2019-09-18 DIAGNOSIS — C439 Malignant melanoma of skin, unspecified: Secondary | ICD-10-CM | POA: Diagnosis not present

## 2019-09-18 DIAGNOSIS — C774 Secondary and unspecified malignant neoplasm of inguinal and lower limb lymph nodes: Secondary | ICD-10-CM | POA: Diagnosis not present

## 2019-09-18 DIAGNOSIS — D485 Neoplasm of uncertain behavior of skin: Secondary | ICD-10-CM | POA: Diagnosis not present

## 2019-09-18 DIAGNOSIS — C4372 Malignant melanoma of left lower limb, including hip: Secondary | ICD-10-CM | POA: Diagnosis not present

## 2019-09-18 DIAGNOSIS — I10 Essential (primary) hypertension: Secondary | ICD-10-CM | POA: Diagnosis not present

## 2019-09-18 DIAGNOSIS — C4371 Malignant melanoma of right lower limb, including hip: Secondary | ICD-10-CM | POA: Diagnosis not present

## 2019-09-27 DIAGNOSIS — Z09 Encounter for follow-up examination after completed treatment for conditions other than malignant neoplasm: Secondary | ICD-10-CM | POA: Diagnosis not present

## 2019-09-27 DIAGNOSIS — C4371 Malignant melanoma of right lower limb, including hip: Secondary | ICD-10-CM | POA: Diagnosis not present

## 2019-10-04 DIAGNOSIS — M5412 Radiculopathy, cervical region: Secondary | ICD-10-CM | POA: Diagnosis not present

## 2019-10-04 DIAGNOSIS — M5030 Other cervical disc degeneration, unspecified cervical region: Secondary | ICD-10-CM | POA: Diagnosis not present

## 2019-10-13 ENCOUNTER — Ambulatory Visit (INDEPENDENT_AMBULATORY_CARE_PROVIDER_SITE_OTHER): Payer: PPO

## 2019-10-13 ENCOUNTER — Other Ambulatory Visit: Payer: Self-pay

## 2019-10-13 ENCOUNTER — Ambulatory Visit (INDEPENDENT_AMBULATORY_CARE_PROVIDER_SITE_OTHER): Payer: PPO | Admitting: Family Medicine

## 2019-10-13 ENCOUNTER — Encounter: Payer: Self-pay | Admitting: Family Medicine

## 2019-10-13 VITALS — BP 128/70 | HR 60 | Temp 96.0°F | Ht 68.0 in | Wt 168.4 lb

## 2019-10-13 DIAGNOSIS — R5382 Chronic fatigue, unspecified: Secondary | ICD-10-CM

## 2019-10-13 DIAGNOSIS — G479 Sleep disorder, unspecified: Secondary | ICD-10-CM | POA: Diagnosis not present

## 2019-10-13 DIAGNOSIS — Z125 Encounter for screening for malignant neoplasm of prostate: Secondary | ICD-10-CM

## 2019-10-13 DIAGNOSIS — C439 Malignant melanoma of skin, unspecified: Secondary | ICD-10-CM

## 2019-10-13 DIAGNOSIS — Z8042 Family history of malignant neoplasm of prostate: Secondary | ICD-10-CM

## 2019-10-13 DIAGNOSIS — I2510 Atherosclerotic heart disease of native coronary artery without angina pectoris: Secondary | ICD-10-CM

## 2019-10-13 DIAGNOSIS — I10 Essential (primary) hypertension: Secondary | ICD-10-CM

## 2019-10-13 DIAGNOSIS — R0602 Shortness of breath: Secondary | ICD-10-CM | POA: Diagnosis not present

## 2019-10-13 DIAGNOSIS — I480 Paroxysmal atrial fibrillation: Secondary | ICD-10-CM | POA: Diagnosis not present

## 2019-10-13 DIAGNOSIS — Z85820 Personal history of malignant melanoma of skin: Secondary | ICD-10-CM | POA: Insufficient documentation

## 2019-10-13 LAB — CBC
HCT: 45.2 % (ref 39.0–52.0)
Hemoglobin: 15.2 g/dL (ref 13.0–17.0)
MCHC: 33.6 g/dL (ref 30.0–36.0)
MCV: 99.8 fl (ref 78.0–100.0)
Platelets: 210 10*3/uL (ref 150.0–400.0)
RBC: 4.53 Mil/uL (ref 4.22–5.81)
RDW: 15 % (ref 11.5–15.5)
WBC: 10.1 10*3/uL (ref 4.0–10.5)

## 2019-10-13 LAB — T3, FREE: T3, Free: 3.9 pg/mL (ref 2.3–4.2)

## 2019-10-13 LAB — COMPREHENSIVE METABOLIC PANEL
ALT: 14 U/L (ref 0–53)
AST: 19 U/L (ref 0–37)
Albumin: 4.4 g/dL (ref 3.5–5.2)
Alkaline Phosphatase: 72 U/L (ref 39–117)
BUN: 26 mg/dL — ABNORMAL HIGH (ref 6–23)
CO2: 30 mEq/L (ref 19–32)
Calcium: 9.2 mg/dL (ref 8.4–10.5)
Chloride: 102 mEq/L (ref 96–112)
Creatinine, Ser: 1.11 mg/dL (ref 0.40–1.50)
GFR: 64.74 mL/min (ref >60.00)
Glucose, Bld: 111 mg/dL — ABNORMAL HIGH (ref 70–99)
Potassium: 4.3 mEq/L (ref 3.5–5.1)
Sodium: 138 mEq/L (ref 135–145)
Total Bilirubin: 0.7 mg/dL (ref 0.2–1.2)
Total Protein: 6.6 g/dL (ref 6.0–8.3)

## 2019-10-13 LAB — T4, FREE: Free T4: 1.08 ng/dL (ref 0.60–1.60)

## 2019-10-13 LAB — PSA, MEDICARE: PSA: 0.64 ng/ml (ref 0.10–4.00)

## 2019-10-13 LAB — TSH: TSH: 0.46 u[IU]/mL (ref 0.35–4.50)

## 2019-10-13 NOTE — Assessment & Plan Note (Signed)
PSA ordered

## 2019-10-13 NOTE — Patient Instructions (Signed)
Nice to see you. We will get labs today and contact you with the results. We will also get a chest x-ray.

## 2019-10-13 NOTE — Assessment & Plan Note (Signed)
Adequately controlled.  Continue current regimen. 

## 2019-10-13 NOTE — Progress Notes (Signed)
 , MD Phone: [phone]  Karl Myers is a 74 y.o. male who presents today for f/u.  HYPERTENSION/CAD/AFIB Disease Monitoring  Home BP Monitoring similar to today Chest pain- no    Dyspnea- chronic post COVID Medications  Compliance-  Taking eliquis, atenolol, lisinopril, lipitor.  Edema- no No palpitations or Bleeding.  Sleep difficulty/anxiety: Patient notes no anxiety or depression.  He does have difficulty sleeping and notes he wakes up at times around 2 AM and cannot go back to sleep.  He does take Xanax at bedtime to help him sleep.  No drowsiness with this.  No alcohol intake.  Melanoma: Recently underwent right fifth toe amputation for melanoma.  He reports no lymph node involvement.  He follows up with dermatology next week.  He does report mild serous drainage from the wound.    Social History   Tobacco Use  Smoking Status Current Every Day Smoker  . Packs/day: 1.00  . Last attempt to quit: 06/26/2017  . Years since quitting: 2.2  Smokeless Tobacco Never Used     ROS see history of present illness  Objective  Physical Exam Vitals:   10/13/19 0910  BP: 128/70  Pulse: 60  Temp: (!) 96 F (35.6 C)  SpO2: 96%    BP Readings from Last 3 Encounters:  10/13/19 128/70  08/23/19 100/60  07/28/19 (!) 141/81   Wt Readings from Last 3 Encounters:  10/13/19 168 lb 6.4 oz (76.4 kg)  08/23/19 165 lb (74.8 kg)  08/01/19 166 lb (75.3 kg)    Physical Exam Constitutional:      General: He is not in acute distress.    Appearance: He is not diaphoretic.  Cardiovascular:     Rate and Rhythm: Normal rate and regular rhythm.     Heart sounds: Normal heart sounds.  Pulmonary:     Effort: Pulmonary effort is normal.     Breath sounds: Normal breath sounds.  Musculoskeletal:     Right lower leg: No edema.     Left lower leg: No edema.  Skin:    General: Skin is warm and dry.  Neurological:     Mental Status: He is alert.    No tenderness,  no erythema, no apparent drainage   Assessment/Plan: Please see individual problem list.  Melanoma of skin (HCC) Wound appears to be healing relatively well.  Discussed it may take some time to completely heal.  He will see his dermatologist next week.  Atrial fibrillation (HCC) Sinus rhythm.  He will continue Eliquis and atenolol.  Hypertension Adequately controlled.  Continue current regimen.  CAD (coronary artery disease) Stable.  He will continue to see cardiology.  Fatigue Likely post Covid.  Offered referral to the post Covid care center in [place] though he declines this.  We will follow-up with a chest x-ray.  Lab work as well.  Family history of prostate cancer in father PSA ordered.  Sleeping difficulty Continue trazodone and xanax.     Orders Placed This Encounter  Procedures  . DG Chest 2 View    Standing Status:   Future    Standing Expiration Date:   12/12/2020    Order Specific Question:   Reason for Exam (SYMPTOM  OR DIAGNOSIS REQUIRED)    Answer:   fatigue and dyspnea on exertion since he had COVID in February    Order Specific Question:   Preferred imaging location?    Answer:    [facility]    Order Specific Question:     Radiology Contrast Protocol - do NOT remove file path    Answer:   _0 [email or web address]  . TSH  . T4, free  . T3, free  . PSA, Medicare ( [place] only)  . Comp Met (CMET)  . CBC    No orders of the defined types were placed in this encounter.   This visit occurred during the SARS-CoV-2 public health emergency.  Safety protocols were in place, including screening questions prior to the visit, additional usage of staff PPE, and extensive cleaning of exam room while observing appropriate contact time as indicated for disinfecting solutions.    Tommi Rumps, MD [place]

## 2019-10-13 NOTE — Assessment & Plan Note (Signed)
Sinus rhythm.  He will continue Eliquis and atenolol.

## 2019-10-13 NOTE — Assessment & Plan Note (Addendum)
Continue trazodone and xanax.

## 2019-10-13 NOTE — Assessment & Plan Note (Signed)
Wound appears to be healing relatively well.  Discussed it may take some time to completely heal.  He will see his dermatologist next week.

## 2019-10-13 NOTE — Assessment & Plan Note (Signed)
Likely post Covid.  Offered referral to the post Covid care center in [place] though he declines this.  We will follow-up with a chest x-ray.  Lab work as well.

## 2019-10-13 NOTE — Assessment & Plan Note (Signed)
Stable.  He will continue to see cardiology.

## 2019-10-16 ENCOUNTER — Ambulatory Visit: Payer: PPO

## 2019-10-16 ENCOUNTER — Ambulatory Visit (INDEPENDENT_AMBULATORY_CARE_PROVIDER_SITE_OTHER): Payer: PPO | Admitting: Dermatology

## 2019-10-16 ENCOUNTER — Other Ambulatory Visit: Payer: Self-pay

## 2019-10-16 DIAGNOSIS — H35033 Hypertensive retinopathy, bilateral: Secondary | ICD-10-CM | POA: Diagnosis not present

## 2019-10-16 DIAGNOSIS — L570 Actinic keratosis: Secondary | ICD-10-CM | POA: Diagnosis not present

## 2019-10-16 DIAGNOSIS — Z85820 Personal history of malignant melanoma of skin: Secondary | ICD-10-CM

## 2019-10-16 MED ORDER — AMINOLEVULINIC ACID HCL 20 % EX SOLR
1.0000 "application " | Freq: Once | CUTANEOUS | Status: AC
  Administered 2019-10-16: 354 mg via TOPICAL

## 2019-10-16 MED ORDER — MUPIROCIN 2 % EX OINT: 1.0000 "application " | TOPICAL_OINTMENT | Freq: Every day | CUTANEOUS | 0 refills | Status: DC

## 2019-10-16 NOTE — Progress Notes (Signed)
   Follow-Up Visit   Subjective  Karl Myers is a 74 y.o. male who presents for the following: Other (History of Melanoma of right 5th toe - S/P toe amputation. Dr. Caryl Bis wanted patient to have healing checked by Dr. Nehemiah Massed.).    The following portions of the chart were reviewed this encounter and updated as appropriate:  Tobacco  Allergies  Meds  Problems  Med Hx  Surg Hx  Fam Hx      Review of Systems:  No other skin or systemic complaints except as noted in HPI or Assessment and Plan.  Objective  Well appearing patient in no apparent distress; mood and affect are within normal limits.  A focused examination was performed including right foot. Relevant physical exam findings are noted in the Assessment and Plan.  Objective  Right 5th Distal Dorsal Toe: Healing amputation site.  No infection.   Assessment & Plan  History of melanoma Right 5th Distal Dorsal Toe  History of Melanoma of right 5th toe - healing well.  Per patient, and correspondence note, Sentinel Lymph node biopsy was negative.  Patient to start Mupirocin and keep area covered while healing. Keep follow up appointment as scheduled.  mupirocin ointment (BACTROBAN) 2 % - Right 5th Distal Dorsal Toe  Return in about 2 months (around 12/16/2019) for as scheduled.   I, Ashok Cordia, CMA, am acting as scribe for [name], MD .  Documentation: I have reviewed the above documentation for accuracy and completeness, and I agree with the above.  [name], MD

## 2019-10-16 NOTE — Progress Notes (Signed)
Patient completed PDT therapy today.  AK (actinic keratosis) Scalp  Photodynamic therapy - Scalp Procedure discussed: discussed risks, benefits, side effects. and alternatives   Prep: site scrubbed/prepped with acetone   Location:  Scalp Number of lesions:  Multiple Type of treatment:  Blue light Aminolevulinic Acid (see MAR for details): Levulan Number of Levulan sticks used:  1 Incubation time (minutes):  120 Number of minutes under lamp:  16 Number of seconds under lamp:  40 Cooling:  Floor fan Outcome: patient tolerated procedure well with no complications   Post-procedure details: sunscreen applied    Aminolevulinic Acid HCl 20 % SOLR 354 mg - Scalp

## 2019-10-16 NOTE — Patient Instructions (Signed)

## 2019-10-17 ENCOUNTER — Encounter: Payer: Self-pay | Admitting: Dermatology

## 2019-10-27 ENCOUNTER — Ambulatory Visit: Payer: PPO | Attending: Internal Medicine

## 2019-10-27 DIAGNOSIS — Z23 Encounter for immunization: Secondary | ICD-10-CM

## 2019-10-27 NOTE — Progress Notes (Signed)
   Covid-19 Vaccination Clinic  Name:  Kamani Rutenberg    MRN: [id number] DOB: 1945-08-15  10/27/2019  Mr. Truelock was observed post Covid-19 immunization for 15 minutes without incident. He was provided with Vaccine Information Sheet and instruction to access the V-Safe system.   Mr. Parpart was instructed to call 911 with any severe reactions post vaccine: Marland Kitchen Difficulty breathing  . Swelling of face and throat  . A fast heartbeat  . A bad rash all over body  . Dizziness and weakness   Immunizations Administered    Name Date Dose VIS Date Route   Pfizer COVID-19 Vaccine 10/27/2019  9:49 AM 0.3 mL 08/02/2018 Intramuscular   Manufacturer: [place]   Lot: [id number]   [place]: [id number]

## 2019-10-30 ENCOUNTER — Ambulatory Visit: Payer: PPO

## 2019-10-30 ENCOUNTER — Other Ambulatory Visit: Payer: Self-pay

## 2019-10-30 DIAGNOSIS — L570 Actinic keratosis: Secondary | ICD-10-CM | POA: Diagnosis not present

## 2019-10-30 MED ORDER — AMINOLEVULINIC ACID HCL 20 % EX SOLR
1.0000 "application " | Freq: Once | CUTANEOUS | Status: AC
  Administered 2019-10-30: 354 mg via TOPICAL

## 2019-10-30 NOTE — Progress Notes (Signed)
Patient completed PDT therapy today.  AK (actinic keratosis) Head - Anterior (Face)  Photodynamic therapy - Head - Anterior (Face) Procedure discussed: discussed risks, benefits, side effects. and alternatives   Prep: site scrubbed/prepped with acetone   Number of lesions:  Multiple Type of treatment:  Blue light Aminolevulinic Acid (see MAR for details): Levulan Number of Levulan sticks used:  1 Incubation time (minutes):  60 Number of minutes under lamp:  16 Number of seconds under lamp:  40 Cooling:  Floor fan Outcome: patient tolerated procedure well with no complications   Post-procedure details: sunscreen applied    Aminolevulinic Acid HCl 20 % SOLR 354 mg - Head - Anterior (Face)

## 2019-10-30 NOTE — Patient Instructions (Signed)

## 2019-10-31 DIAGNOSIS — M5412 Radiculopathy, cervical region: Secondary | ICD-10-CM | POA: Diagnosis not present

## 2019-10-31 DIAGNOSIS — M5416 Radiculopathy, lumbar region: Secondary | ICD-10-CM | POA: Diagnosis not present

## 2019-10-31 DIAGNOSIS — M5136 Other intervertebral disc degeneration, lumbar region: Secondary | ICD-10-CM | POA: Diagnosis not present

## 2019-10-31 DIAGNOSIS — M5030 Other cervical disc degeneration, unspecified cervical region: Secondary | ICD-10-CM | POA: Diagnosis not present

## 2019-10-31 DIAGNOSIS — M47812 Spondylosis without myelopathy or radiculopathy, cervical region: Secondary | ICD-10-CM | POA: Diagnosis not present

## 2019-11-14 ENCOUNTER — Other Ambulatory Visit: Payer: Self-pay | Admitting: Family

## 2019-11-14 ENCOUNTER — Other Ambulatory Visit: Payer: Self-pay | Admitting: Family Medicine

## 2019-11-14 DIAGNOSIS — M5136 Other intervertebral disc degeneration, lumbar region: Secondary | ICD-10-CM | POA: Diagnosis not present

## 2019-11-14 DIAGNOSIS — M5416 Radiculopathy, lumbar region: Secondary | ICD-10-CM | POA: Diagnosis not present

## 2019-11-14 DIAGNOSIS — I480 Paroxysmal atrial fibrillation: Secondary | ICD-10-CM

## 2019-11-14 DIAGNOSIS — M48062 Spinal stenosis, lumbar region with neurogenic claudication: Secondary | ICD-10-CM | POA: Diagnosis not present

## 2019-11-14 NOTE — Telephone Encounter (Signed)
74 M 76.4 kg, SCr 1.11 (5/21), LOV [place] 3/21

## 2019-11-17 ENCOUNTER — Ambulatory Visit: Payer: PPO

## 2019-12-01 ENCOUNTER — Ambulatory Visit: Payer: PPO | Attending: Internal Medicine

## 2019-12-01 DIAGNOSIS — Z23 Encounter for immunization: Secondary | ICD-10-CM

## 2019-12-01 NOTE — Progress Notes (Signed)
   Covid-19 Vaccination Clinic  Name:  Toure Edmonds    MRN: [id number] DOB: 1946-04-30  12/01/2019  Mr. Budney was observed post Covid-19 immunization for 15 minutes without incident. He was provided with Vaccine Information Sheet and instruction to access the V-Safe system.   Mr. Massmann was instructed to call 911 with any severe reactions post vaccine: Marland Kitchen Difficulty breathing  . Swelling of face and throat  . A fast heartbeat  . A bad rash all over body  . Dizziness and weakness   Immunizations Administered    Name Date Dose VIS Date Route   Pfizer COVID-19 Vaccine 12/01/2019  9:52 AM 0.3 mL 08/02/2018 Intramuscular   Manufacturer: [place]   Lot: [id number]   [place]: [id number]

## 2019-12-18 ENCOUNTER — Ambulatory Visit: Payer: PPO | Admitting: Dermatology

## 2019-12-18 ENCOUNTER — Other Ambulatory Visit: Payer: Self-pay

## 2019-12-18 DIAGNOSIS — L719 Rosacea, unspecified: Secondary | ICD-10-CM | POA: Diagnosis not present

## 2019-12-18 DIAGNOSIS — B353 Tinea pedis: Secondary | ICD-10-CM | POA: Diagnosis not present

## 2019-12-18 DIAGNOSIS — L814 Other melanin hyperpigmentation: Secondary | ICD-10-CM | POA: Diagnosis not present

## 2019-12-18 DIAGNOSIS — D229 Melanocytic nevi, unspecified: Secondary | ICD-10-CM | POA: Diagnosis not present

## 2019-12-18 DIAGNOSIS — Z85820 Personal history of malignant melanoma of skin: Secondary | ICD-10-CM | POA: Diagnosis not present

## 2019-12-18 DIAGNOSIS — D692 Other nonthrombocytopenic purpura: Secondary | ICD-10-CM | POA: Diagnosis not present

## 2019-12-18 DIAGNOSIS — Z1283 Encounter for screening for malignant neoplasm of skin: Secondary | ICD-10-CM

## 2019-12-18 DIAGNOSIS — L821 Other seborrheic keratosis: Secondary | ICD-10-CM | POA: Diagnosis not present

## 2019-12-18 DIAGNOSIS — D1800 Hemangioma unspecified site: Secondary | ICD-10-CM

## 2019-12-18 DIAGNOSIS — L578 Other skin changes due to chronic exposure to nonionizing radiation: Secondary | ICD-10-CM | POA: Diagnosis not present

## 2019-12-18 MED ORDER — KETOCONAZOLE 2 % EX CREA: TOPICAL_CREAM | CUTANEOUS | 3 refills | Status: DC

## 2019-12-18 NOTE — Progress Notes (Signed)
   Follow-Up Visit   Subjective  Karl Myers is a 74 y.o. 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 The patient presents for Total-Body Skin Exam (TBSE) for skin cancer screening and mole check.  The following portions of the chart were reviewed this encounter and updated as appropriate:  Tobacco  Allergies  Meds  Problems  Med Hx  Surg Hx  Fam Hx     Review of Systems:  No other skin or systemic complaints except as noted in HPI or Assessment and Plan.  Objective  Well appearing patient in no apparent distress; mood and affect are within normal limits.  A full examination was performed including scalp, head, eyes, ears, nose, lips, neck, chest, axillae, abdomen, back, buttocks, bilateral upper extremities, bilateral lower extremities, hands, feet, fingers, toes, fingernails, and toenails. All findings within normal limits unless otherwise noted below.  Objective  Face: Mild pinkess on the cheeks  Objective  feet: Scale  Assessment & Plan    Rosacea Face  Benign, observe.    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 Sentinal lymph node biopsy negative (right groin) - No evidence of recurrence today - No lymphadenopathy - Recommend regular full body skin exams - Recommend daily broad spectrum sunscreen SPF 30+ to sun-exposed areas, reapply every 2 hours as needed.  - Call if any new or changing lesions are noted between office visits  Lentigines - Scattered tan macules - Discussed due to sun exposure - Benign, observe - Call for any changes  Seborrheic Keratoses - Stuck-on, waxy, tan-brown papules and plaques  - Discussed benign etiology and prognosis. - Observe - Call for any  changes  Melanocytic Nevi - Tan-brown and/or pink-flesh-colored symmetric macules and papules - Benign appearing on exam today - Observation - Call clinic for new or changing moles - Recommend daily use of broad spectrum spf 30+ sunscreen to sun-exposed areas.   Hemangiomas - Red papules - Discussed benign nature - Observe - Call for any changes  Actinic Damage - diffuse scaly erythematous macules with underlying dyspigmentation - Recommend daily broad spectrum sunscreen SPF 30+ to sun-exposed areas, reapply every 2 hours as needed.  - Call for new or changing lesions.  Purpura - Violaceous macules and patches - Benign - Related to age, sun damage and/or use of blood thinners - Observe - Can use OTC arnica containing moisturizer such as Dermend Bruise Formula if desired - Call for worsening or other concerns  Skin cancer screening performed today.   Return in about 3 months (around 03/19/2020) for TBSE.  Luther Redo, CMA, am acting as scribe for [name], MD .  Documentation: I have reviewed the above documentation for accuracy and completeness, and I agree with the above.  [name], MD

## 2019-12-19 ENCOUNTER — Telehealth: Payer: Self-pay | Admitting: Cardiovascular Disease

## 2019-12-19 ENCOUNTER — Encounter: Payer: Self-pay | Admitting: Dermatology

## 2019-12-19 DIAGNOSIS — M5030 Other cervical disc degeneration, unspecified cervical region: Secondary | ICD-10-CM | POA: Diagnosis not present

## 2019-12-19 DIAGNOSIS — M4802 Spinal stenosis, cervical region: Secondary | ICD-10-CM | POA: Diagnosis not present

## 2019-12-19 NOTE — Telephone Encounter (Signed)
   [facility] Pre-operative Risk Assessment    HEARTCARE STAFF: - Please ensure there is not already an duplicate clearance open for this procedure. - Under Visit Info/Reason for Call, type in Other and utilize the format Clearance MM/DD/YY or Clearance TBD. Do not use dashes or single digits. - If request is for dental extraction, please clarify the # of teeth to be extracted.  Request for surgical clearance:  1. What type of surgery is being performed? Epidural steriod injection in the C-Spine  2. When is this surgery scheduled? TBD  3. What type of clearance is required (medical clearance vs. Pharmacy clearance to hold med vs. Both)? both  4. Are there any medications that need to be held prior to surgery and how long? Hold Eliquis for 72 hours prior   5. Practice name and name of physician performing surgery? [place], DO  6. What is the office phone number? [phone]   7.   What is the office fax number? [phone]  8.   Anesthesia type (None, local, MAC, general) ? Not listed    Ace Gins 12/19/2019, 4:22 PM  _________________________________________________________________   (provider comments below)

## 2019-12-20 NOTE — Telephone Encounter (Signed)
   Primary Cardiologist: Ida Rogue, MD  Chart reviewed and patient contacted by phone today as part of pre-operative protocol coverage. Given past medical history and time since last visit, based on ACC/AHA guidelines, Karl Myers would be at acceptable risk for the planned procedure without further cardiovascular testing.   OK to hold Eliquis 3 days pre op- resume as soon as safe post op. I informed the patient of these instructions.   I will route this recommendation to the requesting party via Epic fax function and remove from pre-op pool.  Please call with questions.  Kerin Ransom, PA-C 12/20/2019, 1:36 PM

## 2019-12-20 NOTE — Telephone Encounter (Signed)
Patient with diagnosis of atrial fibrillation on Eliquis for anticoagulation.    Procedure: epidural steroid injection in C-spine Date of procedure: TBD  CHADS2-VASc score of  3 (HTN, AGE, CAD)  CrCl 63.1 Platelet count 210  Per office protocol, patient can hold Eliquis for 3 days prior to procedure.    Patient will not need bridging with Lovenox (enoxaparin) around procedure.

## 2019-12-29 DIAGNOSIS — M5412 Radiculopathy, cervical region: Secondary | ICD-10-CM | POA: Diagnosis not present

## 2019-12-29 DIAGNOSIS — M5030 Other cervical disc degeneration, unspecified cervical region: Secondary | ICD-10-CM | POA: Diagnosis not present

## 2020-01-23 ENCOUNTER — Telehealth (INDEPENDENT_AMBULATORY_CARE_PROVIDER_SITE_OTHER): Payer: PPO | Admitting: Family Medicine

## 2020-01-23 ENCOUNTER — Other Ambulatory Visit: Payer: Self-pay

## 2020-01-23 ENCOUNTER — Encounter: Payer: Self-pay | Admitting: Family Medicine

## 2020-01-23 DIAGNOSIS — I10 Essential (primary) hypertension: Secondary | ICD-10-CM | POA: Diagnosis not present

## 2020-01-23 DIAGNOSIS — R4189 Other symptoms and signs involving cognitive functions and awareness: Secondary | ICD-10-CM | POA: Insufficient documentation

## 2020-01-23 DIAGNOSIS — F419 Anxiety disorder, unspecified: Secondary | ICD-10-CM | POA: Diagnosis not present

## 2020-01-23 DIAGNOSIS — F488 Other specified nonpsychotic mental disorders: Secondary | ICD-10-CM | POA: Diagnosis not present

## 2020-01-23 MED ORDER — ESCITALOPRAM OXALATE 5 MG PO TABS: 5.0000 mg | ORAL_TABLET | Freq: Every day | ORAL | 1 refills | Status: DC

## 2020-01-23 NOTE — Progress Notes (Signed)
Virtual Visit via telephone Note  This visit type was conducted due to national recommendations for restrictions regarding the COVID-19 pandemic (e.g. social distancing).  This format is felt to be most appropriate for this patient at this time.  All issues noted in this document were discussed and addressed.  No physical exam was performed (except for noted visual exam findings with Video Visits).   I connected with Karl Myers today at 12:30 PM EDT by telephone and verified that I am speaking with the correct person using two identifiers. Location patient: home Location provider: work  Persons participating in the virtual visit: patient, provider, Nichlos Kunzler (wife)  I discussed the limitations, risks, security and privacy concerns of performing an evaluation and management service by telephone and the availability of in person appointments. I also discussed with the patient that there may be a patient responsible charge related to this service. The patient expressed understanding and agreed to proceed.  Interactive audio and video telecommunications were attempted between this provider and patient, however failed, due to patient having technical difficulties OR patient did not have access to video capability.  We continued and completed visit with audio only.   Reason for visit: f/u.  HPI: HYPERTENSION  Disease Monitoring  Home BP Monitoring 131/74, 132/69, 125/73, 100/52, 115/63 Chest pain- no    Dyspnea- no Medications  Compliance-  Taking lisinopril, atenolol.  Edema- no  Anxiety: stays wired.  Notes he does sleep a lot at night.  Take Xanax and trazodone in the evening.  No depression.  Brain fog: Started around the time he got COVID-19.  Worsened with the COVID-19 infection and has persisted.  He has trouble concentrating.  Difficulty doing his homework for church.  This is been going on for months.  Has not been improving.     ROS: See pertinent positives and negatives per  HPI.  Past Medical History:  Diagnosis Date  . Actinic keratosis   . Arthritis   . COPD (chronic obstructive pulmonary disease) ([place])   . DDD (degenerative disc disease), cervical   . DDD (degenerative disc disease), cervical   . Depression   . Hypertension   . Melanoma ([place]) 09/04/2019   R 5th toe lateral tip, Stage IV, 0.54m Breslow's  . Pre-diabetes     Past Surgical History:  Procedure Laterality Date  . BACK SURGERY     x 3  . CARPAL TUNNEL RELEASE Right 08/19/2017   Procedure: CARPAL TUNNEL RELEASE;  Surgeon: MHessie Knows MD;  Location: [facility] ORS;  Service: Orthopedics;  Laterality: Right;  . CATARACT EXTRACTION W/PHACO Right 11/17/2016   Procedure: CATARACT EXTRACTION PHACO AND INTRAOCULAR LENS PLACEMENT (IOC);  Surgeon: PBirder Robson MD;  Location: [facility] ORS;  Service: Ophthalmology;  Laterality: Right;  [place]37 AP% 12.6 CDE 4.69 Fluid pack lot # [id number]  . CATARACT EXTRACTION W/PHACO Left 12/08/2016   Procedure: CATARACT EXTRACTION PHACO AND INTRAOCULAR LENS PLACEMENT (IOC);  Surgeon: PBirder Robson MD;  Location: [facility] ORS;  Service: Ophthalmology;  Laterality: Left;  [place]:33 AP% 13.9 CDE 4.63 Fluid pack lot # [id number] . ULNAR TUNNEL RELEASE Right 08/19/2017   Procedure: CUBITAL TUNNEL RELEASE;  Surgeon: MHessie Knows MD;  Location: [facility] ORS;  Service: Orthopedics;  Laterality: Right;    Family History  Problem Relation Age of Onset  . Heart attack Mother   . Prostate cancer Father   . Breast cancer Sister 757 . Dementia Sister   . Cancer Brother  unknown what kind    SOCIAL HX: Non-smoker   Current Outpatient Medications:  .  ALPRAZolam (XANAX) 0.5 MG tablet, Take 1 tablet (0.5 mg total) by mouth at bedtime., Disp: 45 tablet, Rfl: 0 .  atenolol (TENORMIN) 25 MG tablet, TAKE ONE TABLET AT BEDTIME, Disp: 90 tablet, Rfl: 3 .  atorvastatin (LIPITOR) 40 MG tablet, TAKE ONE TABLET BY MOUTH EVERY DAY (Patient taking differently: Take 40 mg by mouth  at bedtime. ), Disp: 90 tablet, Rfl: 3 .  benzonatate (TESSALON) 200 MG capsule, Take 1 capsule (200 mg total) by mouth 2 (two) times daily as needed for cough., Disp: 20 capsule, Rfl: 0 .  cholecalciferol (VITAMIN D3) 25 MCG (1000 UNIT) tablet, Take 1,000 Units by mouth daily. , Disp: , Rfl:  .  ELIQUIS 5 MG TABS tablet, TAKE ONE TABLET TWICE DAILY, Disp: 180 tablet, Rfl: 1 .  ketoconazole (NIZORAL) 2 % cream, Apply to the feet and between the toes QHS., Disp: 60 g, Rfl: 3 .  lisinopril (ZESTRIL) 10 MG tablet, TAKE ONE TABLET AT BEDTIME, Disp: 90 tablet, Rfl: 3 .  mupirocin ointment (BACTROBAN) 2 %, Apply 1 application topically daily. With dressing changes, Disp: 22 g, Rfl: 0 .  nicotine (NICODERM CQ - DOSED IN MG/24 HOURS) 21 mg/24hr patch, Place 1 patch (21 mg total) onto the skin daily. For 6 weeks. Call for next step once on the last week., Disp: 42 patch, Rfl: 0 .  nitroGLYCERIN (NITROSTAT) 0.4 MG SL tablet, Place 1 tablet (0.4 mg total) under the tongue every 5 (five) minutes as needed for chest pain. For up to 3 doses per episode., Disp: 50 tablet, Rfl: 0 .  tiZANidine (ZANAFLEX) 4 MG tablet, Take 1 tablet (4 mg total) by mouth every 6 (six) hours as needed for muscle spasms., Disp: 30 tablet, Rfl: 0 .  traZODone (DESYREL) 150 MG tablet, TAKE ONE TABLET AT BEDTIME, Disp: 90 tablet, Rfl: 1 .  vitamin C (ASCORBIC ACID) 250 MG tablet, Take 250 mg by mouth daily. , Disp: , Rfl:  .  Zinc 100 MG TABS, Take 100 mg by mouth daily. , Disp: , Rfl:  .  escitalopram (LEXAPRO) 5 MG tablet, Take 1 tablet (5 mg total) by mouth daily., Disp: 90 tablet, Rfl: 1  Current Facility-Administered Medications:  .  oxyCODONE (Oxy IR/ROXICODONE) immediate release tablet 5 mg, 5 mg, Oral, Q3H PRN, Hessie Knows, MD  EXAMTonette Bihari per patient if applicable:  GENERAL: alert, oriented, appears well and in no acute distress  HEENT: atraumatic, conjunttiva clear, no obvious abnormalities on inspection of external  nose and ears  NECK: normal movements of the head and neck  LUNGS: on inspection no signs of respiratory distress, breathing rate appears normal, no obvious gross SOB, gasping or wheezing  CV: no obvious cyanosis  MS: moves all visible extremities without noticeable abnormality  PSYCH/NEURO: pleasant and cooperative, no obvious depression or anxiety, speech and thought processing grossly intact  ASSESSMENT AND PLAN:  Discussed the following assessment and plan:  Hypertension Adequately controlled.  Continue current regimen.  Check CMP.  Anxiety Continues to be an issue.  We will trial a low-dose of Lexapro in addition to his current regimen.  He will follow up in 6 weeks.  Brain fog Likely the result of his COVID-19 infection previously.  We will check lab work to evaluate for other causes and if lab work is negative consider referral to a specialist for further treatment and evaluation.   Orders  Placed This Encounter  Procedures  . Comp Met (CMET)    Standing Status:   Future    Standing Expiration Date:   01/22/2021  . TSH    Standing Status:   Future    Standing Expiration Date:   01/22/2021  . CBC w/Diff    Standing Status:   Future    Standing Expiration Date:   01/22/2021  . B12    Standing Status:   Future    Standing Expiration Date:   01/22/2021    Meds ordered this encounter  Medications  . escitalopram (LEXAPRO) 5 MG tablet    Sig: Take 1 tablet (5 mg total) by mouth daily.    Dispense:  90 tablet    Refill:  1     I discussed the assessment and treatment plan with the patient. The patient was provided an opportunity to ask questions and all were answered. The patient agreed with the plan and demonstrated an understanding of the instructions.   The patient was advised to call back or seek an in-person evaluation if the symptoms worsen or if the condition fails to improve as anticipated.  I provided 14 minutes of non-face-to-face time during this  encounter.   Tommi Rumps, MD

## 2020-01-23 NOTE — Assessment & Plan Note (Addendum)
Adequately controlled.  Continue current regimen.  Check CMP. 

## 2020-01-23 NOTE — Assessment & Plan Note (Signed)
Likely the result of his COVID-19 infection previously.  We will check lab work to evaluate for other causes and if lab work is negative consider referral to a specialist for further treatment and evaluation.

## 2020-01-23 NOTE — Assessment & Plan Note (Signed)
Continues to be an issue.  We will trial a low-dose of Lexapro in addition to his current regimen.  He will follow up in 6 weeks.

## 2020-01-29 ENCOUNTER — Other Ambulatory Visit: Payer: Self-pay | Admitting: Family Medicine

## 2020-02-05 DIAGNOSIS — C4372 Malignant melanoma of left lower limb, including hip: Secondary | ICD-10-CM | POA: Diagnosis not present

## 2020-02-07 ENCOUNTER — Other Ambulatory Visit: Payer: PPO

## 2020-02-07 ENCOUNTER — Other Ambulatory Visit: Payer: Self-pay

## 2020-02-16 ENCOUNTER — Other Ambulatory Visit: Payer: Self-pay | Admitting: Family Medicine

## 2020-02-16 DIAGNOSIS — E785 Hyperlipidemia, unspecified: Secondary | ICD-10-CM

## 2020-02-19 ENCOUNTER — Ambulatory Visit: Payer: PPO

## 2020-02-20 ENCOUNTER — Other Ambulatory Visit (INDEPENDENT_AMBULATORY_CARE_PROVIDER_SITE_OTHER): Payer: PPO

## 2020-02-20 ENCOUNTER — Other Ambulatory Visit: Payer: Self-pay

## 2020-02-20 DIAGNOSIS — F488 Other specified nonpsychotic mental disorders: Secondary | ICD-10-CM

## 2020-02-20 DIAGNOSIS — I10 Essential (primary) hypertension: Secondary | ICD-10-CM

## 2020-02-20 DIAGNOSIS — R4189 Other symptoms and signs involving cognitive functions and awareness: Secondary | ICD-10-CM

## 2020-02-20 LAB — COMPREHENSIVE METABOLIC PANEL
ALT: 13 U/L (ref 0–53)
AST: 17 U/L (ref 0–37)
Albumin: 4.3 g/dL (ref 3.5–5.2)
Alkaline Phosphatase: 74 U/L (ref 39–117)
BUN: 15 mg/dL (ref 6–23)
CO2: 28 mEq/L (ref 19–32)
Calcium: 9.1 mg/dL (ref 8.4–10.5)
Chloride: 103 mEq/L (ref 96–112)
Creatinine, Ser: 1.19 mg/dL (ref 0.40–1.50)
GFR: 59.68 mL/min — ABNORMAL LOW (ref >60.00)
Glucose, Bld: 103 mg/dL — ABNORMAL HIGH (ref 70–99)
Potassium: 4.2 mEq/L (ref 3.5–5.1)
Sodium: 140 mEq/L (ref 135–145)
Total Bilirubin: 0.6 mg/dL (ref 0.2–1.2)
Total Protein: 6.4 g/dL (ref 6.0–8.3)

## 2020-02-20 LAB — CBC WITH DIFFERENTIAL/PLATELET
Basophils Absolute: 0.1 10*3/uL (ref 0.0–0.1)
Basophils Relative: 0.8 % (ref 0.0–3.0)
Eosinophils Absolute: 0.4 10*3/uL (ref 0.0–0.7)
Eosinophils Relative: 4.2 % (ref 0.0–5.0)
HCT: 43.2 % (ref 39.0–52.0)
Hemoglobin: 14.6 g/dL (ref 13.0–17.0)
Lymphocytes Relative: 26.3 % (ref 12.0–46.0)
Lymphs Abs: 2.7 10*3/uL (ref 0.7–4.0)
MCHC: 33.7 g/dL (ref 30.0–36.0)
MCV: 98.2 fl (ref 78.0–100.0)
Monocytes Absolute: 1 10*3/uL (ref 0.1–1.0)
Monocytes Relative: 10.1 % (ref 3.0–12.0)
Neutro Abs: 6.1 10*3/uL (ref 1.4–7.7)
Neutrophils Relative %: 58.6 % (ref 43.0–77.0)
Platelets: 185 10*3/uL (ref 150.0–400.0)
RBC: 4.4 Mil/uL (ref 4.22–5.81)
RDW: 15.2 % (ref 11.5–15.5)
WBC: 10.4 10*3/uL (ref 4.0–10.5)

## 2020-02-20 LAB — VITAMIN B12: Vitamin B-12: 352 pg/mL (ref 211–911)

## 2020-02-20 LAB — TSH: TSH: 0.86 u[IU]/mL (ref 0.35–4.50)

## 2020-02-23 ENCOUNTER — Other Ambulatory Visit: Payer: Self-pay | Admitting: Family Medicine

## 2020-02-23 DIAGNOSIS — N179 Acute kidney failure, unspecified: Secondary | ICD-10-CM

## 2020-03-06 ENCOUNTER — Ambulatory Visit (INDEPENDENT_AMBULATORY_CARE_PROVIDER_SITE_OTHER): Payer: PPO

## 2020-03-06 VITALS — Ht 68.0 in | Wt 167.0 lb

## 2020-03-06 DIAGNOSIS — Z0000 Encounter for general adult medical examination without abnormal findings: Secondary | ICD-10-CM | POA: Diagnosis not present

## 2020-03-06 NOTE — Patient Instructions (Addendum)
[name] , Thank you for taking time to come for your Medicare Wellness Visit. I appreciate your ongoing commitment to your health goals. Please review the following plan we discussed and let me know if I can assist you in the future.   These are the goals we discussed: Goals      Patient Stated   .  DIET - INCREASE WATER INTAKE (pt-stated)    .  Walk more for exercise (pt-stated)       This is a list of the screening recommended for you and due dates:  Health Maintenance  Topic Date Due  . Flu Shot  09/05/2020*  . Tetanus Vaccine  03/06/2021*  . Cologuard (Stool DNA test)  03/06/2021*  . Pneumonia vaccines (1 of 2 - PCV13) 03/06/2021*  . COVID-19 Vaccine  Completed  .  Hepatitis C: One time screening is recommended by Center for Disease Control  (CDC) for  adults born from [birth year] through 1965.   Completed  *Topic was postponed. The date shown is not the original due date.    Immunizations Immunization History  Administered Date(s) Administered  . Fluad Quad(high Dose 65+) 02/20/2019  . Influenza, High Dose Seasonal PF 03/12/2017, 03/31/2018  . PFIZER SARS-COV-2 Vaccination 10/27/2019, 12/01/2019   Keep all routine maintenance appointments.   Next scheduled lab 03/07/20 @ 11:15  Advanced directives: yes, on file  Conditions/risks identified: none new.   Follow up in one year for your annual wellness visit.   Preventive Care 58 Years and Older, Male Preventive care refers to lifestyle choices and visits with your health care provider that can promote health and wellness. What does preventive care include?  A yearly physical exam. This is also called an annual well check.  Dental exams once or twice a year.  Routine eye exams. Ask your health care provider how often you should have your eyes checked.  Personal lifestyle choices, including:  Daily care of your teeth and gums.  Regular physical activity.  Eating a healthy diet.  Avoiding tobacco and drug  use.  Limiting alcohol use.  Practicing safe sex.  Taking low doses of aspirin every day.  Taking vitamin and mineral supplements as recommended by your health care provider. What happens during an annual well check? The services and screenings done by your health care provider during your annual well check will depend on your age, overall health, lifestyle risk factors, and family history of disease. Counseling  Your health care provider may ask you questions about your:  Alcohol use.  Tobacco use.  Drug use.  Emotional well-being.  Home and relationship well-being.  Sexual activity.  Eating habits.  History of falls.  Memory and ability to understand (cognition).  Work and work Statistician. Screening  You may have the following tests or measurements:  Height, weight, and BMI.  Blood pressure.  Lipid and cholesterol levels. These may be checked every 5 years, or more frequently if you are over 52 years old.  Skin check.  Lung cancer screening. You may have this screening every year starting at age 63 if you have a 30-pack-year history of smoking and currently smoke or have quit within the past 15 years.  Fecal occult blood test (FOBT) of the stool. You may have this test every year starting at age 13.  Flexible sigmoidoscopy or colonoscopy. You may have a sigmoidoscopy every 5 years or a colonoscopy every 10 years starting at age 70.  Prostate cancer screening. Recommendations will vary depending on your  family history and other risks.  Hepatitis C blood test.  Hepatitis B blood test.  Sexually transmitted disease (STD) testing.  Diabetes screening. This is done by checking your blood sugar (glucose) after you have not eaten for a while (fasting). You may have this done every 1-3 years.  Abdominal aortic aneurysm (AAA) screening. You may need this if you are a current or former smoker.  Osteoporosis. You may be screened starting at age 61 if you are at  high risk. Talk with your health care provider about your test results, treatment options, and if necessary, the need for more tests. Vaccines  Your health care provider may recommend certain vaccines, such as:  Influenza vaccine. This is recommended every year.  Tetanus, diphtheria, and acellular pertussis (Tdap, Td) vaccine. You may need a Td booster every 10 years.  Zoster vaccine. You may need this after age 35.  Pneumococcal 13-valent conjugate (PCV13) vaccine. One dose is recommended after age 79.  Pneumococcal polysaccharide (PPSV23) vaccine. One dose is recommended after age 46. Talk to your health care provider about which screenings and vaccines you need and how often you need them. This information is not intended to replace advice given to you by your health care provider. Make sure you discuss any questions you have with your health care provider. Document Released: 06/21/2015 Document Revised: 02/12/2016 Document Reviewed: 03/26/2015 Elsevier Interactive Patient Education  2017 [place] Prevention in the Home Falls can cause injuries. They can happen to people of all ages. There are many things you can do to make your home safe and to help prevent falls. What can I do on the outside of my home?  Regularly fix the edges of walkways and driveways and fix any cracks.  Remove anything that might make you trip as you walk through a door, such as a raised step or threshold.  Trim any bushes or trees on the path to your home.  Use bright outdoor lighting.  Clear any walking paths of anything that might make someone trip, such as rocks or tools.  Regularly check to see if handrails are loose or broken. Make sure that both sides of any steps have handrails.  Any raised decks and porches should have guardrails on the edges.  Have any leaves, snow, or ice cleared regularly.  Use sand or salt on walking paths during winter.  Clean up any spills in your garage  right away. This includes oil or grease spills. What can I do in the bathroom?  Use night lights.  Install grab bars by the toilet and in the tub and shower. Do not use towel bars as grab bars.  Use non-skid mats or decals in the tub or shower.  If you need to sit down in the shower, use a plastic, non-slip stool.  Keep the floor dry. Clean up any water that spills on the floor as soon as it happens.  Remove soap buildup in the tub or shower regularly.  Attach bath mats securely with double-sided non-slip rug tape.  Do not have throw rugs and other things on the floor that can make you trip. What can I do in the bedroom?  Use night lights.  Make sure that you have a light by your bed that is easy to reach.  Do not use any sheets or blankets that are too big for your bed. They should not hang down onto the floor.  Have a firm chair that has side arms. You can  use this for support while you get dressed.  Do not have throw rugs and other things on the floor that can make you trip. What can I do in the kitchen?  Clean up any spills right away.  Avoid walking on wet floors.  Keep items that you use a lot in easy-to-reach places.  If you need to reach something above you, use a strong step stool that has a grab bar.  Keep electrical cords out of the way.  Do not use floor polish or wax that makes floors slippery. If you must use wax, use non-skid floor wax.  Do not have throw rugs and other things on the floor that can make you trip. What can I do with my stairs?  Do not leave any items on the stairs.  Make sure that there are handrails on both sides of the stairs and use them. Fix handrails that are broken or loose. Make sure that handrails are as long as the stairways.  Check any carpeting to make sure that it is firmly attached to the stairs. Fix any carpet that is loose or worn.  Avoid having throw rugs at the top or bottom of the stairs. If you do have throw rugs,  attach them to the floor with carpet tape.  Make sure that you have a light switch at the top of the stairs and the bottom of the stairs. If you do not have them, ask someone to add them for you. What else can I do to help prevent falls?  Wear shoes that:  Do not have high heels.  Have rubber bottoms.  Are comfortable and fit you well.  Are closed at the toe. Do not wear sandals.  If you use a stepladder:  Make sure that it is fully opened. Do not climb a closed stepladder.  Make sure that both sides of the stepladder are locked into place.  Ask someone to hold it for you, if possible.  Clearly mark and make sure that you can see:  Any grab bars or handrails.  First and last steps.  Where the edge of each step is.  Use tools that help you move around (mobility aids) if they are needed. These include:  Canes.  Walkers.  Scooters.  Crutches.  Turn on the lights when you go into a dark area. Replace any light bulbs as soon as they burn out.  Set up your furniture so you have a clear path. Avoid moving your furniture around.  If any of your floors are uneven, fix them.  If there are any pets around you, be aware of where they are.  Review your medicines with your doctor. Some medicines can make you feel dizzy. This can increase your chance of falling. Ask your doctor what other things that you can do to help prevent falls. This information is not intended to replace advice given to you by your health care provider. Make sure you discuss any questions you have with your health care provider. Document Released: 03/21/2009 Document Revised: 10/31/2015 Document Reviewed: 06/29/2014 Elsevier Interactive Patient Education  2017 [employer].

## 2020-03-06 NOTE — Progress Notes (Signed)
Subjective:   Karl Myers is a 74 y.o. male who presents for Medicare Annual/Subsequent preventive examination.  Review of Systems    No ROS.  Medicare Wellness Virtual Visit.  Cardiac Risk Factors include: advanced age (>5men, >6 women);hypertension;male gender     Objective:    Today's Vitals   03/06/20 1135  Weight: 167 lb (75.8 kg)  Height: 5\' 8"  (1.727 m)   Body mass index is 25.39 kg/m.  Advanced Directives 07/24/2019 07/24/2019 02/17/2019 08/25/2018 02/02/2018 08/19/2017 12/08/2016  Does Patient Have a Medical Advance Directive? Yes No Yes No Yes No No  Type of Advance Directive Living will - [place];Living will - [place];Living will - -  Does patient want to make changes to medical advance directive? No - Patient declined - No - Patient declined - No - Patient declined - -  Copy of [place] in Chart? - - Yes - validated most recent copy scanned in chart (See row information) - Yes - -  Would patient like information on creating a medical advance directive? - No - Patient declined - No - Patient declined - No - Patient declined No - Patient declined    Current Medications (verified) Outpatient Encounter Medications as of 03/06/2020  Medication Sig  . ALPRAZolam (XANAX) 0.5 MG tablet TAKE ONE-HALF TABLET BY MOUTH EVERY MORNING AND TAKE ONE TABLET BY MOUTH AT BEDTIME AS DIRECTED  . atenolol (TENORMIN) 25 MG tablet TAKE ONE TABLET AT BEDTIME  . atorvastatin (LIPITOR) 40 MG tablet TAKE 1 TABLET BY MOUTH DAILY  . benzonatate (TESSALON) 200 MG capsule Take 1 capsule (200 mg total) by mouth 2 (two) times daily as needed for cough.  . cholecalciferol (VITAMIN D3) 25 MCG (1000 UNIT) tablet Take 1,000 Units by mouth daily.   Marland Kitchen ELIQUIS 5 MG TABS tablet TAKE ONE TABLET TWICE DAILY  . escitalopram (LEXAPRO) 5 MG tablet TAKE ONE TABLET BY MOUTH EVERY DAY  . gabapentin (NEURONTIN) 100 MG capsule Take by mouth.  Marland Kitchen  ketoconazole (NIZORAL) 2 % cream Apply to the feet and between the toes QHS.  Marland Kitchen lisinopril (ZESTRIL) 10 MG tablet TAKE ONE TABLET AT BEDTIME  . mupirocin ointment (BACTROBAN) 2 % Apply 1 application topically daily. With dressing changes  . nicotine (NICODERM CQ - DOSED IN MG/24 HOURS) 21 mg/24hr patch Place 1 patch (21 mg total) onto the skin daily. For 6 weeks. Call for next step once on the last week.  . nitroGLYCERIN (NITROSTAT) 0.4 MG SL tablet Place 1 tablet (0.4 mg total) under the tongue every 5 (five) minutes as needed for chest pain. For up to 3 doses per episode.  Marland Kitchen tiZANidine (ZANAFLEX) 4 MG tablet Take 1 tablet (4 mg total) by mouth every 6 (six) hours as needed for muscle spasms.  . traMADol (ULTRAM) 50 MG tablet Take by mouth.  . traZODone (DESYREL) 150 MG tablet TAKE ONE TABLET AT BEDTIME  . vitamin C (ASCORBIC ACID) 250 MG tablet Take 250 mg by mouth daily.   . Zinc 100 MG TABS Take 100 mg by mouth daily.    Facility-Administered Encounter Medications as of 03/06/2020  Medication  . oxyCODONE (Oxy IR/ROXICODONE) immediate release tablet 5 mg    Allergies (verified) Hydrocodone-acetaminophen and Neurontin [gabapentin]   History: Past Medical History:  Diagnosis Date  . Actinic keratosis   . Arthritis   . COPD (chronic obstructive pulmonary disease) ([place])   . DDD (degenerative disc disease), cervical   .  DDD (degenerative disc disease), cervical   . Depression   . Hypertension   . Melanoma ([place]) 09/04/2019   R 5th toe lateral tip, Stage IV, 0.70mm Breslow's  . Pre-diabetes    Past Surgical History:  Procedure Laterality Date  . BACK SURGERY     x 3  . CARPAL TUNNEL RELEASE Right 08/19/2017   Procedure: CARPAL TUNNEL RELEASE;  Surgeon: Hessie Knows, MD;  Location: [facility] ORS;  Service: Orthopedics;  Laterality: Right;  . CATARACT EXTRACTION W/PHACO Right 11/17/2016   Procedure: CATARACT EXTRACTION PHACO AND INTRAOCULAR LENS PLACEMENT (IOC);  Surgeon: Birder Robson, MD;  Location: [facility] ORS;  Service: Ophthalmology;  Laterality: Right;  [place] 00:37 AP% 12.6 CDE 4.69 Fluid pack lot # [id number]  . CATARACT EXTRACTION W/PHACO Left 12/08/2016   Procedure: CATARACT EXTRACTION PHACO AND INTRAOCULAR LENS PLACEMENT (IOC);  Surgeon: Birder Robson, MD;  Location: [facility] ORS;  Service: Ophthalmology;  Laterality: Left;  [place] 00:33 AP% 13.9 CDE 4.63 Fluid pack lot # [id number]  . ULNAR TUNNEL RELEASE Right 08/19/2017   Procedure: CUBITAL TUNNEL RELEASE;  Surgeon: Hessie Knows, MD;  Location: [facility] ORS;  Service: Orthopedics;  Laterality: Right;   Family History  Problem Relation Age of Onset  . Heart attack Mother   . Prostate cancer Father   . Breast cancer Sister 38  . Dementia Sister   . Cancer Brother        unknown what kind   Social History   Socioeconomic History  . Marital status: Married    Spouse name: Not on file  . Number of children: Not on file  . Years of education: Not on file  . Highest education level: Not on file  Occupational History  . Not on file  Tobacco Use  . Smoking status: Current Every Day Smoker    Packs/day: 1.00    Last attempt to quit: 06/26/2017    Years since quitting: 2.6  . Smokeless tobacco: Never Used  Vaping Use  . Vaping Use: Never used  Substance and Sexual Activity  . Alcohol use: No  . Drug use: No  . Sexual activity: Not on file  Other Topics Concern  . Not on file  Social History Narrative  . Not on file   Social Determinants of Health   Financial Resource Strain: Low Risk   . Difficulty of Paying Living Expenses: Not hard at all  Food Insecurity: No Food Insecurity  . Worried About Charity fundraiser in the Last Year: Never true  . Ran Out of Food in the Last Year: Never true  Transportation Needs: No Transportation Needs  . Lack of Transportation (Medical): No  . Lack of Transportation (Non-Medical): No  Physical Activity:   . Days of Exercise per Week: Not on file  . Minutes of  Exercise per Session: Not on file  Stress: No Stress Concern Present  . Feeling of Stress : Not at all  Social Connections: Unknown  . Frequency of Communication with Friends and Family: Not on file  . Frequency of Social Gatherings with Friends and Family: Not on file  . Attends Religious Services: Not on file  . Active Member of Clubs or Organizations: Not on file  . Attends Archivist Meetings: Not on file  . Marital Status: Married    Tobacco Counseling Ready to quit: Not Answered Counseling given: Not Answered   Clinical Intake:  Pre-visit preparation completed: Yes        Diabetes: No  How  often do you need to have someone help you when you read instructions, pamphlets, or other written materials from your doctor or pharmacy?: 1 - Never       Activities of Daily Living In your present state of health, do you have any difficulty performing the following activities: 03/06/2020 07/24/2019  Hearing? N N  Vision? N N  Difficulty concentrating or making decisions? N N  Walking or climbing stairs? N N  Dressing or bathing? N N  Doing errands, shopping? N N  Preparing Food and eating ? N -  Using the Toilet? N -  In the past six months, have you accidently leaked urine? N -  Do you have problems with loss of bowel control? N -  Managing your Medications? N -  Managing your Finances? N -  Housekeeping or managing your Housekeeping? N -  Some recent data might be hidden    Patient Care Team: Leone Haven, MD as PCP - General (Family Medicine) Minna Merritts, MD as PCP - Cardiology (Cardiology)  Indicate any recent Medical Services you may have received from other than [facility] providers in the past year (date may be approximate).     Assessment:   This is a routine wellness examination for [place].  I connected with Autry today by telephone and verified that I am speaking with the correct person using two identifiers. Location patient:  home Location provider: work Persons participating in the virtual visit: patient, Marine scientist.    I discussed the limitations, risks, security and privacy concerns of performing an evaluation and management service by telephone and the availability of in person appointments. The patient expressed understanding and verbally consented to this telephonic visit.    Interactive audio and video telecommunications were attempted between this provider and patient, however failed, due to patient having technical difficulties OR patient did not have access to video capability.  We continued and completed visit with audio only.  Some vital signs may be absent or patient reported.   Hearing/Vision screen No exam data present  Dietary issues and exercise activities discussed:    Goals      Patient Stated   .  DIET - INCREASE WATER INTAKE (pt-stated)    .  Walk more for exercise (pt-stated)      Depression Screen PHQ 2/9 Scores 03/06/2020 01/23/2020 10/13/2019 06/16/2019 05/22/2019 02/17/2019 06/17/2018  PHQ - 2 Score 0 0 0 0 0 0 0    Fall Risk Fall Risk  01/23/2020 10/13/2019 07/17/2019 05/22/2019 02/17/2019  Falls in the past year? 0 0 0 0 0  Number falls in past yr: 0 0 - 0 -  Injury with Fall? 0 - - 0 -  Follow up Falls evaluation completed Falls evaluation completed Falls evaluation completed Falls evaluation completed -   Handrails in use when climbing stairs? Yes Home free of loose throw rugs in walkways, pet beds, electrical cords, etc? Yes  Adequate lighting in your home to reduce risk of falls? Yes   ASSISTIVE DEVICES UTILIZED TO PREVENT FALLS: Life alert? No  Use of a cane, walker or w/c? No  Grab bars in the bathroom? No  Shower chair or bench in shower? No  Elevated toilet seat or a handicapped toilet? Yes   TIMED UP AND GO: Was the test performed? No . Virtual visit.    Cognitive Function: MMSE - Mini Mental State Exam 02/02/2018  Orientation to time 5  Orientation to Place 5   Registration 3  Attention/ Calculation  5  Recall 3  Language- name 2 objects 2  Language- repeat 1  Language- follow 3 step command 3  Language- read & follow direction 1  Write a sentence 1  Copy design 1  Total score 30     6CIT Screen 02/17/2019  What Year? 0 points  What month? 0 points  What time? 0 points  Count back from 20 0 points  Months in reverse 0 points  Repeat phrase 0 points  Total Score 0   Immunizations Immunization History  Administered Date(s) Administered  . Fluad Quad(high Dose 65+) 02/20/2019  . Influenza, High Dose Seasonal PF 03/12/2017, 03/31/2018  . PFIZER SARS-COV-2 Vaccination 10/27/2019, 12/01/2019   TDAP status: Due, Education has been provided regarding the importance of this vaccine. Advised may receive this vaccine at local pharmacy or Health Dept. Aware to provide a copy of the vaccination record if obtained from local pharmacy or Health Dept. Verbalized acceptance and understanding. Deferred.   Health Maintenance Health Maintenance  Topic Date Due  . INFLUENZA VACCINE  09/05/2020 (Originally 01/07/2020)  . TETANUS/TDAP  03/06/2021 (Originally 08/30/1964)  . Fecal DNA (Cologuard)  03/06/2021 (Originally 08/31/1995)  . PNA vac Low Risk Adult (1 of 2 - PCV13) 03/06/2021 (Originally 08/31/2010)  . COVID-19 Vaccine  Completed  . Hepatitis C Screening  Completed    Influenza vaccine- plans top schedule alter in the season. Deferred.   Cologuard- declined.   Pneumococcal vaccine- plans to schedule later in the season. Deferred.   Dental Screening: Recommended annual dental exams for proper oral hygiene.  Community Resource Referral / Chronic Care Management: CRR required this visit?  No   CCM required this visit?  No      Plan:   Keep all routine maintenance appointments.   Next scheduled lab 03/07/20 @ 11:15  I have personally reviewed and noted the following in the patient's chart:   . Medical and social history . Use of  alcohol, tobacco or illicit drugs  . Current medications and supplements . Functional ability and status . Nutritional status . Physical activity . Advanced directives . List of other physicians . Hospitalizations, surgeries, and ER visits in previous 12 months . Vitals . Screenings to include cognitive, depression, and falls . Referrals and appointments  In addition, I have reviewed and discussed with patient certain preventive protocols, quality metrics, and best practice recommendations. A written personalized care plan for preventive services as well as general preventive health recommendations were provided to patient via mail.     Varney Biles, LPN   [phone]

## 2020-03-07 ENCOUNTER — Other Ambulatory Visit (INDEPENDENT_AMBULATORY_CARE_PROVIDER_SITE_OTHER): Payer: PPO

## 2020-03-07 ENCOUNTER — Other Ambulatory Visit: Payer: Self-pay

## 2020-03-07 DIAGNOSIS — R7989 Other specified abnormal findings of blood chemistry: Secondary | ICD-10-CM

## 2020-03-07 DIAGNOSIS — Z23 Encounter for immunization: Secondary | ICD-10-CM | POA: Diagnosis not present

## 2020-03-07 DIAGNOSIS — N179 Acute kidney failure, unspecified: Secondary | ICD-10-CM | POA: Diagnosis not present

## 2020-03-07 LAB — BASIC METABOLIC PANEL
BUN: 10 mg/dL (ref 6–23)
CO2: 32 mEq/L (ref 19–32)
Calcium: 9.4 mg/dL (ref 8.4–10.5)
Chloride: 102 mEq/L (ref 96–112)
Creatinine, Ser: 1.09 mg/dL (ref 0.40–1.50)
GFR: 66.04 mL/min (ref >60.00)
Glucose, Bld: 92 mg/dL (ref 70–99)
Potassium: 4.3 mEq/L (ref 3.5–5.1)
Sodium: 140 mEq/L (ref 135–145)

## 2020-03-07 LAB — T3, FREE: T3, Free: 3.4 pg/mL (ref 2.3–4.2)

## 2020-03-07 LAB — T4, FREE: Free T4: 0.87 ng/dL (ref 0.60–1.60)

## 2020-03-07 LAB — TSH: TSH: 0.5 u[IU]/mL (ref 0.35–4.50)

## 2020-03-11 ENCOUNTER — Encounter: Payer: Self-pay | Admitting: Family Medicine

## 2020-03-11 ENCOUNTER — Other Ambulatory Visit: Payer: Self-pay | Admitting: Family Medicine

## 2020-04-18 ENCOUNTER — Other Ambulatory Visit: Payer: Self-pay

## 2020-04-18 ENCOUNTER — Encounter: Payer: Self-pay | Admitting: Dermatology

## 2020-04-18 ENCOUNTER — Ambulatory Visit: Payer: PPO | Admitting: Dermatology

## 2020-04-18 DIAGNOSIS — L578 Other skin changes due to chronic exposure to nonionizing radiation: Secondary | ICD-10-CM | POA: Diagnosis not present

## 2020-04-18 DIAGNOSIS — D229 Melanocytic nevi, unspecified: Secondary | ICD-10-CM | POA: Diagnosis not present

## 2020-04-18 DIAGNOSIS — D1800 Hemangioma unspecified site: Secondary | ICD-10-CM | POA: Diagnosis not present

## 2020-04-18 DIAGNOSIS — L570 Actinic keratosis: Secondary | ICD-10-CM

## 2020-04-18 DIAGNOSIS — L821 Other seborrheic keratosis: Secondary | ICD-10-CM | POA: Diagnosis not present

## 2020-04-18 DIAGNOSIS — L853 Xerosis cutis: Secondary | ICD-10-CM | POA: Diagnosis not present

## 2020-04-18 DIAGNOSIS — Z1283 Encounter for screening for malignant neoplasm of skin: Secondary | ICD-10-CM

## 2020-04-18 DIAGNOSIS — Z85820 Personal history of malignant melanoma of skin: Secondary | ICD-10-CM | POA: Diagnosis not present

## 2020-04-18 DIAGNOSIS — L814 Other melanin hyperpigmentation: Secondary | ICD-10-CM | POA: Diagnosis not present

## 2020-04-18 DIAGNOSIS — B353 Tinea pedis: Secondary | ICD-10-CM | POA: Diagnosis not present

## 2020-04-18 NOTE — Patient Instructions (Addendum)
Instructions for [facility] Medications  One or more of your medications was sent to the [facility] mail order compounding pharmacy. You will receive an email from them and can purchase the medicine through that link. It will then be mailed to your home at the address you confirmed. If for any reason you do not receive an email from them, please check your spam folder. If you still do not find the email, please let us know. [facility] phone number is [phone].    In January 2022 start the 5FU/Calcipotriene cream 2 times a day for 7 days to forehead and scalp, this cream will come from [facility]

## 2020-04-18 NOTE — Progress Notes (Signed)
Follow-Up Visit   Subjective  Karl Myers is a 74 y.o. male who presents for the following: Annual Exam (Total body skin exam, 49m f/u, hx of melanoma) and tinea pedis (bil feet, pt has used Ketoconazole 2% cream, but not using now). The patient presents for Total-Body Skin Exam (TBSE) for skin cancer screening and mole check.  The following portions of the chart were reviewed this encounter and updated as appropriate:  Tobacco  Allergies  Meds  Problems  Med Hx  Surg Hx  Fam Hx     Review of Systems:  No other skin or systemic complaints except as noted in HPI or Assessment and Plan.  Objective  Well appearing patient in no apparent distress; mood and affect are within normal limits.  A full examination was performed including scalp, head, eyes, ears, nose, lips, neck, chest, axillae, abdomen, back, buttocks, bilateral upper extremities, bilateral lower extremities, hands, feet, fingers, toes, fingernails, and toenails. All findings within normal limits unless otherwise noted below.  Objective  Right 5th toe lat tip: Well healed scar with no evidence of recurrence, no lymphadenopathy.   Objective  face/scalp x 12 (12): Pink scaly macules   Objective  bil feet: Scaling and maceration web spaces and over distal and lateral soles.    Assessment & Plan    Lentigines - Scattered tan macules - Discussed due to sun exposure - Benign, observe - Call for any changes  Seborrheic Keratoses - Stuck-on, waxy, tan-brown papules and plaques  - Discussed benign etiology and prognosis. - Observe - Call for any changes  Melanocytic Nevi - Tan-brown and/or pink-flesh-colored symmetric macules and papules - Benign appearing on exam today - Observation - Call clinic for new or changing moles - Recommend daily use of broad spectrum spf 30+ sunscreen to sun-exposed areas.   Hemangiomas - Red papules - Discussed benign nature - Observe - Call for any changes  Actinic  Damage - Chronic, secondary to cumulative UV/sun exposure - diffuse scaly erythematous macules with underlying dyspigmentation - Recommend daily broad spectrum sunscreen SPF 30+ to sun-exposed areas, reapply every 2 hours as needed.  - Call for new or changing lesions.  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  The purpose is to decrease the number of clinically evident and subclinical PreCancerous lesions to prevent progression to development of skin cancer by chemically destroying early precancer changes that may or may not be visible.  It has been shown to reduce the risk of developing skin cancer in the treated area. As a result of treatment, redness, scaling, crusting, and open sores may occur during treatment course. One or more than one of these methods may be used and may have to be used several times to control, suppress and eliminate the PreCancerous changes. Discussed treatment course, expected reaction, and possible side effects.  Skin cancer screening performed today.  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  No lymphadenopathy.  Observe for recurrence. Call clinic for new or changing lesions.  Recommend regular skin exams, daily broad-spectrum spf  30+ sunscreen use, and photoprotection.     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  In January start Skin medicinals 5FU/Calcipotriene cream bid to forehead and scalp x 1wk, 30g 2rf  Destruction of lesion - face/scalp x 12 Complexity: simple   Destruction method: cryotherapy   Informed consent: discussed and consent obtained   Timeout:  patient name, date of birth, surgical site, and procedure verified Lesion destroyed using liquid nitrogen: Yes   Region frozen until ice ball extended beyond lesion: Yes   Outcome: patient tolerated procedure well with no complications   Post-procedure details: wound care instructions given    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  I, Sonya Hupman, RMA, am acting as scribe for [name], MD .  Documentation: I have reviewed the above documentation for accuracy and completeness, and I agree with the above.  [name], MD

## 2020-04-23 ENCOUNTER — Encounter: Payer: Self-pay | Admitting: Dermatology

## 2020-04-25 DIAGNOSIS — M48062 Spinal stenosis, lumbar region with neurogenic claudication: Secondary | ICD-10-CM | POA: Diagnosis not present

## 2020-04-25 DIAGNOSIS — M5136 Other intervertebral disc degeneration, lumbar region: Secondary | ICD-10-CM | POA: Diagnosis not present

## 2020-04-25 DIAGNOSIS — M5416 Radiculopathy, lumbar region: Secondary | ICD-10-CM | POA: Diagnosis not present

## 2020-05-09 ENCOUNTER — Other Ambulatory Visit: Payer: Self-pay | Admitting: Family Medicine

## 2020-05-23 DIAGNOSIS — M5416 Radiculopathy, lumbar region: Secondary | ICD-10-CM | POA: Diagnosis not present

## 2020-05-23 DIAGNOSIS — M48062 Spinal stenosis, lumbar region with neurogenic claudication: Secondary | ICD-10-CM | POA: Diagnosis not present

## 2020-05-23 DIAGNOSIS — M5136 Other intervertebral disc degeneration, lumbar region: Secondary | ICD-10-CM | POA: Diagnosis not present

## 2020-06-04 ENCOUNTER — Other Ambulatory Visit: Payer: Self-pay | Admitting: Family Medicine

## 2020-06-04 NOTE — Telephone Encounter (Signed)
Single refill sent to pharmacy.  The patient needs follow-up for further refills.  Please call the patient and get him scheduled for a follow-up visit for the next available appointment.  Thanks.

## 2020-06-11 ENCOUNTER — Other Ambulatory Visit: Payer: Self-pay

## 2020-06-14 ENCOUNTER — Encounter: Payer: Self-pay | Admitting: Family Medicine

## 2020-06-14 ENCOUNTER — Ambulatory Visit (INDEPENDENT_AMBULATORY_CARE_PROVIDER_SITE_OTHER): Payer: PPO | Admitting: Family Medicine

## 2020-06-14 ENCOUNTER — Other Ambulatory Visit: Payer: Self-pay

## 2020-06-14 VITALS — BP 140/70 | HR 59 | Temp 97.4°F | Ht 68.0 in | Wt 169.6 lb

## 2020-06-14 DIAGNOSIS — F419 Anxiety disorder, unspecified: Secondary | ICD-10-CM

## 2020-06-14 DIAGNOSIS — Z720 Tobacco use: Secondary | ICD-10-CM | POA: Diagnosis not present

## 2020-06-14 DIAGNOSIS — I10 Essential (primary) hypertension: Secondary | ICD-10-CM | POA: Diagnosis not present

## 2020-06-14 DIAGNOSIS — G479 Sleep disorder, unspecified: Secondary | ICD-10-CM | POA: Diagnosis not present

## 2020-06-14 DIAGNOSIS — I480 Paroxysmal atrial fibrillation: Secondary | ICD-10-CM | POA: Diagnosis not present

## 2020-06-14 DIAGNOSIS — E782 Mixed hyperlipidemia: Secondary | ICD-10-CM

## 2020-06-14 LAB — LIPID PANEL
Cholesterol: 146 mg/dL (ref 0–200)
HDL: 44.3 mg/dL (ref >39.00)
LDL Cholesterol: 84 mg/dL (ref 0–99)
NonHDL: 101.63
Total CHOL/HDL Ratio: 3
Triglycerides: 89 mg/dL (ref 0.0–149.0)
VLDL: 17.8 mg/dL (ref 0.0–40.0)

## 2020-06-14 NOTE — Assessment & Plan Note (Signed)
Stable.  He can continue Xanax 0.5 mg nightly as needed and trazodone 150 mg nightly.

## 2020-06-14 NOTE — Patient Instructions (Signed)
Nice to see you. We will check lab work today. Please take your Eliquis twice daily as prescribed. Please consider quitting smoking.

## 2020-06-14 NOTE — Progress Notes (Signed)
Karl Rumps, MD Phone: [phone]  Karl Myers is a 75 y.o. male who presents today for follow-up.  Sleeping difficulty: Patient takes Xanax and trazodone for this.  This has been a chronic regimen that has been beneficial.  He gets 6 to 7 hours of sleep with this.  No drowsiness.  No anxiety or depression.  Hypertension: Typically less than 140/80.  Taking atenolol and lisinopril.  No chest pain, shortness breath, or edema.  Atrial fibrillation: He has only been taking Eliquis once daily due to cost concerns though he does note he can afford it.  No palpitations.  No bleeding issues.  Tobacco abuse: Patient continues to smoke about a pack a day.  He is not ready to quit smoking.  He understands the risk of continued tobacco use.  Social History   Tobacco Use  Smoking Status Current Every Day Smoker  . Packs/day: 1.00  Smokeless Tobacco Never Used    Current Outpatient Medications on File Prior to Visit  Medication Sig Dispense Refill  . ALPRAZolam (XANAX) 0.5 MG tablet TAKE ONE-HALF TABLET BY MOUTH EVERY MORNING AND TAKE ONE TABLET BY MOUTH AT BEDTIME AS DIRECTED 45 tablet 0  . atenolol (TENORMIN) 25 MG tablet TAKE ONE TABLET AT BEDTIME 90 tablet 3  . atorvastatin (LIPITOR) 40 MG tablet TAKE 1 TABLET BY MOUTH DAILY 90 tablet 3  . cholecalciferol (VITAMIN D3) 25 MCG (1000 UNIT) tablet Take 1,000 Units by mouth daily.     Marland Kitchen ELIQUIS 5 MG TABS tablet TAKE ONE TABLET TWICE DAILY 180 tablet 1  . gabapentin (NEURONTIN) 100 MG capsule Take by mouth.    Marland Kitchen lisinopril (ZESTRIL) 10 MG tablet TAKE ONE TABLET AT BEDTIME 90 tablet 3  . nitroGLYCERIN (NITROSTAT) 0.4 MG SL tablet Place 1 tablet (0.4 mg total) under the tongue every 5 (five) minutes as needed for chest pain. For up to 3 doses per episode. 50 tablet 0  . tiZANidine (ZANAFLEX) 4 MG tablet Take 1 tablet (4 mg total) by mouth every 6 (six) hours as needed for muscle spasms. 30 tablet 0  . traMADol (ULTRAM) 50 MG tablet Take  by mouth.    . traZODone (DESYREL) 150 MG tablet TAKE ONE TABLET AT BEDTIME 90 tablet 1  . vitamin C (ASCORBIC ACID) 250 MG tablet Take 250 mg by mouth daily.     . Zinc 100 MG TABS Take 100 mg by mouth daily.     . benzonatate (TESSALON) 200 MG capsule Take 1 capsule (200 mg total) by mouth 2 (two) times daily as needed for cough. (Patient not taking: Reported on 06/14/2020) 20 capsule 0  . escitalopram (LEXAPRO) 5 MG tablet TAKE ONE TABLET BY MOUTH EVERY DAY (Patient not taking: Reported on 06/14/2020) 90 tablet 1  . ketoconazole (NIZORAL) 2 % cream Apply to the feet and between the toes QHS. (Patient not taking: Reported on 06/14/2020) 60 g 3  . mupirocin ointment (BACTROBAN) 2 % Apply 1 application topically daily. With dressing changes (Patient not taking: Reported on 06/14/2020) 22 g 0  . nicotine (NICODERM CQ - DOSED IN MG/24 HOURS) 21 mg/24hr patch Place 1 patch (21 mg total) onto the skin daily. For 6 weeks. Call for next step once on the last week. (Patient not taking: Reported on 06/14/2020) 42 patch 0   Current Facility-Administered Medications on File Prior to Visit  Medication Dose Route Frequency Provider Last Rate Last Admin  . oxyCODONE (Oxy IR/ROXICODONE) immediate release tablet 5 mg  5 mg  Oral Q3H PRN Hessie Knows, MD         ROS see history of present illness  Objective  Physical Exam Vitals:   06/14/20 0937  BP: 140/70  Pulse: (!) 59  Temp: (!) 97.4 F (36.3 C)    BP Readings from Last 3 Encounters:  06/14/20 140/70  10/13/19 128/70  08/23/19 100/60   Wt Readings from Last 3 Encounters:  06/14/20 169 lb 9.6 oz (76.9 kg)  03/06/20 167 lb (75.8 kg)  01/23/20 163 lb (73.9 kg)    Physical Exam Constitutional:      General: He is not in acute distress.    Appearance: He is not diaphoretic.  Cardiovascular:     Rate and Rhythm: Normal rate and regular rhythm.     Heart sounds: Normal heart sounds.  Pulmonary:     Effort: Pulmonary effort is normal.     Breath  sounds: Normal breath sounds.  Musculoskeletal:        General: No edema.     Right lower leg: No edema.     Left lower leg: No edema.  Skin:    General: Skin is warm and dry.  Neurological:     Mental Status: He is alert.      Assessment/Plan: Please see individual problem list.  Problem List Items Addressed This Visit    Anxiety    Asymptomatic.  He will monitor for recurrence.      Atrial fibrillation (HCC)    Sinus rhythm today.  Asymptomatic.  I advised he needs to take his Eliquis twice daily for this to be beneficial in preventing a stroke.  He verbalized understanding and noted he would start taking it twice daily.      Hyperlipidemia - Primary    Continue Lipitor 40 mg once daily.  Check lipid panel.      Relevant Orders   Lipid panel   Hypertension    Adequate control for age.  He will continue with atenolol 25 mg daily and lisinopril 10 mg daily.      Sleeping difficulty    Stable.  He can continue Xanax 0.5 mg nightly as needed and trazodone 150 mg nightly.      Tobacco abuse    I advised that the patient should quit smoking tobacco products.  He understands the risk of cancer, heart attack, and stroke with continued use.          This visit occurred during the SARS-CoV-2 public health emergency.  Safety protocols were in place, including screening questions prior to the visit, additional usage of staff PPE, and extensive cleaning of exam room while observing appropriate contact time as indicated for disinfecting solutions.    Karl Rumps, MD [place]

## 2020-06-14 NOTE — Assessment & Plan Note (Signed)
Continue Lipitor 40 mg once daily.  Check lipid panel.

## 2020-06-14 NOTE — Assessment & Plan Note (Signed)
Asymptomatic.  He will monitor for recurrence. 

## 2020-06-14 NOTE — Assessment & Plan Note (Signed)
Sinus rhythm today.  Asymptomatic.  I advised he needs to take his Eliquis twice daily for this to be beneficial in preventing a stroke.  He verbalized understanding and noted he would start taking it twice daily.

## 2020-06-14 NOTE — Assessment & Plan Note (Signed)
I advised that the patient should quit smoking tobacco products.  He understands the risk of cancer, heart attack, and stroke with continued use.

## 2020-06-14 NOTE — Assessment & Plan Note (Signed)
Adequate control for age.  He will continue with atenolol 25 mg daily and lisinopril 10 mg daily.

## 2020-06-18 NOTE — Progress Notes (Signed)
I called and spoke with the patient and he is okay with the increase in lipitor to 80 mg and he is scheduled for labs in 6 weeks.  Terriona Horlacher,cm a

## 2020-06-19 ENCOUNTER — Other Ambulatory Visit: Payer: Self-pay | Admitting: Family Medicine

## 2020-06-19 DIAGNOSIS — E785 Hyperlipidemia, unspecified: Secondary | ICD-10-CM

## 2020-06-24 ENCOUNTER — Other Ambulatory Visit: Payer: Self-pay | Admitting: Family Medicine

## 2020-06-24 DIAGNOSIS — E785 Hyperlipidemia, unspecified: Secondary | ICD-10-CM

## 2020-06-24 MED ORDER — ATORVASTATIN CALCIUM 80 MG PO TABS: 80.0000 mg | ORAL_TABLET | Freq: Every day | ORAL | 1 refills | Status: DC

## 2020-07-03 ENCOUNTER — Ambulatory Visit: Payer: PPO | Admitting: Dermatology

## 2020-07-03 ENCOUNTER — Other Ambulatory Visit: Payer: Self-pay

## 2020-07-03 DIAGNOSIS — L239 Allergic contact dermatitis, unspecified cause: Secondary | ICD-10-CM | POA: Diagnosis not present

## 2020-07-03 MED ORDER — TRIAMCINOLONE ACETONIDE 0.1 % EX OINT
TOPICAL_OINTMENT | CUTANEOUS | 0 refills | Status: DC
Start: 2020-07-03 — End: 2021-05-23

## 2020-07-03 NOTE — Progress Notes (Unsigned)
   Follow-Up Visit   Subjective  Karl Myers is a 75 y.o. male who presents for the following: Rash (Pt c/o pink rash on the L side of his neck x 1 week, will not go away ). The following portions of the chart were reviewed this encounter and updated as appropriate:   Tobacco  Allergies  Meds  Problems  Med Hx  Surg Hx  Fam Hx     Review of Systems:  No other skin or systemic complaints except as noted in HPI or Assessment and Plan.  Objective  Well appearing patient in no apparent distress; mood and affect are within normal limits.  A focused examination was performed including face,neck . Relevant physical exam findings are noted in the Assessment and Plan.  Objective  left neck: Erythema and scale    Assessment & Plan  Allergic contact dermatitis, unspecified trigger left neck  Contact dermatitis vs Eczema  Chronic and persistent   Start Triamcinolone 0.1% ointment apply to skin bid x 2 weeks   If not gone in 2 weeks Return to the office   Ordered Medications: triamcinolone ointment (KENALOG) 0.1 %  Return for as scheduled March 2022.  IMarye Round, CMA, am acting as scribe for [name], MD .  Documentation: I have reviewed the above documentation for accuracy and completeness, and I agree with the above.  [name], MD

## 2020-07-03 NOTE — Patient Instructions (Signed)

## 2020-07-05 ENCOUNTER — Encounter: Payer: Self-pay | Admitting: Dermatology

## 2020-07-08 ENCOUNTER — Other Ambulatory Visit: Payer: Self-pay | Admitting: Family Medicine

## 2020-08-01 ENCOUNTER — Other Ambulatory Visit: Payer: Self-pay | Admitting: Family Medicine

## 2020-08-05 ENCOUNTER — Other Ambulatory Visit: Payer: Self-pay

## 2020-08-05 ENCOUNTER — Telehealth: Payer: Self-pay

## 2020-08-05 ENCOUNTER — Other Ambulatory Visit (INDEPENDENT_AMBULATORY_CARE_PROVIDER_SITE_OTHER): Payer: PPO

## 2020-08-05 DIAGNOSIS — E785 Hyperlipidemia, unspecified: Secondary | ICD-10-CM | POA: Diagnosis not present

## 2020-08-05 LAB — LDL CHOLESTEROL, DIRECT: Direct LDL: 61 mg/dL

## 2020-08-05 LAB — HEPATIC FUNCTION PANEL
ALT: 14 U/L (ref 0–53)
AST: 16 U/L (ref 0–37)
Albumin: 4.3 g/dL (ref 3.5–5.2)
Alkaline Phosphatase: 75 U/L (ref 39–117)
Bilirubin, Direct: 0.1 mg/dL (ref 0.0–0.3)
Total Bilirubin: 0.6 mg/dL (ref 0.2–1.2)
Total Protein: 6.2 g/dL (ref 6.0–8.3)

## 2020-08-05 NOTE — Telephone Encounter (Signed)
Medication was called in to [facility].  Nickolaos Brallier,cma

## 2020-08-05 NOTE — Telephone Encounter (Signed)
Pt states that [facility] told him that they do not have rx refill for ALPRAZolam (XANAX) 0.5 MG tablet. Please resend

## 2020-08-05 NOTE — Telephone Encounter (Signed)
Can you call his prescription in with one refill? Thanks.

## 2020-08-13 ENCOUNTER — Other Ambulatory Visit: Payer: Self-pay

## 2020-08-13 ENCOUNTER — Encounter: Payer: Self-pay | Admitting: Dermatology

## 2020-08-13 ENCOUNTER — Ambulatory Visit: Payer: PPO | Admitting: Dermatology

## 2020-08-13 DIAGNOSIS — D229 Melanocytic nevi, unspecified: Secondary | ICD-10-CM | POA: Diagnosis not present

## 2020-08-13 DIAGNOSIS — Z85820 Personal history of malignant melanoma of skin: Secondary | ICD-10-CM

## 2020-08-13 DIAGNOSIS — L570 Actinic keratosis: Secondary | ICD-10-CM

## 2020-08-13 DIAGNOSIS — D1800 Hemangioma unspecified site: Secondary | ICD-10-CM | POA: Diagnosis not present

## 2020-08-13 DIAGNOSIS — L820 Inflamed seborrheic keratosis: Secondary | ICD-10-CM | POA: Diagnosis not present

## 2020-08-13 DIAGNOSIS — L814 Other melanin hyperpigmentation: Secondary | ICD-10-CM | POA: Diagnosis not present

## 2020-08-13 DIAGNOSIS — L821 Other seborrheic keratosis: Secondary | ICD-10-CM

## 2020-08-13 DIAGNOSIS — Z1283 Encounter for screening for malignant neoplasm of skin: Secondary | ICD-10-CM | POA: Diagnosis not present

## 2020-08-13 DIAGNOSIS — L578 Other skin changes due to chronic exposure to nonionizing radiation: Secondary | ICD-10-CM

## 2020-08-13 NOTE — Progress Notes (Signed)
Follow-Up Visit   Subjective  Karl Myers is a 75 y.o. male who presents for the following: Annual Exam (Hx MM, AK's - patient has bumps on the L neck that didn't resolve with [facility] 0.1% ointment and he would like them checked today ). The patient presents for Total-Body Skin Exam (TBSE) for skin cancer screening and mole check.  The following portions of the chart were reviewed this encounter and updated as appropriate:   Tobacco  Allergies  Meds  Problems  Med Hx  Surg Hx  Fam Hx     Review of Systems:  No other skin or systemic complaints except as noted in HPI or Assessment and Plan.  Objective  Well appearing patient in no apparent distress; mood and affect are within normal limits.  A full examination was performed including scalp, head, eyes, ears, nose, lips, neck, chest, axillae, abdomen, back, buttocks, bilateral upper extremities, bilateral lower extremities, hands, feet, fingers, toes, fingernails, and toenails. All findings within normal limits unless otherwise noted below.  Objective  Face, scalp, ears x 11 (11): Erythematous thin papules/macules with gritty scale.   Objective  L lat neck x 1: Erythematous keratotic or waxy stuck-on papule or plaque.   Assessment & Plan  AK (actinic keratosis) (11) Face, scalp, ears x 11  Destruction of lesion - Face, scalp, ears x 11 Complexity: simple   Destruction method: cryotherapy   Informed consent: discussed and consent obtained   Timeout:  patient name, date of birth, surgical site, and procedure verified Lesion destroyed using liquid nitrogen: Yes   Region frozen until ice ball extended beyond lesion: Yes   Outcome: patient tolerated procedure well with no complications   Post-procedure details: wound care instructions given    Inflamed seborrheic keratosis L lat neck x 1  Destruction of lesion - L lat neck x 1 Complexity: simple   Destruction method: cryotherapy   Informed consent: discussed and consent  obtained   Timeout:  patient name, date of birth, surgical site, and procedure verified Lesion destroyed using liquid nitrogen: Yes   Region frozen until ice ball extended beyond lesion: Yes   Outcome: patient tolerated procedure well with no complications   Post-procedure details: wound care instructions given    Skin cancer screening   Lentigines - Scattered tan macules - Due to sun exposure - Benign-appering, observe - Recommend daily broad spectrum sunscreen SPF 30+ to sun-exposed areas, reapply every 2 hours as needed. - Call for any changes  Seborrheic Keratoses - Stuck-on, waxy, tan-brown papules and plaques  - Discussed benign etiology and prognosis. - Observe - Call for any changes  Melanocytic Nevi - Tan-brown and/or pink-flesh-colored symmetric macules and papules - Benign appearing on exam today - Observation - Call clinic for new or changing moles - Recommend daily use of broad spectrum spf 30+ sunscreen to sun-exposed areas.   Hemangiomas - Red papules - Discussed benign nature - Observe - Call for any changes  Actinic Damage - Chronic, secondary to cumulative UV/sun exposure - diffuse scaly erythematous macules with underlying dyspigmentation - Recommend daily broad spectrum sunscreen SPF 30+ to sun-exposed areas, reapply every 2 hours as needed.  - Call for new or changing lesions.  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  -  Call if any new or changing lesions are noted between office visits  Skin cancer screening performed today.  Return in about 4 months (around 12/13/2020) for TBSE - Hx MM, AK's .  Luther Redo, CMA, am acting as scribe for [name], MD .  Documentation: I have reviewed the  above documentation for accuracy and completeness, and I agree with the above.  [name], MD

## 2020-08-17 ENCOUNTER — Encounter: Payer: Self-pay | Admitting: Family Medicine

## 2020-09-10 ENCOUNTER — Other Ambulatory Visit: Payer: Self-pay | Admitting: Cardiovascular Disease

## 2020-09-10 DIAGNOSIS — I480 Paroxysmal atrial fibrillation: Secondary | ICD-10-CM

## 2020-09-11 NOTE — Telephone Encounter (Signed)
82m, 76.9kg, scr 1.09 03/07/20, lovw/gollan 08/23/19 (OVERDUE) will send refill for eliquis with note that appt needed for further refills and give the pt a 1 month supply

## 2020-10-14 ENCOUNTER — Other Ambulatory Visit: Payer: Self-pay | Admitting: Family Medicine

## 2020-10-14 DIAGNOSIS — E785 Hyperlipidemia, unspecified: Secondary | ICD-10-CM

## 2020-10-16 DIAGNOSIS — H26493 Other secondary cataract, bilateral: Secondary | ICD-10-CM | POA: Diagnosis not present

## 2020-12-06 ENCOUNTER — Other Ambulatory Visit: Payer: Self-pay | Admitting: Family Medicine

## 2020-12-13 ENCOUNTER — Ambulatory Visit: Payer: PPO | Admitting: Family Medicine

## 2020-12-17 ENCOUNTER — Ambulatory Visit (INDEPENDENT_AMBULATORY_CARE_PROVIDER_SITE_OTHER): Payer: PPO | Admitting: Family Medicine

## 2020-12-17 ENCOUNTER — Encounter: Payer: Self-pay | Admitting: Family Medicine

## 2020-12-17 ENCOUNTER — Other Ambulatory Visit: Payer: Self-pay

## 2020-12-17 ENCOUNTER — Other Ambulatory Visit: Payer: Self-pay | Admitting: Family Medicine

## 2020-12-17 VITALS — BP 130/80 | HR 64 | Temp 97.4°F | Ht 68.0 in | Wt 166.0 lb

## 2020-12-17 DIAGNOSIS — I10 Essential (primary) hypertension: Secondary | ICD-10-CM

## 2020-12-17 DIAGNOSIS — I480 Paroxysmal atrial fibrillation: Secondary | ICD-10-CM | POA: Diagnosis not present

## 2020-12-17 DIAGNOSIS — Z85820 Personal history of malignant melanoma of skin: Secondary | ICD-10-CM

## 2020-12-17 DIAGNOSIS — G479 Sleep disorder, unspecified: Secondary | ICD-10-CM | POA: Diagnosis not present

## 2020-12-17 LAB — BASIC METABOLIC PANEL
BUN: 13 mg/dL (ref 6–23)
CO2: 30 mEq/L (ref 19–32)
Calcium: 9.4 mg/dL (ref 8.4–10.5)
Chloride: 101 mEq/L (ref 96–112)
Creatinine, Ser: 1.17 mg/dL (ref 0.40–1.50)
GFR: 61.07 mL/min (ref >60.00)
Glucose, Bld: 87 mg/dL (ref 70–99)
Potassium: 4.3 mEq/L (ref 3.5–5.1)
Sodium: 139 mEq/L (ref 135–145)

## 2020-12-17 NOTE — Assessment & Plan Note (Signed)
Adequate control for age.  He will continue lisinopril 10 mg once daily and atenolol 25 mg daily.  Check BMP.

## 2020-12-17 NOTE — Progress Notes (Signed)
Tommi Rumps, MD Phone: [phone]  Karl Myers is a 75 y.o. male who presents today for f/u.  HYPERTENSION Disease Monitoring Home BP Monitoring 135-145/80 Chest pain- no    Dyspnea- no Medications Compliance-  taking lisinopril, atenolol.  Edema- no  Atrial fibrillation: Taking Eliquis and atenolol.  No palpitations or bleeding issues.  Insomnia: Continues on trazodone and Xanax.  He notes no drowsiness with this.  No alcohol intake.  He reports 6 to 7 hours of sleep nightly with this regimen.  History of melanoma: He continues to follow with dermatology.  He sees them later this month.   Social History   Tobacco Use  Smoking Status Every Day   Packs/day: 1.00   Pack years: 0.00   Types: Cigarettes  Smokeless Tobacco Never    Current Outpatient Medications on File Prior to Visit  Medication Sig Dispense Refill   ALPRAZolam (XANAX) 0.5 MG tablet TAKE ONE-HALF TABLET BY MOUTH EVERY MORNING AND TAKE ONE TABLET BY MOUTH AT BEDTIME AS DIRECTED 45 tablet 1   apixaban (ELIQUIS) 5 MG TABS tablet Take 1 tablet (5 mg total) by mouth 2 (two) times daily. APPOINTMENT NEEDED FOR FURTHER REFILLS 60 tablet 0   atenolol (TENORMIN) 25 MG tablet TAKE ONE TABLET AT BEDTIME 90 tablet 3   atorvastatin (LIPITOR) 80 MG tablet TAKE 1 TABLET BY MOUTH DAILY. 90 tablet 1   benzonatate (TESSALON) 200 MG capsule Take 1 capsule (200 mg total) by mouth 2 (two) times daily as needed for cough. 20 capsule 0   cholecalciferol (VITAMIN D3) 25 MCG (1000 UNIT) tablet Take 1,000 Units by mouth daily.      escitalopram (LEXAPRO) 5 MG tablet TAKE ONE TABLET BY MOUTH EVERY DAY 90 tablet 1   gabapentin (NEURONTIN) 100 MG capsule Take by mouth.     ketoconazole (NIZORAL) 2 % cream Apply to the feet and between the toes QHS. 60 g 3   lisinopril (ZESTRIL) 10 MG tablet TAKE ONE TABLET AT BEDTIME 90 tablet 3   mupirocin ointment (BACTROBAN) 2 % Apply 1 application topically daily. With dressing changes 22 g  0   nicotine (NICODERM CQ - DOSED IN MG/24 HOURS) 21 mg/24hr patch Place 1 patch (21 mg total) onto the skin daily. For 6 weeks. Call for next step once on the last week. 42 patch 0   nitroGLYCERIN (NITROSTAT) 0.4 MG SL tablet Place 1 tablet (0.4 mg total) under the tongue every 5 (five) minutes as needed for chest pain. For up to 3 doses per episode. 50 tablet 0   tiZANidine (ZANAFLEX) 4 MG tablet Take 1 tablet (4 mg total) by mouth every 6 (six) hours as needed for muscle spasms. 30 tablet 0   traMADol (ULTRAM) 50 MG tablet Take by mouth.     traZODone (DESYREL) 150 MG tablet TAKE ONE TABLET AT BEDTIME 90 tablet 1   triamcinolone ointment (KENALOG) 0.1 % Apply to neck twice a day for 2 weeks 60 g 0   vitamin C (ASCORBIC ACID) 250 MG tablet Take 250 mg by mouth daily.      Zinc 100 MG TABS Take 100 mg by mouth daily.      Current Facility-Administered Medications on File Prior to Visit  Medication Dose Route Frequency Provider Last Rate Last Admin   oxyCODONE (Oxy IR/ROXICODONE) immediate release tablet 5 mg  5 mg Oral Q3H PRN Hessie Knows, MD         ROS see history of present illness  Objective  Physical Exam Vitals:   12/17/20 1204  BP: 130/80  Pulse: 64  Temp: (!) 97.4 F (36.3 C)  SpO2: 96%    BP Readings from Last 3 Encounters:  12/17/20 130/80  06/14/20 140/70  10/13/19 128/70   Wt Readings from Last 3 Encounters:  12/17/20 166 lb (75.3 kg)  06/14/20 169 lb 9.6 oz (76.9 kg)  03/06/20 167 lb (75.8 kg)    Physical Exam Constitutional:      General: He is not in acute distress.    Appearance: He is not diaphoretic.  Cardiovascular:     Rate and Rhythm: Normal rate and regular rhythm.     Heart sounds: Normal heart sounds.  Pulmonary:     Effort: Pulmonary effort is normal.     Breath sounds: Normal breath sounds.  Skin:    General: Skin is warm and dry.  Neurological:     Mental Status: He is alert.     Assessment/Plan: Please see individual problem  list.  Problem List Items Addressed This Visit     Atrial fibrillation (HCC)    Sinus rhythm today.  He is asymptomatic.  He will continue on Eliquis 5 mg twice daily and atenolol 25 mg daily.       History of melanoma    He will continue to follow with dermatology.       Hypertension - Primary    Adequate control for age.  He will continue lisinopril 10 mg once daily and atenolol 25 mg daily.  Check BMP.       Relevant Orders   Basic Metabolic Panel (BMET)   Sleeping difficulty    Chronic issue.  Stable.  He can continue alprazolam as needed for sleep as prescribed and trazodone 150 mg nightly.        Return in about 6 months (around 06/19/2021).  This visit occurred during the SARS-CoV-2 public health emergency.  Safety protocols were in place, including screening questions prior to the visit, additional usage of staff PPE, and extensive cleaning of exam room while observing appropriate contact time as indicated for disinfecting solutions.    Tommi Rumps, MD [place]

## 2020-12-17 NOTE — Telephone Encounter (Signed)
RX Refill:xanax Last Seen:12-17-20 Last ordered:08-02-20

## 2020-12-17 NOTE — Assessment & Plan Note (Signed)
Sinus rhythm today.  He is asymptomatic.  He will continue on Eliquis 5 mg twice daily and atenolol 25 mg daily.

## 2020-12-17 NOTE — Assessment & Plan Note (Signed)
Chronic issue.  Stable.  He can continue alprazolam as needed for sleep as prescribed and trazodone 150 mg nightly.

## 2020-12-17 NOTE — Assessment & Plan Note (Signed)
He will continue to follow with dermatology. 

## 2020-12-17 NOTE — Patient Instructions (Signed)
Nice to see you. We will call you with your lab results. 

## 2020-12-30 ENCOUNTER — Ambulatory Visit: Payer: PPO | Admitting: Dermatology

## 2020-12-30 ENCOUNTER — Other Ambulatory Visit: Payer: Self-pay

## 2020-12-30 ENCOUNTER — Encounter: Payer: Self-pay | Admitting: Dermatology

## 2020-12-30 DIAGNOSIS — D229 Melanocytic nevi, unspecified: Secondary | ICD-10-CM | POA: Diagnosis not present

## 2020-12-30 DIAGNOSIS — L570 Actinic keratosis: Secondary | ICD-10-CM | POA: Diagnosis not present

## 2020-12-30 DIAGNOSIS — L820 Inflamed seborrheic keratosis: Secondary | ICD-10-CM

## 2020-12-30 DIAGNOSIS — Z1283 Encounter for screening for malignant neoplasm of skin: Secondary | ICD-10-CM

## 2020-12-30 DIAGNOSIS — D1800 Hemangioma unspecified site: Secondary | ICD-10-CM

## 2020-12-30 DIAGNOSIS — L814 Other melanin hyperpigmentation: Secondary | ICD-10-CM

## 2020-12-30 DIAGNOSIS — L578 Other skin changes due to chronic exposure to nonionizing radiation: Secondary | ICD-10-CM | POA: Diagnosis not present

## 2020-12-30 DIAGNOSIS — Z85820 Personal history of malignant melanoma of skin: Secondary | ICD-10-CM | POA: Diagnosis not present

## 2020-12-30 DIAGNOSIS — L821 Other seborrheic keratosis: Secondary | ICD-10-CM | POA: Diagnosis not present

## 2020-12-30 NOTE — Progress Notes (Signed)
Follow-Up Visit   Subjective  Karl Myers is a 75 y.o. 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 The patient presents for Total-Body Skin Exam (TBSE) for skin cancer screening and mole check.  The following portions of the chart were reviewed this encounter and updated as appropriate:   Tobacco  Allergies  Meds  Problems  Med Hx  Surg Hx  Fam Hx     Review of Systems:  No other skin or systemic complaints except as noted in HPI or Assessment and Plan.  Objective  Well appearing patient in no apparent distress; mood and affect are within normal limits.  A full examination was performed including scalp, head, eyes, ears, nose, lips, neck, chest, axillae, abdomen, back, buttocks, bilateral upper extremities, bilateral lower extremities, hands, feet, fingers, toes, fingernails, and toenails. All findings within normal limits unless otherwise noted below.  Left Anterior Neck Erythematous keratotic or waxy stuck-on papule or plaque.   Scalp, forehead (2) Erythematous thin papules/macules with gritty scale.    Assessment & Plan   Lentigines - Scattered tan macules - Due to sun exposure - Benign-appering, observe - Recommend daily broad spectrum sunscreen SPF 30+ to sun-exposed areas, reapply every 2 hours as needed. - Call for any changes  Seborrheic Keratoses - Stuck-on, waxy, tan-brown papules and/or plaques  - Benign-appearing - Discussed benign etiology and prognosis. - Observe - Call for any changes  Melanocytic Nevi - Tan-brown and/or pink-flesh-colored symmetric macules and papules - Benign appearing on exam today - Observation - Call clinic for new or changing moles - Recommend daily use of broad spectrum spf 30+ sunscreen to sun-exposed areas.   Hemangiomas - Red papules - Discussed benign nature - Observe - Call for  any changes  Actinic Damage - Chronic condition, secondary to cumulative UV/sun exposure - diffuse scaly erythematous macules with underlying dyspigmentation - Recommend daily broad spectrum sunscreen SPF 30+ to sun-exposed areas, reapply every 2 hours as needed.  - Staying in the shade or wearing long sleeves, sun glasses (UVA+UVB protection) and wide brim hats (4-inch brim around the entire circumference of the hat) are also recommended for sun protection.  - Call for new or changing lesions.  Skin cancer screening performed today.  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  Inflamed seborrheic keratosis Left Anterior Neck  Destruction of lesion - Left Anterior Neck Complexity: simple   Destruction method: cryotherapy   Informed consent: discussed and consent obtained   Timeout:  patient name, date of birth, surgical site, and procedure verified Lesion destroyed using liquid nitrogen: Yes   Region frozen until ice ball extended beyond lesion: Yes   Outcome: patient tolerated procedure well with no complications   Post-procedure details: wound care instructions given    AK (actinic keratosis) (2) Scalp, forehead  Destruction of lesion - Scalp, forehead Complexity: simple   Destruction method: cryotherapy   Informed consent: discussed and consent obtained   Timeout:  patient name, date of birth, surgical  site, and procedure verified Lesion destroyed using liquid nitrogen: Yes   Region frozen until ice ball extended beyond lesion: Yes   Outcome: patient tolerated procedure well with no complications    Post-procedure details: wound care instructions given    Skin cancer screening   Return in about 4 months (around 05/02/2021) for TBSE.  I, Ashok Cordia, CMA, am acting as scribe for [name], MD . Documentation: I have reviewed the above documentation for accuracy and completeness, and I agree with the above.  [name], MD

## 2020-12-30 NOTE — Patient Instructions (Signed)

## 2021-02-04 ENCOUNTER — Telehealth: Payer: Self-pay | Admitting: Family Medicine

## 2021-02-04 DIAGNOSIS — R7309 Other abnormal glucose: Secondary | ICD-10-CM

## 2021-02-04 NOTE — Telephone Encounter (Signed)
Is there a particular reason he wants his blood sugar tested?  We tested it in July on his lab work and it was in the normal range.

## 2021-02-04 NOTE — Telephone Encounter (Signed)
Patient called and would like to have his blood sugar tested.  Alixandrea Milleson,cma

## 2021-02-04 NOTE — Telephone Encounter (Signed)
Patient called and would like to have his blood sugar tested.

## 2021-02-05 NOTE — Addendum Note (Signed)
Addended by: Leone Haven on: 02/05/2021 03:28 PM   Modules accepted: Orders

## 2021-02-05 NOTE — Telephone Encounter (Signed)
He had his blood glucose checked in July as I outlined in my prior message.  He did not have his A1c checked.  It does look like he is due for his A1c which is a different test than his blood sugar.  I placed an order for the A1c.

## 2021-02-05 NOTE — Telephone Encounter (Signed)
I called and spoke with the patients wife and she stated someone called from [place] and stated he needed his A1c checked I informed her that his A1C was checked in July and he is ok it was normal and he does not need it.  She understood and would relay the message.  Zaryia Markel,cma

## 2021-02-06 NOTE — Telephone Encounter (Signed)
I called and explained to the patient's wife that the patient was due a A1C and she scheduled a lab for tomorrow, I also scheduled the patient for a 6 mo f/up visit with the provider.  Liesel Peckenpaugh,cma

## 2021-02-07 ENCOUNTER — Other Ambulatory Visit (INDEPENDENT_AMBULATORY_CARE_PROVIDER_SITE_OTHER): Payer: PPO

## 2021-02-07 ENCOUNTER — Other Ambulatory Visit: Payer: Self-pay

## 2021-02-07 DIAGNOSIS — R7309 Other abnormal glucose: Secondary | ICD-10-CM | POA: Diagnosis not present

## 2021-02-07 LAB — HEMOGLOBIN A1C: Hgb A1c MFr Bld: 6.1 % (ref 4.6–6.5)

## 2021-02-28 ENCOUNTER — Other Ambulatory Visit: Payer: Self-pay | Admitting: Cardiovascular Disease

## 2021-02-28 DIAGNOSIS — I480 Paroxysmal atrial fibrillation: Secondary | ICD-10-CM

## 2021-02-28 NOTE — Telephone Encounter (Signed)
Eliquis 5 mg refill request received. Patient is 75 years old, weight-75.3 kg, Crea- 1.17on 7/12/22via , Diagnosis-PAF, and last seen by Dr. Rockey Situ on 08/23/19. Dose is appropriate based on dosing criteria. Pt was advised at last refill in April that he would need appt prior to next refill. Will send in 2 week refill to requested pharmacy.  Routing to scheduling, as pt is overdue to see Dr. Rockey Situ.

## 2021-02-28 NOTE — Telephone Encounter (Signed)
Refill request

## 2021-02-28 NOTE — Telephone Encounter (Signed)
Scheduled

## 2021-03-03 ENCOUNTER — Other Ambulatory Visit: Payer: Self-pay

## 2021-03-03 ENCOUNTER — Ambulatory Visit: Payer: PPO | Admitting: Dermatology

## 2021-03-03 DIAGNOSIS — L820 Inflamed seborrheic keratosis: Secondary | ICD-10-CM

## 2021-03-03 DIAGNOSIS — L821 Other seborrheic keratosis: Secondary | ICD-10-CM

## 2021-03-03 DIAGNOSIS — L570 Actinic keratosis: Secondary | ICD-10-CM | POA: Diagnosis not present

## 2021-03-03 DIAGNOSIS — L578 Other skin changes due to chronic exposure to nonionizing radiation: Secondary | ICD-10-CM | POA: Diagnosis not present

## 2021-03-03 NOTE — Patient Instructions (Addendum)
Start triamcinolone ointment apply to right neck twice a day as needed    \Cryotherapy Aftercare  Wash gently with soap and water everyday.   Apply Vaseline and Band-Aid daily until healed.     If you have any questions or concerns for your doctor, please call our main line at [phone] and press option 4 to reach your doctor's medical assistant. If no one answers, please leave a voicemail as directed and we will return your call as soon as possible. Messages left after 4 pm will be answered the following business day.   You may also send [place] a message via [place]. We typically respond to MyChart messages within 1-2 business days.  For prescription refills, please ask your pharmacy to contact our office. Our fax number is [phone].  If you have an urgent issue when the clinic is closed that cannot wait until the next business day, you can page your doctor at the number below.    Please note that while we do our best to be available for urgent issues outside of office hours, we are not available 24/7.   If you have an urgent issue and are unable to reach [place], you may choose to seek medical care at your doctor's office, retail clinic, urgent care center, or emergency room.  If you have a medical emergency, please immediately call 911 or go to the emergency department.  Pager Numbers  - Dr. Nehemiah Massed: [phone]  - Dr. Laurence Ferrari: [phone]  - Dr. Nicole Kindred: [phone]  In the event of inclement weather, please call our main line at [phone] for an update on the status of any delays or closures.  Dermatology Medication Tips: Please keep the boxes that topical medications come in in order to help keep track of the instructions about where and how to use these. Pharmacies typically print the medication instructions only on the boxes and not directly on the medication tubes.   If your medication is too expensive, please contact our office at [phone] option 4 or send [place] a  message through [place].   We are unable to tell what your co-pay for medications will be in advance as this is different depending on your insurance coverage. However, we may be able to find a substitute medication at lower cost or fill out paperwork to get insurance to cover a needed medication.   If a prior authorization is required to get your medication covered by your insurance company, please allow [place] 1-2 business days to complete this process.  Drug prices often vary depending on where the prescription is filled and some pharmacies may offer cheaper prices.  The website www.goodrx.com contains coupons for medications through different pharmacies. The prices here do not account for what the cost may be with help from insurance (it may be cheaper with your insurance), but the website can give you the price if you did not use any insurance.  - You can print the associated coupon and take it with your prescription to the pharmacy.  - You may also stop by our office during regular business hours and pick up a GoodRx coupon card.  - If you need your prescription sent electronically to a different pharmacy, notify our office through [facility] or by phone at [phone] option 4.

## 2021-03-03 NOTE — Progress Notes (Signed)
   Follow-Up Visit   Subjective  Karl Myers is a 75 y.o. male who presents for the following: Skin Problem (Bumps on the right side of neck ).  He has other areas to be evaluated today.  The following portions of the chart were reviewed this encounter and updated as appropriate:   Tobacco  Allergies  Meds  Problems  Med Hx  Surg Hx  Fam Hx     Review of Systems:  No other skin or systemic complaints except as noted in HPI or Assessment and Plan.  Objective  Well appearing patient in no apparent distress; mood and affect are within normal limits.  A focused examination was performed including face,scalp. Relevant physical exam findings are noted in the Assessment and Plan.  right neck x 4 (4) Erythematous keratotic or waxy stuck-on papule or plaque.   face, scalp, ears (8) Erythematous thin papules/macules with gritty scale.    Assessment & Plan  Inflamed seborrheic keratosis right neck x 4  ISK with LSC  Apply triamcinolone ointment apply to affected irritated skin qd-bid prn   Destruction of lesion - right neck x 4 Complexity: simple   Destruction method: cryotherapy   Informed consent: discussed and consent obtained   Timeout:  patient name, date of birth, surgical site, and procedure verified Lesion destroyed using liquid nitrogen: Yes   Region frozen until ice ball extended beyond lesion: Yes   Outcome: patient tolerated procedure well with no complications   Post-procedure details: wound care instructions given    AK (actinic keratosis) (8) face, scalp, ears  Destruction of lesion - face, scalp, ears Complexity: simple   Destruction method: cryotherapy   Informed consent: discussed and consent obtained   Timeout:  patient name, date of birth, surgical site, and procedure verified Lesion destroyed using liquid nitrogen: Yes   Region frozen until ice ball extended beyond lesion: Yes   Outcome: patient tolerated procedure well with no complications    Post-procedure details: wound care instructions given    Actinic Damage - chronic, secondary to cumulative UV radiation exposure/sun exposure over time - diffuse scaly erythematous macules with underlying dyspigmentation - Recommend daily broad spectrum sunscreen SPF 30+ to sun-exposed areas, reapply every 2 hours as needed.  - Recommend staying in the shade or wearing long sleeves, sun glasses (UVA+UVB protection) and wide brim hats (4-inch brim around the entire circumference of the hat). - Call for new or changing lesions.   Seborrheic Keratoses - Stuck-on, waxy, tan-brown papules and/or plaques  - Benign-appearing - Discussed benign etiology and prognosis. - Observe - Call for any changes  Return if symptoms worsen or fail to improve, for as scheduled.  IMarye Round, CMA, am acting as scribe for [name], MD .  Documentation: I have reviewed the above documentation for accuracy and completeness, and I agree with the above.  [name], MD

## 2021-03-06 ENCOUNTER — Encounter: Payer: Self-pay | Admitting: Dermatology

## 2021-03-07 ENCOUNTER — Ambulatory Visit (INDEPENDENT_AMBULATORY_CARE_PROVIDER_SITE_OTHER): Payer: PPO

## 2021-03-07 VITALS — Ht 68.0 in | Wt 166.0 lb

## 2021-03-07 DIAGNOSIS — Z0000 Encounter for general adult medical examination without abnormal findings: Secondary | ICD-10-CM

## 2021-03-07 NOTE — Patient Instructions (Addendum)
[name] , Thank you for taking time to come for your Medicare Wellness Visit. I appreciate your ongoing commitment to your health goals. Please review the following plan we discussed and let me know if I can assist you in the future.   These are the goals we discussed:  Goals       Patient Stated     DIET - INCREASE WATER INTAKE (pt-stated)      Maintain Healthy Lifestyle (pt-stated)      Stay active Healthy diet        This is a list of the screening recommended for you and due dates:  Health Maintenance  Topic Date Due   COVID-19 Vaccine (3 - Pfizer risk series) 03/23/2021*   Tetanus Vaccine  06/06/2021*   Cologuard (Stool DNA test)  06/06/2021*   Zoster (Shingles) Vaccine (1 of 2) 06/06/2021*   Flu Shot  09/05/2021*   Hepatitis C Screening: USPSTF Recommendation to screen - Ages 18-79 yo.  Completed   HPV Vaccine  Aged Out  *Topic was postponed. The date shown is not the original due date.    Advanced directives: on file  Conditions/risks identified: none new  Next appointment: Follow up in one year for your annual wellness visit.   Preventive Care 21 Years and Older, Male Preventive care refers to lifestyle choices and visits with your health care provider that can promote health and wellness. What does preventive care include? A yearly physical exam. This is also called an annual well check. Dental exams once or twice a year. Routine eye exams. Ask your health care provider how often you should have your eyes checked. Personal lifestyle choices, including: Daily care of your teeth and gums. Regular physical activity. Eating a healthy diet. Avoiding tobacco and drug use. Limiting alcohol use. Practicing safe sex. Taking low doses of aspirin every day. Taking vitamin and mineral supplements as recommended by your health care provider. What happens during an annual well check? The services and screenings done by your health care provider during your annual  well check will depend on your age, overall health, lifestyle risk factors, and family history of disease. Counseling  Your health care provider may ask you questions about your: Alcohol use. Tobacco use. Drug use. Emotional well-being. Home and relationship well-being. Sexual activity. Eating habits. History of falls. Memory and ability to understand (cognition). Work and work Statistician. Screening  You may have the following tests or measurements: Height, weight, and BMI. Blood pressure. Lipid and cholesterol levels. These may be checked every 5 years, or more frequently if you are over 59 years old. Skin check. Lung cancer screening. You may have this screening every year starting at age 55 if you have a 30-pack-year history of smoking and currently smoke or have quit within the past 15 years. Fecal occult blood test (FOBT) of the stool. You may have this test every year starting at age 64. Flexible sigmoidoscopy or colonoscopy. You may have a sigmoidoscopy every 5 years or a colonoscopy every 10 years starting at age 39. Prostate cancer screening. Recommendations will vary depending on your family history and other risks. Hepatitis C blood test. Hepatitis B blood test. Sexually transmitted disease (STD) testing. Diabetes screening. This is done by checking your blood sugar (glucose) after you have not eaten for a while (fasting). You may have this done every 1-3 years. Abdominal aortic aneurysm (AAA) screening. You may need this if you are a current or former smoker. Osteoporosis. You may be screened  starting at age 78 if you are at high risk. Talk with your health care provider about your test results, treatment options, and if necessary, the need for more tests. Vaccines  Your health care provider may recommend certain vaccines, such as: Influenza vaccine. This is recommended every year. Tetanus, diphtheria, and acellular pertussis (Tdap, Td) vaccine. You may need a Td booster  every 10 years. Zoster vaccine. You may need this after age 69. Pneumococcal 13-valent conjugate (PCV13) vaccine. One dose is recommended after age 59. Pneumococcal polysaccharide (PPSV23) vaccine. One dose is recommended after age 56. Talk to your health care provider about which screenings and vaccines you need and how often you need them. This information is not intended to replace advice given to you by your health care provider. Make sure you discuss any questions you have with your health care provider. Document Released: 06/21/2015 Document Revised: 02/12/2016 Document Reviewed: 03/26/2015 Elsevier Interactive Patient Education  2017 [place] Prevention in the Home Falls can cause injuries. They can happen to people of all ages. There are many things you can do to make your home safe and to help prevent falls. What can I do on the outside of my home? Regularly fix the edges of walkways and driveways and fix any cracks. Remove anything that might make you trip as you walk through a door, such as a raised step or threshold. Trim any bushes or trees on the path to your home. Use bright outdoor lighting. Clear any walking paths of anything that might make someone trip, such as rocks or tools. Regularly check to see if handrails are loose or broken. Make sure that both sides of any steps have handrails. Any raised decks and porches should have guardrails on the edges. Have any leaves, snow, or ice cleared regularly. Use sand or salt on walking paths during winter. Clean up any spills in your garage right away. This includes oil or grease spills. What can I do in the bathroom? Use night lights. Install grab bars by the toilet and in the tub and shower. Do not use towel bars as grab bars. Use non-skid mats or decals in the tub or shower. If you need to sit down in the shower, use a plastic, non-slip stool. Keep the floor dry. Clean up any water that spills on the floor as soon  as it happens. Remove soap buildup in the tub or shower regularly. Attach bath mats securely with double-sided non-slip rug tape. Do not have throw rugs and other things on the floor that can make you trip. What can I do in the bedroom? Use night lights. Make sure that you have a light by your bed that is easy to reach. Do not use any sheets or blankets that are too big for your bed. They should not hang down onto the floor. Have a firm chair that has side arms. You can use this for support while you get dressed. Do not have throw rugs and other things on the floor that can make you trip. What can I do in the kitchen? Clean up any spills right away. Avoid walking on wet floors. Keep items that you use a lot in easy-to-reach places. If you need to reach something above you, use a strong step stool that has a grab bar. Keep electrical cords out of the way. Do not use floor polish or wax that makes floors slippery. If you must use wax, use non-skid floor wax. Do not have throw  rugs and other things on the floor that can make you trip. What can I do with my stairs? Do not leave any items on the stairs. Make sure that there are handrails on both sides of the stairs and use them. Fix handrails that are broken or loose. Make sure that handrails are as long as the stairways. Check any carpeting to make sure that it is firmly attached to the stairs. Fix any carpet that is loose or worn. Avoid having throw rugs at the top or bottom of the stairs. If you do have throw rugs, attach them to the floor with carpet tape. Make sure that you have a light switch at the top of the stairs and the bottom of the stairs. If you do not have them, ask someone to add them for you. What else can I do to help prevent falls? Wear shoes that: Do not have high heels. Have rubber bottoms. Are comfortable and fit you well. Are closed at the toe. Do not wear sandals. If you use a stepladder: Make sure that it is fully  opened. Do not climb a closed stepladder. Make sure that both sides of the stepladder are locked into place. Ask someone to hold it for you, if possible. Clearly mark and make sure that you can see: Any grab bars or handrails. First and last steps. Where the edge of each step is. Use tools that help you move around (mobility aids) if they are needed. These include: Canes. Walkers. Scooters. Crutches. Turn on the lights when you go into a dark area. Replace any light bulbs as soon as they burn out. Set up your furniture so you have a clear path. Avoid moving your furniture around. If any of your floors are uneven, fix them. If there are any pets around you, be aware of where they are. Review your medicines with your doctor. Some medicines can make you feel dizzy. This can increase your chance of falling. Ask your doctor what other things that you can do to help prevent falls. This information is not intended to replace advice given to you by your health care provider. Make sure you discuss any questions you have with your health care provider. Document Released: 03/21/2009 Document Revised: 10/31/2015 Document Reviewed: 06/29/2014 Elsevier Interactive Patient Education  2017 [place].  Opioid Pain Medicine Management Opioid pain medicines are strong medicines that are used to treat bad or very bad pain. When you take them for a short time, they can help you: Sleep better. Do better in physical therapy. Feel better during the first few days after you get hurt. Recover from surgery. Only take these medicines if a doctor says that you can. You should only take them for a short time. This is because opioids can be very addictive. This means that they are hard to stop taking. The longer you take opioids, the harder it may be to stop taking them. What are the risks? Opioids can cause problems (side effects). Taking them for more than 3 days raises your chance of problems, such as: Trouble  pooping (constipation). Feeling sick to your stomach (nausea). Vomiting. Feeling very sleepy. Confusion. Not being able to stop taking the medicine. Breathing problems. Taking opioids for a long time can make it hard for you to do daily tasks. It can also put you at risk for: Car accidents. Depression. Suicide. Heart attack. Taking too much of the medicine (overdose). This can lead to death. What is a pain treatment plan? A  pain treatment plan is a plan made by you and your doctor. Work with your doctor to make a plan for treating your pain. To help you do this: Talk about the goals of your treatment, including: How much pain you might expect to have. How you will manage the pain. Talk about the risks and benefits of taking these medicines for your condition. Remember that a good treatment plan uses more than one approach and lowers the risks of side effects. Tell your doctor about the amount of medicines you take and about any drug or alcohol use. Get your pain medicine prescriptions from only one doctor. Pain can be managed with other treatments. Work with your doctor to find other ways to help your pain, such as: Physical therapy or doing gentle exercises. Counseling. Eating healthy foods. Massage. Meditation. Other pain medicines. How to use opioid pain medicine safely Taking medicine Take your pain medicine exactly as told by your doctor. Take it only when you need it. If your pain is not too bad, you may take less medicine if your doctor allows. If you have no pain, do not take the medicine unless your doctor tells you to take it. If your pain is very bad, do not take more medicine than your doctor told you to take. Call your doctor to know what to do. Write down the times when you take your pain medicine. Look at the times before you take your next dose. Take other over-the-counter or prescription medicines only as told by your doctor. Keeping yourself and others  safe  While you are taking opioids: Do not drive, use machines, or power tools. Do not sign important papers (legal documents). Do not drink alcohol. Do not take sleeping pills. Do not take care of children by yourself. Do not do activities where you need to climb or be in high places, like working on a ladder. Do not go to a lake, river, ocean, swimming pool, or hot tub. Keep your opioids locked up or in a place where children cannot reach them. Do not share your pain medicine with anyone. Stopping your use of opioids If you have been taking opioids for more than a few weeks, you may need to slowly decrease (taper) how much you take until you stop taking them. Doing this can lower your chance of having symptoms.  Symptoms that come from suddenly stopping the use of opioids include: Pain and cramping in your belly (abdomen). Feeling sick to your stomach (nausea).z Sweating. Feeling very sleepy. Feeling restless. Shaking you cannot control (tremors). Cravings for the medicine. Do not try to stop taking them by yourself. Work with your doctor to stop. Your doctor will help you take less until you are not taking the medicine at all. Getting rid of unused pills Do not save any pills that you did not use. Get rid of the pills by: Taking them to a take-back program in your area. Bringing them to a pharmacy that receives unused pills. Flushing them down the toilet. Check the label or package insert of your medicine to see whether this is safe to do. Throwing them in the trash. Check the label or package insert of your medicine to see whether this is safe to do. If it is safe to throw them out: Take the pills out of their container. Put the pills into a container you can seal. Mix the pills with used coffee grounds, food scraps, dirt, or cat litter. Put this in the trash. Follow these  instructions at home: Activity Do exercises as told by your doctor. Avoid doing things that make your  pain worse. Return to your normal activities as told by your doctor. Ask your doctor what activities are safe for you. General instructions You may need to take these actions to prevent or treat constipation: Drink enough fluid to keep your pee (urine) pale yellow. Take over-the-counter or prescription medicines. Eat foods that are high in fiber. These include beans, whole grains, and fresh fruits and vegetables. Limit foods that are high in fat and sugar. These include fried or sweet foods. Keep all follow-up visits. Where to find support If you have been taking opioids for a long time, get help from a local support group or counselor. Ask your doctor about this. Where to find more information Centers for Disease Control and Prevention (CDC): http://www.wolf.info/ [place]. Food and Drug Administration (FDA): [place] Get help right away if: You may have taken too much of an opioid (overdosed). Common symptoms of an overdose: Your breathing is slower or more shallow than normal. You have a very slow heartbeat. Your speech is not normal. You vomit or you feel as if you may vomit. The black centers of your eyes (pupils) are smaller than normal. You have other potential symptoms: You feel very confused. You faint. You are very sleepy. You have cold skin. You have blue lips or fingernails. You have thoughts of harming yourself or harming others. These symptoms may be an emergency. Get help right away. Call your local emergency services (911 in the [place] Do not wait to see if the symptoms will go away. Do not drive yourself to the hospital. Get help right away if you feel like you may hurt yourself or others, or have thoughts about taking your own life. Go to your nearest emergency room or: Call your local emergency services (911 in the [place] Call the [facility] at 9051627043. Call a suicide crisis helpline, such as the [place] at  8043781042. This is open 24 hours a day. Text the Crisis Text Line at (478)880-8090. Summary Opioid are strong medicines that are used to treat bad or very bad pain. A pain treatment plan is a plan made by you and your doctor. Work with your doctor to make a plan for treating your pain. If you think that you or someone else may have taken too much of an opioid, get help right away. This information is not intended to replace advice given to you by your health care provider. Make sure you discuss any questions you have with your health care provider. Document Revised: 09/04/2020 Document Reviewed: 09/04/2020 Elsevier Patient Education  [place].

## 2021-03-07 NOTE — Progress Notes (Signed)
Subjective:   Karl Myers is a 75 y.o. male who presents for Medicare Annual/Subsequent preventive examination.  Review of Systems    No ROS.  Medicare Wellness Virtual Visit.  Visual/audio telehealth visit, UTA vital signs.   See social history for additional risk factors.   Cardiac Risk Factors include: advanced age (>65men, >50 women);male gender;hypertension     Objective:    Today's Vitals   03/07/21 1018  Weight: 166 lb (75.3 kg)  Height: 5\' 8"  (1.727 m)   Body mass index is 25.24 kg/m.  Advanced Directives 03/07/2021 07/24/2019 07/24/2019 02/17/2019 08/25/2018 02/02/2018 08/19/2017  Does Patient Have a Medical Advance Directive? Yes Yes No Yes No Yes No  Type of Paramedic of [place];Living will Living will - [place];Living will - [place];Living will -  Does patient want to make changes to medical advance directive? No - Patient declined No - Patient declined - No - Patient declined - No - Patient declined -  Copy of [place] in Chart? Yes - validated most recent copy scanned in chart (See row information) - - Yes - validated most recent copy scanned in chart (See row information) - Yes -  Would patient like information on creating a medical advance directive? - - No - Patient declined - No - Patient declined - No - Patient declined    Current Medications (verified) Outpatient Encounter Medications as of 03/07/2021  Medication Sig   ALPRAZolam (XANAX) 0.5 MG tablet TAKE ONE-HALF TABLET BY MOUTH EVERY MORNING AND TAKE ONE TABLET BY MOUTH AT BEDTIME AS DIRECTED   apixaban (ELIQUIS) 5 MG TABS tablet Take 1 tablet (5 mg total) by mouth 2 (two) times daily. NO FURTHER REFILLS UNTIL YOU SEE DR. GOLLAN   atenolol (TENORMIN) 25 MG tablet TAKE ONE TABLET AT BEDTIME   atorvastatin (LIPITOR) 80 MG tablet TAKE 1 TABLET BY MOUTH DAILY.   benzonatate (TESSALON) 200 MG capsule Take 1 capsule (200 mg  total) by mouth 2 (two) times daily as needed for cough.   cholecalciferol (VITAMIN D3) 25 MCG (1000 UNIT) tablet Take 1,000 Units by mouth daily.    escitalopram (LEXAPRO) 5 MG tablet TAKE ONE TABLET BY MOUTH EVERY DAY   gabapentin (NEURONTIN) 100 MG capsule Take by mouth.   ketoconazole (NIZORAL) 2 % cream Apply to the feet and between the toes QHS.   lisinopril (ZESTRIL) 10 MG tablet TAKE ONE TABLET AT BEDTIME   mupirocin ointment (BACTROBAN) 2 % Apply 1 application topically daily. With dressing changes   nicotine (NICODERM CQ - DOSED IN MG/24 HOURS) 21 mg/24hr patch Place 1 patch (21 mg total) onto the skin daily. For 6 weeks. Call for next step once on the last week.   nitroGLYCERIN (NITROSTAT) 0.4 MG SL tablet Place 1 tablet (0.4 mg total) under the tongue every 5 (five) minutes as needed for chest pain. For up to 3 doses per episode.   tiZANidine (ZANAFLEX) 4 MG tablet Take 1 tablet (4 mg total) by mouth every 6 (six) hours as needed for muscle spasms.   traMADol (ULTRAM) 50 MG tablet Take by mouth.   traZODone (DESYREL) 150 MG tablet TAKE ONE TABLET AT BEDTIME   triamcinolone ointment (KENALOG) 0.1 % Apply to neck twice a day for 2 weeks   vitamin C (ASCORBIC ACID) 250 MG tablet Take 250 mg by mouth daily.    Zinc 100 MG TABS Take 100 mg by mouth daily.  Facility-Administered Encounter Medications as of 03/07/2021  Medication   oxyCODONE (Oxy IR/ROXICODONE) immediate release tablet 5 mg    Allergies (verified) Hydrocodone-acetaminophen and Neurontin [gabapentin]   History: Past Medical History:  Diagnosis Date   Actinic keratosis    Arthritis    COPD (chronic obstructive pulmonary disease) (HCC)    DDD (degenerative disc disease), cervical    DDD (degenerative disc disease), cervical    Depression    History of COVID-19    Hypertension    Melanoma ([place]) 09/04/2019   R 5th toe lateral tip, Stage IV, 0.24mm Breslow's   Pre-diabetes    Past Surgical History:  Procedure  Laterality Date   BACK SURGERY     x 3   CARPAL TUNNEL RELEASE Right 08/19/2017   Procedure: CARPAL TUNNEL RELEASE;  Surgeon: Hessie Knows, MD;  Location: [facility] ORS;  Service: Orthopedics;  Laterality: Right;   CATARACT EXTRACTION W/PHACO Right 11/17/2016   Procedure: CATARACT EXTRACTION PHACO AND INTRAOCULAR LENS PLACEMENT (IOC);  Surgeon: Birder Robson, MD;  Location: [facility] ORS;  Service: Ophthalmology;  Laterality: Right;  [place] 00:37 AP% 12.6 CDE 4.69 Fluid pack lot # [id number]   CATARACT EXTRACTION W/PHACO Left 12/08/2016   Procedure: CATARACT EXTRACTION PHACO AND INTRAOCULAR LENS PLACEMENT (IOC);  Surgeon: Birder Robson, MD;  Location: [facility] ORS;  Service: Ophthalmology;  Laterality: Left;  [place] 00:33 AP% 13.9 CDE 4.63 Fluid pack lot # [id number]   ULNAR TUNNEL RELEASE Right 08/19/2017   Procedure: CUBITAL TUNNEL RELEASE;  Surgeon: Hessie Knows, MD;  Location: [facility] ORS;  Service: Orthopedics;  Laterality: Right;   Family History  Problem Relation Age of Onset   Heart attack Mother    Prostate cancer Father    Breast cancer Sister 19   Dementia Sister    Cancer Brother        unknown what kind   Social History   Socioeconomic History   Marital status: Married    Spouse name: Not on file   Number of children: Not on file   Years of education: Not on file   Highest education level: Not on file  Occupational History   Not on file  Tobacco Use   Smoking status: Every Day    Packs/day: 1.00    Types: Cigarettes   Smokeless tobacco: Never  Vaping Use   Vaping Use: Never used  Substance and Sexual Activity   Alcohol use: No   Drug use: No   Sexual activity: Not on file  Other Topics Concern   Not on file  Social History Narrative   Not on file   Social Determinants of Health   Financial Resource Strain: Low Risk    Difficulty of Paying Living Expenses: Not hard at all  Food Insecurity: No Food Insecurity   Worried About Charity fundraiser in the Last Year: Never true    [place] in the Last Year: Never true  Transportation Needs: No Transportation Needs   Lack of Transportation (Medical): No   Lack of Transportation (Non-Medical): No  Physical Activity: Not on file  Stress: No Stress Concern Present   Feeling of Stress : Not at all  Social Connections: Unknown   Frequency of Communication with Friends and Family: Not on file   Frequency of Social Gatherings with Friends and Family: Not on file   Attends Religious Services: Not on file   Active Member of Clubs or Organizations: Not on file   Attends Archivist Meetings: Not on file  Marital Status: Married    Tobacco Counseling Ready to quit: Not Answered Counseling given: Not Answered   Clinical Intake:  Pre-visit preparation completed: Yes        Diabetes: No  How often do you need to have someone help you when you read instructions, pamphlets, or other written materials from your doctor or pharmacy?: 1 - Never    Interpreter Needed?: No      Activities of Daily Living In your present state of health, do you have any difficulty performing the following activities: 03/07/2021  Hearing? N  Vision? N  Difficulty concentrating or making decisions? N  Walking or climbing stairs? N  Dressing or bathing? N  Doing errands, shopping? N  Preparing Food and eating ? N  Using the Toilet? N  In the past six months, have you accidently leaked urine? N  Do you have problems with loss of bowel control? N  Managing your Medications? N  Managing your Finances? N  Housekeeping or managing your Housekeeping? N  Some recent data might be hidden    Patient Care Team: Leone Haven, MD as PCP - General (Family Medicine) Minna Merritts, MD as PCP - Cardiology (Cardiology)  Indicate any recent Medical Services you may have received from other than [facility] providers in the past year (date may be approximate).     Assessment:   This is a routine wellness examination  for [place].  I connected with [place] today by telephone and verified that I am speaking with the correct person using two identifiers. Location patient: home Location provider: work Persons participating in the virtual visit: patient, Marine scientist.    I discussed the limitations, risks, security and privacy concerns of performing an evaluation and management service by telephone and the availability of in person appointments. The patient expressed understanding and verbally consented to this telephonic visit.    Interactive audio and video telecommunications were attempted between this provider and patient, however failed, due to patient having technical difficulties OR patient did not have access to video capability.  We continued and completed visit with audio only.  Some vital signs may be absent or patient reported.   Hearing/Vision screen Hearing Screening - Comments:: Patient is able to hear conversational tones without difficulty. No issues reported.  Vision Screening - Comments:: Followed by [facility]  Wears reading glasses  Annual exam Cataract extraction, bilateral  They have regular follow up with the ophthalmologist  Dietary issues and exercise activities discussed: Current Exercise Habits: Home exercise routine, Intensity: Mild  Enjoys gardening and wood working.  Regular diet   Goals Addressed               This Visit's Progress     Patient Stated     Maintain Healthy Lifestyle (pt-stated)        Stay active Healthy diet       Depression Screen PHQ 2/9 Scores 03/07/2021 12/17/2020 06/14/2020 03/06/2020 01/23/2020 10/13/2019 06/16/2019  PHQ - 2 Score 0 0 0 0 0 0 0    Fall Risk Fall Risk  03/07/2021 12/17/2020 06/14/2020 01/23/2020 10/13/2019  Falls in the past year? 0 0 0 0 0  Number falls in past yr: - 0 0 0 0  Injury with Fall? - - - 0 -  Follow up Falls evaluation completed Falls evaluation completed Falls evaluation completed Falls evaluation completed Falls  evaluation completed    [place]: Adequate lighting in your home to reduce  risk of falls? Yes   ASSISTIVE DEVICES UTILIZED TO PREVENT FALLS: Use of a cane, walker or w/c? No   TIMED UP AND GO: Was the test performed? No .   Cognitive Function: Patient is alert and oriented x3.  MMSE - Mini Mental State Exam 02/02/2018  Orientation to time 5  Orientation to Place 5  Registration 3  Attention/ Calculation 5  Recall 3  Language- name 2 objects 2  Language- repeat 1  Language- follow 3 step command 3  Language- read & follow direction 1  Write a sentence 1  Copy design 1  Total score 30     6CIT Screen 03/07/2021 02/17/2019  What Year? 0 points 0 points  What month? 0 points 0 points  What time? 0 points 0 points  Count back from 20 0 points 0 points  Months in reverse 0 points 0 points  Repeat phrase - 0 points  Total Score - 0    Immunizations Immunization History  Administered Date(s) Administered   Fluad Quad(high Dose 65+) 02/20/2019, 03/07/2020   Influenza, High Dose Seasonal PF 03/12/2017, 03/31/2018   Influenza-Unspecified 02/20/2019   PFIZER(Purple Top)SARS-COV-2 Vaccination 10/27/2019, 12/01/2019   TDAP status: Due, Education has been provided regarding the importance of this vaccine. Advised may receive this vaccine at local pharmacy or Health Dept. Aware to provide a copy of the vaccination record if obtained from local pharmacy or Health Dept. Verbalized acceptance and understanding. Deferred.   Influenza vaccine- plans to receive later in the season. Deferred.   Shingles vaccine- Due, Education has been provided regarding the importance of this vaccine. Advised may receive this vaccine at local pharmacy or Health Dept. Aware to provide a copy of the vaccination record if obtained from local pharmacy or Health Dept. Verbalized acceptance and understanding. Deferred.   Health Maintenance Health Maintenance  Topic Date Due    COVID-19 Vaccine (3 - Pfizer risk series) 03/23/2021 (Originally 12/29/2019)   TETANUS/TDAP  06/06/2021 (Originally 08/30/1964)   Fecal DNA (Cologuard)  06/06/2021 (Originally 08/31/1995)   Zoster Vaccines- Shingrix (1 of 2) 06/06/2021 (Originally [birth year]30/1964)   INFLUENZA VACCINE  09/05/2021 (Originally 01/06/2021)   Hepatitis C Screening  Completed   HPV VACCINES  Aged Out   Cologuard- deferred per patient.   Vision Screening: Recommended annual ophthalmology exams for early detection of glaucoma and other disorders of the eye.  Dental Screening: Recommended annual dental exams for proper oral hygiene  Community Resource Referral / Chronic Care Management: CRR required this visit?  No   CCM required this visit?  No      Plan:   Keep all routine maintenance appointments.   I have personally reviewed and noted the following in the patient's chart:   Medical and social history Use of alcohol, tobacco or illicit drugs  Current medications and supplements including opioid prescriptions. Patient is currently taking opioid prescriptions. Information provided to patient regarding non-opioid alternatives. Patient advised to discuss non-opioid treatment plan with their provider. Followed by Hessie Knows, MD Functional ability and status Nutritional status Physical activity Advanced directives List of other physicians Hospitalizations, surgeries, and ER visits in previous 12 months Vitals Screenings to include cognitive, depression, and falls Referrals and appointments  In addition, I have reviewed and discussed with patient certain preventive protocols, quality metrics, and best practice recommendations. A written personalized care plan for preventive services as well as general preventive health recommendations were provided to patient.     Varney Biles, LPN   [id number]

## 2021-03-10 ENCOUNTER — Encounter: Payer: Self-pay | Admitting: Emergency Medicine

## 2021-03-10 ENCOUNTER — Other Ambulatory Visit: Payer: Self-pay | Admitting: Family Medicine

## 2021-03-10 ENCOUNTER — Ambulatory Visit
Admission: EM | Admit: 2021-03-10 | Discharge: 2021-03-10 | Disposition: A | Discharge status: Home / Self Care | Payer: PPO | Attending: Emergency Medicine | Admitting: Emergency Medicine

## 2021-03-10 ENCOUNTER — Other Ambulatory Visit: Payer: Self-pay

## 2021-03-10 DIAGNOSIS — E785 Hyperlipidemia, unspecified: Secondary | ICD-10-CM

## 2021-03-10 DIAGNOSIS — J0100 Acute maxillary sinusitis, unspecified: Secondary | ICD-10-CM

## 2021-03-10 LAB — POCT RAPID STREP A (OFFICE): Rapid Strep A Screen: NEGATIVE

## 2021-03-10 MED ORDER — AMOXICILLIN 875 MG PO TABS: 875.0000 mg | ORAL_TABLET | Freq: Two times a day (BID) | ORAL | 0 refills | Status: AC

## 2021-03-10 NOTE — ED Provider Notes (Signed)
Karl Myers    CSN: [id number] Arrival date & time: 03/10/21  2458      History   Chief Complaint Chief Complaint  Patient presents with   Sore Throat    HPI Karl Myers is a 75 y.o. male.  Patient presents with 2-week history of sinus congestion, postnasal drip, sore throat, headache, body aches, cough.  He denies fever, rash, shortness of breath, vomiting, diarrhea, or other symptoms.  OTC treatment attempted at home.  He had a negative COVID test yesterday and declines test today.  His medical history includes hypertension, atrial fibrillation, COPD, prediabetes, arthritis, melanoma.  The history is provided by the patient and medical records.   Past Medical History:  Diagnosis Date   Actinic keratosis    Arthritis    COPD (chronic obstructive pulmonary disease) (HCC)    DDD (degenerative disc disease), cervical    DDD (degenerative disc disease), cervical    Depression    History of COVID-19    Hypertension    Melanoma ([place]) 09/04/2019   R 5th toe lateral tip, Stage IV, 0.70mm Breslow's   Pre-diabetes     Patient Active Problem List   Diagnosis Date Noted   History of melanoma 10/13/2019   Atrial fibrillation ([place]) 08/01/2019   Low TSH level 08/01/2019   COPD (chronic obstructive pulmonary disease) ([place])    Anxiety    Lump of left breast 05/22/2019   Skin lesion 05/22/2019   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   Family history of prostate cancer in father 06/17/2018   CAD (coronary artery disease) 07/27/2017   Tobacco abuse 06/24/2017   Hypertension 03/12/2017   Hyperlipidemia 03/12/2017   Fatigue 03/12/2017   Sleeping difficulty 03/12/2017   Cervical radiculopathy 03/12/2017    Past Surgical History:  Procedure Laterality Date   BACK SURGERY     x 3   CARPAL TUNNEL RELEASE Right 08/19/2017   Procedure: CARPAL TUNNEL RELEASE;  Surgeon: Hessie Knows, MD;  Location: [facility] ORS;  Service: Orthopedics;  Laterality: Right;    CATARACT EXTRACTION W/PHACO Right 11/17/2016   Procedure: CATARACT EXTRACTION PHACO AND INTRAOCULAR LENS PLACEMENT (IOC);  Surgeon: Birder Robson, MD;  Location: [facility] ORS;  Service: Ophthalmology;  Laterality: Right;  [place] 00:37 AP% 12.6 CDE 4.69 Fluid pack lot # [id number]   CATARACT EXTRACTION W/PHACO Left 12/08/2016   Procedure: CATARACT EXTRACTION PHACO AND INTRAOCULAR LENS PLACEMENT (IOC);  Surgeon: Birder Robson, MD;  Location: [facility] ORS;  Service: Ophthalmology;  Laterality: Left;  [place] 00:33 AP% 13.9 CDE 4.63 Fluid pack lot # [id number]   ULNAR TUNNEL RELEASE Right 08/19/2017   Procedure: CUBITAL TUNNEL RELEASE;  Surgeon: Hessie Knows, MD;  Location: [facility] ORS;  Service: Orthopedics;  Laterality: Right;       Home Medications    Prior to Admission medications   Medication Sig Start Date End Date Taking? Authorizing Provider  amoxicillin (AMOXIL) 875 MG tablet Take 1 tablet (875 mg total) by mouth 2 (two) times daily for 7 days. 03/10/21 03/17/21 Yes Sharion Balloon, NP  ALPRAZolam Duanne Moron) 0.5 MG tablet TAKE ONE-HALF TABLET BY MOUTH EVERY MORNING AND TAKE ONE TABLET BY MOUTH AT BEDTIME AS DIRECTED 12/18/20   Leone Haven, MD  apixaban (ELIQUIS) 5 MG TABS tablet Take 1 tablet (5 mg total) by mouth 2 (two) times daily. NO FURTHER REFILLS UNTIL YOU SEE DR. Rockey Situ 02/28/21   Minna Merritts, MD  atenolol (TENORMIN) 25 MG tablet TAKE ONE TABLET AT BEDTIME 12/06/20  Leone Haven, MD  atorvastatin (LIPITOR) 80 MG tablet TAKE 1 TABLET BY MOUTH DAILY. 10/15/20   Leone Haven, MD  benzonatate (TESSALON) 200 MG capsule Take 1 capsule (200 mg total) by mouth 2 (two) times daily as needed for cough. 08/01/19   Leone Haven, MD  cholecalciferol (VITAMIN D3) 25 MCG (1000 UNIT) tablet Take 1,000 Units by mouth daily.     [provider]  escitalopram (LEXAPRO) 5 MG tablet TAKE ONE TABLET BY MOUTH EVERY DAY 02/19/20   Leone Haven, MD  gabapentin (NEURONTIN) 100 MG  capsule Take by mouth. 02/05/20 02/04/21  [provider]  ketoconazole (NIZORAL) 2 % cream Apply to the feet and between the toes QHS. 12/18/19   Ralene Bathe, MD  lisinopril (ZESTRIL) 10 MG tablet TAKE ONE TABLET AT BEDTIME 12/06/20   Leone Haven, MD  mupirocin ointment (BACTROBAN) 2 % Apply 1 application topically daily. With dressing changes 10/16/19   Ralene Bathe, MD  nicotine (NICODERM CQ - DOSED IN MG/24 HOURS) 21 mg/24hr patch Place 1 patch (21 mg total) onto the skin daily. For 6 weeks. Call for next step once on the last week. 08/04/19   Leone Haven, MD  nitroGLYCERIN (NITROSTAT) 0.4 MG SL tablet Place 1 tablet (0.4 mg total) under the tongue every 5 (five) minutes as needed for chest pain. For up to 3 doses per episode. 02/21/19   Leone Haven, MD  tiZANidine (ZANAFLEX) 4 MG tablet Take 1 tablet (4 mg total) by mouth every 6 (six) hours as needed for muscle spasms. 07/17/19   Crecencio Mc, MD  traMADol (ULTRAM) 50 MG tablet Take by mouth. 09/05/19   [provider]  traZODone (DESYREL) 150 MG tablet TAKE ONE TABLET AT BEDTIME 12/06/20   Leone Haven, MD  triamcinolone ointment (KENALOG) 0.1 % Apply to neck twice a day for 2 weeks 07/03/20   Ralene Bathe, MD  vitamin C (ASCORBIC ACID) 250 MG tablet Take 250 mg by mouth daily.     [provider]  Zinc 100 MG TABS Take 100 mg by mouth daily.     [provider]    Family History Family History  Problem Relation Age of Onset   Heart attack Mother    Prostate cancer Father    Breast cancer Sister 36   Dementia Sister    Cancer Brother        unknown what kind    Social History Social History   Tobacco Use   Smoking status: Every Day    Packs/day: 1.00    Types: Cigarettes   Smokeless tobacco: Never  Vaping Use   Vaping Use: Never used  Substance Use Topics   Alcohol use: No   Drug use: No     Allergies   Hydrocodone-acetaminophen and Neurontin  [gabapentin]   Review of Systems Review of Systems  Constitutional:  Negative for chills and fever.  HENT:  Positive for congestion, postnasal drip and sore throat. Negative for ear pain.   Respiratory:  Positive for cough. Negative for shortness of breath.   Cardiovascular:  Negative for chest pain and palpitations.  Gastrointestinal:  Negative for abdominal pain and vomiting.  Skin:  Negative for color change and rash.  All other systems reviewed and are negative.   Physical Exam Triage Vital Signs ED Triage Vitals  Enc Vitals Group     BP 03/10/21 1008 (!) 158/84     Pulse Rate  03/10/21 1008 62     Resp 03/10/21 1008 18     Temp 03/10/21 1008 97.9 F (36.6 C)     Temp Source 03/10/21 1008 Oral     SpO2 03/10/21 1008 96 %     Weight --      Height --      Head Circumference --      Peak Flow --      Pain Score 03/10/21 1011 5     Pain Loc --      Pain Edu? --      Excl. in [place]? --    No data found.  Updated Vital Signs BP (!) 158/84 (BP Location: Left Arm)   Pulse 62   Temp 97.9 F (36.6 C) (Oral)   Resp 18   SpO2 96%   Visual Acuity Right Eye Distance:   Left Eye Distance:   Bilateral Distance:    Right Eye Near:   Left Eye Near:    Bilateral Near:     Physical Exam Vitals and nursing note reviewed.  Constitutional:      General: He is not in acute distress.    Appearance: He is well-developed.  HENT:     Head: Normocephalic and atraumatic.     Right Ear: Tympanic membrane normal.     Left Ear: Tympanic membrane normal.     Nose: Congestion and rhinorrhea present.     Mouth/Throat:     Mouth: Mucous membranes are moist.     Pharynx: Oropharynx is clear.  Eyes:     Conjunctiva/sclera: Conjunctivae normal.  Cardiovascular:     Rate and Rhythm: Normal rate and regular rhythm.     Heart sounds: Normal heart sounds.  Pulmonary:     Effort: Pulmonary effort is normal. No respiratory distress.     Breath sounds: Normal breath sounds.  Abdominal:      Palpations: Abdomen is soft.     Tenderness: There is no abdominal tenderness.  Musculoskeletal:     Cervical back: Neck supple.  Skin:    General: Skin is warm and dry.  Neurological:     Mental Status: He is alert and oriented to person, place, and time.     Gait: Gait normal.  Psychiatric:        Mood and Affect: Mood normal.        Behavior: Behavior normal.     UC Treatments / Results  Labs (all labs ordered are listed, but only abnormal results are displayed) Labs Reviewed  POCT RAPID STREP A (OFFICE)    EKG   Radiology No results found.  Procedures Procedures (including critical care time)  Medications Ordered in UC Medications - No data to display  Initial Impression / Assessment and Plan / UC Course  I have reviewed the triage vital signs and the nursing notes.  Pertinent labs & imaging results that were available during my care of the patient were reviewed by me and considered in my medical decision making (see chart for details).  Acute sinusitis.  Patient has been symptomatic for 2 weeks.  Treating with amoxicillin.  Education provided on sinusitis.  Instructed patient to follow-up with his PCP if his symptoms are not improving.  He agrees to plan of care.   Final Clinical Impressions(s) / UC Diagnoses   Final diagnoses:  Acute non-recurrent maxillary sinusitis     Discharge Instructions      Take the amoxicillin as directed.    Follow up with your primary  care provider if your symptoms are not improving.         ED Prescriptions     Medication Sig Dispense Auth. Provider   amoxicillin (AMOXIL) 875 MG tablet Take 1 tablet (875 mg total) by mouth 2 (two) times daily for 7 days. 14 tablet Sharion Balloon, NP      PDMP not reviewed this encounter.   Sharion Balloon, NP 03/10/21 1047

## 2021-03-10 NOTE — Discharge Instructions (Addendum)
Take the amoxicillin as directed.  Follow up with your primary care provider if your symptoms are not improving.   ° ° °

## 2021-03-10 NOTE — ED Triage Notes (Signed)
Onset 2 weeks ago of aches and pains, sore throat, headache

## 2021-03-17 ENCOUNTER — Telehealth: Payer: Self-pay | Admitting: Family Medicine

## 2021-03-17 NOTE — Telephone Encounter (Signed)
Patient calling back in to check status of message

## 2021-03-17 NOTE — Telephone Encounter (Signed)
Patient was sent to Urgent Care last week and was given a antibiotic. Patient states he has finished his medication and still has a stuffy nose. Patient was told by Urgent care if not better to call his provider.  Teddi Badalamenti,cma

## 2021-03-17 NOTE — Telephone Encounter (Signed)
Can you clarify if he is a little stuffy or if he is severely congested?  He will likely need to complete a follow-up with [place] to determine appropriate treatment.

## 2021-03-17 NOTE — Telephone Encounter (Signed)
Patient was sent to Urgent Care last week and was given a antibiotic. Patient states he has finished his medication and still has a stuffy nose. Patient was told by Urgent care if not better to call his provider.

## 2021-03-18 ENCOUNTER — Encounter: Payer: Self-pay | Admitting: Emergency Medicine

## 2021-03-18 ENCOUNTER — Ambulatory Visit (INDEPENDENT_AMBULATORY_CARE_PROVIDER_SITE_OTHER): Payer: PPO

## 2021-03-18 ENCOUNTER — Other Ambulatory Visit: Payer: Self-pay

## 2021-03-18 ENCOUNTER — Ambulatory Visit
Admission: EM | Admit: 2021-03-18 | Discharge: 2021-03-18 | Disposition: A | Discharge status: Home / Self Care | Payer: PPO | Attending: Emergency Medicine | Admitting: Emergency Medicine

## 2021-03-18 ENCOUNTER — Telehealth: Payer: Self-pay | Admitting: Family Medicine

## 2021-03-18 DIAGNOSIS — J209 Acute bronchitis, unspecified: Secondary | ICD-10-CM

## 2021-03-18 DIAGNOSIS — R059 Cough, unspecified: Secondary | ICD-10-CM | POA: Diagnosis not present

## 2021-03-18 DIAGNOSIS — J0100 Acute maxillary sinusitis, unspecified: Secondary | ICD-10-CM

## 2021-03-18 MED ORDER — LEVOFLOXACIN 750 MG PO TABS: 750.0000 mg | ORAL_TABLET | Freq: Every day | ORAL | 0 refills | Status: AC

## 2021-03-18 MED ORDER — AEROCHAMBER PLUS MISC: 2 refills | Status: DC

## 2021-03-18 MED ORDER — ALBUTEROL SULFATE HFA 108 (90 BASE) MCG/ACT IN AERS: 1.0000 | INHALATION_SPRAY | RESPIRATORY_TRACT | 0 refills | Status: DC | PRN

## 2021-03-18 MED ORDER — PREDNISONE 20 MG PO TABS: 40.0000 mg | ORAL_TABLET | Freq: Every day | ORAL | 0 refills | Status: AC

## 2021-03-18 NOTE — ED Provider Notes (Signed)
HPI  SUBJECTIVE:  Karl Myers is a 75 y.o. male who presents with persistent headache, sinus pain and pressure, nasal congestion, rhinorrhea, postnasal drip cough for the past 3 weeks.  Questionable allergy symptoms with itchy, watery eyes.  He was seen here on 10/3 with 2 weeks of sinus congestion, postnasal drip, sore throat, body aches, headaches, cough.  He was thought to have maxillary sinusitis and was sent home with amoxicillin.  He has also tried Mucinex D and Allegra-D without improvement in his symptoms.  There are no aggravating or alleviating factors.  No fevers, body aches, facial swelling, upper dental pain, wheezing, chest pain, shortness of breath. Past medical history of hypertension, atrial fibrillation on Eliquis, COPD, prediabetes, COVID in 2019.  No history of allergies, pulmonary disease, frequent sinusitis, prolonged QT syndrome.  He has tolerated Cipro previously without any problem.  [id number], Angela Adam, MD   Past Medical History:  Diagnosis Date   Actinic keratosis    Arthritis    COPD (chronic obstructive pulmonary disease) ([place])    DDD (degenerative disc disease), cervical    DDD (degenerative disc disease), cervical    Depression    History of COVID-19    Hypertension    Melanoma ([place]) 09/04/2019   R 5th toe lateral tip, Stage IV, 0.87mm Breslow's   Pre-diabetes     Past Surgical History:  Procedure Laterality Date   BACK SURGERY     x 3   CARPAL TUNNEL RELEASE Right 08/19/2017   Procedure: CARPAL TUNNEL RELEASE;  Surgeon: Hessie Knows, MD;  Location: [facility];  Service: Orthopedics;  Laterality: Right;   CATARACT EXTRACTION W/PHACO Right 11/17/2016   Procedure: CATARACT EXTRACTION PHACO AND INTRAOCULAR LENS PLACEMENT (IOC);  Surgeon: Birder Robson, MD;  Location: [facility];  Service: Ophthalmology;  Laterality: Right;  [place] 00:37 AP% 12.6 CDE 4.69 Fluid pack lot # [id number]   CATARACT EXTRACTION W/PHACO Left 12/08/2016   Procedure: CATARACT  EXTRACTION PHACO AND INTRAOCULAR LENS PLACEMENT (IOC);  Surgeon: Birder Robson, MD;  Location: [facility];  Service: Ophthalmology;  Laterality: Left;  [place] 00:33 AP% 13.9 CDE 4.63 Fluid pack lot # [id number]   ULNAR TUNNEL RELEASE Right 08/19/2017   Procedure: CUBITAL TUNNEL RELEASE;  Surgeon: Hessie Knows, MD;  Location: [facility];  Service: Orthopedics;  Laterality: Right;    Family History  Problem Relation Age of Onset   Heart attack Mother    Prostate cancer Father    Breast cancer Sister 58   Dementia Sister    Cancer Brother        unknown what kind    Social History   Tobacco Use   Smoking status: Every Day    Packs/day: 1.00    Types: Cigarettes   Smokeless tobacco: Never  Vaping Use   Vaping Use: Never used  Substance Use Topics   Alcohol use: No   Drug use: No    No current facility-administered medications for this encounter.  Current Outpatient Medications:    albuterol (VENTOLIN HFA) 108 (90 Base) MCG/ACT inhaler, Inhale 1-2 puffs into the lungs every 4 (four) hours as needed for wheezing or shortness of breath., Disp: 1 each, Rfl: 0   ALPRAZolam (XANAX) 0.5 MG tablet, TAKE ONE-HALF TABLET BY MOUTH EVERY MORNING AND TAKE ONE TABLET BY MOUTH AT BEDTIME AS DIRECTED, Disp: 45 tablet, Rfl: 1   apixaban (ELIQUIS) 5 MG TABS tablet, Take 1 tablet (5 mg total) by mouth 2 (two) times daily. NO FURTHER REFILLS UNTIL YOU SEE  DR. Rockey Situ, Disp: 30 tablet, Rfl: 0   atenolol (TENORMIN) 25 MG tablet, TAKE ONE TABLET AT BEDTIME, Disp: 90 tablet, Rfl: 3   atorvastatin (LIPITOR) 80 MG tablet, TAKE 1 TABLET BY MOUTH DAILY., Disp: 90 tablet, Rfl: 1   cholecalciferol (VITAMIN D3) 25 MCG (1000 UNIT) tablet, Take 1,000 Units by mouth daily. , Disp: , Rfl:    escitalopram (LEXAPRO) 5 MG tablet, TAKE ONE TABLET BY MOUTH EVERY DAY, Disp: 90 tablet, Rfl: 1   ketoconazole (NIZORAL) 2 % cream, Apply to the feet and between the toes QHS., Disp: 60 g, Rfl: 3   levofloxacin (LEVAQUIN) 750 MG  tablet, Take 1 tablet (750 mg total) by mouth daily for 7 days., Disp: 7 tablet, Rfl: 0   lisinopril (ZESTRIL) 10 MG tablet, TAKE ONE TABLET AT BEDTIME, Disp: 90 tablet, Rfl: 3   nitroGLYCERIN (NITROSTAT) 0.4 MG SL tablet, Place 1 tablet (0.4 mg total) under the tongue every 5 (five) minutes as needed for chest pain. For up to 3 doses per episode., Disp: 50 tablet, Rfl: 0   predniSONE (DELTASONE) 20 MG tablet, Take 2 tablets (40 mg total) by mouth daily with breakfast for 5 days., Disp: 10 tablet, Rfl: 0   Spacer/Aero-Holding Chambers (AEROCHAMBER PLUS) inhaler, Use with inhaler, Disp: 1 each, Rfl: 2   tiZANidine (ZANAFLEX) 4 MG tablet, Take 1 tablet (4 mg total) by mouth every 6 (six) hours as needed for muscle spasms., Disp: 30 tablet, Rfl: 0   traMADol (ULTRAM) 50 MG tablet, Take by mouth., Disp: , Rfl:    traZODone (DESYREL) 150 MG tablet, TAKE ONE TABLET AT BEDTIME, Disp: 90 tablet, Rfl: 1   vitamin C (ASCORBIC ACID) 250 MG tablet, Take 250 mg by mouth daily. , Disp: , Rfl:    Zinc 100 MG TABS, Take 100 mg by mouth daily. , Disp: , Rfl:    gabapentin (NEURONTIN) 100 MG capsule, Take by mouth., Disp: , Rfl:    mupirocin ointment (BACTROBAN) 2 %, Apply 1 application topically daily. With dressing changes, Disp: 22 g, Rfl: 0   nicotine (NICODERM CQ - DOSED IN MG/24 HOURS) 21 mg/24hr patch, Place 1 patch (21 mg total) onto the skin daily. For 6 weeks. Call for next step once on the last week., Disp: 42 patch, Rfl: 0   triamcinolone ointment (KENALOG) 0.1 %, Apply to neck twice a day for 2 weeks, Disp: 60 g, Rfl: 0  Allergies  Allergen Reactions   Hydrocodone-Acetaminophen Other (See Comments)    With large quantities "hyper"   Neurontin [Gabapentin] Other (See Comments)    With large quantities "hyper"      ROS  As noted in HPI.   Physical Exam  Pulse 83   Temp 98.3 F (36.8 C) (Oral)   Resp 18   SpO2 93%   Constitutional: Well developed, well nourished, no acute distress Eyes:   EOMI, conjunctiva normal bilaterally HENT: Normocephalic, atraumatic,mucus membranes moist.  Erythematous, swollen turbinates.  Purulent nasal congestion.  Positive maxillary sinus tenderness.  Unable to completely visualize oropharynx. Respiratory: Normal inspiratory effort, rhonchi left lower lobe. Cardiovascular: Normal rate, regular rhythm, no murmurs rubs or gallops GI: nondistended skin: No rash, skin intact Musculoskeletal: no deformities Neurologic: Alert & oriented x 3, no focal neuro deficits Psychiatric: Speech and behavior appropriate   ED Course   Medications - No data to display  Orders Placed This Encounter  Procedures   DG Chest 2 View    Standing Status:   Standing  Number of Occurrences:   1    Order Specific Question:   Reason for Exam (SYMPTOM  OR DIAGNOSIS REQUIRED)    Answer:   Cough 3 weeks, rhonchi left lower lobe.  Rule out pneumonia    No results found for this or any previous visit (from the past 24 hour(s)). DG Chest 2 View  Result Date: 03/18/2021 CLINICAL DATA:  Cough for 3 weeks. Left lower lobe bronchi. Clinical concern for pneumonia. EXAM: CHEST - 2 VIEW COMPARISON:  Radiographs 10/13/2019.  CT 05/06/2014. FINDINGS: The heart size and mediastinal contours are stable. Diffuse interstitial prominence has increased compared with the prior study. Although this change could be technical, there is probable central airway thickening, and bronchitis is suspected. The pulmonary vascularity appears normal. There is no definite edema, confluent airspace opacity, pleural effusion or pneumothorax. Mild degenerative changes are present throughout the spine. IMPRESSION: Interval increased interstitial prominence with probable central airway thickening, suspicious for bronchitis. No evidence of pneumonia. Electronically Signed   By: Richardean Sale M.D.   On: 03/18/2021 15:42    ED Clinical Impression  1. Acute non-recurrent maxillary sinusitis   2. Acute  bronchitis, unspecified organism      ED Assessment/Plan  Previous records reviewed.  As noted in HPI.  Will check chest x-ray given focal lung findings and oxygen saturation of 93% as a baseline.  Plan to send home with second line antibiotic for sinusitis that did not respond to initial antibiotic- Levaquin 750 mg p.o. for 7 days which will also take care of pneumonia.  Flonase, saline nasal irrigation.    Imaging independently reviewed.  Chest x-ray with probable central airway thickening, suspicious for bronchitis.  See radiology report for details.  X-ray suspicious for bronchitis.  Home with prednisone, albuterol inhaler with a spacer to help with cough.  Follow-up with PMD as needed, especially if not getting better after finishing the antibiotics.  Discussed  imaging, MDM, treatment plan, and plan for follow-up with patient. Discussed sn/sx that should prompt return to the ED. patient agrees with plan.   Meds ordered this encounter  Medications   predniSONE (DELTASONE) 20 MG tablet    Sig: Take 2 tablets (40 mg total) by mouth daily with breakfast for 5 days.    Dispense:  10 tablet    Refill:  0   albuterol (VENTOLIN HFA) 108 (90 Base) MCG/ACT inhaler    Sig: Inhale 1-2 puffs into the lungs every 4 (four) hours as needed for wheezing or shortness of breath.    Dispense:  1 each    Refill:  0   Spacer/Aero-Holding Chambers (AEROCHAMBER PLUS) inhaler    Sig: Use with inhaler    Dispense:  1 each    Refill:  2    Please educate patient on use   levofloxacin (LEVAQUIN) 750 MG tablet    Sig: Take 1 tablet (750 mg total) by mouth daily for 7 days.    Dispense:  7 tablet    Refill:  0       *This clinic note was created using [employer]. Therefore, there may be occasional mistakes despite careful proofreading.  ?    Melynda Ripple, MD 03/18/21 [phone]

## 2021-03-18 NOTE — Telephone Encounter (Signed)
Noted.  He needs to complete a visit to receive specific treatment.  This could be virtual with any available provider.

## 2021-03-18 NOTE — Telephone Encounter (Signed)
Patient informed, Due to the high volume of calls and your symptoms we have to forward your call to our Triage Nurse to expedient your call. Please hold for the transfer.  Patient transferred to [facility] Nurse. Due to requesting a different medicine after still having congestion and a stuffy nose after being seen last week at the Urgent Care.No openings in office or virtual.

## 2021-03-18 NOTE — Telephone Encounter (Signed)
Attached is the access nurse note from below.

## 2021-03-18 NOTE — Telephone Encounter (Signed)
Patients wife called in to check on the status of the message below.Per CMA,patient has to been seen for an appointment or triaged.Offered to transfer patient to Access Nurse to be triaged but patient is not currently with his wife.She will call our office back for him to speak with the nurse to be triaged.

## 2021-03-18 NOTE — Telephone Encounter (Signed)
Unable to leave a message for patient to return call back. Patient needs to be evaluated, needs a virtual within or out of our office per PCP.

## 2021-03-18 NOTE — Telephone Encounter (Signed)
Patient was seen at an UC today for evaluation.

## 2021-03-18 NOTE — Discharge Instructions (Addendum)
Your x-ray was negative for pneumonia, but showed a likely bronchitis.  Prednisone and albuterol with a spacer should help with this.  2 puffs from your albuterol inhaler every 4-6 hours as needed for cough, wheezing, chest pain, shortness of breath.  I am sending home with the Levaquin to treat your sinus infection.  Saline nasal irrigation with a Milta Deiters Med rinse and distilled water as often as you want and Flonase to help with the sinus infection

## 2021-03-18 NOTE — ED Triage Notes (Signed)
Pt was seen here on 03/10/21 he is not better. He continues to have a cough, runny nose and sinus pressure.

## 2021-03-24 ENCOUNTER — Encounter: Payer: Self-pay | Admitting: Cardiovascular Disease

## 2021-03-24 ENCOUNTER — Ambulatory Visit: Payer: PPO | Admitting: Cardiovascular Disease

## 2021-03-24 ENCOUNTER — Other Ambulatory Visit: Payer: Self-pay | Admitting: Family Medicine

## 2021-03-24 ENCOUNTER — Other Ambulatory Visit: Payer: Self-pay

## 2021-03-24 VITALS — BP 148/80 | HR 74 | Ht 68.0 in | Wt 166.2 lb

## 2021-03-24 DIAGNOSIS — I10 Essential (primary) hypertension: Secondary | ICD-10-CM | POA: Diagnosis not present

## 2021-03-24 DIAGNOSIS — I480 Paroxysmal atrial fibrillation: Secondary | ICD-10-CM | POA: Diagnosis not present

## 2021-03-24 DIAGNOSIS — I25118 Atherosclerotic heart disease of native coronary artery with other forms of angina pectoris: Secondary | ICD-10-CM

## 2021-03-24 DIAGNOSIS — Z720 Tobacco use: Secondary | ICD-10-CM | POA: Diagnosis not present

## 2021-03-24 DIAGNOSIS — E782 Mixed hyperlipidemia: Secondary | ICD-10-CM

## 2021-03-24 MED ORDER — APIXABAN 5 MG PO TABS: 5.0000 mg | ORAL_TABLET | Freq: Two times a day (BID) | ORAL | 3 refills | Status: DC

## 2021-03-24 NOTE — Progress Notes (Signed)
Cardiology Office Note  Date:  03/24/2021   ID:  Karl Myers, Karl Myers 1945/11/29, MRN [id number]  PCP:  Leone Haven, MD   Chief Complaint  Patient presents with   12 month follow up     "Doing well." Medications reviewed by the patient verbally.     HPI:  Karl Myers is a 75 year old gentleman with past medical history of HTN Smoker, quit last month.  Smoked for 50 years or more Anxiety Coronary artery disease on previous CT scan Fatigue, with exertion, Who presents for f/u of his fatigue, coronary artery disease, afib in setting of covid  Last seen in clinic March 2021  Sinus issues, bronchitis, on prednisone (finished yesterday) Seen in urgent care 03/10/21 and 10/11 Finishing ABX today, Poor sleep  BP elevated today Better at home, "810 systolic"  On eliquis once a day, "causes nose bleeds", "bruising"  smoking again, 1 ppd Does not want chantix, tried all that mess  Previous lab work reviewed with him in detail Total chol 137, LDL 82 HBA1C 6.0  EKG personally reviewed by myself on todays visit Shows normal sinus rhythm rate 74 bpm no significant ST or T wave changes   covid 07/2019 bilateral multifocal infiltrates and hypoxia  remdesivir and steroids.  brief atrial fibrillation  Anticoagulation was started for stroke risk reduction and will continue with eliquis. 5th and final infusion of remdesivir on 2/19 and complete a course of oral decadron as outpatient. HbA1c 6.5%.   Stress test 07/30/2017, reviewed with him in detail There was no ST segment deviation noted during stress. The study is normal. This is a low risk study. The left ventricular ejection fraction is normal (55-65%).  works in the garden [employer]    PMH:   has a past medical history of Actinic keratosis, Arthritis, COPD (chronic obstructive pulmonary disease) ([place]), DDD (degenerative disc disease), cervical, DDD (degenerative disc disease), cervical, Depression, History  of COVID-19, Hypertension, Melanoma ([place]) (09/04/2019), and Pre-diabetes.  PSH:    Past Surgical History:  Procedure Laterality Date   BACK SURGERY     x 3   CARPAL TUNNEL RELEASE Right 08/19/2017   Procedure: CARPAL TUNNEL RELEASE;  Surgeon: Hessie Knows, MD;  Location: [facility];  Service: Orthopedics;  Laterality: Right;   CATARACT EXTRACTION W/PHACO Right 11/17/2016   Procedure: CATARACT EXTRACTION PHACO AND INTRAOCULAR LENS PLACEMENT (IOC);  Surgeon: Birder Robson, MD;  Location: [facility];  Service: Ophthalmology;  Laterality: Right;  [place] 00:37 AP% 12.6 CDE 4.69 Fluid pack lot # [id number]   CATARACT EXTRACTION W/PHACO Left 12/08/2016   Procedure: CATARACT EXTRACTION PHACO AND INTRAOCULAR LENS PLACEMENT (IOC);  Surgeon: Birder Robson, MD;  Location: [facility];  Service: Ophthalmology;  Laterality: Left;  [place] 00:33 AP% 13.9 CDE 4.63 Fluid pack lot # [id number]   ULNAR TUNNEL RELEASE Right 08/19/2017   Procedure: CUBITAL TUNNEL RELEASE;  Surgeon: Hessie Knows, MD;  Location: [facility];  Service: Orthopedics;  Laterality: Right;    Current Outpatient Medications  Medication Sig Dispense Refill   albuterol (VENTOLIN HFA) 108 (90 Base) MCG/ACT inhaler Inhale 1-2 puffs into the lungs every 4 (four) hours as needed for wheezing or shortness of breath. 1 each 0   ALPRAZolam (XANAX) 0.5 MG tablet TAKE ONE-HALF TABLET BY MOUTH EVERY MORNING AND TAKE ONE TABLET BY MOUTH AT BEDTIME AS DIRECTED 45 tablet 1   apixaban (ELIQUIS) 5 MG TABS tablet Take 1 tablet (5 mg total) by mouth 2 (two) times daily. NO FURTHER REFILLS  UNTIL YOU SEE DR. Litsy Epting 30 tablet 0   atenolol (TENORMIN) 25 MG tablet TAKE ONE TABLET AT BEDTIME 90 tablet 3   atorvastatin (LIPITOR) 80 MG tablet TAKE 1 TABLET BY MOUTH DAILY. 90 tablet 1   gabapentin (NEURONTIN) 100 MG capsule Take by mouth.     levofloxacin (LEVAQUIN) 750 MG tablet Take 1 tablet (750 mg total) by mouth daily for 7 days. 7 tablet 0   lisinopril (ZESTRIL) 10 MG  tablet TAKE ONE TABLET AT BEDTIME 90 tablet 3   mupirocin ointment (BACTROBAN) 2 % Apply 1 application topically daily. With dressing changes 22 g 0   nitroGLYCERIN (NITROSTAT) 0.4 MG SL tablet Place 1 tablet (0.4 mg total) under the tongue every 5 (five) minutes as needed for chest pain. For up to 3 doses per episode. 50 tablet 0   Spacer/Aero-Holding Chambers (AEROCHAMBER PLUS) inhaler Use with inhaler 1 each 2   traZODone (DESYREL) 150 MG tablet TAKE ONE TABLET AT BEDTIME 90 tablet 1   escitalopram (LEXAPRO) 5 MG tablet TAKE ONE TABLET BY MOUTH EVERY DAY (Patient not taking: Reported on 03/24/2021) 90 tablet 1   ketoconazole (NIZORAL) 2 % cream Apply to the feet and between the toes QHS. (Patient not taking: Reported on 03/24/2021) 60 g 3   nicotine (NICODERM CQ - DOSED IN MG/24 HOURS) 21 mg/24hr patch Place 1 patch (21 mg total) onto the skin daily. For 6 weeks. Call for next step once on the last week. (Patient not taking: Reported on 03/24/2021) 42 patch 0   tiZANidine (ZANAFLEX) 4 MG tablet Take 1 tablet (4 mg total) by mouth every 6 (six) hours as needed for muscle spasms. (Patient not taking: Reported on 03/24/2021) 30 tablet 0   traMADol (ULTRAM) 50 MG tablet Take by mouth. (Patient not taking: Reported on 03/24/2021)     triamcinolone ointment (KENALOG) 0.1 % Apply to neck twice a day for 2 weeks (Patient not taking: Reported on 03/24/2021) 60 g 0   No current facility-administered medications for this visit.     Allergies:   Hydrocodone-acetaminophen and Neurontin [gabapentin]   Social History:  The patient  reports that he has been smoking cigarettes. He has been smoking an average of 1 pack per day. He has never used smokeless tobacco. He reports that he does not drink alcohol and does not use drugs.   Family History:   family history includes Breast cancer (age of onset: 18) in his sister; Cancer in his brother; Dementia in his sister; Heart attack in his mother; Prostate cancer in  his father.    Review of Systems: Review of Systems  Constitutional:  Positive for malaise/fatigue.  HENT: Negative.    Respiratory:  Positive for shortness of breath.   Cardiovascular: Negative.   Gastrointestinal: Negative.   Musculoskeletal: Negative.   Neurological: Negative.   Psychiatric/Behavioral: Negative.    All other systems reviewed and are negative.  PHYSICAL EXAM: VS:  BP (!) 148/80 (BP Location: Left Arm, Patient Position: Sitting, Cuff Size: Normal)   Pulse 74   Ht 5\' 8"  (1.727 m)   Wt 166 lb 4 oz (75.4 kg)   SpO2 95%   BMI 25.28 kg/m  , BMI Body mass index is 25.28 kg/m. Constitutional:  oriented to person, place, and time. No distress.  HENT:  Head: Grossly normal Eyes:  no discharge. No scleral icterus.  Neck: No JVD, no carotid bruits  Cardiovascular: Regular rate and rhythm, no murmurs appreciated Pulmonary/Chest: Clear to auscultation bilaterally, no  wheezes or rails Abdominal: Soft.  no distension.  no tenderness.  Musculoskeletal: Normal range of motion Neurological:  normal muscle tone. Coordination normal. No atrophy Skin: Skin warm and dry Psychiatric: normal affect, pleasant  Recent Labs: 08/05/2020: ALT 14 12/17/2020: BUN 13; Creatinine, Ser 1.17; Potassium 4.3; Sodium 139    Lipid Panel Lab Results  Component Value Date   CHOL 146 06/14/2020   HDL 44.30 06/14/2020   LDLCALC 84 06/14/2020   TRIG 89.0 06/14/2020      Wt Readings from Last 3 Encounters:  03/24/21 166 lb 4 oz (75.4 kg)  03/07/21 166 lb (75.3 kg)  12/17/20 166 lb (75.3 kg)      ASSESSMENT AND PLAN:  Coronary artery disease of native artery of native heart with stable angina pectoris (HCC) Significant coronary disease seen on CT scan 50 years of smoking, continues to smoke smoking cessation recommended Especially in light of bronchitis symptoms  Chronic fatigue -  Prior stress test with no ischemia 2019 Prior COVID We have recommended exercise  program  Tobacco abuse We have encouraged him to continue to work on weaning his cigarettes and smoking cessation. He will continue to work on this and does not want any assistance with chantix.    Essential hypertension - Plan: EKG 12-Lead BP elevated, recommend he monitor blood pressures at home  Bronchitis:  Going to urgent care Finishing prednisone, finished antibiotics Has not seen primary care yet Suspect COPD exacerbation, may need to set up with pulmonary, this was discussed with him  Mixed hyperlipidemia -  Cholesterol is at goal on the current lipid regimen. No changes to the medications were made.    Total encounter time more than 25 minutes  Greater than 50% was spent in counseling and coordination of care with the patient    No orders of the defined types were placed in this encounter.    Signed, Esmond Plants, M.D., Ph.D. 03/24/2021  [place], [place]

## 2021-03-24 NOTE — Patient Instructions (Addendum)
Medication Instructions:  No changes Take eliquis twice a day For any assistance with cost call [phone]-ELIQUIS  If you need a refill on your cardiac medications before your next appointment, please call your pharmacy.   Lab work: No new labs needed  Testing/Procedures: No new testing needed  Follow-Up: At [facility], you and your health needs are our priority.  As part of our continuing mission to provide you with exceptional heart care, we have created designated Provider Care Teams.  These Care Teams include your primary Cardiologist (physician) and Advanced Practice Providers (APPs -  Physician Assistants and Nurse Practitioners) who all work together to provide you with the care you need, when you need it.  You will need a follow up appointment in 12 months  Providers on your designated Care Team:   Murray Hodgkins, NP Christell Faith, PA-C Marrianne Mood, PA-C Cadence H[place], [place]  COVID-19 Vaccine Information can be found at: [email or web address].uk For questions related to vaccine distribution or appointments, please email vaccine@[email or web address] or call [phone].   Please monitor blood pressures and keep a log of your readings.   How to use a home blood pressure monitor. Be still. Measure at the same time every day. It's important to take the readings at the same time each day, such as morning and evening. Take reading approximately 1 1/2 to 2 hours after BP medications.   AVOID these things for 30 minutes before checking your blood pressure: Drinking caffeine. Drinking alcohol. Eating. Smoking. Exercising.

## 2021-04-21 ENCOUNTER — Other Ambulatory Visit: Payer: Self-pay

## 2021-04-21 ENCOUNTER — Ambulatory Visit (INDEPENDENT_AMBULATORY_CARE_PROVIDER_SITE_OTHER): Payer: PPO

## 2021-04-21 DIAGNOSIS — Z23 Encounter for immunization: Secondary | ICD-10-CM

## 2021-05-06 ENCOUNTER — Other Ambulatory Visit: Payer: Self-pay | Admitting: Family Medicine

## 2021-05-06 NOTE — Telephone Encounter (Signed)
RX Refill: xanax Last Seen: 12-17-20 Last Ordered: 03-28-21 Next Appt: 07-07-21

## 2021-05-07 ENCOUNTER — Ambulatory Visit: Payer: PPO | Admitting: Dermatology

## 2021-05-07 ENCOUNTER — Other Ambulatory Visit: Payer: Self-pay

## 2021-05-07 DIAGNOSIS — L570 Actinic keratosis: Secondary | ICD-10-CM | POA: Diagnosis not present

## 2021-05-07 DIAGNOSIS — B353 Tinea pedis: Secondary | ICD-10-CM

## 2021-05-07 DIAGNOSIS — D1801 Hemangioma of skin and subcutaneous tissue: Secondary | ICD-10-CM | POA: Diagnosis not present

## 2021-05-07 DIAGNOSIS — L820 Inflamed seborrheic keratosis: Secondary | ICD-10-CM

## 2021-05-07 DIAGNOSIS — Z85820 Personal history of malignant melanoma of skin: Secondary | ICD-10-CM | POA: Diagnosis not present

## 2021-05-07 DIAGNOSIS — L821 Other seborrheic keratosis: Secondary | ICD-10-CM

## 2021-05-07 DIAGNOSIS — Z1283 Encounter for screening for malignant neoplasm of skin: Secondary | ICD-10-CM

## 2021-05-07 DIAGNOSIS — D229 Melanocytic nevi, unspecified: Secondary | ICD-10-CM

## 2021-05-07 DIAGNOSIS — L578 Other skin changes due to chronic exposure to nonionizing radiation: Secondary | ICD-10-CM

## 2021-05-07 DIAGNOSIS — L814 Other melanin hyperpigmentation: Secondary | ICD-10-CM

## 2021-05-07 DIAGNOSIS — D692 Other nonthrombocytopenic purpura: Secondary | ICD-10-CM

## 2021-05-07 MED ORDER — FLUOROURACIL 5 % EX CREA: TOPICAL_CREAM | Freq: Two times a day (BID) | CUTANEOUS | 1 refills | Status: DC

## 2021-05-07 MED ORDER — KETOCONAZOLE 2 % EX CREA: 1.0000 "application " | TOPICAL_CREAM | Freq: Every day | CUTANEOUS | 6 refills | Status: DC

## 2021-05-07 NOTE — Patient Instructions (Addendum)
If You Need Anything After Your Visit  If you have any questions or concerns for your doctor, please call our main line at [phone] and press option 4 to reach your doctor's medical assistant. If no one answers, please leave a voicemail as directed and we will return your call as soon as possible. Messages left after 4 pm will be answered the following business day.   You may also send us a message via MyChart. We typically respond to MyChart messages within 1-2 business days.  For prescription refills, please ask your pharmacy to contact our office. Our fax number is [phone].  If you have an urgent issue when the clinic is closed that cannot wait until the next business day, you can page your doctor at the number below.    Please note that while we do our best to be available for urgent issues outside of office hours, we are not available 24/7.   If you have an urgent issue and are unable to reach us, you may choose to seek medical care at your doctor's office, retail clinic, urgent care center, or emergency room.  If you have a medical emergency, please immediately call 911 or go to the emergency department.  Pager Numbers  - Dr. Kowalski: [phone]  - Dr. Moye: [phone]  - Dr. Stewart: [phone]  In the event of inclement weather, please call our main line at [phone] for an update on the status of any delays or closures.  Dermatology Medication Tips: Please keep the boxes that topical medications come in in order to help keep track of the instructions about where and how to use these. Pharmacies typically print the medication instructions only on the boxes and not directly on the medication tubes.   If your medication is too expensive, please contact our office at [phone] option 4 or send us a message through MyChart.   We are unable to tell what your co-pay for medications will be in advance as this is different depending on your insurance coverage.  However, we may be able to find a substitute medication at lower cost or fill out paperwork to get insurance to cover a needed medication.   If a prior authorization is required to get your medication covered by your insurance company, please allow us 1-2 business days to complete this process.  Drug prices often vary depending on where the prescription is filled and some pharmacies may offer cheaper prices.  The website www.goodrx.com contains coupons for medications through different pharmacies. The prices here do not account for what the cost may be with help from insurance (it may be cheaper with your insurance), but the website can give you the price if you did not use any insurance.  - You can print the associated coupon and take it with your prescription to the pharmacy.  - You may also stop by our office during regular business hours and pick up a GoodRx coupon card.  - If you need your prescription sent electronically to a different pharmacy, notify our office through [place] MyChart or by phone at [phone] option 4.     Si Usted Necesita Algo Despus de Su Visita  Tambin puede enviarnos un mensaje a travs de MyChart. Por lo general respondemos a los mensajes de MyChart en el transcurso de 1 a 2 das hbiles.  Para renovar recetas, por favor pida a su farmacia que se ponga en contacto con nuestra oficina. Nuestro nmero de fax es el [phone].  Si tiene   un asunto urgente cuando la clnica est cerrada y que no puede esperar hasta el siguiente da hbil, puede llamar/localizar a su doctor(a) al nmero que aparece a continuacin.   Por favor, tenga en cuenta que aunque hacemos todo lo posible para estar disponibles para asuntos urgentes fuera del horario de [place], no estamos disponibles las 24 horas del da, los 7 das de la [place].   Si tiene un problema urgente y no puede comunicarse con nosotros, puede optar por buscar atencin mdica  en el consultorio de su  doctor(a), en una clnica privada, en un centro de atencin urgente o en una sala de emergencias.  Si tiene Engineering geologist, por favor llame inmediatamente al 911 o vaya a la sala de emergencias.  Nmeros de bper  - Dr. Nehemiah Massed: [phone]  - Dra. Moye: [phone]  - Dra. Nicole Kindred: [phone]  En caso de inclemencias del [place], por favor llame a Johnsie Kindred principal al [phone] para una actualizacin sobre el [place] de cualquier retraso o cierre.  Consejos para la medicacin en dermatologa: Por favor, guarde las cajas en las que vienen los medicamentos de uso tpico para ayudarle a seguir las instrucciones sobre dnde y cmo usarlos. Las farmacias generalmente imprimen las instrucciones del medicamento slo en las cajas y no directamente en los tubos del [place].   Si su medicamento es muy caro, por favor, pngase en contacto con Zigmund Daniel llamando al [phone] y presione la opcin 4 o envenos un mensaje a travs de Pharmacist, community.   No podemos decirle cul ser su copago por los medicamentos por adelantado ya que esto es diferente dependiendo de la cobertura de su seguro. Sin embargo, es posible que podamos encontrar un medicamento sustituto a Electrical engineer un formulario para que el seguro cubra el medicamento que se considera necesario.   Si se requiere una autorizacin previa para que su compaa de seguros [place] su medicamento, por favor permtanos de 1 a 2 das hbiles para completar este proceso.  Los precios de los medicamentos varan con frecuencia dependiendo del Environmental consultant de dnde se surte la receta y alguna farmacias pueden ofrecer precios ms baratos.  El sitio web www.goodrx.com tiene cupones para medicamentos de Airline pilot. Los precios aqu no tienen en cuenta lo que podra costar con la ayuda del seguro (puede ser ms barato con su seguro), pero el sitio web puede darle el precio si no utiliz Research scientist (physical sciences).  - Puede imprimir el cupn  correspondiente y llevarlo con su receta a la farmacia.  - Tambin puede pasar por nuestra oficina durante el horario de atencin regular y Charity fundraiser una tarjeta de cupones de GoodRx.  - Si necesita que su receta se enve electrnicamente a una farmacia diferente, informe a nuestra oficina a travs de MyChart de [place] o por telfono llamando al [phone] y presione la opcin 4.   Start 5-fluorouracil/calcipotriene cream twice a day for 7 days to affected areas including forehead, temples and crown sclap. Prescription sent to [facility]. Patient provided with contact information for pharmacy and advised the pharmacy will mail the prescription to their home. Patient provided with handout reviewing treatment course and side effects and advised to call or message [place] on MyChart with any concerns.

## 2021-05-07 NOTE — Progress Notes (Signed)
Follow-Up Visit   Subjective  Karl Myers is a 75 y.o. 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 The patient presents for Total-Body Skin Exam (TBSE) for skin cancer screening and mole check.The patient has spots, moles and lesions to be evaluated, some may be new or changing and the patient has concerns that these could be cancer.  The following portions of the chart were reviewed this encounter and updated as appropriate:   Tobacco  Allergies  Meds  Problems  Med Hx  Surg Hx  Fam Hx     Review of Systems:  No other skin or systemic complaints except as noted in HPI or Assessment and Plan.  Objective  Well appearing patient in no apparent distress; mood and affect are within normal limits.  A full examination was performed including scalp, head, eyes, ears, nose, lips, neck, chest, axillae, abdomen, back, buttocks, bilateral upper extremities, bilateral lower extremities, hands, feet, fingers, toes, fingernails, and toenails. All findings within normal limits unless otherwise noted below.  R 5th toe lateral tip Well healed scar with no evidence of recurrence, no lymphadenopathy.   trunk, arms x 5, Total = 5 (5) Erythematous keratotic or waxy stuck-on papule or plaque.   face x 2 (2) Pink scaly macules   Right Foot - Anterior Toenail dystrophy, scaling bil feet   Assessment & Plan   Lentigines - Scattered tan macules - Due to sun exposure - Benign-appearing, observe - Recommend daily broad spectrum sunscreen SPF 30+ to sun-exposed areas, reapply every 2 hours as needed. - Call for any changes  Seborrheic Keratoses - Stuck-on, waxy, tan-brown papules and/or plaques  - Benign-appearing - Discussed benign etiology and prognosis. - Observe - Call for any changes  Melanocytic Nevi - Tan-brown and/or pink-flesh-colored symmetric macules and  papules - Benign appearing on exam today - Observation - Call clinic for new or changing moles - Recommend daily use of broad spectrum spf 30+ sunscreen to sun-exposed areas.   Hemangiomas - Red papules - Discussed benign nature - Observe - Call for any changes  Actinic Damage - Severe, confluent actinic changes with pre-cancerous actinic keratoses  - Severe, chronic, not at goal, secondary to cumulative UV radiation exposure over time - diffuse scaly erythematous macules and papules with underlying dyspigmentation - Discussed Prescription "Field Treatment" for Severe, Chronic Confluent Actinic Changes with Pre-Cancerous Actinic Keratoses Field treatment involves treatment of an entire area of skin that has confluent Actinic Changes (Sun/ Ultraviolet light damage) and PreCancerous Actinic Keratoses by method of PhotoDynamic Therapy (PDT) and/or prescription Topical Chemotherapy agents such as 5-fluorouracil, 5-fluorouracil/calcipotriene, and/or imiquimod.  The purpose is to decrease the number of clinically evident and subclinical PreCancerous lesions to prevent progression to development of skin cancer by chemically destroying early precancer changes that may or may not be visible.  It has been shown to reduce the risk of developing skin cancer in the treated area. As a result of treatment, redness, scaling, crusting, and open sores may occur during treatment course. One or more than one of these methods may be used and may have to be used several times to control, suppress and eliminate the PreCancerous changes. Discussed treatment course, expected reaction, and possible side effects. - Recommend daily broad spectrum sunscreen SPF 30+ to sun-exposed areas, reapply every 2 hours as needed.  - Staying in the shade or wearing long sleeves,  sun glasses (UVA+UVB protection) and wide brim hats (4-inch brim around the entire circumference of the hat) are also recommended. - Call for new or changing  lesions.  -Start 5-fluorouracil/calcipotriene cream twice a day for 7 days to affected areas including forehead, temples and crown scalp. Prescription sent to [facility]. Patient provided with contact information for pharmacy and advised the pharmacy will mail the prescription to their home. Patient provided with handout reviewing treatment course and side effects and advised to call or message [place] on MyChart with any concerns.   Skin cancer screening performed today.  Purpura - Chronic; persistent and recurrent.  Treatable, but not curable. - Violaceous macules and patches - Benign - Related to trauma, age, sun damage and/or use of blood thinners, chronic use of topical and/or oral steroids - Observe - Can use OTC arnica containing moisturizer such as Dermend Bruise Formula if desired - Call for worsening or other concerns  History of melanoma R 5th toe lateral tip R 5th toe lateral tip, Stage IV, 0.89mm Breslow's Exc 09/18/19 Sentinel lymph node, right inguinal, removal no malignancy identified, no melanoma found.  Clear.  No lymphadenopathy.  Observe for recurrence. Call clinic for new or changing lesions.  Recommend regular skin exams, daily broad-spectrum spf 30+ sunscreen use, and photoprotection.    Related Medications mupirocin ointment (BACTROBAN) 2 % Apply 1 application topically daily. With dressing changes  Inflamed seborrheic keratosis trunk, arms x 5, Total = 5 Destruction of lesion - trunk, arms x 5, Total = 5 Complexity: simple   Destruction method: cryotherapy   Informed consent: discussed and consent obtained   Timeout:  patient name, date of birth, surgical site, and procedure verified Lesion destroyed using liquid nitrogen: Yes   Region frozen until ice ball extended beyond lesion: Yes   Outcome: patient tolerated procedure well with no complications   Post-procedure details: wound care instructions given    AK (actinic keratosis) (2) face x  2 Destruction of lesion - face x 2 Complexity: simple   Destruction method: cryotherapy   Informed consent: discussed and consent obtained   Timeout:  patient name, date of birth, surgical site, and procedure verified Lesion destroyed using liquid nitrogen: Yes   Region frozen until ice ball extended beyond lesion: Yes   Outcome: patient tolerated procedure well with no complications   Post-procedure details: wound care instructions given    fluorouracil (EFUDEX) 5 % cream - face x 2 Apply topically 2 (two) times daily. 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 Start Ketoconazole 2% cr qhs to feet and nails  ketoconazole (NIZORAL) 2 % cream - Right Foot - Anterior Apply 1 application topically daily. Qhs to feet, in between toes and over toenails  Skin cancer screening  Return in about 4 months (around 09/04/2021) for TBSE, Hx of Melanoma, Hx of AKs.  I, [name], RMA, am acting as scribe for [name], MD . Documentation: I have reviewed the above documentation for accuracy and completeness, and I agree with the above.  [name], MD

## 2021-05-14 ENCOUNTER — Encounter: Payer: Self-pay | Admitting: Dermatology

## 2021-05-22 ENCOUNTER — Encounter: Payer: Self-pay | Admitting: Emergency Medicine

## 2021-05-22 ENCOUNTER — Ambulatory Visit (INDEPENDENT_AMBULATORY_CARE_PROVIDER_SITE_OTHER): Payer: PPO

## 2021-05-22 ENCOUNTER — Encounter: Payer: Self-pay | Admitting: Intensive Care

## 2021-05-22 ENCOUNTER — Emergency Department: Payer: PPO

## 2021-05-22 ENCOUNTER — Observation Stay
Admission: EM | Admit: 2021-05-22 | Discharge: 2021-05-23 | Disposition: A | Discharge status: Home / Self Care | Payer: PPO | Attending: Internal Medicine | Admitting: Internal Medicine

## 2021-05-22 ENCOUNTER — Other Ambulatory Visit: Payer: Self-pay

## 2021-05-22 ENCOUNTER — Ambulatory Visit
Admission: EM | Admit: 2021-05-22 | Discharge: 2021-05-22 | Payer: PPO | Attending: Family Medicine | Admitting: Family Medicine

## 2021-05-22 DIAGNOSIS — R0602 Shortness of breath: Secondary | ICD-10-CM

## 2021-05-22 DIAGNOSIS — E785 Hyperlipidemia, unspecified: Secondary | ICD-10-CM | POA: Insufficient documentation

## 2021-05-22 DIAGNOSIS — I10 Essential (primary) hypertension: Secondary | ICD-10-CM | POA: Insufficient documentation

## 2021-05-22 DIAGNOSIS — M5412 Radiculopathy, cervical region: Secondary | ICD-10-CM | POA: Diagnosis present

## 2021-05-22 DIAGNOSIS — Z79899 Other long term (current) drug therapy: Secondary | ICD-10-CM | POA: Diagnosis not present

## 2021-05-22 DIAGNOSIS — R4182 Altered mental status, unspecified: Secondary | ICD-10-CM | POA: Diagnosis not present

## 2021-05-22 DIAGNOSIS — I2510 Atherosclerotic heart disease of native coronary artery without angina pectoris: Secondary | ICD-10-CM | POA: Diagnosis not present

## 2021-05-22 DIAGNOSIS — R9389 Abnormal findings on diagnostic imaging of other specified body structures: Secondary | ICD-10-CM

## 2021-05-22 DIAGNOSIS — R059 Cough, unspecified: Secondary | ICD-10-CM | POA: Diagnosis not present

## 2021-05-22 DIAGNOSIS — I214 Non-ST elevation (NSTEMI) myocardial infarction: Secondary | ICD-10-CM | POA: Diagnosis not present

## 2021-05-22 DIAGNOSIS — J101 Influenza due to other identified influenza virus with other respiratory manifestations: Secondary | ICD-10-CM

## 2021-05-22 DIAGNOSIS — J441 Chronic obstructive pulmonary disease with (acute) exacerbation: Secondary | ICD-10-CM | POA: Diagnosis not present

## 2021-05-22 DIAGNOSIS — I959 Hypotension, unspecified: Secondary | ICD-10-CM

## 2021-05-22 DIAGNOSIS — F17210 Nicotine dependence, cigarettes, uncomplicated: Secondary | ICD-10-CM | POA: Insufficient documentation

## 2021-05-22 DIAGNOSIS — R410 Disorientation, unspecified: Secondary | ICD-10-CM

## 2021-05-22 DIAGNOSIS — R509 Fever, unspecified: Secondary | ICD-10-CM | POA: Diagnosis not present

## 2021-05-22 DIAGNOSIS — Z720 Tobacco use: Secondary | ICD-10-CM | POA: Diagnosis present

## 2021-05-22 DIAGNOSIS — Z20822 Contact with and (suspected) exposure to covid-19: Secondary | ICD-10-CM | POA: Diagnosis not present

## 2021-05-22 DIAGNOSIS — I4891 Unspecified atrial fibrillation: Secondary | ICD-10-CM | POA: Insufficient documentation

## 2021-05-22 DIAGNOSIS — G319 Degenerative disease of nervous system, unspecified: Secondary | ICD-10-CM | POA: Insufficient documentation

## 2021-05-22 LAB — HEPATIC FUNCTION PANEL
ALT: 17 U/L (ref 0–44)
AST: 30 U/L (ref 15–41)
Albumin: 4.3 g/dL (ref 3.5–5.0)
Alkaline Phosphatase: 55 U/L (ref 38–126)
Bilirubin, Direct: <0.1 mg/dL (ref 0.0–0.2)
Total Bilirubin: 0.5 mg/dL (ref 0.3–1.2)
Total Protein: 6.9 g/dL (ref 6.5–8.1)

## 2021-05-22 LAB — BASIC METABOLIC PANEL
Anion gap: 7 (ref 5–15)
BUN: 21 mg/dL (ref 8–23)
CO2: 28 mmol/L (ref 22–32)
Calcium: 8.5 mg/dL — ABNORMAL LOW (ref 8.9–10.3)
Chloride: 102 mmol/L (ref 98–111)
Creatinine, Ser: 1.06 mg/dL (ref 0.61–1.24)
GFR, Estimated: >60 mL/min (ref >60)
Glucose, Bld: 81 mg/dL (ref 70–99)
Potassium: 4.2 mmol/L (ref 3.5–5.1)
Sodium: 137 mmol/L (ref 135–145)

## 2021-05-22 LAB — CBC
HCT: 43.2 % (ref 39.0–52.0)
Hemoglobin: 14.3 g/dL (ref 13.0–17.0)
MCH: 33.6 pg (ref 26.0–34.0)
MCHC: 33.1 g/dL (ref 30.0–36.0)
MCV: 101.6 fL — ABNORMAL HIGH (ref 80.0–100.0)
Platelets: 139 10*3/uL — ABNORMAL LOW (ref 150–400)
RBC: 4.25 MIL/uL (ref 4.22–5.81)
RDW: 14.4 % (ref 11.5–15.5)
WBC: 8.5 10*3/uL (ref 4.0–10.5)
nRBC: 0 % (ref 0.0–0.2)

## 2021-05-22 LAB — PROCALCITONIN: Procalcitonin: <0.1 ng/mL

## 2021-05-22 LAB — LACTIC ACID, PLASMA: Lactic Acid, Venous: 1.4 mmol/L (ref 0.5–1.9)

## 2021-05-22 LAB — LIPASE, BLOOD: Lipase: 43 U/L (ref 11–51)

## 2021-05-22 LAB — TSH: TSH: 0.194 u[IU]/mL — ABNORMAL LOW (ref 0.350–4.500)

## 2021-05-22 LAB — RESP PANEL BY RT-PCR (FLU A&B, COVID) ARPGX2
Influenza A by PCR: POSITIVE — AB
Influenza B by PCR: NEGATIVE
SARS Coronavirus 2 by RT PCR: NEGATIVE

## 2021-05-22 LAB — PROTIME-INR
INR: 1.1 (ref 0.8–1.2)
Prothrombin Time: 13.7 seconds (ref 11.4–15.2)

## 2021-05-22 LAB — TROPONIN I (HIGH SENSITIVITY)
Troponin I (High Sensitivity): 116 ng/L (ref <18)
Troponin I (High Sensitivity): 103 ng/L (ref <18)

## 2021-05-22 MED ORDER — ACETAMINOPHEN 325 MG PO TABS: 650.0000 mg | ORAL_TABLET | Freq: Four times a day (QID) | ORAL | Status: DC | PRN

## 2021-05-22 MED ORDER — IPRATROPIUM-ALBUTEROL 0.5-2.5 (3) MG/3ML IN SOLN
9.0000 mL | Freq: Once | RESPIRATORY_TRACT | Status: AC
  Administered 2021-05-22: 9 mL via RESPIRATORY_TRACT
  Filled 2021-05-22: qty 9

## 2021-05-22 MED ORDER — APIXABAN 5 MG PO TABS
5.0000 mg | ORAL_TABLET | Freq: Two times a day (BID) | ORAL | Status: DC
  Filled 2021-05-22: qty 1

## 2021-05-22 MED ORDER — ASPIRIN 81 MG PO CHEW
324.0000 mg | CHEWABLE_TABLET | Freq: Once | ORAL | Status: AC
  Administered 2021-05-22: 324 mg via ORAL
  Filled 2021-05-22: qty 4

## 2021-05-22 MED ORDER — NICOTINE 14 MG/24HR TD PT24
14.0000 mg | MEDICATED_PATCH | Freq: Every day | TRANSDERMAL | Status: DC
  Administered 2021-05-22: 14 mg via TRANSDERMAL
  Filled 2021-05-22 (×2): qty 1

## 2021-05-22 MED ORDER — NITROGLYCERIN 0.4 MG SL SUBL: 0.4000 mg | SUBLINGUAL_TABLET | SUBLINGUAL | Status: DC | PRN

## 2021-05-22 MED ORDER — ONDANSETRON HCL 4 MG/2ML IJ SOLN: 4.0000 mg | Freq: Four times a day (QID) | INTRAMUSCULAR | Status: DC | PRN

## 2021-05-22 MED ORDER — OSELTAMIVIR PHOSPHATE 75 MG PO CAPS
75.0000 mg | ORAL_CAPSULE | Freq: Once | ORAL | Status: AC
  Administered 2021-05-22: 75 mg via ORAL
  Filled 2021-05-22 (×2): qty 1

## 2021-05-22 MED ORDER — BENZONATATE 100 MG PO CAPS
100.0000 mg | ORAL_CAPSULE | Freq: Once | ORAL | Status: AC
  Administered 2021-05-22: 100 mg via ORAL
  Filled 2021-05-22: qty 1

## 2021-05-22 MED ORDER — METHYLPREDNISOLONE SODIUM SUCC 125 MG IJ SOLR
125.0000 mg | Freq: Once | INTRAMUSCULAR | Status: AC
  Administered 2021-05-22: 125 mg via INTRAVENOUS
  Filled 2021-05-22: qty 2

## 2021-05-22 MED ORDER — ATORVASTATIN CALCIUM 20 MG PO TABS: 80.0000 mg | ORAL_TABLET | Freq: Every day | ORAL | Status: DC

## 2021-05-22 MED ORDER — IPRATROPIUM-ALBUTEROL 0.5-2.5 (3) MG/3ML IN SOLN
3.0000 mL | Freq: Four times a day (QID) | RESPIRATORY_TRACT | Status: DC
  Administered 2021-05-22: 3 mL via RESPIRATORY_TRACT
  Filled 2021-05-22 (×2): qty 3

## 2021-05-22 MED ORDER — METHYLPREDNISOLONE SODIUM SUCC 125 MG IJ SOLR
60.0000 mg | Freq: Two times a day (BID) | INTRAMUSCULAR | Status: DC
  Filled 2021-05-22: qty 2

## 2021-05-22 MED ORDER — GUAIFENESIN ER 600 MG PO TB12
600.0000 mg | ORAL_TABLET | Freq: Two times a day (BID) | ORAL | Status: DC
  Filled 2021-05-22: qty 1

## 2021-05-22 MED ORDER — ONDANSETRON HCL 4 MG PO TABS: 4.0000 mg | ORAL_TABLET | Freq: Four times a day (QID) | ORAL | Status: DC | PRN

## 2021-05-22 MED ORDER — LISINOPRIL 10 MG PO TABS: 10.0000 mg | ORAL_TABLET | Freq: Every day | ORAL | Status: DC

## 2021-05-22 MED ORDER — ATENOLOL 25 MG PO TABS: 25.0000 mg | ORAL_TABLET | Freq: Every day | ORAL | Status: DC

## 2021-05-22 MED ORDER — SODIUM CHLORIDE 0.9 % IV SOLN
500.0000 mg | INTRAVENOUS | Status: AC
  Administered 2021-05-22: 500 mg via INTRAVENOUS
  Filled 2021-05-22: qty 5

## 2021-05-22 MED ORDER — ACETAMINOPHEN 500 MG PO TABS
ORAL_TABLET | ORAL | Status: AC
  Filled 2021-05-22: qty 2

## 2021-05-22 MED ORDER — OSELTAMIVIR PHOSPHATE 75 MG PO CAPS
75.0000 mg | ORAL_CAPSULE | Freq: Two times a day (BID) | ORAL | Status: DC
  Filled 2021-05-22 (×2): qty 1

## 2021-05-22 MED ORDER — ALPRAZOLAM 0.5 MG PO TABS: 0.5000 mg | ORAL_TABLET | Freq: Every evening | ORAL | Status: DC | PRN

## 2021-05-22 MED ORDER — ACETAMINOPHEN 650 MG RE SUPP: 650.0000 mg | Freq: Four times a day (QID) | RECTAL | Status: DC | PRN

## 2021-05-22 MED ORDER — DOCUSATE SODIUM 100 MG PO CAPS
100.0000 mg | ORAL_CAPSULE | Freq: Two times a day (BID) | ORAL | Status: DC
  Filled 2021-05-22: qty 1

## 2021-05-22 MED ORDER — AZITHROMYCIN 500 MG PO TABS: 500.0000 mg | ORAL_TABLET | Freq: Every day | ORAL | Status: DC

## 2021-05-22 MED ORDER — PREDNISONE 20 MG PO TABS: 40.0000 mg | ORAL_TABLET | Freq: Every day | ORAL | Status: DC

## 2021-05-22 NOTE — ED Provider Notes (Addendum)
[facility] Emergency Department Provider Note  ____________________________________________   Event Date/Time   First MD Initiated Contact with Patient 05/22/21 1547     (approximate)  I have reviewed the triage vital signs and the nursing notes.   HISTORY  Chief Complaint Bloated, Altered Mental Status, and Abdominal Pain   HPI Karl Myers is a 75 y.o. male with a past medical history of paroxysmal A. fib on Eliquis, COPD with ongoing tobacco abuse, arthritis, DDD and some chronic back pain, HTN and prediabetes who presents of being referred to the ED from urgent care for further evaluation of approximately 2 days of cough, fevers, some  [place] tussive epigastric discomfort, and some confusion per wife.  Patient states he felt a little off for the last 2 mornings and per wife was at bedside he has been confused last 2 mornings but that his confusion proved throughout the day.  He has been compliant with his Eliquis.  His last measured temperature was 101 and it was earlier this morning.  Patient denies any injuries or falls, chest pain, change in his chronic back pain, lower abdominal pain, nausea, vomiting, diarrhea, rash or joint pain or sick symptoms.         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 Breslow's   Pre-diabetes     Patient Active Problem List   Diagnosis Date Noted   History of melanoma 10/13/2019   Atrial fibrillation ([place]) 08/01/2019   Low TSH level 08/01/2019   COPD (chronic obstructive pulmonary disease) ([place])    Anxiety    Lump of left breast 05/22/2019   Skin lesion 05/22/2019   Carpal tunnel syndrome of right wrist  12/14/2018   Family history of prostate cancer in father 06/17/2018   CAD (coronary artery disease) 07/27/2017   Tobacco abuse 06/24/2017   Hypertension 03/12/2017   Hyperlipidemia 03/12/2017   Fatigue 03/12/2017   Sleeping difficulty 03/12/2017   Cervical radiculopathy 03/12/2017    Past Surgical History:  Procedure Laterality Date   BACK SURGERY     x 3   CARPAL TUNNEL RELEASE Right 08/19/2017   Procedure: CARPAL TUNNEL RELEASE;  Surgeon: Hessie Knows, MD;  Location: [facility] ORS;  Service: Orthopedics;  Laterality: Right;   CATARACT EXTRACTION W/PHACO Right 11/17/2016   Procedure: CATARACT EXTRACTION PHACO AND INTRAOCULAR LENS PLACEMENT (IOC);  Surgeon: Birder Robson, MD;  Location: [facility] ORS;  Service: Ophthalmology;  Laterality: Right;  [place] 00:37 AP% 12.6 CDE 4.69 Fluid pack lot # [id number]   CATARACT EXTRACTION W/PHACO Left 12/08/2016   Procedure: CATARACT EXTRACTION PHACO AND INTRAOCULAR LENS PLACEMENT (IOC);  Surgeon: Birder Robson, MD;  Location: [facility] ORS;  Service: Ophthalmology;  Laterality: Left;  [place] 00:33 AP% 13.9 CDE 4.63 Fluid pack lot # [id number]   ULNAR TUNNEL RELEASE Right 08/19/2017   Procedure: CUBITAL TUNNEL RELEASE;  Surgeon: Hessie Knows, MD;  Location: [facility] ORS;  Service: Orthopedics;  Laterality: Right;    Prior to Admission medications   Medication Sig Start Date End Date Taking? Authorizing Provider  albuterol (VENTOLIN HFA) 108 (90 Base) MCG/ACT inhaler Inhale 1-2 puffs into the lungs every 4 (four)  hours as needed for wheezing or shortness of breath. 03/18/21   Melynda Ripple, MD  ALPRAZolam Duanne Moron) 0.5 MG tablet TAKE ONE-HALF TABLET BY MOUTH EVERY MORNING AND TAKE ONE TABLET BY MOUTH AT BEDTIME AS DIRECTED 05/06/21   Leone Haven, MD  apixaban (ELIQUIS) 5 MG TABS tablet Take 1 tablet (5 mg total) by mouth 2 (two) times daily. 03/24/21   Minna Merritts, MD  atenolol (TENORMIN) 25 MG tablet TAKE ONE TABLET AT BEDTIME 12/06/20   Leone Haven,  MD  atorvastatin (LIPITOR) 80 MG tablet TAKE 1 TABLET BY MOUTH DAILY. 03/11/21   Leone Haven, MD  escitalopram (LEXAPRO) 5 MG tablet TAKE ONE TABLET BY MOUTH EVERY DAY Patient not taking: Reported on 03/24/2021 02/19/20   Leone Haven, MD  fluorouracil (EFUDEX) 5 % cream Apply topically 2 (two) times daily. Starting in January 2023 apply thin amount to forehead, temples and crown scalp bid for 7 days 05/07/21   Ralene Bathe, MD  gabapentin (NEURONTIN) 100 MG capsule Take by mouth. 02/05/20 03/24/21  [provider]  ketoconazole (NIZORAL) 2 % cream Apply to the feet and between the toes QHS. Patient not taking: Reported on 03/24/2021 12/18/19   Ralene Bathe, MD  ketoconazole (NIZORAL) 2 % cream Apply 1 application topically daily. Qhs to feet, in between toes and over toenails 05/07/21   Ralene Bathe, MD  lisinopril (ZESTRIL) 10 MG tablet TAKE ONE TABLET AT BEDTIME 12/06/20   Leone Haven, MD  mupirocin ointment (BACTROBAN) 2 % Apply 1 application topically daily. With dressing changes 10/16/19   Ralene Bathe, MD  nicotine (NICODERM CQ - DOSED IN MG/24 HOURS) 21 mg/24hr patch Place 1 patch (21 mg total) onto the skin daily. For 6 weeks. Call for next step once on the last week. Patient not taking: Reported on 03/24/2021 08/04/19   Leone Haven, MD  nitroGLYCERIN (NITROSTAT) 0.4 MG SL tablet Place 1 tablet (0.4 mg total) under the tongue every 5 (five) minutes as needed for chest pain. For up to 3 doses per episode. 02/21/19   Leone Haven, MD  Spacer/Aero-Holding Chambers (AEROCHAMBER PLUS) inhaler Use with inhaler 03/18/21   Melynda Ripple, MD  tiZANidine (ZANAFLEX) 4 MG tablet Take 1 tablet (4 mg total) by mouth every 6 (six) hours as needed for muscle spasms. Patient not taking: Reported on 03/24/2021 07/17/19   Crecencio Mc, MD  traMADol (ULTRAM) 50 MG tablet Take by mouth. Patient not taking: Reported on 03/24/2021 09/05/19   [provider]  traZODone (DESYREL) 150 MG tablet TAKE ONE TABLET AT BEDTIME 12/06/20   Leone Haven, MD  triamcinolone ointment (KENALOG) 0.1 % Apply to neck twice a day for 2 weeks Patient not taking: Reported on 03/24/2021 07/03/20   Ralene Bathe, MD    Allergies Hydrocodone-acetaminophen and Neurontin [gabapentin]  Family History  Problem Relation Age of Onset   Heart attack Mother    Prostate cancer Father    Breast cancer Sister 76   Dementia Sister    Cancer Brother        unknown what kind    Social History Social History   Tobacco Use   Smoking status: Every Day    Types: Cigars   Smokeless tobacco: Never  Vaping Use   Vaping Use: Never used  Substance Use Topics   Alcohol use: No   Drug use: No    Review of Systems  Review of Systems  Constitutional:  Positive for fever. Negative for chills.  HENT:  Negative for sore throat.   Eyes:  Negative for pain.  Respiratory:  Positive for cough. Negative for stridor.   Cardiovascular:  Negative for chest pain.  Gastrointestinal:  Positive for abdominal pain. Negative for vomiting.  Genitourinary:  Negative for dysuria.  Musculoskeletal:  Positive for back pain.  Skin:  Negative for rash.  Neurological:  Negative for seizures, loss of consciousness and headaches.  Psychiatric/Behavioral:  Negative for suicidal ideas.   All other systems reviewed and are negative.    ____________________________________________   PHYSICAL EXAM:  VITAL SIGNS: ED Triage Vitals  Enc Vitals Group     BP 05/22/21 1450 118/65     Pulse Rate 05/22/21 1450 71     Resp 05/22/21 1450 20     Temp 05/22/21 1450 98.2 F (36.8 C)     Temp src --      SpO2 05/22/21 1450 95 %     Weight 05/22/21 1450 168 lb (76.2 kg)     Height 05/22/21 1450 5\' 8"  (1.727 m)     Head Circumference --      Peak Flow --      Pain Score 05/22/21 1511 6     Pain Loc --      Pain Edu? --      Excl. in [place]? --    Vitals:   05/22/21 1704  05/22/21 1730  BP: (!) 137/104 139/71  Pulse: 72 86  Resp: (!) 26 (!) 23  Temp: 98.6 F (37 C)   SpO2: 96% 99%   Physical Exam Vitals and nursing note reviewed.  Constitutional:      General: He is not in acute distress.    Appearance: He is well-developed.  HENT:     Head: Normocephalic and atraumatic.     Right Ear: External ear normal.     Left Ear: External ear normal.     Nose: Nose normal.  Eyes:     Conjunctiva/sclera: Conjunctivae normal.  Cardiovascular:     Rate and Rhythm: Normal rate and regular rhythm.     Heart sounds: No murmur heard. Pulmonary:     Effort: Tachypnea and respiratory distress present.     Breath sounds: Examination of the right-upper field reveals decreased breath sounds and wheezing. Examination of the left-upper field reveals decreased breath sounds and wheezing. Examination of the right-middle field reveals decreased breath sounds and wheezing. Examination of the left-middle field reveals decreased breath sounds and wheezing. Examination of the right-lower field reveals decreased breath sounds and wheezing. Examination of the left-lower field reveals decreased breath sounds and wheezing. Decreased breath sounds and wheezing present.  Abdominal:     Palpations: Abdomen is soft.     Tenderness: There is no abdominal tenderness.  Musculoskeletal:        General: No swelling.     Cervical back: Neck supple.  Skin:    General: Skin is warm and dry.     Capillary Refill: Capillary refill takes less than 2 seconds.  Neurological:     Mental Status: He is alert and oriented to person, place, and time.  Psychiatric:        Mood and Affect: Mood normal.    Cranial nerves II through XII grossly intact.  No pronator drift.  No finger dysmetria.  Symmetric 5/5 strength of all extremities.  Sensation intact to light touch in all extremities.    ____________________________________________   LABS (all labs ordered  are listed, but only abnormal results  are displayed)  Labs Reviewed  RESP PANEL BY RT-PCR (FLU A&B, COVID) ARPGX2 - Abnormal; Notable for the following components:      Result Value   Influenza A by PCR POSITIVE (*)    All other components within normal limits  BASIC METABOLIC PANEL - Abnormal; Notable for the following components:   Calcium 8.5 (*)    All other components within normal limits  CBC - Abnormal; Notable for the following components:   MCV 101.6 (*)    Platelets 139 (*)    All other components within normal limits  TSH - Abnormal; Notable for the following components:   TSH 0.194 (*)    All other components within normal limits  BLOOD GAS, VENOUS - Abnormal; Notable for the following components:   Bicarbonate 30.2 (*)    Acid-Base Excess 2.2 (*)    All other components within normal limits  TROPONIN I (HIGH SENSITIVITY) - Abnormal; Notable for the following components:   Troponin I (High Sensitivity) 116 (*)    All other components within normal limits  CULTURE, BLOOD (ROUTINE X 2)  CULTURE, BLOOD (ROUTINE X 2)  PROTIME-INR  HEPATIC FUNCTION PANEL  LIPASE, BLOOD  PROCALCITONIN  URINALYSIS, COMPLETE (UACMP) WITH MICROSCOPIC  LACTIC ACID, PLASMA  LACTIC ACID, PLASMA  TROPONIN I (HIGH SENSITIVITY)   ____________________________________________  EKG  ECG remarkable for sinus rhythm with a ventricular rate of 67, normal axis, unremarkable intervals with nonspecific changes and V2 and V3 without other clearance of acute ischemia or significant arrhythmia. ____________________________________________  RADIOLOGY  ED MD interpretation: Chest x-ray is remarkable for some peribronchial thickening consistent with a bronchitis.  There is no overt edema, focal consolidation, effusion, pneumothorax or other clear acute thoracic process.  CT head has no evidence of hemorrhage, ischemia, mass-effect or other acute process aside from some chronic appearing atrophy.  Official radiology report(s): DG Chest 2  View  Result Date: 05/22/2021 CLINICAL DATA:  Cough and fever. EXAM: CHEST - 2 VIEW COMPARISON:  Multiple priors including most recent March 18, 2021 FINDINGS: The heart size and mediastinal contours are within normal limits. Diffuse interstitial prominence is similar in comparison to prior with diffuse bronchial wall thickening. No focal airspace consolidation. No visible pleural effusion or pneumothorax. Thoracic spondylosis. IMPRESSION: Diffuse interstitial prominence with diffuse bronchial wall thickening, without focal airspace consolidation, possibly reflecting bronchitis. Electronically Signed   By: Dahlia Bailiff M.D.   On: 05/22/2021 13:35   CT HEAD WO CONTRAST (5MM)  Result Date: 05/22/2021 CLINICAL DATA:  Mental status change fever EXAM: CT HEAD WITHOUT CONTRAST TECHNIQUE: Contiguous axial images were obtained from the base of the skull through the vertex without intravenous contrast. COMPARISON:  CT brain 04/20/2009 FINDINGS: Brain: No acute territorial infarction, hemorrhage or intracranial mass. Moderate atrophy. Mild chronic small vessel ischemic changes of the white matter. Stable ventricle size. Vascular: No hyperdense vessels.  No unexpected calcification Skull: Normal. Negative for fracture or focal lesion. Sinuses/Orbits: Patchy mucosal thickening in the sinuses Other: None IMPRESSION: 1. No CT evidence for acute intracranial abnormality. 2. Atrophy Electronically Signed   By: Donavan Foil M.D.   On: 05/22/2021 16:26    ____________________________________________   PROCEDURES  Procedure(s) performed (including Critical Care):  .1-3 Lead EKG Interpretation Performed by: Lucrezia Starch, MD Authorized by: Lucrezia Starch, MD     Interpretation: non-specific     ECG rate assessment: normal     Rhythm: sinus rhythm  Ectopy: none     Conduction: normal     ____________________________________________   INITIAL IMPRESSION / ASSESSMENT AND PLAN / ED COURSE       Patient presents with above-stated history exam for assessment of cough, fevers, some upper abdominal comfort with coughing and confusion in the mornings the last 2 days.  Patient is afebrile and hemodynamically stable.  He was initially seen in urgent care referred to the ED for further evaluation.  He states he is compliant with his Eliquis and on exam emergency room is alert and oriented x3 with nonfocal neurological exam.  At this time I have very low suspicion for CVA and he does not appear septic or meningitic.  CT head has no evidence of hemorrhage, ischemia, mass-effect or other acute process aside from some chronic appearing atrophy.  Primary differential considerations include some encephalopathy possibly from some hypercarbia buildup overnight from OSA in the setting of COPD exacerbation, bronchitis, pneumonia, ACS, arrhythmia, anemia, metabolic derangements.  No significant tenderness to the abdomen on exam and given reassuring lipase and hepatic function panel suspect gastroenteritis related to soreness in the rectus muscles.  ECG remarkable for sinus rhythm with a ventricular rate of 67, normal axis, unremarkable intervals with nonspecific changes and V2 and V3 without other clearance of acute ischemia or significant arrhythmia.  Initial troponin is 116 concerning for an NSTEMI.  Patient is denying any active chest pain at this time and given he is on Eliquis will defer heparin we will give a dose of ASA.  We will also plan to trend this.  I suspect this represents demand in the setting of COPD exacerbation and acute infectious process.  Chest x-ray is remarkable for some peribronchial thickening consistent with a bronchitis.  There is no overt edema, focal consolidation, effusion, pneumothorax or other clear acute thoracic process.  INR is unremarkable.  CBC without leukocytosis or acute anemia.  BMP without significant electrolyte or metabolic derangements.  VBG remarkable for some  mild hypercarbic respiratory failure with a pH of 7.31 with a PCO2 of 60 and a bicarb of 30.3.  On reassessment patient states he is feeling much better after initial duo nebs and steroids still has some tachypnea and inspiratory and expiratory bilateral wheezing.  Will order repeat DuoNeb admit to medicine service for further evaluation and management.  At this time I suspect likely a viral cause of patient's exacerbation given absence of leukocytosis and undetectable procalcitonin although will obtain a blood culture.  COVID and influenza PCR sent as well.  Influenza PCR is positive.  COVID is negative.  I will start patient on Tamiflu.    I will admit to medicine service for further evaluation and management.  ____________________________________________   FINAL CLINICAL IMPRESSION(S) / ED DIAGNOSES  Final diagnoses:  COPD exacerbation ([place])  NSTEMI (non-ST elevated myocardial infarction) ([place])  Influenza A    Medications  oseltamivir (TAMIFLU) capsule 30 mg (has no administration in time range)  ipratropium-albuterol (DUONEB) 0.5-2.5 (3) MG/3ML nebulizer solution 9 mL (9 mLs Nebulization Given 05/22/21 1653)  methylPREDNISolone sodium succinate (SOLU-MEDROL) 125 mg/2 mL injection 125 mg (125 mg Intravenous Given 05/22/21 1656)  aspirin chewable tablet 324 mg (324 mg Oral Given 05/22/21 1653)  benzonatate (TESSALON) capsule 100 mg (100 mg Oral Given 05/22/21 1653)  acetaminophen (TYLENOL) 500 MG tablet (  Given 05/22/21 1757)  ipratropium-albuterol (DUONEB) 0.5-2.5 (3) MG/3ML nebulizer solution 9 mL (9 mLs Nebulization Given 05/22/21 1756)     ED Discharge Orders  None        Note:  This document was prepared using Dragon voice recognition software and may include unintentional dictation errors.    Lucrezia Starch, MD 05/22/21 1808    Lucrezia Starch, MD 05/22/21 Vernelle Emerald

## 2021-05-22 NOTE — Discharge Instructions (Signed)
Go immediately to [facility] hospital for further work-up and management of patient's symptoms.  His blood pressure is very low, he has had intermittent bouts of confusion and his abdomen is very distended and his chest x-ray is abnormal and shows increased inflammation compared to prior chest x-rays.  He needs further work-up to determine the source of his illness in the setting of the emergency department.

## 2021-05-22 NOTE — ED Triage Notes (Signed)
Patient sent by [facility] care for hypotension, intermittent confusion, and distended abdomen. Denies pain in abdomen. C/o lower chronic back pain that flared up after doing yardwork a few days ago. Reports epigastric pain when coughing. UC reports his chest xray showed increased inflammation compared to prior chest xrays.

## 2021-05-22 NOTE — ED Provider Notes (Signed)
Karl Myers    CSN: [id number] Arrival date & time: 05/22/21  1223      History   Chief Complaint Chief Complaint  Patient presents with   Cough    HPI Karl Myers is a 75 y.o. male.   HPI Patient with a known history of COPD, current active smoker, prediabetes, and hypertension presents today with 2 days of fever greater than 101, worsening shortness of breath, wheezing, intermittent confusion per wife, and abdominal distention, back pain and leg pain.  Symptoms have been present and progressively worsening over the last 2 days.  Wife states that he had not had any known contact with anyone who has been sick with COVID or flu.  Wife reports that husband has been deteriorating over the last 2 days and has been confusing simple tasks as putting his clothes on mistaking a jar of jelly for a cup of juice.  On arrival patient is afebrile however on multiple checks patient's blood pressure is low 97/57.  He takes 1 dose of atenolol at nighttime only.  He has a history also of atrial fibrillation.  He is anticoagulated with Eliquis.  He reports that low chest wall pain with coughing only.  He reports generalized back pain which is worsened over the last 2 days. He last had fever earlier this morning greater than 100 and wife treated with Tylenol prior to coming to urgent care. 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   Pre-diabetes     Patient Active Problem List   Diagnosis Date Noted   History of melanoma 10/13/2019   Atrial fibrillation ([place]) 08/01/2019   Low TSH level 08/01/2019   COPD (chronic obstructive pulmonary disease) ([place])    Anxiety    Lump of  left breast 05/22/2019   Skin lesion 05/22/2019   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   Family history of prostate cancer in father 06/17/2018   CAD (coronary artery disease) 07/27/2017   Tobacco abuse 06/24/2017   Hypertension 03/12/2017   Hyperlipidemia 03/12/2017   Fatigue 03/12/2017   Sleeping difficulty 03/12/2017   Cervical radiculopathy 03/12/2017    Past Surgical History:  Procedure Laterality Date   BACK SURGERY     x 3   CARPAL TUNNEL RELEASE Right 08/19/2017   Procedure: CARPAL TUNNEL RELEASE;  Surgeon: Hessie Knows, MD;  Location: [facility] ORS;  Service: Orthopedics;  Laterality: Right;   CATARACT EXTRACTION W/PHACO Right 11/17/2016   Procedure: CATARACT EXTRACTION PHACO AND INTRAOCULAR LENS PLACEMENT (IOC);  Surgeon: Birder Robson, MD;  Location: [facility];  Service: Ophthalmology;  Laterality: Right;  [place] 00:37 AP% 12.6 CDE 4.69 Fluid pack lot # [id number]   CATARACT EXTRACTION W/PHACO Left 12/08/2016   Procedure: CATARACT EXTRACTION PHACO AND INTRAOCULAR LENS PLACEMENT (IOC);  Surgeon: Birder Robson, MD;  Location: [facility];  Service: Ophthalmology;  Laterality: Left;  [place] 00:33 AP% 13.9 CDE 4.63 Fluid pack lot # [id number]   ULNAR TUNNEL RELEASE Right 08/19/2017   Procedure: CUBITAL TUNNEL RELEASE;  Surgeon: Hessie Knows, MD;  Location: [facility] OR[facility];  Service: Orthopedics;  Laterality: Right;       Home  Medications    Prior to Admission medications   Medication Sig Start Date End Date Taking? Authorizing Provider  albuterol (VENTOLIN HFA) 108 (90 Base) MCG/ACT inhaler Inhale 1-2 puffs into the lungs every 4 (four) hours as needed for wheezing or shortness of breath. 03/18/21   Melynda Ripple, MD  ALPRAZolam Duanne Moron) 0.5 MG tablet TAKE ONE-HALF TABLET BY MOUTH EVERY MORNING AND TAKE ONE TABLET BY MOUTH AT BEDTIME AS DIRECTED 05/06/21   Leone Haven, MD  apixaban (ELIQUIS) 5 MG TABS tablet Take 1 tablet (5 mg total) by mouth 2 (two) times daily.  03/24/21   Minna Merritts, MD  atenolol (TENORMIN) 25 MG tablet TAKE ONE TABLET AT BEDTIME 12/06/20   Leone Haven, MD  atorvastatin (LIPITOR) 80 MG tablet TAKE 1 TABLET BY MOUTH DAILY. 03/11/21   Leone Haven, MD  escitalopram (LEXAPRO) 5 MG tablet TAKE ONE TABLET BY MOUTH EVERY DAY Patient not taking: Reported on 03/24/2021 02/19/20   Leone Haven, MD  fluorouracil (EFUDEX) 5 % cream Apply topically 2 (two) times daily. Starting in January 2023 apply thin amount to forehead, temples and crown scalp bid for 7 days 05/07/21   Ralene Bathe, MD  gabapentin (NEURONTIN) 100 MG capsule Take by mouth. 02/05/20 03/24/21  [provider]  ketoconazole (NIZORAL) 2 % cream Apply to the feet and between the toes QHS. Patient not taking: Reported on 03/24/2021 12/18/19   Ralene Bathe, MD  ketoconazole (NIZORAL) 2 % cream Apply 1 application topically daily. Qhs to feet, in between toes and over toenails 05/07/21   Ralene Bathe, MD  lisinopril (ZESTRIL) 10 MG tablet TAKE ONE TABLET AT BEDTIME 12/06/20   Leone Haven, MD  mupirocin ointment (BACTROBAN) 2 % Apply 1 application topically daily. With dressing changes 10/16/19   Ralene Bathe, MD  nicotine (NICODERM CQ - DOSED IN MG/24 HOURS) 21 mg/24hr patch Place 1 patch (21 mg total) onto the skin daily. For 6 weeks. Call for next step once on the last week. Patient not taking: Reported on 03/24/2021 08/04/19   Leone Haven, MD  nitroGLYCERIN (NITROSTAT) 0.4 MG SL tablet Place 1 tablet (0.4 mg total) under the tongue every 5 (five) minutes as needed for chest pain. For up to 3 doses per episode. 02/21/19   Leone Haven, MD  Spacer/Aero-Holding Chambers (AEROCHAMBER PLUS) inhaler Use with inhaler 03/18/21   Melynda Ripple, MD  tiZANidine (ZANAFLEX) 4 MG tablet Take 1 tablet (4 mg total) by mouth every 6 (six) hours as needed for muscle spasms. Patient not taking: Reported on 03/24/2021 07/17/19   Crecencio Mc, MD  traMADol (ULTRAM) 50 MG tablet Take by mouth. Patient not taking: Reported on 03/24/2021 09/05/19   [provider]  traZODone (DESYREL) 150 MG tablet TAKE ONE TABLET AT BEDTIME 12/06/20   Leone Haven, MD  triamcinolone ointment (KENALOG) 0.1 % Apply to neck twice a day for 2 weeks Patient not taking: Reported on 03/24/2021 07/03/20   Ralene Bathe, MD    Family History Family History  Problem Relation Age of Onset   Heart attack Mother    Prostate cancer Father    Breast cancer Sister 89   Dementia Sister    Cancer Brother        unknown what kind    Social History Social History   Tobacco Use   Smoking status: Every Day    Packs/day: 1.00    Types: Cigarettes  Smokeless tobacco: Never  Vaping Use   Vaping Use: Never used  Substance Use Topics   Alcohol use: No   Drug use: No     Allergies   Hydrocodone-acetaminophen and Neurontin [gabapentin]   Review of Systems Review of Systems Pertinent negatives listed in HPI   Physical Exam Triage Vital Signs ED Triage Vitals  Enc Vitals Group     BP 05/22/21 1256 (!) 97/57     Pulse Rate 05/22/21 1256 70     Resp 05/22/21 1256 17     Temp 05/22/21 1256 98.7 F (37.1 C)     Temp Source 05/22/21 1256 Oral     SpO2 05/22/21 1256 95 %     Weight --      Height --      Head Circumference --      Peak Flow --      Pain Score 05/22/21 1255 6     Pain Loc --      Pain Edu? --      Excl. in [place]? --    No data found.  Updated Vital Signs BP (!) 97/57 (BP Location: Left Arm)    Pulse 70    Temp 98.7 F (37.1 C) (Oral)    Resp 17    SpO2 95%   Visual Acuity Right Eye Distance:   Left Eye Distance:   Bilateral Distance:    Right Eye Near:   Left Eye Near:    Bilateral Near:     Physical Exam Constitutional:      Appearance: He is ill-appearing and toxic-appearing.  HENT:     Head: Normocephalic.     Right Ear: Tympanic membrane normal.     Left Ear: Tympanic membrane normal.      Mouth/Throat:     Mouth: Mucous membranes are moist.     Pharynx: No oropharyngeal exudate or posterior oropharyngeal erythema.  Eyes:     Comments: Bilateral eyes appear droopy with bilateral sclera redness  Cardiovascular:     Rate and Rhythm: Normal rate and regular rhythm.  Pulmonary:     Breath sounds: Wheezing and rales present.  Chest:     Chest wall: Tenderness present.  Abdominal:     General: Bowel sounds are decreased. There is distension.     Palpations: Abdomen is rigid.     Tenderness: There is no abdominal tenderness. There is right CVA tenderness and left CVA tenderness.  Musculoskeletal:     Cervical back: Normal range of motion.     Right lower leg: Edema present.     Left lower leg: Edema present.  Lymphadenopathy:     Cervical: No cervical adenopathy.  Skin:    Capillary Refill: Capillary refill takes 2 to 3 seconds.     Coloration: Skin is pale.  Neurological:     Mental Status: He is oriented to person, place, and time.  Psychiatric:        Mood and Affect: Mood normal.        Thought Content: Thought content normal.     UC Treatments / Results  Labs (all labs ordered are listed, but only abnormal results are displayed) Labs Reviewed - No data to display  EKG   Radiology given his symptoms DG Chest 2 View  Result Date: 05/22/2021 CLINICAL DATA:  Cough and fever. EXAM: CHEST - 2 VIEW COMPARISON:  Multiple priors including most recent March 18, 2021 FINDINGS: The heart size and mediastinal contours are within normal limits. Diffuse interstitial  prominence is similar in comparison to prior with diffuse bronchial wall thickening. No focal airspace consolidation. No visible pleural effusion or pneumothorax. Thoracic spondylosis. IMPRESSION: Diffuse interstitial prominence with diffuse bronchial wall thickening, without focal airspace consolidation, possibly reflecting bronchitis. Electronically Signed   By: Dahlia Bailiff M.D.   On: 05/22/2021 13:35     Procedures Procedures (including critical care time)  Medications Ordered in UC Medications - No data to display  Initial Impression / Assessment and Plan / UC Course  I have reviewed the triage vital signs and the nursing notes.  Pertinent labs & imaging results that were available during my care of the patient were reviewed by me and considered in my medical decision making (see chart for details).  Patient is able to identify month, day of the week, season, and his current location.  However he is very lethargic and requires prompting to answering questions multiple times.  He appears very ill and severely fatigued.  He has diffuse wheezing and rales on exam.  His blood pressure remains low.  Chest x-ray shows diffuse interstitial prominence which is worsened compared to previous chest x-ray collected on 03/29/2021.  Of significance patient also has diffuse abdominal distention which is a new finding that is occurred over the last 2 days.  In addition to the reported fevers patient warrants further work-up and evaluation of the setting in the ER as he is at increased risk for sepsis.  Suspect patient either has a likely underlying viral illness which is precipitating fever and causing illness or some other unknown underlying etiology that is precipitating symptoms.  Patient is stable to be transported via car to the emergency department.  Wife encouraged to take him directly to the emergency department given his symptoms have progressively worsened over the last 48 hours. Final Clinical Impressions(s) / UC Diagnoses   Final diagnoses:  Hypotension, unspecified hypotension type  Fever, unspecified  Shortness of breath  Episodic confusion  Cough, unspecified type  Abnormal chest x-ray     Discharge Instructions      Go immediately to [facility] for further work-up and management of patient's symptoms.  His blood pressure is very low, he has had intermittent bouts of  confusion and his abdomen is very distended and his chest x-ray is abnormal and shows increased inflammation compared to prior chest x-rays.  He needs further work-up to determine the source of his illness in the setting of the emergency department.     ED Prescriptions   None    PDMP not reviewed this encounter.   Scot Jun, FNP 05/22/21 1351

## 2021-05-22 NOTE — Progress Notes (Signed)
PHARMACY NOTE:  ANTIMICROBIAL RENAL DOSAGE ADJUSTMENT  Current antimicrobial regimen includes a mismatch between antimicrobial dosage and estimated renal function.  As per policy approved by the Pharmacy & Therapeutics and Medical Executive Committees, the antimicrobial dosage will be adjusted accordingly.  Current antimicrobial dosage:  Tamiflu 30 mg x 1  Indication: Influenza treatment  Renal Function:  Estimated Creatinine Clearance: 58.3 mL/min (by C-G formula based on SCr of 1.06 mg/dL).    Antimicrobial dosage has been changed to:  Tamiflu 75 mg x 1  Additional comments: Tamiflu 75 mg for first dose then 30 mg BID thereafter to complete course. Could argue for 75 mg dose as well as Scr relatively normal, normalized CrCl 61 mL/min, GFR > 60   Thank you for allowing pharmacy to be a part of this patient's care.  Benita Gutter, [facility] [place] 05/22/2021 6:22 PM

## 2021-05-22 NOTE — H&P (Signed)
History and Physical    Karl Myers XIH:[id number] DOB: July 07, 1945 DOA: 05/22/2021  PCP: Leone Haven, MD   Patient coming from:  Home  I have personally briefly reviewed patient's old medical records in [place]  Chief Complaint: Flulike symptoms for 2 days.  HPI: Karl Myers is a 75 y.o. male with PMH significant for COPD, depression, hypertension, tobacco abuse, atrial fibrillation on Eliquis presented in the ED with flulike symptoms for 2 days.  Patient report he has never used oxygen, frequently gets COPD attacks because of weather changes.  He has developed cough runny nose, sore throat headache associated with fever and chills.  T-max noted was 100.7 yesterday patient has been taking over-the-counter medication.  He was found delirious in the morning, family reports he was talking sentences which does not make any sense.  Family brought him in the ED for further evaluation.  ED Course: He was severely short of breath requiring 5 L of supplemental oxygen on arrival which weaned down to room air after breathing treatments. HR 71, RR 20, temp 98.2, BP 118/65, SPO2 95% on room air after breathing treatment. Labs include sodium 137, potassium 4.2, chloride 102, bicarb 28, glucose 81, BUN 21, creatinine 1.06, calcium 8.5, alkaline phosphatase 55, albumin 4.3, lipase 43, AST 30, ALT 17, total protein 6.9, total bilirubin 0.6, troponin 116> 103, lactic acid 1.4, procalcitonin 0.10, WBC 8.5, hemoglobin 14.3, hematocrit 43.2, platelets 139, TSH 0.194, influenza positive, COVID-negative, CT head no acute abnormality noted. Chest x-ray:Diffuse interstitial prominence with diffuse bronchial wall thickening, without focal airspace consolidation, possibly reflecting bronchitis.  Review of Systems:  Review of Systems  Constitutional:  Positive for chills, fever and malaise/fatigue.  HENT:  Positive for congestion, sinus pain and sore throat.   Eyes: Negative.   Respiratory:   Positive for cough and shortness of breath.   Cardiovascular: Negative.   Gastrointestinal: Negative.   Genitourinary: Negative.   Musculoskeletal:  Positive for joint pain and myalgias.  Neurological: Negative.   Psychiatric/Behavioral: Negative.      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   Pre-diabetes     Past Surgical History:  Procedure Laterality Date   BACK SURGERY     x 3   CARPAL TUNNEL RELEASE Right 08/19/2017   Procedure: CARPAL TUNNEL RELEASE;  Surgeon: Hessie Knows, MD;  Location: [facility] ORS;  Service: Orthopedics;  Laterality: Right;   CATARACT EXTRACTION W/PHACO Right 11/17/2016   Procedure: CATARACT EXTRACTION PHACO AND INTRAOCULAR LENS PLACEMENT (IOC);  Surgeon: Birder Robson, MD;  Location: [facility] ORS;  Service: Ophthalmology;  Laterality: Right;  [place] 00:37 AP% 12.6 CDE 4.69 Fluid pack lot # [id number]   CATARACT EXTRACTION W/PHACO Left 12/08/2016   Procedure: CATARACT EXTRACTION PHACO AND INTRAOCULAR LENS PLACEMENT (IOC);  Surgeon: Birder Robson, MD;  Location: [facility] ORS;  Service: Ophthalmology;  Laterality: Left;  [place] 00:33 AP% 13.9 CDE 4.63 Fluid pack lot # [id number]   ULNAR TUNNEL RELEASE Right 08/19/2017   Procedure: CUBITAL TUNNEL RELEASE;  Surgeon: Hessie Knows, MD;  Location: [facility] ORS;  Service: Orthopedics;  Laterality: Right;     reports that he  has been smoking cigars. He has never used smokeless tobacco. He reports that he does not drink alcohol and does not use drugs.  Allergies  Allergen Reactions   Hydrocodone-Acetaminophen Other (See Comments)    With large quantities "hyper"   Neurontin [Gabapentin] Other (See Comments)    With  large quantities "hyper"     Family History  Problem Relation Age of Onset   Heart attack Mother    Prostate cancer Father    Breast cancer Sister 62   Dementia Sister    Cancer Brother        unknown what kind    Family history reviewed and not pertinent.  Prior to Admission medications   Medication Sig Start Date End Date Taking? Authorizing Provider  albuterol (VENTOLIN HFA) 108 (90 Base) MCG/ACT inhaler Inhale 1-2 puffs into the lungs every 4 (four) hours as needed for wheezing or shortness of breath. 03/18/21   Melynda Ripple, MD  ALPRAZolam Duanne Moron) 0.5 MG tablet TAKE ONE-HALF TABLET BY MOUTH EVERY MORNING AND TAKE ONE TABLET BY MOUTH AT BEDTIME AS DIRECTED 05/06/21   Leone Haven, MD  apixaban (ELIQUIS) 5 MG TABS tablet Take 1 tablet (5 mg total) by mouth 2 (two) times daily. 03/24/21   Minna Merritts, MD  atenolol (TENORMIN) 25 MG tablet TAKE ONE TABLET AT BEDTIME 12/06/20   Leone Haven, MD  atorvastatin (LIPITOR) 80 MG tablet TAKE 1 TABLET BY MOUTH DAILY. 03/11/21   Leone Haven, MD  escitalopram (LEXAPRO) 5 MG tablet TAKE ONE TABLET BY MOUTH EVERY DAY Patient not taking: Reported on 03/24/2021 02/19/20   Leone Haven, MD  fluorouracil (EFUDEX) 5 % cream Apply topically 2 (two) times daily. Starting in January 2023 apply thin amount to forehead, temples and crown scalp bid for 7 days 05/07/21   Ralene Bathe, MD  gabapentin (NEURONTIN) 100 MG capsule Take by mouth. 02/05/20 03/24/21  [provider]  ketoconazole (NIZORAL) 2 % cream Apply to the feet and between the toes QHS. Patient not taking: Reported on 03/24/2021 12/18/19   Ralene Bathe, MD  ketoconazole (NIZORAL) 2 % cream Apply 1 application topically daily. Qhs to feet, in between toes and over toenails 05/07/21   Ralene Bathe, MD  lisinopril (ZESTRIL) 10 MG tablet TAKE ONE TABLET AT BEDTIME 12/06/20   Leone Haven, MD  mupirocin ointment (BACTROBAN) 2 % Apply 1  application topically daily. With dressing changes 10/16/19   Ralene Bathe, MD  nicotine (NICODERM CQ - DOSED IN MG/24 HOURS) 21 mg/24hr patch Place 1 patch (21 mg total) onto the skin daily. For 6 weeks. Call for next step once on the last week. Patient not taking: Reported on 03/24/2021 08/04/19   Leone Haven, MD  nitroGLYCERIN (NITROSTAT) 0.4 MG SL tablet Place 1 tablet (0.4 mg total) under the tongue every 5 (five) minutes as needed for chest pain. For up to 3 doses per episode. 02/21/19   Leone Haven, MD  Spacer/Aero-Holding Chambers (AEROCHAMBER PLUS) inhaler Use with inhaler 03/18/21   Melynda Ripple, MD  tiZANidine (ZANAFLEX) 4 MG tablet Take 1 tablet (4 mg total) by mouth every 6 (six) hours as needed for muscle spasms. Patient not taking: Reported on 03/24/2021 07/17/19   Crecencio Mc, MD  traMADol (ULTRAM) 50 MG tablet Take by mouth. Patient not taking: Reported on 03/24/2021 09/05/19   [provider]  traZODone (DESYREL) 150 MG tablet TAKE ONE TABLET AT BEDTIME 12/06/20  Leone Haven, MD  triamcinolone ointment (KENALOG) 0.1 % Apply to neck twice a day for 2 weeks Patient not taking: Reported on 03/24/2021 07/03/20   Ralene Bathe, MD    Physical Exam: Vitals:   05/22/21 1700 05/22/21 1704 05/22/21 1730 05/22/21 1800  BP: (!) 137/104 (!) 137/104 139/71 127/67  Pulse:  72 86 93  Resp: (!) 27 (!) 26 (!) 23 19  Temp:  98.6 F (37 C)    TempSrc:  Oral    SpO2:  96% 99% 100%  Weight:      Height:        Constitutional: Appears comfortable, not in any acute distress.  Deconditioned Vitals:   05/22/21 1700 05/22/21 1704 05/22/21 1730 05/22/21 1800  BP: (!) 137/104 (!) 137/104 139/71 127/67  Pulse:  72 86 93  Resp: (!) 27 (!) 26 (!) 23 19  Temp:  98.6 F (37 C)    TempSrc:  Oral    SpO2:  96% 99% 100%  Weight:      Height:       Eyes: PERRL, lids and conjunctivae normal ENMT: Mucous membranes are moist.  Posterior pharynx without  exudate..Normal dentition.  Neck: normal, supple, no masses, no thyromegaly Respiratory: Clear to auscultation bilaterally, no wheezing, no crackles.  No accessory muscle use.  Cardiovascular: S1-S2 heard, regular rate and rhythm, no murmur. No carotid bruits.  Abdomen: Abdomen is soft, nontender, nondistended, BS + Musculoskeletal: No edema, no cyanosis, no clubbing. Good ROM, no contractures. Normal muscle tone.  Skin: no rashes, lesions, ulcers. No induration Neurologic: CN 2-12 grossly intact. Sensation intact, DTR normal.  Psychiatric: Normal judgment and insight. Alert and oriented x 3. Normal mood.    Labs on Admission: I have personally reviewed following labs and imaging studies  CBC: Recent Labs  Lab 05/22/21 1451  WBC 8.5  HGB 14.3  HCT 43.2  MCV 101.6*  PLT 528*   Basic Metabolic Panel: Recent Labs  Lab 05/22/21 1451  NA 137  K 4.2  CL 102  CO2 28  GLUCOSE 81  BUN 21  CREATININE 1.06  CALCIUM 8.5*   GFR: Estimated Creatinine Clearance: 58.3 mL/min (by C-G formula based on SCr of 1.06 mg/dL). Liver Function Tests: Recent Labs  Lab 05/22/21 1451  AST 30  ALT 17  ALKPHOS 55  BILITOT 0.5  PROT 6.9  ALBUMIN 4.3   Recent Labs  Lab 05/22/21 1451  LIPASE 43   No results for input(s): AMMONIA in the last 168 hours. Coagulation Profile: Recent Labs  Lab 05/22/21 1451  INR 1.1   Cardiac Enzymes: No results for input(s): CKTOTAL, CKMB, CKMBINDEX, TROPONINI in the last 168 hours. BNP (last 3 results) No results for input(s): PROBNP in the last 8760 hours. HbA1C: No results for input(s): HGBA1C in the last 72 hours. CBG: No results for input(s): GLUCAP in the last 168 hours. Lipid Profile: No results for input(s): CHOL, HDL, LDLCALC, TRIG, CHOLHDL, LDLDIRECT in the last 72 hours. Thyroid Function Tests: Recent Labs    05/22/21 1451  TSH 0.194*   Anemia Panel: No results for input(s): VITAMINB12, FOLATE, FERRITIN, TIBC, IRON, RETICCTPCT in  the last 72 hours. Urine analysis:    Component Value Date/Time   COLORURINE YELLOW (A) 07/24/2019 1508   APPEARANCEUR CLEAR (A) 07/24/2019 1508   LABSPEC 1.006 07/24/2019 1508   PHURINE 6.0 07/24/2019 1508   GLUCOSEU NEGATIVE 07/24/2019 1508   HGBUR NEGATIVE 07/24/2019 1508   BILIRUBINUR NEGATIVE 07/24/2019 1508  KETONESUR NEGATIVE 07/24/2019 1508   PROTEINUR NEGATIVE 07/24/2019 1508   NITRITE NEGATIVE 07/24/2019 1508   LEUKOCYTESUR NEGATIVE 07/24/2019 1508    Radiological Exams on Admission: DG Chest 2 View  Result Date: 05/22/2021 CLINICAL DATA:  Cough and fever. EXAM: CHEST - 2 VIEW COMPARISON:  Multiple priors including most recent March 18, 2021 FINDINGS: The heart size and mediastinal contours are within normal limits. Diffuse interstitial prominence is similar in comparison to prior with diffuse bronchial wall thickening. No focal airspace consolidation. No visible pleural effusion or pneumothorax. Thoracic spondylosis. IMPRESSION: Diffuse interstitial prominence with diffuse bronchial wall thickening, without focal airspace consolidation, possibly reflecting bronchitis. Electronically Signed   By: Dahlia Bailiff M.D.   On: 05/22/2021 13:35   CT HEAD WO CONTRAST (5MM)  Result Date: 05/22/2021 CLINICAL DATA:  Mental status change fever EXAM: CT HEAD WITHOUT CONTRAST TECHNIQUE: Contiguous axial images were obtained from the base of the skull through the vertex without intravenous contrast. COMPARISON:  CT brain 04/20/2009 FINDINGS: Brain: No acute territorial infarction, hemorrhage or intracranial mass. Moderate atrophy. Mild chronic small vessel ischemic changes of the white matter. Stable ventricle size. Vascular: No hyperdense vessels.  No unexpected calcification Skull: Normal. Negative for fracture or focal lesion. Sinuses/Orbits: Patchy mucosal thickening in the sinuses Other: None IMPRESSION: 1. No CT evidence for acute intracranial abnormality. 2. Atrophy Electronically  Signed   By: Donavan Foil M.D.   On: 05/22/2021 16:26    EKG: Independently reviewed.  Normal sinus rhythm.  Assessment/Plan Principal Problem:   COPD with acute exacerbation (HCC) Active Problems:   Hypertension   Hyperlipidemia   Cervical radiculopathy   Tobacco abuse   CAD (coronary artery disease)   Atrial fibrillation (HCC)  Acute hypoxic respiratory failure secondary to COPD exacerbation: Patient presented with flulike symptoms, he was hypoxic on arrival requiring 5 L of supplemental oxygen. After breathing treatments and Solu-Medrol he is weaned down to room air. Continue to monitor for oxygen requirement.  COPD exacerbation: Patient has COPD flare in the setting of influenza. Continue Solu-Medrol 60 mg every 12hr, Continue scheduled and as needed DuoNeb nebulization. Chest x-ray shows finding consistent with bronchitis Continue azithromycin for 5 days.  Influenza: Continue Tamiflu twice daily Continue supportive care.  Elevated troponin: This could be due to demand ischemia.   Patient denies chest Troponin trending 116>103  Atrial fibrillation: Continue atenolol,  continue Eliquis.   Heart rate controlled  Essential hypertension: Continue lisinopril, atenolol.  Cervical radiculopathy Continue tramadol.  Tobacco abuse: Counseled on smoking cessation.  DVT prophylaxis: Eliquis Code Status: DNR Family Communication: No family at bedside Disposition Plan:    Status is: Inpatient  Remains inpatient appropriate because: Admitted for COPD exacerbation requiring IV Solu-Medrol.  Also found to have elevated troponin due to demand ischemia.  Consults called: None Admission status: Inpatient   Shawna Clamp MD [facility]   If 7PM-7AM, please contact night-coverage   05/22/2021, 7:43 PM

## 2021-05-22 NOTE — ED Triage Notes (Signed)
Pt c/o cough, chest congestion, and back pain x 3 days

## 2021-05-23 DIAGNOSIS — J441 Chronic obstructive pulmonary disease with (acute) exacerbation: Secondary | ICD-10-CM | POA: Diagnosis not present

## 2021-05-23 LAB — COMPREHENSIVE METABOLIC PANEL
ALT: 18 U/L (ref 0–44)
AST: 32 U/L (ref 15–41)
Albumin: 3.7 g/dL (ref 3.5–5.0)
Alkaline Phosphatase: 51 U/L (ref 38–126)
Anion gap: 7 (ref 5–15)
BUN: 21 mg/dL (ref 8–23)
CO2: 27 mmol/L (ref 22–32)
Calcium: 8.6 mg/dL — ABNORMAL LOW (ref 8.9–10.3)
Chloride: 104 mmol/L (ref 98–111)
Creatinine, Ser: 0.97 mg/dL (ref 0.61–1.24)
GFR, Estimated: >60 mL/min (ref >60)
Glucose, Bld: 117 mg/dL — ABNORMAL HIGH (ref 70–99)
Potassium: 4.6 mmol/L (ref 3.5–5.1)
Sodium: 138 mmol/L (ref 135–145)
Total Bilirubin: 0.6 mg/dL (ref 0.3–1.2)
Total Protein: 6.3 g/dL — ABNORMAL LOW (ref 6.5–8.1)

## 2021-05-23 LAB — HIV ANTIBODY (ROUTINE TESTING W REFLEX): HIV Screen 4th Generation wRfx: NONREACTIVE

## 2021-05-23 LAB — CBC
HCT: 41.5 % (ref 39.0–52.0)
Hemoglobin: 14.1 g/dL (ref 13.0–17.0)
MCH: 33.8 pg (ref 26.0–34.0)
MCHC: 34 g/dL (ref 30.0–36.0)
MCV: 99.5 fL (ref 80.0–100.0)
Platelets: 138 10*3/uL — ABNORMAL LOW (ref 150–400)
RBC: 4.17 MIL/uL — ABNORMAL LOW (ref 4.22–5.81)
RDW: 14.2 % (ref 11.5–15.5)
WBC: 10.3 10*3/uL (ref 4.0–10.5)
nRBC: 0 % (ref 0.0–0.2)

## 2021-05-23 LAB — LACTIC ACID, PLASMA: Lactic Acid, Venous: 1.9 mmol/L (ref 0.5–1.9)

## 2021-05-23 MED ORDER — PREDNISONE 10 MG (21) PO TBPK: ORAL_TABLET | ORAL | 0 refills | Status: DC

## 2021-05-23 MED ORDER — OSELTAMIVIR PHOSPHATE 75 MG PO CAPS: 75.0000 mg | ORAL_CAPSULE | Freq: Two times a day (BID) | ORAL | 0 refills | Status: AC

## 2021-05-23 MED ORDER — GUAIFENESIN 100 MG/5ML PO LIQD: 5.0000 mL | ORAL | 0 refills | Status: DC | PRN

## 2021-05-23 NOTE — Progress Notes (Signed)
PT Cancellation Note  Patient Details Name: Karl Myers MRN: [id number] DOB: December 26, 1945   Cancelled Treatment:    Reason Eval/Treat Not Completed: PT screened, no needs identified, will sign off (Consult received,chart reviewed. Per discussion with attending physician, patient medically improved & planning for discharge at this time. No acute PT/OT needs identified; mobilizing without difficulty. Will complete order; reconsult should needs change)   Abrar Koone H. Owens Shark, PT, DPT, NCS 05/23/21, 9:35 AM [phone]

## 2021-05-23 NOTE — Evaluation (Signed)
Occupational Therapy Evaluation Patient Details Name: Karl Myers MRN: [id number] DOB: 1946-05-15 Today's Date: 05/23/2021   History of Present Illness Pt is a 75 y/o M with PMH: COPD, depression, hypertension, tobacco abuse, atrial fibrillation on Eliquis presented in the ED with flulike symptoms for 2 days. He was SOB with low says requiring 5Lnc initially, but is now weaned to RA at time of OT evaluation.   Clinical Impression   Pt seen for OT evaluation this date in setting of acute hospitalization d/t influenza. He presents this date reporting that he feels nearly back to normal. He is noted to have no acute gross balance deficits, only some mild sway on initial CTS, but is noted to improve with fxl mobility and subsequent transfers. He requires increased time to don shoes, but is overall able to don them without assistance. O2 assessed throughout session and pt able to sustain sats >91% on RA. He completes ~45-50' to simulate HH distance. Pt returned to stretcher at end of session and RN updated on session. No further OT needs perceived.      Recommendations for follow up therapy are one component of a multi-disciplinary discharge planning process, led by the attending physician.  Recommendations may be updated based on patient status, additional functional criteria and insurance authorization.   Follow Up Recommendations  No OT follow up    Assistance Recommended at Discharge None  Functional Status Assessment  Patient has not had a recent decline in their functional status  Equipment Recommendations       Recommendations for Other Services       Precautions / Restrictions Precautions Precautions: Fall Restrictions Weight Bearing Restrictions: No      Mobility Bed Mobility Overal bed mobility: Modified Independent             General bed mobility comments: increased time    Transfers Overall transfer level: Independent                         Balance Overall balance assessment: Mild deficits observed, not formally tested                                         ADL either performed or assessed with clinical judgement   ADL Overall ADL's : Modified independent;At baseline                                       General ADL Comments: increaseed time for LB ADLs such as donning shoes in sitting     Vision Patient Visual Report: No change from baseline       Perception     Praxis      Pertinent Vitals/Pain Pain Assessment: No/denies pain     Hand Dominance     Extremity/Trunk Assessment Upper Extremity Assessment Upper Extremity Assessment: Overall WFL for tasks assessed   Lower Extremity Assessment Lower Extremity Assessment: Overall WFL for tasks assessed       Communication Communication Communication: No difficulties   Cognition Arousal/Alertness: Awake/alert Behavior During Therapy: WFL for tasks assessed/performed Overall Cognitive Status: Within Functional Limits for tasks assessed  General Comments       Exercises Other Exercises Other Exercises: OT ed re: role   Shoulder Instructions      Home Living Family/patient expects to be discharged to:: Private residence Living Arrangements: Spouse/significant other Available Help at Discharge: Family;Available 24 hours/day Type of Home: House Home Access: Stairs to enter [employer] of Steps: 1   Home Layout: One level               Home Equipment: Conservation officer, nature (2 wheels);Shower seat - built in          Prior Functioning/Environment Prior Level of Function : Independent/Modified Independent             Mobility Comments: no AD For fxl mobility          OT Problem List: Decreased activity tolerance;Cardiopulmonary status limiting activity      OT Treatment/Interventions: Self-care/ADL training    OT Goals(Current goals  can be found in the care plan section) Acute Rehab OT Goals Patient Stated Goal: to go home OT Goal Formulation: All assessment and education complete, DC therapy  OT Frequency:     Barriers to D/C:            Co-evaluation              AM-PAC OT "6 Clicks" Daily Activity     Outcome Measure Help from another person eating meals?: None Help from another person taking care of personal grooming?: None Help from another person toileting, which includes using toliet, bedpan, or urinal?: None Help from another person bathing (including washing, rinsing, drying)?: None Help from another person to put on and taking off regular upper body clothing?: None Help from another person to put on and taking off regular lower body clothing?: None 6 Click Score: 24   End of Session Equipment Utilized During Treatment: Gait belt Nurse Communication: Mobility status  Activity Tolerance: Patient tolerated treatment well Patient left: in bed;with call bell/phone within reach  OT Visit Diagnosis: Muscle weakness (generalized) (M62.81)                Time: 5397-6734 OT Time Calculation (min): 12 min Charges:  OT General Charges $OT Visit: 1 Visit OT Evaluation $OT Eval Low Complexity: 1 Low  Gerrianne Scale, MS, OTR/L ascom [phone] 05/23/21, 9:53 AM

## 2021-05-23 NOTE — Care Management CC44 (Signed)
Condition Code 44 Documentation Completed  Patient Details  Name: Desiderio Dolata MRN: [id number] Date of Birth: 1945/07/22   Condition Code 44 given:  Yes Patient signature on Condition Code 44 notice:  Yes Documentation of 2 MD's agreement:  Yes Code 44 added to claim:  Yes    Shelbie Hutching, RN 05/23/2021, 10:15 AM

## 2021-05-23 NOTE — Discharge Summary (Signed)
Physician Discharge Summary  Karl Myers QXI:[id number] DOB: 1945/09/27 DOA: 05/22/2021  PCP: Leone Haven, MD  Admit date: 05/22/2021 Discharge date: 05/23/2021  Admitted From: Home Disposition: Home  Recommendations for Outpatient Follow-up:  Follow up with PCP in 1-2 weeks We will send referral to pulmonary for evaluation, PFT and treatment for COPD.  Home Health: N/A Equipment/Devices: N/A  Discharge Condition: Stable CODE STATUS: DNR Diet recommendation: Low-salt diet, quit smoking  Discharge summary:  75 year old gentleman with history of COPD, ongoing smoker, A. fib on Eliquis presented with 3 days of flulike symptoms.  In the emergency room low-grade temperature 100.7, also noted to have delirium at home.  Initially reported to be she will be short of breath on 5 L oxygen.  CT head normal.  Chest x-ray diffuse interstitial prominence with diffuse bronchial wall thickening, no pneumonia.  COVID-19 negative.  Influenza A positive.  Patient was admitted to the hospital with COPD exacerbation, acute bronchitis secondary to influenza A and acute hypoxemia.  He was treated with bronchodilator therapy, initial high-dose steroids.  On my evaluation in the morning, he is asymptomatic and walking in the hallway.  Mental status is normal.  Denies any shortness of breath or wheezing.  Not used any bronchodilator overnight since 9 PM last night.  Patient likely suffered from bronchitis and hypoxemia secondary to influenza A.  Asymptomatic or minimally symptomatic today. Able to go home. Prednisone taper pack. Tamiflu for 5 days. He does have albuterol inhaler, he was instructed to use it more frequent for next few days. Over-the-counter mucolytic's, avoid any sedatives. Smoking cessation counseling done. On room air. He will need formal evaluation and diagnosis of COPD, has been seen at pulmonary office in the past, will send the referral for PFT and further management. All  other chronic medical issues remained stable and he will resume all his medications including Eliquis.   Discharge Diagnoses:  Principal Problem:   COPD with acute exacerbation ([place]) Active Problems:   Hypertension   Hyperlipidemia   Cervical radiculopathy   Tobacco abuse   CAD (coronary artery disease)   Atrial fibrillation [facility])    Discharge Instructions  Discharge Instructions     Ambulatory referral to Pulmonology   Complete by: As directed    Formal evaluation for COPD, PFT and treatment   Reason for referral: Asthma/COPD   Call MD for:  difficulty breathing, headache or visual disturbances   Complete by: As directed    Diet - low sodium heart healthy   Complete by: As directed    Discharge instructions   Complete by: As directed    Use your inhaler 4-6 times daily as needed . Can take over the counter tylenol. Use plain cough syrup only   Increase activity slowly   Complete by: As directed       Allergies as of 05/23/2021       Reactions   Hydrocodone-acetaminophen Other (See Comments)   With large quantities "hyper"   Neurontin [gabapentin] Other (See Comments)   With large quantities "hyper"        Medication List     STOP taking these medications    escitalopram 5 MG tablet Commonly known as: LEXAPRO   fluorouracil 5 % cream Commonly known as: EFUDEX   ketoconazole 2 % cream Commonly known as: NIZORAL   tiZANidine 4 MG tablet Commonly known as: Zanaflex   traMADol 50 MG tablet Commonly known as: ULTRAM   triamcinolone ointment 0.1 % Commonly known as: KENALOG  TAKE these medications    AeroChamber Plus inhaler Use with inhaler   albuterol 108 (90 Base) MCG/ACT inhaler Commonly known as: VENTOLIN HFA Inhale 1-2 puffs into the lungs every 4 (four) hours as needed for wheezing or shortness of breath.   ALPRAZolam 0.5 MG tablet Commonly known as: XANAX TAKE ONE-HALF TABLET BY MOUTH EVERY MORNING AND TAKE ONE TABLET BY MOUTH  AT BEDTIME AS DIRECTED   apixaban 5 MG Tabs tablet Commonly known as: Eliquis Take 1 tablet (5 mg total) by mouth 2 (two) times daily.   atenolol 25 MG tablet Commonly known as: TENORMIN TAKE ONE TABLET AT BEDTIME   atorvastatin 80 MG tablet Commonly known as: LIPITOR TAKE 1 TABLET BY MOUTH DAILY.   gabapentin 100 MG capsule Commonly known as: NEURONTIN Take by mouth.   guaiFENesin 100 MG/5ML liquid Commonly known as: ROBITUSSIN Take 5 mLs by mouth every 4 (four) hours as needed for cough or to loosen phlegm.   lisinopril 10 MG tablet Commonly known as: ZESTRIL TAKE ONE TABLET AT BEDTIME   mupirocin ointment 2 % Commonly known as: BACTROBAN Apply 1 application topically daily. With dressing changes   nicotine 21 mg/24hr patch Commonly known as: NICODERM CQ - dosed in mg/24 hours Place 1 patch (21 mg total) onto the skin daily. For 6 weeks. Call for next step once on the last week.   nitroGLYCERIN 0.4 MG SL tablet Commonly known as: NITROSTAT Place 1 tablet (0.4 mg total) under the tongue every 5 (five) minutes as needed for chest pain. For up to 3 doses per episode.   oseltamivir 75 MG capsule Commonly known as: TAMIFLU Take 1 capsule (75 mg total) by mouth 2 (two) times daily for 4 days.   predniSONE 10 MG (21) Tbpk tablet Commonly known as: STERAPRED UNI-PAK 21 TAB Take as packet instructions   traZODone 150 MG tablet Commonly known as: DESYREL TAKE ONE TABLET AT BEDTIME        Follow-up Information     Leone Haven, MD Follow up in 2 week(s).   Specialty: Family Medicine Contact information: [address] [place] [address] [phone]                Allergies  Allergen Reactions   Hydrocodone-Acetaminophen Other (See Comments)    With large quantities "hyper"   Neurontin [Gabapentin] Other (See Comments)    With large quantities "hyper"     Consultations: None   Procedures/Studies: DG Chest 2 View  Result  Date: 05/22/2021 CLINICAL DATA:  Cough and fever. EXAM: CHEST - 2 VIEW COMPARISON:  Multiple priors including most recent March 18, 2021 FINDINGS: The heart size and mediastinal contours are within normal limits. Diffuse interstitial prominence is similar in comparison to prior with diffuse bronchial wall thickening. No focal airspace consolidation. No visible pleural effusion or pneumothorax. Thoracic spondylosis. IMPRESSION: Diffuse interstitial prominence with diffuse bronchial wall thickening, without focal airspace consolidation, possibly reflecting bronchitis. Electronically Signed   By: Dahlia Bailiff M.D.   On: 05/22/2021 13:35   CT HEAD WO CONTRAST (5MM)  Result Date: 05/22/2021 CLINICAL DATA:  Mental status change fever EXAM: CT HEAD WITHOUT CONTRAST TECHNIQUE: Contiguous axial images were obtained from the base of the skull through the vertex without intravenous contrast. COMPARISON:  CT brain 04/20/2009 FINDINGS: Brain: No acute territorial infarction, hemorrhage or intracranial mass. Moderate atrophy. Mild chronic small vessel ischemic changes of the white matter. Stable ventricle size. Vascular: No hyperdense vessels.  No unexpected calcification  Skull: Normal. Negative for fracture or focal lesion. Sinuses/Orbits: Patchy mucosal thickening in the sinuses Other: None IMPRESSION: 1. No CT evidence for acute intracranial abnormality. 2. Atrophy Electronically Signed   By: Donavan Foil M.D.   On: 05/22/2021 16:26   (Echo, Carotid, EGD, Colonoscopy, ERCP)    Subjective: Patient seen and examined.  I went to examine him in the emergency room, he was walking around in the room.  Denies any cough wheezing or shortness of breath.  No other events overnight.  Did not need any nebulizer since last night.  Eager to go home.   Discharge Exam: Vitals:   05/23/21 0408 05/23/21 0700  BP: (!) 102/47 120/84  Pulse: 72 73  Resp: 16 18  Temp:    SpO2: 92% 94%   Vitals:   05/23/21 0002 05/23/21  0300 05/23/21 0408 05/23/21 0700  BP:   (!) 102/47 120/84  Pulse:  77 72 73  Resp:  18 16 18   Temp: 98.5 F (36.9 C)     TempSrc: Oral     SpO2:  93% 92% 94%  Weight:      Height:        General: Pt is alert, awake, not in acute distress On room air.  Walking around. Cardiovascular: RRR, S1/S2 +, no rubs, no gallops Respiratory: CTA bilaterally, no wheezing, no rhonchi Abdominal: Soft, NT, ND, bowel sounds + Extremities: no edema, no cyanosis    The results of significant diagnostics from this hospitalization (including imaging, microbiology, ancillary and laboratory) are listed below for reference.     Microbiology: Recent Results (from the past 240 hour(s))  Resp Panel by RT-PCR (Flu A&B, Covid) Nasopharyngeal Swab     Status: Abnormal   Collection Time: 05/22/21  4:52 PM   Specimen: Nasopharyngeal Swab; Nasopharyngeal(NP) swabs in vial transport medium  Result Value Ref Range Status   SARS Coronavirus 2 by RT PCR NEGATIVE NEGATIVE Final    Comment: (NOTE) SARS-CoV-2 target nucleic acids are NOT DETECTED.  The SARS-CoV-2 RNA is generally detectable in upper respiratory specimens during the acute phase of infection. The lowest concentration of SARS-CoV-2 viral copies this assay can detect is 138 copies/mL. A negative result does not preclude SARS-Cov-2 infection and should not be used as the sole basis for treatment or other patient management decisions. A negative result may occur with  improper specimen collection/handling, submission of specimen other than nasopharyngeal swab, presence of viral mutation(s) within the areas targeted by this assay, and inadequate number of viral copies(<138 copies/mL). A negative result must be combined with clinical observations, patient history, and epidemiological information. The expected result is Negative.  Fact Sheet for Patients:  EntrepreneurPulse.com.au  Fact Sheet for Healthcare Providers:   IncredibleEmployment.be  This test is no t yet approved or cleared by the [place] FDA and  has been authorized for detection and/or diagnosis of SARS-CoV-2 by FDA under an Emergency Use Authorization (EUA). This EUA will remain  in effect (meaning this test can be used) for the duration of the COVID-19 declaration under Section 564(b)(1) of the Act, 21 U.S.C.section 360bbb-3(b)(1), unless the authorization is terminated  or revoked sooner.       Influenza A by PCR POSITIVE (A) NEGATIVE Final   Influenza B by PCR NEGATIVE NEGATIVE Final    Comment: (NOTE) The Xpert Xpress SARS-CoV-2/FLU/RSV plus assay is intended as an aid in the diagnosis of influenza from Nasopharyngeal swab specimens and should not be used as a sole basis for treatment. Nasal  washings and aspirates are unacceptable for Xpert Xpress SARS-CoV-2/FLU/RSV testing.  Fact Sheet for Patients: EntrepreneurPulse.com.au  Fact Sheet for Healthcare Providers: IncredibleEmployment.be  This test is not yet approved or cleared by the [place] FDA and has been authorized for detection and/or diagnosis of SARS-CoV-2 by FDA under an Emergency Use Authorization (EUA). This EUA will remain in effect (meaning this test can be used) for the duration of the COVID-19 declaration under Section 564(b)(1) of the Act, 21 U.S.C. section 360bbb-3(b)(1), unless the authorization is terminated or revoked.  Performed at [facility], [address] [place], [place] [address]      Labs: BNP (last 3 results) No results for input(s): BNP in the last 8760 hours. Basic Metabolic Panel: Recent Labs  Lab 05/22/21 1451 05/23/21 0616  NA 137 138  K 4.2 4.6  CL 102 104  CO2 28 27  GLUCOSE 81 117*  BUN 21 21  CREATININE 1.06 0.97  CALCIUM 8.5* 8.6*   Liver Function Tests: Recent Labs  Lab 05/22/21 1451 05/23/21 0616  AST 30 32  ALT 17 18  ALKPHOS 55 51   BILITOT 0.5 0.6  PROT 6.9 6.3*  ALBUMIN 4.3 3.7   Recent Labs  Lab 05/22/21 1451  LIPASE 43   No results for input(s): AMMONIA in the last 168 hours. CBC: Recent Labs  Lab 05/22/21 1451 05/23/21 0616  WBC 8.5 10.3  HGB 14.3 14.1  HCT 43.2 41.5  MCV 101.6* 99.5  PLT 139* 138*   Cardiac Enzymes: No results for input(s): CKTOTAL, CKMB, CKMBINDEX, TROPONINI in the last 168 hours. BNP: Invalid input(s): POCBNP CBG: No results for input(s): GLUCAP in the last 168 hours. D-Dimer No results for input(s): DDIMER in the last 72 hours. Hgb A1c No results for input(s): HGBA1C in the last 72 hours. Lipid Profile No results for input(s): CHOL, HDL, LDLCALC, TRIG, CHOLHDL, LDLDIRECT in the last 72 hours. Thyroid function studies Recent Labs    05/22/21 1451  TSH 0.194*   Anemia work up No results for input(s): VITAMINB12, FOLATE, FERRITIN, TIBC, IRON, RETICCTPCT in the last 72 hours. Urinalysis    Component Value Date/Time   COLORURINE YELLOW (A) 07/24/2019 1508   APPEARANCEUR CLEAR (A) 07/24/2019 1508   LABSPEC 1.006 07/24/2019 1508   PHURINE 6.0 07/24/2019 1508   GLUCOSEU NEGATIVE 07/24/2019 1508   HGBUR NEGATIVE 07/24/2019 1508   BILIRUBINUR NEGATIVE 07/24/2019 1508   KETONESUR NEGATIVE 07/24/2019 1508   PROTEINUR NEGATIVE 07/24/2019 1508   NITRITE NEGATIVE 07/24/2019 1508   LEUKOCYTESUR NEGATIVE 07/24/2019 1508   Sepsis Labs Invalid input(s): PROCALCITONIN,  WBC,  LACTICIDVEN Microbiology Recent Results (from the past 240 hour(s))  Resp Panel by RT-PCR (Flu A&B, Covid) Nasopharyngeal Swab     Status: Abnormal   Collection Time: 05/22/21  4:52 PM   Specimen: Nasopharyngeal Swab; Nasopharyngeal(NP) swabs in vial transport medium  Result Value Ref Range Status   SARS Coronavirus 2 by RT PCR NEGATIVE NEGATIVE Final    Comment: (NOTE) SARS-CoV-2 target nucleic acids are NOT DETECTED.  The SARS-CoV-2 RNA is generally detectable in upper respiratory specimens  during the acute phase of infection. The lowest concentration of SARS-CoV-2 viral copies this assay can detect is 138 copies/mL. A negative result does not preclude SARS-Cov-2 infection and should not be used as the sole basis for treatment or other patient management decisions. A negative result may occur with  improper specimen collection/handling, submission of specimen other than nasopharyngeal swab, presence of viral mutation(s) within the areas  targeted by this assay, and inadequate number of viral copies(<138 copies/mL). A negative result must be combined with clinical observations, patient history, and epidemiological information. The expected result is Negative.  Fact Sheet for Patients:  EntrepreneurPulse.com.au  Fact Sheet for Healthcare Providers:  IncredibleEmployment.be  This test is no t yet approved or cleared by the [place] FDA and  has been authorized for detection and/or diagnosis of SARS-CoV-2 by FDA under an Emergency Use Authorization (EUA). This EUA will remain  in effect (meaning this test can be used) for the duration of the COVID-19 declaration under Section 564(b)(1) of the Act, 21 U.S.C.section 360bbb-3(b)(1), unless the authorization is terminated  or revoked sooner.       Influenza A by PCR POSITIVE (A) NEGATIVE Final   Influenza B by PCR NEGATIVE NEGATIVE Final    Comment: (NOTE) The Xpert Xpress SARS-CoV-2/FLU/RSV plus assay is intended as an aid in the diagnosis of influenza from Nasopharyngeal swab specimens and should not be used as a sole basis for treatment. Nasal washings and aspirates are unacceptable for Xpert Xpress SARS-CoV-2/FLU/RSV testing.  Fact Sheet for Patients: EntrepreneurPulse.com.au  Fact Sheet for Healthcare Providers: IncredibleEmployment.be  This test is not yet approved or cleared by the [place] FDA and has been authorized for detection  and/or diagnosis of SARS-CoV-2 by FDA under an Emergency Use Authorization (EUA). This EUA will remain in effect (meaning this test can be used) for the duration of the COVID-19 declaration under Section 564(b)(1) of the Act, 21 U.S.C. section 360bbb-3(b)(1), unless the authorization is terminated or revoked.  Performed at [facility], [address] [place], [place] [address]      Time coordinating discharge:  35 minutes  SIGNED:   Barb Merino, MD  [facility] 05/23/2021, 9:10 AM

## 2021-05-23 NOTE — Care Management Obs Status (Signed)
[place] NOTIFICATION   Patient Details  Name: Karl Myers MRN: [id number] Date of Birth: Jul 22, 1945   Medicare Observation Status Notification Given:  Yes    Shelbie Hutching, RN 05/23/2021, 10:15 AM

## 2021-05-26 ENCOUNTER — Telehealth: Payer: Self-pay

## 2021-05-26 NOTE — Telephone Encounter (Signed)
Transition Care Management Unsuccessful Follow-up Telephone Call  Date of discharge and from where:  05/23/21-[facility]  Attempts:  1st Attempt  Reason for unsuccessful TCM follow-up call:  Unable to reach patient. Will follow.

## 2021-05-27 LAB — CULTURE, BLOOD (ROUTINE X 2)
Culture: NO GROWTH
Culture: NO GROWTH

## 2021-05-27 NOTE — Telephone Encounter (Signed)
Transition Care Management Unsuccessful Follow-up Telephone Call  Date of discharge and from where:  05/23/21-[facility]  Attempts:  2nd Attempt  Reason for unsuccessful TCM follow-up call:  notes feeling better and declines scheduling HFU at this time. Agrees to call office if worsening symptoms and as needed. HFU scheduled with Pulmonary 07/01/21. TCM closed.

## 2021-06-03 ENCOUNTER — Other Ambulatory Visit: Payer: Self-pay | Admitting: Family Medicine

## 2021-06-03 LAB — BLOOD GAS, VENOUS
Acid-Base Excess: 2.2 mmol/L — ABNORMAL HIGH (ref 0.0–2.0)
Bicarbonate: 30.2 mmol/L — ABNORMAL HIGH (ref 20.0–28.0)
O2 Saturation: 38.5 %
Patient temperature: 37
pCO2, Ven: 60 mmHg (ref 44.0–60.0)
pH, Ven: 7.31 (ref 7.250–7.430)

## 2021-06-20 ENCOUNTER — Ambulatory Visit: Payer: PPO | Admitting: Family Medicine

## 2021-06-24 ENCOUNTER — Other Ambulatory Visit: Payer: Self-pay | Admitting: Family Medicine

## 2021-06-25 NOTE — Telephone Encounter (Signed)
Refilled: 05/06/2021 Last OV: 12/17/2020 Next OV: 07/07/2021

## 2021-07-01 ENCOUNTER — Other Ambulatory Visit: Payer: Self-pay

## 2021-07-01 ENCOUNTER — Encounter: Payer: Self-pay | Admitting: Pulmonary Disease

## 2021-07-01 ENCOUNTER — Ambulatory Visit: Payer: PPO | Admitting: Pulmonary Disease

## 2021-07-01 VITALS — BP 118/66 | HR 54 | Temp 97.6°F | Ht 68.0 in | Wt 162.4 lb

## 2021-07-01 DIAGNOSIS — R0602 Shortness of breath: Secondary | ICD-10-CM

## 2021-07-01 DIAGNOSIS — F17210 Nicotine dependence, cigarettes, uncomplicated: Secondary | ICD-10-CM

## 2021-07-01 DIAGNOSIS — J449 Chronic obstructive pulmonary disease, unspecified: Secondary | ICD-10-CM

## 2021-07-01 MED ORDER — VARENICLINE TARTRATE 1 MG PO TABS: 1.0000 mg | ORAL_TABLET | Freq: Two times a day (BID) | ORAL | 1 refills | Status: DC

## 2021-07-01 MED ORDER — ANORO ELLIPTA 62.5-25 MCG/ACT IN AEPB: 1.0000 | INHALATION_SPRAY | Freq: Every day | RESPIRATORY_TRACT | 0 refills | Status: DC

## 2021-07-01 MED ORDER — CHANTIX STARTING MONTH PAK 0.5 MG X 11 & 1 MG X 42 PO TBPK: 1.0000 | ORAL_TABLET | ORAL | 0 refills | Status: DC

## 2021-07-01 NOTE — Patient Instructions (Signed)
I have sent that prescription of Chantix to your pharmacy.  Follow the instructions in the package.  We are giving you a trial of an inhaler called Anoro, this is 1 puff daily.  Please let us know how you do with this inhaler so that we can call in the prescription for you.  We are going to get breathing test to determine the status of your lung health.  We will see you in follow-up in 2 months time call sooner should any new problems arise.

## 2021-07-01 NOTE — Progress Notes (Signed)
Subjective:    Patient ID: Karl Myers, male    DOB: [birth year], 76 y.o.   MRN: [id number] Chief Complaint  Patient presents with   Consult    Copd f/u    HPI Patient is a 76 year old current smoker (5 cigarettes/day, 74 PY) who presents for evaluation of probable COPD.  Patient is kindly referred by Dr. Tommi Rumps.  The patient is that he has had issues with shortness of breath on exertion for "months".  He notes lack of energy.  Had [place] in February 2021 and noted fatigue after that.  Shortness of breath has become more evident doing his yard work, splitting wood and doing his gardening.  He does use a wood stove in the home.  Shortness of breath is only relieved by rest.  Has tried an albuterol inhaler in the past felt that this did not help him.  Recently was in the ED at [facility] for flu A.  Treated with Tamiflu.  He also required nebulizers in the ED.  He eventually did not need admission and was discharged from the ED after approximately 36 hours.  He has a chronic morning cough that is usually nonproductive.  He does not endorse any chest pain.  No orthopnea or paroxysmal nocturnal dyspnea, no lower extremity edema.  No calf tenderness.  No weight loss or anorexia.  Has never had lung cancer screening CT.  Patient was in the [employer] for 2 years, [employer], served only within [employer].  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  He has since retired from that.  He continues to smoke 4 to 5 cigarettes a day.  Has a 60-pack-year history.  He desires to quit smoking however states that nicotine patches have not helped him.  He inquires about Chantix.  Patient does not endorse any other symptomatology today.  Review of Systems A 10 point review of systems was performed and it is as noted above otherwise negative.  Past Medical History:  Diagnosis Date   Actinic keratosis    Arthritis    COPD (chronic obstructive pulmonary disease) (HCC)    DDD (degenerative  disc disease), cervical    DDD (degenerative disc disease), cervical    Depression    History of COVID-19    Hypertension    Melanoma ([place]) 09/04/2019   R 5th toe lateral tip, Stage IV, 0.20mm Breslow's   Pre-diabetes    Past Surgical History:  Procedure Laterality Date   BACK SURGERY     x 3   CARPAL TUNNEL RELEASE Right 08/19/2017   Procedure: CARPAL TUNNEL RELEASE;  Surgeon: Hessie Knows, MD;  Location: [facility] ORS;  Service: Orthopedics;  Laterality: Right;   CATARACT EXTRACTION W/PHACO Right 11/17/2016   Procedure: CATARACT EXTRACTION PHACO AND INTRAOCULAR LENS PLACEMENT (IOC);  Surgeon: Birder Robson, MD;  Location: [facility] ORS;  Service: Ophthalmology;  Laterality: Right;  [place] 00:37 AP% 12.6 CDE 4.69 Fluid pack lot # [id number]   CATARACT EXTRACTION W/PHACO Left 12/08/2016   Procedure: CATARACT EXTRACTION PHACO AND INTRAOCULAR LENS PLACEMENT (IOC);  Surgeon: Birder Robson, MD;  Location: [facility] ORS;  Service: Ophthalmology;  Laterality: Left;  [place] 00:33 AP% 13.9 CDE 4.63 Fluid pack lot # [id number]   ULNAR TUNNEL RELEASE Right 08/19/2017   Procedure: CUBITAL TUNNEL RELEASE;  Surgeon: Hessie Knows, MD;  Location: [facility] ORS;  Service: Orthopedics;  Laterality: Right;   Family History  Problem Relation Age of Onset   Heart attack Mother    Prostate  cancer Father    Breast cancer Sister 48   Dementia Sister    Cancer Brother        unknown what kind   Social History   Tobacco Use   Smoking status: Every Day    Packs/day: 1.00    Years: 60.00    Pack years: 60.00    Types: Cigars, Cigarettes   Smokeless tobacco: Never   Tobacco comments:    Patient smokes cigars occasionally    Currently smoke 4-5 cigs daily - 07/01/2021   Substance Use Topics   Alcohol use: No   Allergies  Allergen Reactions   Hydrocodone-Acetaminophen Other (See Comments)    With large quantities "hyper"   Neurontin [Gabapentin] Other (See Comments)    With large quantities "hyper"    Current Meds   Medication Sig   ALPRAZolam (XANAX) 0.5 MG tablet TAKE ONE-HALF TABLET BY MOUTH EVERY MORNING AND TAKE ONE TABLET BY MOUTH AT BEDTIME AS DIRECTED   apixaban (ELIQUIS) 5 MG TABS tablet Take 1 tablet (5 mg total) by mouth 2 (two) times daily.   atenolol (TENORMIN) 25 MG tablet TAKE ONE TABLET AT BEDTIME   atorvastatin (LIPITOR) 80 MG tablet TAKE 1 TABLET BY MOUTH DAILY.   gabapentin (NEURONTIN) 100 MG capsule Take 100 mg by mouth 3 (three) times daily.   lisinopril (ZESTRIL) 10 MG tablet TAKE ONE TABLET AT BEDTIME   nitroGLYCERIN (NITROSTAT) 0.4 MG SL tablet Place 1 tablet (0.4 mg total) under the tongue every 5 (five) minutes as needed for chest pain. For up to 3 doses per episode.   Spacer/Aero-Holding Chambers (AEROCHAMBER PLUS) inhaler Use with inhaler   traZODone (DESYREL) 150 MG tablet TAKE ONE TABLET AT BEDTIME   umeclidinium-vilanterol (ANORO ELLIPTA) 62.5-25 MCG/ACT AEPB Inhale 1 puff into the lungs daily.   [START ON 07/30/2021] varenicline (CHANTIX CONTINUING MONTH PAK) 1 MG tablet Take 1 tablet (1 mg total) by mouth 2 (two) times daily.   Varenicline Tartrate, Starter, (CHANTIX STARTING MONTH PAK) 0.5 MG X 11 & 1 MG X 42 TBPK Take 1 tablet by mouth as directed. Follow package instructions   [DISCONTINUED] albuterol (VENTOLIN HFA) 108 (90 Base) MCG/ACT inhaler Inhale 1-2 puffs into the lungs every 4 (four) hours as needed for wheezing or shortness of breath.   [DISCONTINUED] gabapentin (NEURONTIN) 100 MG capsule Take by mouth.   [DISCONTINUED] mupirocin ointment (BACTROBAN) 2 % Apply 1 application topically daily. With dressing changes   [DISCONTINUED] nicotine (NICODERM CQ - DOSED IN MG/24 HOURS) 21 mg/24hr patch Place 1 patch (21 mg total) onto the skin daily. For 6 weeks. Call for next step once on the last week.   [DISCONTINUED] predniSONE (STERAPRED UNI-PAK 21 TAB) 10 MG (21) TBPK tablet Take as packet instructions    Immunization History  Administered Date(s) Administered    Fluad Quad(high Dose 65+) 02/20/2019, 03/07/2020, 04/21/2021   Influenza, High Dose Seasonal PF 03/12/2017, 03/31/2018   Influenza-Unspecified 02/20/2019   PFIZER(Purple Top)SARS-COV-2 Vaccination 10/27/2019, 12/01/2019   Zoster Recombinat (Shingrix) 12/03/2020       Objective:   Physical Exam BP 118/66 (BP Location: Left Arm, Patient Position: Sitting, Cuff Size: Normal)    Pulse (!) 54    Temp 97.6 F (36.4 C) (Oral)    Ht 5\' 8"  (1.727 m)    Wt 162 lb 6.4 oz (73.7 kg)    SpO2 99%    BMI 24.69 kg/m  GENERAL: Well, well-nourished gentleman, no acute distress, fully ambulatory.  No conversational dyspnea. HEAD: Normocephalic,  atraumatic.  EYES: Pupils equal, round, reactive to light.  No scleral icterus.  MOUTH: Nose/mouth/throat not examined due to masking requirements for COVID 19. NECK: Supple. No thyromegaly. Trachea midline. No JVD.  No adenopathy. PULMONARY: Good air entry bilaterally.  Coarse, otherwise, no adventitious sounds. CARDIOVASCULAR: S1 and S2. Regular rate and rhythm.  No rubs, murmurs or gallops heard. ABDOMEN: Benign. MUSCULOSKELETAL: No joint deformity, no clubbing, no edema.  NEUROLOGIC: No focal deficit, no gait disturbance, speech is fluent. SKIN: Intact,warm,dry. PSYCH: Mood and behavior normal.   Last chest x-ray was 22 May 2021 shows some interstitial markings in the setting of flu A illness:     Assessment & Plan:     ICD-10-CM   1. COPD suggested by initial evaluation [facility])  J44.9 Pulmonary Function Test [facility] Only   We will obtain PFTs Trial of Anoro Ellipta 1 inhalation daily Follow-up 2 months    2. Shortness of breath  R06.02    Suspect due to poorly compensated COPD Plan as above    3. Tobacco dependence due to cigarettes  F17.210 Ambulatory Referral for Lung Cancer Scre   Patient counseled regards to discontinuation of smoking Provided with Chantix prescription Refer to lung cancer screening program     Orders Placed This Encounter   Procedures   Ambulatory Referral for Lung Cancer Scre    Referral Priority:   Routine    Referral Type:   Consultation    Referral Reason:   Specialty Services Required    Referred to Provider:   Magdalen Spatz, NP    Number of Visits Requested:   1   Pulmonary Function Test [facility] Only    Standing Status:   Future    Standing Expiration Date:   07/01/2022    Order Specific Question:   Full PFT: includes the following: basic spirometry, spirometry pre & post bronchodilator, diffusion capacity (DLCO), lung volumes    Answer:   Full PFT    Order Specific Question:   This test can only be performed at    Answer:   [place] ordered this encounter  Medications   Varenicline Tartrate, Starter, (CHANTIX STARTING MONTH PAK) 0.5 MG X 11 & 1 MG X 42 TBPK    Sig: Take 1 tablet by mouth as directed. Follow package instructions    Dispense:  1 each    Refill:  0   varenicline (CHANTIX CONTINUING MONTH PAK) 1 MG tablet    Sig: Take 1 tablet (1 mg total) by mouth 2 (two) times daily.    Dispense:  60 tablet    Refill:  1   umeclidinium-vilanterol (ANORO ELLIPTA) 62.5-25 MCG/ACT AEPB    Sig: Inhale 1 puff into the lungs daily.    Dispense:  14 each    Refill:  0    Order Specific Question:   Lot Number?    Answer:   [id number]    Order Specific Question:   Expiration Date?    Answer:   10/06/2022    Order Specific Question:   Quantity    Answer:   4   We will see the patient in follow-up in 2 months time he is to contact us prior to that time should any new difficulties arise.  He was also asked to contact us and let us know if an oral Ellipta is working for him so we can send prescription to his pharmacy.  The patient will be enrolled in lung cancer screening LDCT program.  Renold Don, MD Advanced Bronchoscopy PCCM [facility]    *This note was dictated using voice recognition software/Dragon.  Despite best efforts to proofread, errors can occur which can  change the meaning. Any transcriptional errors that result from this process are unintentional and may not be fully corrected at the time of dictation.

## 2021-07-07 ENCOUNTER — Ambulatory Visit (INDEPENDENT_AMBULATORY_CARE_PROVIDER_SITE_OTHER): Payer: PPO | Admitting: Family Medicine

## 2021-07-07 ENCOUNTER — Other Ambulatory Visit: Payer: Self-pay

## 2021-07-07 ENCOUNTER — Ambulatory Visit (INDEPENDENT_AMBULATORY_CARE_PROVIDER_SITE_OTHER): Payer: PPO

## 2021-07-07 ENCOUNTER — Encounter: Payer: Self-pay | Admitting: Family Medicine

## 2021-07-07 VITALS — BP 130/70 | HR 63 | Temp 97.7°F | Ht 68.0 in | Wt 164.4 lb

## 2021-07-07 DIAGNOSIS — Z720 Tobacco use: Secondary | ICD-10-CM | POA: Diagnosis not present

## 2021-07-07 DIAGNOSIS — J449 Chronic obstructive pulmonary disease, unspecified: Secondary | ICD-10-CM | POA: Diagnosis not present

## 2021-07-07 DIAGNOSIS — Z125 Encounter for screening for malignant neoplasm of prostate: Secondary | ICD-10-CM | POA: Diagnosis not present

## 2021-07-07 DIAGNOSIS — Z8042 Family history of malignant neoplasm of prostate: Secondary | ICD-10-CM

## 2021-07-07 DIAGNOSIS — I10 Essential (primary) hypertension: Secondary | ICD-10-CM | POA: Diagnosis not present

## 2021-07-07 DIAGNOSIS — M25561 Pain in right knee: Secondary | ICD-10-CM

## 2021-07-07 DIAGNOSIS — I480 Paroxysmal atrial fibrillation: Secondary | ICD-10-CM

## 2021-07-07 DIAGNOSIS — R7303 Prediabetes: Secondary | ICD-10-CM

## 2021-07-07 DIAGNOSIS — G479 Sleep disorder, unspecified: Secondary | ICD-10-CM

## 2021-07-07 LAB — COMPREHENSIVE METABOLIC PANEL
ALT: 15 U/L (ref 0–53)
AST: 19 U/L (ref 0–37)
Albumin: 4.2 g/dL (ref 3.5–5.2)
Alkaline Phosphatase: 75 U/L (ref 39–117)
BUN: 13 mg/dL (ref 6–23)
CO2: 29 mEq/L (ref 19–32)
Calcium: 8.9 mg/dL (ref 8.4–10.5)
Chloride: 104 mEq/L (ref 96–112)
Creatinine, Ser: 0.95 mg/dL (ref 0.40–1.50)
GFR: 78.11 mL/min (ref >60.00)
Glucose, Bld: 108 mg/dL — ABNORMAL HIGH (ref 70–99)
Potassium: 4.6 mEq/L (ref 3.5–5.1)
Sodium: 140 mEq/L (ref 135–145)
Total Bilirubin: 0.6 mg/dL (ref 0.2–1.2)
Total Protein: 6.3 g/dL (ref 6.0–8.3)

## 2021-07-07 LAB — LIPID PANEL
Cholesterol: 128 mg/dL (ref 0–200)
HDL: 46.6 mg/dL (ref >39.00)
LDL Cholesterol: 68 mg/dL (ref 0–99)
NonHDL: 81.04
Total CHOL/HDL Ratio: 3
Triglycerides: 63 mg/dL (ref 0.0–149.0)
VLDL: 12.6 mg/dL (ref 0.0–40.0)

## 2021-07-07 LAB — HEMOGLOBIN A1C: Hgb A1c MFr Bld: 6.1 % (ref 4.6–6.5)

## 2021-07-07 LAB — PSA, MEDICARE: PSA: 2.13 ng/ml (ref 0.10–4.00)

## 2021-07-07 MED ORDER — ALPRAZOLAM 0.5 MG PO TABS: ORAL_TABLET | ORAL | 2 refills | Status: DC

## 2021-07-07 NOTE — Assessment & Plan Note (Signed)
Sinus rhythm today.  He will continue atenolol 25 mg once daily and Eliquis 5 mg twice daily.

## 2021-07-07 NOTE — Assessment & Plan Note (Signed)
Congratulated on the decision to quit smoking.  I encouraged him to stick with quitting after he finishes his 7 cigarettes.  He was referred to lung cancer screening by the pulmonologist.

## 2021-07-07 NOTE — Progress Notes (Signed)
Tommi Rumps, MD Phone: [phone]  Karl Myers is a 76 y.o. male who presents today for f/u.  HYPERTENSION/AFIB Disease Monitoring Home BP Monitoring "good" states systolic is aroun 366 Chest pain- no    Dyspnea- no Palpitations- no Medications Compliance-  taking atneolol, lisinopril, eliquis.   Edema- no Bleeding- no BMET    Component Value Date/Time   NA 138 05/23/2021 0616   NA 137 05/06/2014 0751   K 4.6 05/23/2021 0616   K 4.1 05/06/2014 0751   CL 104 05/23/2021 0616   CL 103 05/06/2014 0751   CO2 27 05/23/2021 0616   CO2 28 05/06/2014 0751   GLUCOSE 117 (H) 05/23/2021 0616   GLUCOSE 121 (H) 05/06/2014 0751   BUN 21 05/23/2021 0616   BUN 10 05/06/2014 0751   CREATININE 0.97 05/23/2021 0616   CREATININE 1.00 06/17/2018 1439   CALCIUM 8.6 (L) 05/23/2021 0616   CALCIUM 8.6 05/06/2014 0751   GFRNONAA >60 05/23/2021 0616   GFRNONAA >60 05/06/2014 0751   GFRAA >60 07/27/2019 0259   GFRAA >60 05/06/2014 0751   COPD: Medication compliance- anoro every day  Dyspnea- no  Wheezing- notes his wife has noted some wheezing  Cough- morning cough that is chronic and stable  Productive- no Reports he has 7 cigarettes left and then he is going to quit. He was recently admitted for COPD exacerbation related to influenza A.  He reports he has recovered well from this.  Insomnia: Patient notes this is adequately controlled.  He takes trazodone and Xanax nightly.  There is no associated drowsiness.  He gets about 6 hours of sleep plus a 2-hour nap before bedtime.  Right knee pain: This has been going on a couple of weeks.  He describes it as achy.  It hurts along the medial and lateral joint lines and the anterior knee.  It started after he went up a 4 to 5 step ladder 10-15 times.  He notes no specific injury.  It does not pop or lock up.  It does feel like it is going to give out at times.  Aspercreme and Tylenol are beneficial.   Social History   Tobacco Use  Smoking  Status Every Day   Packs/day: 1.00   Years: 60.00   Pack years: 60.00   Types: Cigars, Cigarettes  Smokeless Tobacco Never  Tobacco Comments   Patient smokes cigars occasionally   Currently smoke 4-5 cigs daily - 07/01/2021     Current Outpatient Medications on File Prior to Visit  Medication Sig Dispense Refill   apixaban (ELIQUIS) 5 MG TABS tablet Take 1 tablet (5 mg total) by mouth 2 (two) times daily. 90 tablet 3   atenolol (TENORMIN) 25 MG tablet TAKE ONE TABLET AT BEDTIME 90 tablet 3   atorvastatin (LIPITOR) 80 MG tablet TAKE 1 TABLET BY MOUTH DAILY. 90 tablet 1   gabapentin (NEURONTIN) 100 MG capsule Take 100 mg by mouth 3 (three) times daily.     lisinopril (ZESTRIL) 10 MG tablet TAKE ONE TABLET AT BEDTIME 90 tablet 3   nitroGLYCERIN (NITROSTAT) 0.4 MG SL tablet Place 1 tablet (0.4 mg total) under the tongue every 5 (five) minutes as needed for chest pain. For up to 3 doses per episode. 50 tablet 0   Spacer/Aero-Holding Chambers (AEROCHAMBER PLUS) inhaler Use with inhaler 1 each 2   traZODone (DESYREL) 150 MG tablet TAKE ONE TABLET AT BEDTIME 90 tablet 1   umeclidinium-vilanterol (ANORO ELLIPTA) 62.5-25 MCG/ACT AEPB Inhale 1 puff into  the lungs daily. 14 each 0   [START ON 07/30/2021] varenicline (CHANTIX CONTINUING MONTH PAK) 1 MG tablet Take 1 tablet (1 mg total) by mouth 2 (two) times daily. 60 tablet 1   Varenicline Tartrate, Starter, (CHANTIX STARTING MONTH PAK) 0.5 MG X 11 & 1 MG X 42 TBPK Take 1 tablet by mouth as directed. Follow package instructions 1 each 0   No current facility-administered medications on file prior to visit.     ROS see history of present illness  Objective  Physical Exam Vitals:   07/07/21 0829  BP: 130/70  Pulse: 63  Temp: 97.7 F (36.5 C)  SpO2: 98%    BP Readings from Last 3 Encounters:  07/07/21 130/70  07/01/21 118/66  05/23/21 120/84   Wt Readings from Last 3 Encounters:  07/07/21 164 lb 6.4 oz (74.6 kg)  07/01/21 162 lb 6.4  oz (73.7 kg)  05/22/21 168 lb (76.2 kg)    Physical Exam Constitutional:      General: He is not in acute distress.    Appearance: He is not diaphoretic.  Cardiovascular:     Rate and Rhythm: Normal rate and regular rhythm.     Heart sounds: Normal heart sounds.  Pulmonary:     Effort: Pulmonary effort is normal.     Breath sounds: Normal breath sounds.  Musculoskeletal:     Comments: Right knee with no swelling, warmth, or erythema, there is tenderness along the medial and lateral joint line as well as the patellar tendon and just inferior to these areas, discomfort on McMurray's though no palpable clicking  Skin:    General: Skin is warm and dry.  Neurological:     Mental Status: He is alert.     Assessment/Plan: Please see individual problem list.  Problem List Items Addressed This Visit     Atrial fibrillation (HCC)    Sinus rhythm today.  He will continue atenolol 25 mg once daily and Eliquis 5 mg twice daily.      COPD (chronic obstructive pulmonary disease) (HCC)    Symptoms have been stable and adequately controlled on Anoro.  He will continue this and continue to follow with pulmonology.      Family history of prostate cancer in father    PSA to be checked today.      Hypertension - Primary    Adequately controlled.  He will continue atenolol 25 mg once daily and lisinopril 10 mg once daily.  Check lab work today.      Relevant Orders   Comp Met (CMET)   Lipid panel   Right knee pain    Possibly related to arthritis versus a meniscal issue.  Discussed that this would likely be more of a chronic meniscal issue if it was related to his meniscus.  We will start with an x-ray.  Discussed the potential for exercises at home and seeing an orthopedist in the future if this is not improving.  He can continue Aspercreme and Tylenol for pain control.      Relevant Orders   DG Knee Complete 4 Views Right   Sleeping difficulty    Chronic issue.  This is stable.  He  can continue trazodone 150 mg at night and as needed Xanax.      Relevant Medications   ALPRAZolam (XANAX) 0.5 MG tablet   Tobacco abuse    Congratulated on the decision to quit smoking.  I encouraged him to stick with quitting after he finishes his 7  cigarettes.  He was referred to lung cancer screening by the pulmonologist.      Other Visit Diagnoses     Prostate cancer screening       Relevant Orders   PSA, Medicare ( [place] only)   Prediabetes       Relevant Orders   HgB A1c        Return in about 6 months (around 01/04/2022) for Hypertension/sleeping difficulty.  This visit occurred during the SARS-CoV-2 public health emergency.  Safety protocols were in place, including screening questions prior to the visit, additional usage of staff PPE, and extensive cleaning of exam room while observing appropriate contact time as indicated for disinfecting solutions.    Tommi Rumps, MD [place]

## 2021-07-07 NOTE — Assessment & Plan Note (Signed)
Chronic issue.  This is stable.  He can continue trazodone 150 mg at night and as needed Xanax.

## 2021-07-07 NOTE — Assessment & Plan Note (Signed)
Adequately controlled.  He will continue atenolol 25 mg once daily and lisinopril 10 mg once daily.  Check lab work today.

## 2021-07-07 NOTE — Assessment & Plan Note (Signed)
Possibly related to arthritis versus a meniscal issue.  Discussed that this would likely be more of a chronic meniscal issue if it was related to his meniscus.  We will start with an x-ray.  Discussed the potential for exercises at home and seeing an orthopedist in the future if this is not improving.  He can continue Aspercreme and Tylenol for pain control.

## 2021-07-07 NOTE — Assessment & Plan Note (Signed)
Symptoms have been stable and adequately controlled on Anoro.  He will continue this and continue to follow with pulmonology.

## 2021-07-07 NOTE — Patient Instructions (Addendum)
Nice to see you. We will get an x-ray and lab work today. We will let you know what the next step will be for your knee is once your x-ray returns.

## 2021-07-07 NOTE — Assessment & Plan Note (Signed)
PSA to be checked today.

## 2021-07-10 ENCOUNTER — Telehealth: Payer: Self-pay | Admitting: Family Medicine

## 2021-07-10 ENCOUNTER — Telehealth: Payer: Self-pay

## 2021-07-10 NOTE — Telephone Encounter (Signed)
Pt called in requesting for lab and xray results. Pt requesting callback

## 2021-07-10 NOTE — Telephone Encounter (Signed)
Patient called in requesting to speak with [name] , Patient want to see if Dr Caryl Bis can prescribe Gabaoentin 100 MG, Previous prescribe from Dr Laverta Baltimore from [facility] .

## 2021-07-11 MED ORDER — GABAPENTIN 100 MG PO CAPS: 100.0000 mg | ORAL_CAPSULE | Freq: Three times a day (TID) | ORAL | 1 refills | Status: DC

## 2021-07-11 NOTE — Telephone Encounter (Signed)
I sent this to the pharmacy. Please confirm exactly what he is taking this for. Thanks.

## 2021-07-11 NOTE — Telephone Encounter (Signed)
Patient uses it for his toe that was removed, the nerve p[ain.  Karl Myers,cma

## 2021-07-11 NOTE — Telephone Encounter (Signed)
Noted  

## 2021-07-11 NOTE — Telephone Encounter (Signed)
I called the patient back on yesterday and gave results.  Karl Myers,cma

## 2021-07-12 ENCOUNTER — Other Ambulatory Visit: Payer: Self-pay | Admitting: Family Medicine

## 2021-07-12 DIAGNOSIS — R972 Elevated prostate specific antigen [PSA]: Secondary | ICD-10-CM

## 2021-07-12 DIAGNOSIS — M25561 Pain in right knee: Secondary | ICD-10-CM

## 2021-08-01 ENCOUNTER — Other Ambulatory Visit: Payer: Self-pay

## 2021-08-01 ENCOUNTER — Encounter: Payer: Self-pay | Admitting: Urology

## 2021-08-01 ENCOUNTER — Ambulatory Visit: Payer: PPO | Admitting: Urology

## 2021-08-01 VITALS — BP 128/72 | HR 65 | Ht 68.0 in | Wt 165.0 lb

## 2021-08-01 DIAGNOSIS — R972 Elevated prostate specific antigen [PSA]: Secondary | ICD-10-CM

## 2021-08-01 DIAGNOSIS — N401 Enlarged prostate with lower urinary tract symptoms: Secondary | ICD-10-CM

## 2021-08-01 MED ORDER — SILODOSIN 8 MG PO CAPS: 8.0000 mg | ORAL_CAPSULE | Freq: Every day | ORAL | 0 refills | Status: DC

## 2021-08-01 NOTE — Progress Notes (Addendum)
08/01/2021 9:56 AM   [name] 1945/09/04 [id number]  Referring provider: Glori Luis, MD [address] [place],  [place] [address]  Chief Complaint  Patient presents with   Elevated PSA    HPI: Min Sarra is a 76 y.o. male referred for evaluation of an elevated PSA.  PSA 07/07/2021 was 2.13.  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 Breslow's   Pre-diabetes     Surgical History: Past Surgical History:  Procedure Laterality Date   BACK SURGERY     x 3   CARPAL TUNNEL RELEASE Right 08/19/2017   Procedure: CARPAL TUNNEL RELEASE;  Surgeon: Kennedy Bucker, MD;  Location: [facility] ORS;  Service: Orthopedics;  Laterality: Right;   CATARACT EXTRACTION W/PHACO Right 11/17/2016   Procedure: CATARACT EXTRACTION PHACO AND INTRAOCULAR LENS PLACEMENT (IOC);  Surgeon: Galen Manila, MD;  Location: [facility] OR[facility];  Service: Ophthalmology;  Laterality: Right;  [place] 00:37 AP% 12.6 CDE 4.69 Fluid pack lot # [id number]   CATARACT EXTRACTION W/PHACO Left 12/08/2016   Procedure: CATARACT EXTRACTION PHACO AND INTRAOCULAR LENS PLACEMENT (IOC);  Surgeon: Galen Manila, MD;  Location: [facility] OR[facility];  Service: Ophthalmology;  Laterality: Left;  [place] 00:33 AP% 13.9 CDE 4.63 Fluid pack lot # [id number]   ULNAR TUNNEL RELEASE Right 08/19/2017   Procedure: CUBITAL TUNNEL RELEASE;  Surgeon: Kennedy Bucker, MD;  Location: [facility];  Service:  Orthopedics;  Laterality: Right;    Home Medications:  Allergies as of 08/01/2021       Reactions   Hydrocodone-acetaminophen Other (See Comments)   With large quantities "hyper"   Neurontin [gabapentin] Other (See Comments)   With large quantities "hyper"        Medication List        Accurate as of August 01, 2021  9:56 AM. If you have any questions, ask your nurse or doctor.          AeroChamber Plus inhaler Use with inhaler   ALPRAZolam 0.5 MG tablet Commonly known as: XANAX TAKE ONE-HALF TABLET BY MOUTH EVERY MORNING AND TAKE ONE TABLET BY MOUTH AT BEDTIME AS DIRECTED   Anoro Ellipta 62.5-25 MCG/ACT Aepb Generic drug: umeclidinium-vilanterol Inhale 1 puff into the lungs daily.   apixaban 5 MG Tabs tablet Commonly known as: Eliquis Take 1 tablet (5 mg total) by mouth 2 (two) times daily.   atenolol 25 MG tablet Commonly known as: TENORMIN TAKE ONE TABLET AT BEDTIME   atorvastatin 80 MG tablet Commonly known as: LIPITOR TAKE 1 TABLET BY MOUTH DAILY.   Chantix Starting Month Pak 0.5 MG X 11 & 1 MG X 42 Tbpk Generic drug: Varenicline Tartrate (Starter) Take 1 tablet by mouth as directed. Follow package instructions   varenicline 1 MG tablet Commonly known as: Chantix Continuing Month Pak Take 1 tablet (1 mg total) by mouth 2 (two) times daily.   gabapentin 100 MG capsule Commonly known as:  NEURONTIN Take 1 capsule (100 mg total) by mouth 3 (three) times daily.   lisinopril 10 MG tablet Commonly known as: ZESTRIL TAKE ONE TABLET AT BEDTIME   nitroGLYCERIN 0.4 MG SL tablet Commonly known as: NITROSTAT Place 1 tablet (0.4 mg total) under the tongue every 5 (five) minutes as needed for chest pain. For up to 3 doses per episode.   traZODone 150 MG tablet Commonly known as: DESYREL TAKE ONE TABLET AT BEDTIME        Allergies:  Allergies  Allergen Reactions   Hydrocodone-Acetaminophen Other (See Comments)    With large quantities "hyper"    Neurontin [Gabapentin] Other (See Comments)    With large quantities "hyper"     Family History: Family History  Problem Relation Age of Onset   Heart attack Mother    Prostate cancer Father    Breast cancer Sister 47   Dementia Sister    Cancer Brother        unknown what kind    Social History:  reports that he has been smoking cigars and cigarettes. He has a 60.00 pack-year smoking history. He has never used smokeless tobacco. He reports that he does not drink alcohol and does not use drugs.   Physical Exam: BP 128/72   Pulse 65   Ht 5\' 8"  (1.727 m)   Wt 165 lb (74.8 kg)   BMI 25.09 kg/m   Constitutional:  Alert and oriented, No acute distress. HEENT: [place] AT, moist mucus membranes.  Trachea midline, no masses. Cardiovascular: No clubbing, cyanosis, or edema. Respiratory: Normal respiratory effort, no increased work of breathing. GU: Asymmetric prostate L >R however consistency normal and uniform throughout without abnormal firmness, nodules or induration Skin: No rashes, bruises or suspicious lesions. Psychiatric: Normal mood and affect.   Assessment & Plan:   76 y.o. male with recent PSA above baseline.  We discussed that abnormal PSA velocity is based on 3 successive readings and this could be a transient elevation Rx silodosin 8 mg daily x30 days Follow-up lab visit 1 month for repeat PSA If he does have an abnormal PSA velocity would recommend prostate MRI to evaluate for evidence of high risk disease   Riki Altes, MD  [facility] [address], [address] [place], [place] [address] [phone]

## 2021-08-15 ENCOUNTER — Telehealth: Payer: Self-pay

## 2021-08-15 NOTE — Telephone Encounter (Signed)
Called and spoke to patient in regards to upcoming Covid test. Patient had a clear understanding. Nothing further needed.  ?

## 2021-08-18 ENCOUNTER — Other Ambulatory Visit: Payer: Self-pay

## 2021-08-18 ENCOUNTER — Other Ambulatory Visit
Admission: RE | Admit: 2021-08-18 | Discharge: 2021-08-18 | Disposition: A | Discharge status: Home / Self Care | Payer: PPO | Source: Ambulatory Visit | Attending: Pulmonary Disease | Admitting: Pulmonary Disease

## 2021-08-18 DIAGNOSIS — Z20822 Contact with and (suspected) exposure to covid-19: Secondary | ICD-10-CM | POA: Insufficient documentation

## 2021-08-18 DIAGNOSIS — Z01812 Encounter for preprocedural laboratory examination: Secondary | ICD-10-CM | POA: Diagnosis not present

## 2021-08-18 LAB — SARS CORONAVIRUS 2 (TAT 6-24 HRS): SARS Coronavirus 2: NEGATIVE

## 2021-08-19 ENCOUNTER — Ambulatory Visit: Payer: PPO | Attending: Pulmonary Disease

## 2021-08-19 DIAGNOSIS — J449 Chronic obstructive pulmonary disease, unspecified: Secondary | ICD-10-CM | POA: Insufficient documentation

## 2021-08-19 DIAGNOSIS — J984 Other disorders of lung: Secondary | ICD-10-CM | POA: Diagnosis not present

## 2021-08-19 DIAGNOSIS — F17210 Nicotine dependence, cigarettes, uncomplicated: Secondary | ICD-10-CM | POA: Diagnosis not present

## 2021-08-19 MED ORDER — ALBUTEROL SULFATE (2.5 MG/3ML) 0.083% IN NEBU
2.5000 mg | INHALATION_SOLUTION | Freq: Once | RESPIRATORY_TRACT | Status: AC
  Administered 2021-08-19: 2.5 mg via RESPIRATORY_TRACT
  Filled 2021-08-19: qty 3

## 2021-08-22 ENCOUNTER — Ambulatory Visit (INDEPENDENT_AMBULATORY_CARE_PROVIDER_SITE_OTHER): Payer: PPO | Admitting: Family Medicine

## 2021-08-22 ENCOUNTER — Other Ambulatory Visit: Payer: Self-pay

## 2021-08-22 DIAGNOSIS — Z87891 Personal history of nicotine dependence: Secondary | ICD-10-CM

## 2021-08-22 DIAGNOSIS — R972 Elevated prostate specific antigen [PSA]: Secondary | ICD-10-CM

## 2021-08-22 NOTE — Progress Notes (Signed)
The patient came in for this visit and noted he had nothing he needed to talk about.  He noted he had no pain.  I just recently saw him and at that visit he was advised to follow-up in 6 months.  This visit was canceled and the [place] made sure he had follow-up scheduled for 6 months after his most recent visit. ?

## 2021-08-26 ENCOUNTER — Telehealth: Payer: Self-pay | Admitting: Pulmonary Disease

## 2021-08-26 MED ORDER — AZITHROMYCIN 250 MG PO TABS: ORAL_TABLET | ORAL | 0 refills | Status: DC

## 2021-08-26 MED ORDER — PREDNISONE 10 MG PO TABS: ORAL_TABLET | ORAL | 0 refills | Status: DC

## 2021-08-26 NOTE — Telephone Encounter (Signed)
PFTs showed moderate obstructive lung disease consistent with COPD. I will send in zpack and prednisone taper for his acute symptoms. Cont Anoro and use albuterol every 4-6 hours for sob.  ?

## 2021-08-26 NOTE — Telephone Encounter (Signed)
Spoke to patient.  ?He stated that he had PFT 08/19/2021.  ?08/23/2021 he developed temp of 100, chest/head congestion, chills, sob with exertion, dry cough and wheezing.  ?Negative covid test 08/18/2021. ?Using Anoro once daily. He is not using albuterol.  ?He would also like PFT results. ? ? ?Beth, please advise. Dr. Patsey Berthold is unavailable  ?

## 2021-08-26 NOTE — Telephone Encounter (Signed)
Patient is aware of recommendations and voiced his understanding.  Nothing further needed. 

## 2021-08-29 ENCOUNTER — Ambulatory Visit: Payer: PPO | Admitting: Pulmonary Disease

## 2021-08-29 ENCOUNTER — Encounter: Payer: Self-pay | Admitting: Pulmonary Disease

## 2021-08-29 ENCOUNTER — Other Ambulatory Visit: Payer: Self-pay

## 2021-08-29 VITALS — BP 102/80 | HR 58 | Temp 98.0°F | Ht 68.0 in | Wt 170.6 lb

## 2021-08-29 DIAGNOSIS — R0602 Shortness of breath: Secondary | ICD-10-CM

## 2021-08-29 DIAGNOSIS — F17210 Nicotine dependence, cigarettes, uncomplicated: Secondary | ICD-10-CM

## 2021-08-29 DIAGNOSIS — R052 Subacute cough: Secondary | ICD-10-CM | POA: Diagnosis not present

## 2021-08-29 DIAGNOSIS — J449 Chronic obstructive pulmonary disease, unspecified: Secondary | ICD-10-CM

## 2021-08-29 MED ORDER — TRELEGY ELLIPTA 200-62.5-25 MCG/ACT IN AEPB: 100.0000 ug | INHALATION_SPRAY | Freq: Every day | RESPIRATORY_TRACT | 0 refills | Status: DC

## 2021-08-29 MED ORDER — TRELEGY ELLIPTA 100-62.5-25 MCG/ACT IN AEPB: 1.0000 | INHALATION_SPRAY | Freq: Every day | RESPIRATORY_TRACT | 11 refills | Status: DC

## 2021-08-29 MED ORDER — TRELEGY ELLIPTA 100-62.5-25 MCG/ACT IN AEPB: 100.0000 ug | INHALATION_SPRAY | Freq: Every day | RESPIRATORY_TRACT | 0 refills | Status: DC

## 2021-08-29 NOTE — Patient Instructions (Signed)
Keep your appointment for your lung cancer screening scan on 4 April. ? ?We are switching your inhaler to Trelegy Ellipta 100 this will replace the Anoro.  This is 1 puff daily.  Make sure you rinse your mouth well after you use it. ? ?DO NOT USE ANORO ANY MORE.  If you took Anoro today start your Trelegy tomorrow at the same time. ? ?We will see you in follow-up in 3 months time call sooner should any new problems arise. ?

## 2021-08-29 NOTE — Progress Notes (Signed)
? ?Subjective:  ? ? Patient ID: Karl Myers, male    DOB: 17-Apr-1946, 76 y.o.   MRN: [id number] ?Patient Care Team: ?Leone Haven, MD as PCP - General (Family Medicine) ?Minna Merritts, MD as PCP - Cardiology (Cardiology) ? ?Chief Complaint  ?Patient presents with  ? Follow-up  ? ?HPI ?Patient is a 76 year old current smoker (4 cigarettes/day, 5 PY) who presents for follow-up today.  Initial evaluation here was on 01 July 2021.  He had PFTs performed 19 August 2021.  This showed moderate obstructive defect with minimal to no reversibility, small airways component and overall normal diffusion capacity.  He had a prednisone taper pack and azithromycin sent on 21 March due to a minor exacerbation.  He states that this has helped him.  He is completing his course. ? ?Since his sick call on 21 March he has been afebrile.  Cough markedly better although he did go and work in his yard yesterday and cough has returned.  This is highly suggestive of an allergic component.  As he is completing prednisone allergen panel will have to be deferred until he is off prednisone altogether. ? ?Cough has been nonproductive, note is made that he is on ACE inhibitor.  No hemoptysis.  No chest pain, paroxysmal nocturnal dyspnea orthopnea.  No lower extremity edema or calf tenderness. ? ? ?DATA ?08/19/2021 PFTs: Patient had difficulty performing the test and ATS standards not met however effort independent parameters were preserved.  FEV1 1.61 L or 57% predicted, FVC 2.57 L or 65% predicted, FEV1/FVC 63%, postbronchodilator only 9% change in FEV1.  Lung volumes and airway resistance numbers were not clearly reproducible.  Diffusion capacity normal.  Consistent with moderate COPD. ? ? ? ?Review of Systems ?A 10 point review of systems was performed and it is as noted above otherwise negative. ? ?Patient Active Problem List  ? Diagnosis Date Noted  ? Right knee pain 07/07/2021  ? History of melanoma 10/13/2019  ? Atrial  fibrillation ([place]) 08/01/2019  ? Low TSH level 08/01/2019  ? COPD (chronic obstructive pulmonary disease) ([place])   ? Anxiety   ? 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 Family history of prostate cancer in father 06/17/2018  ? CAD (coronary artery disease) 07/27/2017  ? Tobacco abuse 06/24/2017  ? Hypertension 03/12/2017  ? Hyperlipidemia 03/12/2017  ? Sleeping difficulty 03/12/2017  ? Cervical radiculopathy 03/12/2017  ? ?Social History  ? ?Tobacco Use  ? Smoking status: Every Day  ?  Packs/day: 1.00  ?  Years: 60.00  ?  Pack years: 60.00  ?  Types: Cigars, Cigarettes  ? Smokeless tobacco: Never  ? Tobacco comments:  ?  Patient smokes cigars occasionally  ?  Currently smoke 4-5 cigs daily - 08/29/21  ?Substance Use Topics  ? Alcohol use: No  ? ?Allergies  ?Allergen Reactions  ? Hydrocodone-Acetaminophen Other (See Comments)  ?  With large quantities "hyper"  ? Neurontin [Gabapentin] Other (See Comments)  ?  With large quantities "hyper" ?  ? ?Current Meds  ?Medication Sig  ? ALPRAZolam (XANAX) 0.5 MG tablet TAKE ONE-HALF TABLET BY MOUTH EVERY MORNING AND TAKE ONE TABLET BY MOUTH AT BEDTIME AS DIRECTED  ? apixaban (ELIQUIS) 5 MG TABS tablet Take 1 tablet (5 mg total) by mouth 2 (two) times daily.  ? atenolol (TENORMIN) 25 MG tablet TAKE ONE TABLET AT BEDTIME  ? atorvastatin (LIPITOR) 80 MG tablet TAKE 1 TABLET BY MOUTH DAILY.  ? gabapentin (NEURONTIN) 100 MG  capsule Take 1 capsule (100 mg total) by mouth 3 (three) times daily.  ? lisinopril (ZESTRIL) 10 MG tablet TAKE ONE TABLET AT BEDTIME  ? nitroGLYCERIN (NITROSTAT) 0.4 MG SL tablet Place 1 tablet (0.4 mg total) under the tongue every 5 (five) minutes as needed for chest pain. For up to 3 doses per episode.  ? predniSONE (DELTASONE) 10 MG tablet 4 tabs for 2 days, then 3 tabs for 2 days, 2 tabs for 2 days, then 1 tab for 2 days, then stop  ? silodosin (RAPAFLO) 8 MG CAPS capsule Take 1 capsule (8 mg total) by mouth daily with breakfast.  ?  Spacer/Aero-Holding Chambers (AEROCHAMBER PLUS) inhaler Use with inhaler  ? traZODone (DESYREL) 150 MG tablet TAKE ONE TABLET AT BEDTIME  ? umeclidinium-vilanterol (ANORO ELLIPTA) 62.5-25 MCG/ACT AEPB Inhale 1 puff into the lungs daily.  ? varenicline (CHANTIX CONTINUING MONTH PAK) 1 MG tablet Take 1 tablet (1 mg total) by mouth 2 (two) times daily.  ? Varenicline Tartrate, Starter, (CHANTIX STARTING MONTH PAK) 0.5 MG X 11 & 1 MG X 42 TBPK Take 1 tablet by mouth as directed. Follow package instructions  ? [DISCONTINUED] azithromycin (ZITHROMAX) 250 MG tablet Take 2 tabs today; then 1 tab daily x 4 days  ? ?Immunization History  ?Administered Date(s) Administered  ? Fluad Quad(high Dose 65+) 02/20/2019, 03/07/2020, 04/21/2021  ? Influenza, High Dose Seasonal PF 03/12/2017, 03/31/2018  ? Influenza-Unspecified 02/20/2019  ? PFIZER(Purple Top)SARS-COV-2 Vaccination 10/27/2019, 12/01/2019  ? Zoster Recombinat (Shingrix) 12/03/2020  ? ?   ?Objective:  ? Physical Exam ?BP 102/80 (BP Location: Left Arm, Patient Position: Sitting, Cuff Size: Normal)   Pulse (!) 58   Temp 98 ?F (36.7 ?C) (Oral)   Ht '5\' 8"'  (1.727 m)   Wt 170 lb 9.6 oz (77.4 kg)   SpO2 95%   BMI 25.94 kg/m?  ? ?GENERAL: Well, well-nourished gentleman, no acute distress, fully ambulatory, well groomed.  No conversational dyspnea. ?HEAD: Normocephalic, atraumatic.  ?EYES: Pupils equal, round, reactive to light.  No scleral icterus.  ?MOUTH: Nose/mouth/throat not examined due to masking requirements for COVID 19. ?NECK: Supple. No thyromegaly. Trachea midline. No JVD.  No adenopathy. ?PULMONARY: Good air entry bilaterally.  Coarse, otherwise, no adventitious sounds. ?CARDIOVASCULAR: S1 and S2. Regular rate and rhythm.  No rubs, murmurs or gallops heard. ?ABDOMEN: Benign. ?MUSCULOSKELETAL: No joint deformity, no clubbing, no edema.  ?NEUROLOGIC: No focal deficit, no gait disturbance, speech is fluent. ?SKIN: Intact,warm,dry. ?PSYCH: Mood and behavior  normal. ?   ?Assessment & Plan:  ? ?  ICD-10-CM   ?1. Stage 2 moderate COPD by GOLD classification ([place])  J44.9   ? Has some airway reversibility and an allergic component ?Switch Anoro to Trelegy 100, 1 puff daily ?Continue as needed albuterol ?STOP smoking  ?  ?2. Shortness of breath  R06.02   ? Worsened with change in season ?Better after prednisone taper  ?  ?3. Subacute cough  R05.2   ? Started with change in season and working in the garden ?Right allergic component ?He is on ACE inhibitor recommend switch to ARB  ?  ?4. Tobacco dependence due to cigarettes  F17.210   ? Continues to decrease tobacco use ?Advised to discontinue altogether ?Enrolled in lung cancer screening ?Lung cancer screening CT 4/4  ?  ? ?Meds ordered this encounter  ?Medications  ? Fluticasone-Umeclidin-Vilant (TRELEGY ELLIPTA) 200-62.5-25 MCG/ACT AEPB  ?  Sig: Inhale 100 mcg into the lungs daily.  ?  Dispense:  28  each  ?  Refill:  0  ?  Order Specific Question:   Lot Number?  ?  Answer:   [id number]  ?  Order Specific Question:   Expiration Date?  ?  Answer:   03/08/2023  ?  Order Specific Question:   Quantity  ?  Answer:   1  ? Fluticasone-Umeclidin-Vilant (TRELEGY ELLIPTA) 100-62.5-25 MCG/ACT AEPB  ?  Sig: Inhale 1 puff into the lungs daily.  ?  Dispense:  28 each  ?  Refill:  11  ? ?Patient is completing a prednisone and azithromycin course after a sick call on 21 March.  Overall he appears to be driven perhaps by an allergic component.  As he is currently finishing prednisone will not be able to test until he is off for at least a week.  Recommend switching to Trelegy Ellipta 100 to add anti-inflammatory component.  He is to discontinue Anoro.  Because of his tendency towards dry cough I would recommend that ACE inhibitor be switched to ARB if possible. ? ?Patient is scheduled to do his first low-dose lung cancer screening CT on 4 April, we will review that CT when available. ? ?We will see the patient in follow-up in 3 months time he is to  contact us prior to that time should any new difficulties arise. ? ? ?C. Derrill Kay, MD ?Advanced Bronchoscopy ?PCCM  [facility] ? ?*This note was dictated using voice recognition software/Dragon.  Despite bes

## 2021-09-01 ENCOUNTER — Other Ambulatory Visit: Payer: PPO

## 2021-09-01 ENCOUNTER — Other Ambulatory Visit: Payer: Self-pay

## 2021-09-01 DIAGNOSIS — R972 Elevated prostate specific antigen [PSA]: Secondary | ICD-10-CM | POA: Diagnosis not present

## 2021-09-02 LAB — PSA: Prostate Specific Ag, Serum: 0.7 ng/mL (ref 0.0–4.0)

## 2021-09-03 ENCOUNTER — Telehealth: Payer: Self-pay | Admitting: *Deleted

## 2021-09-03 NOTE — Telephone Encounter (Signed)
Notified patient as instructed,.  

## 2021-09-03 NOTE — Telephone Encounter (Signed)
-----   Message from Abbie Sons, MD sent at 09/03/2021  7:23 AM EDT ----- ?Follow-up PSA back to baseline at 0.7.  Continue follow-up with Dr. Caryl Bis and can see back here prn ?

## 2021-09-05 ENCOUNTER — Encounter: Payer: Self-pay | Admitting: Acute Care

## 2021-09-05 ENCOUNTER — Ambulatory Visit (INDEPENDENT_AMBULATORY_CARE_PROVIDER_SITE_OTHER): Payer: PPO | Admitting: Acute Care

## 2021-09-05 DIAGNOSIS — F17210 Nicotine dependence, cigarettes, uncomplicated: Secondary | ICD-10-CM

## 2021-09-05 NOTE — Progress Notes (Signed)
Virtual Visit via Telephone Note ? ?I connected with Karl Myers on 04/22/21 at  2:00 PM EST by telephone and verified that I am speaking with the correct person using two identifiers. ? ?Location: ?Patient: Home ?Provider: Working from home ?  ?I discussed the limitations, risks, security and privacy concerns of performing an evaluation and management service by telephone and the availability of in person appointments. I also discussed with the patient that there may be a patient responsible charge related to this service. The patient expressed understanding and agreed to proceed. ? ?Shared Decision Making Visit [place] Cancer Screening Program ?([phone]) ? ? ?Eligibility: ?Age 76 y.o. ?Pack Years Smoking History Calculation 61 ?(# packs/per year x # years smoked) ?Recent History of coughing up blood  no ?Unexplained weight loss? no ?( >Than 15 pounds within the last 6 months ) ?Prior History Myers / other cancer no ?(Diagnosis within the last 5 years already requiring surveillance chest CT Scans). ?Smoking Status Current Smoker ?Former Smokers: Years since quit: NA ? Quit Date: NA ? ?Visit Components: ?Discussion included one or more decision making aids. yes ?Discussion included risk/benefits of screening. yes ?Discussion included potential follow up diagnostic testing for abnormal scans. yes ?Discussion included meaning and risk of over diagnosis. yes ?Discussion included meaning and risk of False Positives. yes ?Discussion included meaning of total radiation exposure. yes ? ?Counseling Included: ?Importance of adherence to annual Myers cancer LDCT screening. yes ?Impact of comorbidities on ability to participate in the program. yes ?Ability and willingness to under diagnostic treatment. yes ? ?Smoking Cessation Counseling: ?Current Smokers:  ?Discussed importance of smoking cessation. yes ?Information about tobacco cessation classes and interventions provided to patient. yes ?Patient provided with "ticket" for  LDCT Scan. yes ?Symptomatic Patient. yes ? Counseling(Intermediate counseling: > three minutes) [id number] ?Diagnosis Code: Tobacco Use Z72.0 ?Asymptomatic Patient NA ? Counseling NA ?Former Smokers:  ?Discussed the importance of maintaining cigarette abstinence. yes ?Diagnosis Code: Personal History of Nicotine Dependence. V25.366 ?Information about tobacco cessation classes and interventions provided to patient. Yes ?Patient provided with "ticket" for LDCT Scan. yes ?Written Order for [facility] Cancer Screening with LDCT placed in Epic. Yes ?(CT Chest [facility] Cancer Screening Low Dose W/O CM) [id number] ?Z12.2-Screening of respiratory organs ?Z87.891-Personal history of nicotine dependence ? ? ?I spent 25 minutes of face to face time with him discussing the risks and benefits of Myers cancer screening. We viewed a power point together that explained in detail the above noted topics. We took the time to pause the power point at intervals to allow for questions to be asked and answered to ensure understanding. We discussed that he had taken the single most powerful action possible to decrease his risk of developing Myers cancer when he quit smoking. I counseled him to remain smoke free, and to contact me if he ever had the desire to smoke again so that I can provide resources and tools to help support the effort to remain smoke free. We discussed the time and location of the scan, and that either  Doroteo Glassman RN or I will call with the results within  24-48 hours of receiving them. he has my card and contact information in the event He needs to speak with me, in addition to a copy of the power point we reviewed as a resource. He verbalized understanding of all of the above and had no further questions upon leaving the office.  ? ? ? ?I explained to the patient that there has been a  high incidence of coronary artery disease noted on these exams. I explained that this is a non-gated exam therefore degree or severity cannot be  determined. This patient is on statin therapy. I have asked the patient to follow-up with their PCP regarding any incidental finding of coronary artery disease and management with diet or medication as they feel is clinically indicated. The patient verbalized understanding of the above and had no further questions. ? ?I spent 3 minutes counseling on smoking cessation and the health risks of continued tobacco abuse  ? ? ?Karl Ruddy D. Harris, NP-C ?[facility] ?Personal contact information can be found on Amion  ?09/05/2021, 9:18 AM ? ? ? ? ? ? ? ? ? ?

## 2021-09-05 NOTE — Patient Instructions (Signed)
Thank you for participating in the [facility] Scre[facility]. It was our pleasure to meet you today. We will call you with the results of your scan within the next few days. Your scan will be assigned a Lung RADS category score by the physicians reading the scans.  This Lung RADS score determines follow up scanning.  See below for description of categories, and follow up screening recommendations. We will be in touch to schedule your follow up screening annually or based on recommendations of our providers. We will fax a copy of your scan results to your Primary Care Physician, or the physician who referred you to the program, to ensure they have the results. Please call the office if you have any questions or concerns regarding your scanning experience or results.  Our office number is [phone]. Please speak with Denise Phelps, RN. , or  Denise Buckner RN, They are  our Lung Cancer Screening RN.'s If They are unavailable when you call, Please leave a message on the voice mail. We will return your call at our earliest convenience.This voice mail is monitored several times a day.  Remember, if your scan is normal, we will scan you annually as long as you continue to meet the criteria for the program. (Age 55-77, Current smoker or smoker who has quit within the last 15 years). If you are a smoker, remember, quitting is the single most powerful action that you can take to decrease your risk of lung cancer and other pulmonary, breathing related problems. We know quitting is hard, and we are here to help.  Please let us know if there is anything we can do to help you meet your goal of quitting. If you are a former smoker, congratulations. We are proud of you! Remain smoke free! Remember you can refer friends or family members through the number above.  We will screen them to make sure they meet criteria for the program. Thank you for helping us take better care of you by  participating in Lung Screening.  You can receive free nicotine replacement therapy ( patches, gum or mints) by calling 1-800-QUIT NOW. Please call so we can get you on the path to becoming  a non-smoker. I know it is hard, but you can do this!  Lung RADS Categories:  Lung RADS 1: no nodules or definitely non-concerning nodules.  Recommendation is for a repeat annual scan in 12 months.  Lung RADS 2:  nodules that are non-concerning in appearance and behavior with a very low likelihood of becoming an active cancer. Recommendation is for a repeat annual scan in 12 months.  Lung RADS 3: nodules that are probably non-concerning , includes nodules with a low likelihood of becoming an active cancer.  Recommendation is for a 6-month repeat screening scan. Often noted after an upper respiratory illness. We will be in touch to make sure you have no questions, and to schedule your 6-month scan.  Lung RADS 4 A: nodules with concerning findings, recommendation is most often for a follow up scan in 3 months or additional testing based on our provider's assessment of the scan. We will be in touch to make sure you have no questions and to schedule the recommended 3 month follow up scan.  Lung RADS 4 B:  indicates findings that are concerning. We will be in touch with you to schedule additional diagnostic testing based on our provider's  assessment of the scan.  Other options for assistance in smoking cessation (   As covered by your insurance benefits)  Hypnosis for smoking cessation  [employer] [phone]  Acupuncture for smoking cessation  [facility] [phone]   

## 2021-09-07 ENCOUNTER — Other Ambulatory Visit: Payer: Self-pay | Admitting: Family Medicine

## 2021-09-07 DIAGNOSIS — E785 Hyperlipidemia, unspecified: Secondary | ICD-10-CM

## 2021-09-09 ENCOUNTER — Ambulatory Visit: Payer: PPO | Admitting: Dermatology

## 2021-09-09 ENCOUNTER — Ambulatory Visit
Admission: RE | Admit: 2021-09-09 | Discharge: 2021-09-09 | Disposition: A | Discharge status: Home / Self Care | Payer: PPO | Source: Ambulatory Visit | Attending: Acute Care | Admitting: Acute Care

## 2021-09-09 ENCOUNTER — Other Ambulatory Visit: Payer: Self-pay

## 2021-09-09 DIAGNOSIS — Z87891 Personal history of nicotine dependence: Secondary | ICD-10-CM | POA: Insufficient documentation

## 2021-09-11 ENCOUNTER — Telehealth: Payer: Self-pay | Admitting: Family Medicine

## 2021-09-11 NOTE — Telephone Encounter (Signed)
Patient called and wanted to know if Dr Caryl Bis has reviewed his CT Chest lung cancer screening test. ?

## 2021-09-12 ENCOUNTER — Telehealth: Payer: Self-pay | Admitting: Acute Care

## 2021-09-12 DIAGNOSIS — F17210 Nicotine dependence, cigarettes, uncomplicated: Secondary | ICD-10-CM

## 2021-09-12 DIAGNOSIS — Z87891 Personal history of nicotine dependence: Secondary | ICD-10-CM

## 2021-09-12 DIAGNOSIS — Z122 Encounter for screening for malignant neoplasm of respiratory organs: Secondary | ICD-10-CM

## 2021-09-12 NOTE — Telephone Encounter (Signed)
Called and spoke with pt and reviewed Lung screening CT results. I explained to pt that we will plan to repeat his CT in 6 months to f/u on the nodules that were seen . Pt verbalized understanding and had no further questions. Results faxed to PCP. Order placed for 6 mth nodule f/u CT.  ?

## 2021-10-09 ENCOUNTER — Other Ambulatory Visit: Payer: Self-pay | Admitting: Cardiovascular Disease

## 2021-10-09 DIAGNOSIS — I480 Paroxysmal atrial fibrillation: Secondary | ICD-10-CM

## 2021-10-10 NOTE — Telephone Encounter (Signed)
Prescription refill request for Eliquis received. ?Indication:Afib ?Last office visit:10/22 ?Scr:0.9 ?Age: 76 ?Weight:77.4 kg ? ?Prescription refilled ? ?

## 2021-10-17 DIAGNOSIS — H2640 Unspecified secondary cataract: Secondary | ICD-10-CM | POA: Diagnosis not present

## 2021-10-17 DIAGNOSIS — H33192 Other retinoschisis and retinal cysts, left eye: Secondary | ICD-10-CM | POA: Diagnosis not present

## 2021-10-31 DIAGNOSIS — H26491 Other secondary cataract, right eye: Secondary | ICD-10-CM | POA: Diagnosis not present

## 2021-11-04 ENCOUNTER — Other Ambulatory Visit: Payer: Self-pay | Admitting: Family Medicine

## 2021-11-04 DIAGNOSIS — G479 Sleep disorder, unspecified: Secondary | ICD-10-CM

## 2021-11-12 DIAGNOSIS — H2000 Unspecified acute and subacute iridocyclitis: Secondary | ICD-10-CM | POA: Diagnosis not present

## 2021-11-20 DIAGNOSIS — H2000 Unspecified acute and subacute iridocyclitis: Secondary | ICD-10-CM | POA: Diagnosis not present

## 2021-11-20 DIAGNOSIS — M3501 Sicca syndrome with keratoconjunctivitis: Secondary | ICD-10-CM | POA: Diagnosis not present

## 2021-12-03 ENCOUNTER — Encounter: Payer: Self-pay | Admitting: Pulmonary Disease

## 2021-12-03 ENCOUNTER — Ambulatory Visit: Payer: PPO | Admitting: Pulmonary Disease

## 2021-12-03 VITALS — BP 120/62 | HR 54 | Temp 97.9°F | Ht 68.0 in | Wt 165.8 lb

## 2021-12-03 DIAGNOSIS — Z122 Encounter for screening for malignant neoplasm of respiratory organs: Secondary | ICD-10-CM

## 2021-12-03 DIAGNOSIS — F17210 Nicotine dependence, cigarettes, uncomplicated: Secondary | ICD-10-CM

## 2021-12-03 DIAGNOSIS — J449 Chronic obstructive pulmonary disease, unspecified: Secondary | ICD-10-CM

## 2021-12-03 DIAGNOSIS — R0602 Shortness of breath: Secondary | ICD-10-CM

## 2021-12-03 NOTE — Progress Notes (Signed)
Subjective:    Patient ID: Karl Myers, male    DOB: [birth year], 76 y.o.   MRN: [id number] Patient Care Team: Leone Haven, MD as PCP - General (Family Medicine) Minna Merritts, MD as PCP - Cardiology (Cardiology)  Chief Complaint  Patient presents with   Follow-up    C/o SOB with exertion and mild dry cough.   HPI Patient is a 76 year old current smoker (half PPD, 29 PY) who presents for follow-up on the issue of moderate COPD.  This is a follow-up from his initial visit here from 01 July 2021.  At that time the patient was given a trial of Anoro Ellipta however he was switched to Trelegy due to insurance coverage.  Patient has been using the Trelegy however as needed and not daily as prescribed.  As noted his PFTs showed moderate obstructive defect.  There was some mild concomitant restrictive defect but this could have been due to to poor effort as the patient did have difficulty with the test.  Low-dose chest CT was a RADS 3S and will need repeat in 6 months from his April scan.  The patient was given Chantix to try for smoking cessation on his last visit however he did not tolerate this and it was not effective for smoking cessation.  He is currently not interested in quitting.  He states his shortness of breath issues have resolved.  He was offered an albuterol inhaler as a rescue however he declines.  He has not had any fevers, chills or sweats.  Cough is usually in the mornings productive of whitish sputum.  No hemoptysis.  No weight loss or anorexia.  No orthopnea, paroxysmal nocturnal dyspnea or lower extremity edema.  Patient does not endorse any other complaint.  Overall he feels well and looks well.  Still stays active gardening and doing yard work.  DATA 08/19/2021 PFTs: FEV1 1.61 L or 57% predicted, FVC 2.57 L or 65% predicted, FEV1/FVC 63%, mild concomitant restrictive defect, very mild diffusion defect.  Consistent with moderate obstructive defect on the basis of  COPD, concomitant restrictive defect may be effort driven as the patient did have difficulty performing the test other possibilities include smoking-related interstitial lung disease. 09/09/2021 chest LDCT: Centrilobular emphysema, linear opacity in the lingula due to scarring or atelectasis, numerous solid pulmonary nodules bilaterally largest located in right lower lobe measuring 7.2 mm, lungs RADS 3S, follow-up in 6 months recommended.  Review of Systems A 10 point review of systems was performed and it is as noted above otherwise negative.  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   Family history of prostate cancer in father 06/17/2018   CAD (coronary artery disease) 07/27/2017   Tobacco abuse 06/24/2017   Hypertension 03/12/2017   Hyperlipidemia 03/12/2017   Sleeping difficulty 03/12/2017   Cervical radiculopathy 03/12/2017   Social History   Tobacco Use   Smoking status: Every Day    Packs/day: 1.00    Years: 61.00    Total pack years: 61.00    Types: Cigars, Cigarettes   Smokeless tobacco: Never   Tobacco comments:    0.5PPD 12/03/2021  Substance Use Topics   Alcohol use: No   Allergies  Allergen Reactions  Hydrocodone-Acetaminophen Other (See Comments)    With large quantities "hyper"   Neurontin [Gabapentin] Other (See Comments)    With large quantities "hyper"    Current Meds  Medication Sig   ALPRAZolam (XANAX) 0.5 MG tablet TAKE 1/2 TABLET BY MOUTH EVERY MORNING AND TAKE 1 TABLET BY MOUTH AT BEDTIME ASDIRECTED   atenolol (TENORMIN) 25 MG tablet TAKE ONE TABLET AT BEDTIME   atorvastatin (LIPITOR) 80 MG tablet TAKE 1 TABLET BY MOUTH DAILY.   ELIQUIS 5 MG TABS tablet TAKE ONE TABLET BY MOUTH TWICE DAILY    Fluticasone-Umeclidin-Vilant (TRELEGY ELLIPTA) 100-62.5-25 MCG/ACT AEPB Inhale 1 puff into the lungs daily.   gabapentin (NEURONTIN) 100 MG capsule Take 1 capsule (100 mg total) by mouth 3 (three) times daily.   lisinopril (ZESTRIL) 10 MG tablet TAKE ONE TABLET AT BEDTIME   nitroGLYCERIN (NITROSTAT) 0.4 MG SL tablet Place 1 tablet (0.4 mg total) under the tongue every 5 (five) minutes as needed for chest pain. For up to 3 doses per episode.   silodosin (RAPAFLO) 8 MG CAPS capsule Take 1 capsule (8 mg total) by mouth daily with breakfast.   Spacer/Aero-Holding Chambers (AEROCHAMBER PLUS) inhaler Use with inhaler   traZODone (DESYREL) 150 MG tablet TAKE ONE TABLET AT BEDTIME   [DISCONTINUED] Fluticasone-Umeclidin-Vilant (TRELEGY ELLIPTA) 100-62.5-25 MCG/ACT AEPB Inhale 100 mcg into the lungs daily.   [DISCONTINUED] predniSONE (DELTASONE) 10 MG tablet 4 tabs for 2 days, then 3 tabs for 2 days, 2 tabs for 2 days, then 1 tab for 2 days, then stop   Immunization History  Administered Date(s) Administered   Fluad Quad(high Dose 65+) 02/20/2019, 03/07/2020, 04/21/2021   Influenza, High Dose Seasonal PF 03/12/2017, 03/31/2018   Influenza-Unspecified 02/20/2019   PFIZER(Purple Top)SARS-COV-2 Vaccination 10/27/2019, 12/01/2019   Zoster Recombinat (Shingrix) 12/03/2020       Objective:   Physical Exam BP 120/62 (BP Location: Left Arm, Cuff Size: Normal)   Pulse (!) 54   Temp 97.9 F (36.6 C) (Temporal)   Ht '5\' 8"'$  (1.727 m)   Wt 165 lb 12.8 oz (75.2 kg)   SpO2 96%   BMI 25.21 kg/m  GENERAL: Well, well-nourished gentleman, no acute distress, fully ambulatory.  No conversational dyspnea. HEAD: Normocephalic, atraumatic.  EYES: Pupils equal, round, reactive to light.  No scleral icterus.  MOUTH: Dentures uppers and lowers, oral mucosa moist.  No thrush. NECK: Supple. No thyromegaly. Trachea midline. No JVD.  No adenopathy. PULMONARY: Good air entry bilaterally.  Coarse, otherwise, no adventitious  sounds. CARDIOVASCULAR: S1 and S2. Regular rate and rhythm.  No rubs, murmurs or gallops heard. ABDOMEN: Benign. MUSCULOSKELETAL: No joint deformity, no clubbing, no edema.  NEUROLOGIC: No focal deficit, no gait disturbance, speech is fluent. SKIN: Intact,warm,dry. PSYCH: Mood and behavior normal.  Representative image of 09 September 2021 chest LDCT:     Assessment & Plan:     ICD-10-CM   1. Stage 2 moderate COPD by GOLD classification ([place])  J44.9    Continue Trelegy, patient reminded that this is to be used daily Patient declines rescue inhaler Follow-up 6 months time    2. Shortness of breath  R06.02    Markedly improved on Trelegy     3. Tobacco dependence due to cigarettes  F17.210    Counseled regards discontinuation of smoking Did not tolerate Chantix    4. Screening for malignant neoplasm of respiratory organ  Z12.2    LDCT RADS 3S Has 47-monthimaging scheduled     We will see the patient  in follow-up in 6 months time he is to contact us prior to that time should any new difficulties arise.  Renold Don, MD Advanced Bronchoscopy [facility]    *This note was dictated using voice recognition software/Dragon.  Despite best efforts to proofread, errors can occur which can change the meaning. Any transcriptional errors that result from this process are unintentional and may not be fully corrected at the time of dictation.

## 2021-12-03 NOTE — Patient Instructions (Signed)
Use your Trelegy daily.  You do have moderate COPD.  They will call you when your CT of the chest is due.  This is for the lung cancer screening.   We will follow-up in 6 months time call sooner should any new problems arise.

## 2021-12-08 ENCOUNTER — Other Ambulatory Visit: Payer: Self-pay | Admitting: Family Medicine

## 2022-01-05 ENCOUNTER — Telehealth: Payer: Self-pay

## 2022-01-05 ENCOUNTER — Ambulatory Visit (INDEPENDENT_AMBULATORY_CARE_PROVIDER_SITE_OTHER): Payer: PPO | Admitting: Family Medicine

## 2022-01-05 ENCOUNTER — Encounter: Payer: Self-pay | Admitting: Family Medicine

## 2022-01-05 VITALS — BP 130/80 | HR 57 | Temp 97.6°F | Ht 68.0 in | Wt 164.8 lb

## 2022-01-05 DIAGNOSIS — R413 Other amnesia: Secondary | ICD-10-CM | POA: Diagnosis not present

## 2022-01-05 DIAGNOSIS — R7303 Prediabetes: Secondary | ICD-10-CM | POA: Diagnosis not present

## 2022-01-05 DIAGNOSIS — R42 Dizziness and giddiness: Secondary | ICD-10-CM

## 2022-01-05 DIAGNOSIS — Z720 Tobacco use: Secondary | ICD-10-CM | POA: Diagnosis not present

## 2022-01-05 DIAGNOSIS — Z85820 Personal history of malignant melanoma of skin: Secondary | ICD-10-CM | POA: Diagnosis not present

## 2022-01-05 DIAGNOSIS — I10 Essential (primary) hypertension: Secondary | ICD-10-CM

## 2022-01-05 DIAGNOSIS — G479 Sleep disorder, unspecified: Secondary | ICD-10-CM

## 2022-01-05 LAB — HEMOGLOBIN A1C: Hgb A1c MFr Bld: 6.4 % (ref 4.6–6.5)

## 2022-01-05 LAB — COMPREHENSIVE METABOLIC PANEL
ALT: 15 U/L (ref 0–53)
AST: 19 U/L (ref 0–37)
Albumin: 4.5 g/dL (ref 3.5–5.2)
Alkaline Phosphatase: 80 U/L (ref 39–117)
BUN: 11 mg/dL (ref 6–23)
CO2: 29 mEq/L (ref 19–32)
Calcium: 9.2 mg/dL (ref 8.4–10.5)
Chloride: 102 mEq/L (ref 96–112)
Creatinine, Ser: 1.02 mg/dL (ref 0.40–1.50)
GFR: 71.47 mL/min (ref >60.00)
Glucose, Bld: 102 mg/dL — ABNORMAL HIGH (ref 70–99)
Potassium: 4.6 mEq/L (ref 3.5–5.1)
Sodium: 141 mEq/L (ref 135–145)
Total Bilirubin: 0.6 mg/dL (ref 0.2–1.2)
Total Protein: 6.7 g/dL (ref 6.0–8.3)

## 2022-01-05 LAB — CBC
HCT: 48.4 % (ref 39.0–52.0)
Hemoglobin: 16.3 g/dL (ref 13.0–17.0)
MCHC: 33.6 g/dL (ref 30.0–36.0)
MCV: 101.1 fl — ABNORMAL HIGH (ref 78.0–100.0)
Platelets: 192 10*3/uL (ref 150.0–400.0)
RBC: 4.79 Mil/uL (ref 4.22–5.81)
RDW: 16.3 % — ABNORMAL HIGH (ref 11.5–15.5)
WBC: 8.8 10*3/uL (ref 4.0–10.5)

## 2022-01-05 LAB — VITAMIN B12: Vitamin B-12: 355 pg/mL (ref 211–911)

## 2022-01-05 LAB — TSH: TSH: 0.68 u[IU]/mL (ref 0.35–5.50)

## 2022-01-05 MED ORDER — LISINOPRIL 5 MG PO TABS
5.0000 mg | ORAL_TABLET | Freq: Every day | ORAL | 3 refills | Status: DC
Start: 2022-01-05 — End: 2022-07-27

## 2022-01-05 NOTE — Telephone Encounter (Signed)
I called the patient back and informed him that he was on 10 mg of the lisinopril and the provider is taking the dose down to 5 mg and that a new Rx was sent to the pharmacy and he understood.  Cheyan Frees,cma

## 2022-01-05 NOTE — Progress Notes (Signed)
Tommi Rumps, MD Phone: [phone]  Karl Myers is a 76 y.o. male who presents today for f/u.  HYPERTENSION Disease Monitoring Home BP Monitoring 120/80 Chest pain- no    Dyspnea- no change to chronic dyspnea Medications Compliance-  taking lisinopril, atenolol. Lightheadedness- yes, see below  Edema- no BMET    Component Value Date/Time   NA 140 07/07/2021 0901   NA 137 05/06/2014 0751   K 4.6 07/07/2021 0901   K 4.1 05/06/2014 0751   CL 104 07/07/2021 0901   CL 103 05/06/2014 0751   CO2 29 07/07/2021 0901   CO2 28 05/06/2014 0751   GLUCOSE 108 (H) 07/07/2021 0901   GLUCOSE 121 (H) 05/06/2014 0751   BUN 13 07/07/2021 0901   BUN 10 05/06/2014 0751   CREATININE 0.95 07/07/2021 0901   CREATININE 1.00 06/17/2018 1439   CALCIUM 8.9 07/07/2021 0901   CALCIUM 8.6 05/06/2014 0751   GFRNONAA >60 05/23/2021 0616   GFRNONAA >60 05/06/2014 0751   GFRAA >60 07/27/2019 0259   GFRAA >60 05/06/2014 0751   Lightheadedness: Patient notes recently he has been feeling lightheaded when he stands up.  He feels as though he has to take a couple of steps forward and back to get his bearing.  He notes no syncope.  No vertigo.  No ear fullness.  He notes it can last for 20 to 30 minutes though it resolves if he sits down.  He notes no palpitations.  Sleep issues: Patient notes he sleeps well.  He goes to bed at 10 PM and gets up between 5 and 6 typically.  Oftentimes will have a nap for about an hour later in the evening.  He takes trazodone and Xanax nightly.  No drowsiness the next day.  Tobacco abuse: He is smoking half a pack a day.  He is not ready to quit smoking.  History of melanoma: Patient follows with dermatology every 6 months.  Memory difficulty: Patient notes over the last several months he has been forgetting stuff that he used to know.  He has not been getting lost while driving.  No ADL issues.  He scored 5/5 on the mini cog test.  Social History   Tobacco Use   Smoking Status Every Day   Packs/day: 1.00   Years: 61.00   Total pack years: 61.00   Types: Cigars, Cigarettes  Smokeless Tobacco Never  Tobacco Comments   0.5PPD 12/03/2021    Current Outpatient Medications on File Prior to Visit  Medication Sig Dispense Refill   ALPRAZolam (XANAX) 0.5 MG tablet TAKE 1/2 TABLET BY MOUTH EVERY MORNING AND TAKE 1 TABLET BY MOUTH AT BEDTIME ASDIRECTED 45 tablet 1   atenolol (TENORMIN) 25 MG tablet TAKE ONE TABLET BY MOUTH AT BEDTIME 90 tablet 3   atorvastatin (LIPITOR) 80 MG tablet TAKE 1 TABLET BY MOUTH DAILY. 90 tablet 1   ELIQUIS 5 MG TABS tablet TAKE ONE TABLET BY MOUTH TWICE DAILY 90 tablet 3   Fluticasone-Umeclidin-Vilant (TRELEGY ELLIPTA) 100-62.5-25 MCG/ACT AEPB Inhale 1 puff into the lungs daily. 28 each 11   gabapentin (NEURONTIN) 100 MG capsule TAKE 1 CAPSULE BY MOUTH 3 TIMES DAILY 270 capsule 1   nitroGLYCERIN (NITROSTAT) 0.4 MG SL tablet Place 1 tablet (0.4 mg total) under the tongue every 5 (five) minutes as needed for chest pain. For up to 3 doses per episode. 50 tablet 0   Spacer/Aero-Holding Chambers (AEROCHAMBER PLUS) inhaler Use with inhaler 1 each 2   traZODone (DESYREL) 150 MG  tablet TAKE ONE TABLET AT BEDTIME 90 tablet 1   No current facility-administered medications on file prior to visit.     ROS see history of present illness  Objective  Physical Exam Vitals:   01/05/22 0819  BP: 130/80  Pulse: (!) 57  Temp: 97.6 F (36.4 C)  SpO2: 95%   Lying blood pressure 150/81 pulse 55 Sitting blood pressure 146/82 pulse 53 Standing blood pressure 136/79 pulse 58  BP Readings from Last 3 Encounters:  01/05/22 130/80  12/03/21 120/62  08/29/21 102/80   Wt Readings from Last 3 Encounters:  01/05/22 164 lb 12.8 oz (74.8 kg)  12/03/21 165 lb 12.8 oz (75.2 kg)  08/29/21 170 lb 9.6 oz (77.4 kg)    Physical Exam Constitutional:      General: He is not in acute distress.    Appearance: He is not diaphoretic.  HENT:      Head:     Comments: Cerumen impaction bilaterally Cardiovascular:     Rate and Rhythm: Normal rate and regular rhythm.     Heart sounds: Normal heart sounds.  Pulmonary:     Effort: Pulmonary effort is normal.     Breath sounds: Normal breath sounds.  Skin:    General: Skin is warm and dry.  Neurological:     Mental Status: He is alert.    CMA attempted to irrigate patient's bilateral ears though he had discomfort so irrigation was abandoned without any cerumen being expelled from his ears  Assessment/Plan: Please see individual problem list.  Problem List Items Addressed This Visit     Hypertension (Chronic)    Adequately controlled.  His lightheadedness is likely orthostasis.  We will reduce his lisinopril to 5 mg daily and have him continue on atenolol 25 mg daily.  He will monitor his lightheadedness and blood pressures and let me know in 1 to 2 weeks if the lightheadedness is not improving with this change.  I will see him back in a month for follow-up on this issue.      Relevant Medications   lisinopril (ZESTRIL) 5 MG tablet   Other Relevant Orders   Comp Met (CMET)   CBC   Prediabetes (Chronic)    Check A1c.      Relevant Orders   HgB A1c   Sleeping difficulty (Chronic)    Stable.  He can continue trazodone 150 mg as needed for sleep at night and Xanax 1 tablet nightly as needed for sleep.      Tobacco abuse (Chronic)    I encouraged smoking cessation.  Patient is not ready to quit.  He understands the risk of continued smoking.  We will let me know if he changes his mind.      History of melanoma    He will continue to follow with dermatology.      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  He passed mini cog testing.  We will monitor.  Checking B12 and TSH to see if there could be some deficiency or abnormality there that could contribute to memory issue.      Relevant Orders   B12   TSH   Other Visit Diagnoses     Lightheadedness    -   Primary   Relevant Orders   Comp Met (CMET)   CBC        Return in about 1 month (around 02/05/2022) for Hypertension.  The patient did note that his wife who is also my  patient ended up in the emergency room after a fall.  He notes she has had some worsening tremor.  I encouraged him to have her contact us to schedule an appointment for follow-up.  Tommi Rumps, MD [place]

## 2022-01-05 NOTE — Assessment & Plan Note (Signed)
I encouraged smoking cessation.  Patient is not ready to quit.  He understands the risk of continued smoking.  We will let me know if he changes his mind.

## 2022-01-05 NOTE — Assessment & Plan Note (Signed)
Adequately controlled.  His lightheadedness is likely orthostasis.  We will reduce his lisinopril to 5 mg daily and have him continue on atenolol 25 mg daily.  He will monitor his lightheadedness and blood pressures and let me know in 1 to 2 weeks if the lightheadedness is not improving with this change.  I will see him back in a month for follow-up on this issue.

## 2022-01-05 NOTE — Assessment & Plan Note (Signed)
Check A1c. 

## 2022-01-05 NOTE — Assessment & Plan Note (Signed)
Likely age-related memory decline.  He passed mini cog testing.  We will monitor.  Checking B12 and TSH to see if there could be some deficiency or abnormality there that could contribute to memory issue.

## 2022-01-05 NOTE — Assessment & Plan Note (Signed)
He will continue to follow with dermatology.

## 2022-01-05 NOTE — Assessment & Plan Note (Signed)
Stable.  He can continue trazodone 150 mg as needed for sleep at night and Xanax 1 tablet nightly as needed for sleep.

## 2022-01-05 NOTE — Telephone Encounter (Signed)
Patient states at check-out that Dr. Tommi Rumps states he will change his prescription for lisinopril (ZESTRIL) 5 MG tablet to 5 MG daily, but patient states he is already taking 5 MG daily.  Patient states he would like to know how much he should be taking.

## 2022-01-05 NOTE — Patient Instructions (Addendum)
Nice to see you. We will contact you with your lab results. We will reduce your lisinopril to 5 mg daily.  If your lightheadedness is not improving over the next 1 to 2 weeks please let me know. Please use Debrox at home to see if you can soften up the earwax.

## 2022-01-12 ENCOUNTER — Ambulatory Visit: Payer: PPO | Admitting: Dermatology

## 2022-02-03 ENCOUNTER — Ambulatory Visit (INDEPENDENT_AMBULATORY_CARE_PROVIDER_SITE_OTHER): Payer: PPO | Admitting: Family Medicine

## 2022-02-03 DIAGNOSIS — I10 Essential (primary) hypertension: Secondary | ICD-10-CM

## 2022-02-03 DIAGNOSIS — R718 Other abnormality of red blood cells: Secondary | ICD-10-CM | POA: Diagnosis not present

## 2022-02-03 LAB — CBC WITH DIFFERENTIAL/PLATELET
Basophils Absolute: 0.1 10*3/uL (ref 0.0–0.1)
Basophils Relative: 0.7 % (ref 0.0–3.0)
Eosinophils Absolute: 0.5 10*3/uL (ref 0.0–0.7)
Eosinophils Relative: 4.4 % (ref 0.0–5.0)
HCT: 44.1 % (ref 39.0–52.0)
Hemoglobin: 15.2 g/dL (ref 13.0–17.0)
Lymphocytes Relative: 27.2 % (ref 12.0–46.0)
Lymphs Abs: 2.8 10*3/uL (ref 0.7–4.0)
MCHC: 34.5 g/dL (ref 30.0–36.0)
MCV: 100.7 fl — ABNORMAL HIGH (ref 78.0–100.0)
Monocytes Absolute: 1 10*3/uL (ref 0.1–1.0)
Monocytes Relative: 10 % (ref 3.0–12.0)
Neutro Abs: 6 10*3/uL (ref 1.4–7.7)
Neutrophils Relative %: 57.7 % (ref 43.0–77.0)
Platelets: 181 10*3/uL (ref 150.0–400.0)
RBC: 4.39 Mil/uL (ref 4.22–5.81)
RDW: 15.7 % — ABNORMAL HIGH (ref 11.5–15.5)
WBC: 10.5 10*3/uL (ref 4.0–10.5)

## 2022-02-03 NOTE — Assessment & Plan Note (Signed)
BP is well controlled and light headedness has resolved. Patient will continue lisinopril 5 mg daily and atenolol 25 mg daily. He will monitor his BP and if it trends up he will let me know. If he has recurrent light headedness he will let me know as well.

## 2022-02-03 NOTE — Assessment & Plan Note (Signed)
Check cbc with diff today.

## 2022-02-03 NOTE — Progress Notes (Signed)
Tommi Rumps, MD Phone: [phone]  Karl Myers is a 76 y.o. male who presents today for f/u.  HYPERTENSION Disease Monitoring Home BP Monitoring 117/68 Chest pain- no    Dyspnea- no Medications Compliance-  taking lisinopril, atenolol. Lightheadedness-  no, no syncope BMET    Component Value Date/Time   NA 141 01/05/2022 0901   NA 137 05/06/2014 0751   K 4.6 01/05/2022 0901   K 4.1 05/06/2014 0751   CL 102 01/05/2022 0901   CL 103 05/06/2014 0751   CO2 29 01/05/2022 0901   CO2 28 05/06/2014 0751   GLUCOSE 102 (H) 01/05/2022 0901   GLUCOSE 121 (H) 05/06/2014 0751   BUN 11 01/05/2022 0901   BUN 10 05/06/2014 0751   CREATININE 1.02 01/05/2022 0901   CREATININE 1.00 06/17/2018 1439   CALCIUM 9.2 01/05/2022 0901   CALCIUM 8.6 05/06/2014 0751   GFRNONAA >60 05/23/2021 0616   GFRNONAA >60 05/06/2014 0751   GFRAA >60 07/27/2019 0259   GFRAA >60 05/06/2014 0751     Social History   Tobacco Use  Smoking Status Every Day   Packs/day: 1.00   Years: 61.00   Total pack years: 61.00   Types: Cigars, Cigarettes  Smokeless Tobacco Never  Tobacco Comments   0.5PPD 12/03/2021    Current Outpatient Medications on File Prior to Visit  Medication Sig Dispense Refill   ALPRAZolam (XANAX) 0.5 MG tablet TAKE 1/2 TABLET BY MOUTH EVERY MORNING AND TAKE 1 TABLET BY MOUTH AT BEDTIME ASDIRECTED 45 tablet 1   atenolol (TENORMIN) 25 MG tablet TAKE ONE TABLET BY MOUTH AT BEDTIME 90 tablet 3   atorvastatin (LIPITOR) 80 MG tablet TAKE 1 TABLET BY MOUTH DAILY. 90 tablet 1   ELIQUIS 5 MG TABS tablet TAKE ONE TABLET BY MOUTH TWICE DAILY 90 tablet 3   Fluticasone-Umeclidin-Vilant (TRELEGY ELLIPTA) 100-62.5-25 MCG/ACT AEPB Inhale 1 puff into the lungs daily. 28 each 11   gabapentin (NEURONTIN) 100 MG capsule TAKE 1 CAPSULE BY MOUTH 3 TIMES DAILY 270 capsule 1   lisinopril (ZESTRIL) 5 MG tablet Take 1 tablet (5 mg total) by mouth at bedtime. 90 tablet 3   nitroGLYCERIN (NITROSTAT) 0.4  MG SL tablet Place 1 tablet (0.4 mg total) under the tongue every 5 (five) minutes as needed for chest pain. For up to 3 doses per episode. 50 tablet 0   Spacer/Aero-Holding Chambers (AEROCHAMBER PLUS) inhaler Use with inhaler 1 each 2   traZODone (DESYREL) 150 MG tablet TAKE ONE TABLET AT BEDTIME 90 tablet 1   No current facility-administered medications on file prior to visit.     ROS see history of present illness  Objective  Physical Exam Vitals:   02/03/22 1259  BP: 120/70  Pulse: 67  Temp: 97.6 F (36.4 C)  SpO2: 99%    BP Readings from Last 3 Encounters:  02/03/22 120/70  01/05/22 130/80  12/03/21 120/62   Wt Readings from Last 3 Encounters:  02/03/22 162 lb (73.5 kg)  01/05/22 164 lb 12.8 oz (74.8 kg)  12/03/21 165 lb 12.8 oz (75.2 kg)    Physical Exam Constitutional:      General: He is not in acute distress.    Appearance: He is not diaphoretic.  Cardiovascular:     Rate and Rhythm: Normal rate and regular rhythm.     Heart sounds: Normal heart sounds.  Pulmonary:     Effort: Pulmonary effort is normal.  Skin:    General: Skin is warm and dry.  Neurological:  Mental Status: He is alert.      Assessment/Plan: Please see individual problem list.  Problem List Items Addressed This Visit     Hypertension (Chronic)    BP is well controlled and light headedness has resolved. Patient will continue lisinopril 5 mg daily and atenolol 25 mg daily. He will monitor his BP and if it trends up he will let me know. If he has recurrent light headedness he will let me know as well.       Elevated MCV    Check cbc with diff today.       Relevant Orders   CBC w/Diff    Return in about 6 months (around 08/06/2022) for HTN.   Tommi Rumps, MD [place]

## 2022-02-05 ENCOUNTER — Other Ambulatory Visit: Payer: Self-pay | Admitting: Family Medicine

## 2022-02-05 DIAGNOSIS — D7589 Other specified diseases of blood and blood-forming organs: Secondary | ICD-10-CM

## 2022-02-10 ENCOUNTER — Telehealth: Payer: Self-pay

## 2022-02-10 NOTE — Telephone Encounter (Signed)
Lvm for pt to return call in regards to lab results.  Per Dr.Sonnenberg: Please let the patient know that his MCV remains mildly elevated.  We need to evaluate this further with additional lab work.  Orders placed.

## 2022-02-11 NOTE — Telephone Encounter (Signed)
Called pt x2 lvm x2 for pt to return call in regards to lab results.  Per Dr.Sonnenberg: Please let the patient know that his MCV remains mildly elevated.  We need to evaluate this further with additional lab work.  Orders placed.

## 2022-02-12 NOTE — Telephone Encounter (Signed)
Called pt x3 lvm x3 for pt to return call in regards to lab results.  Letter created & sent to mail for pt to reach out to ofc.   Per Dr.Sonnenberg: Please let the patient know that his MCV remains mildly elevated.  We need to evaluate this further with additional lab work.  Orders placed.

## 2022-02-13 ENCOUNTER — Telehealth: Payer: Self-pay

## 2022-02-13 ENCOUNTER — Telehealth: Payer: Self-pay | Admitting: Family Medicine

## 2022-02-13 NOTE — Telephone Encounter (Signed)
Patient has a lab appt 02/17/2022, there are no orders in.

## 2022-02-13 NOTE — Telephone Encounter (Signed)
Patient returned [facility], [place] call.  I read Dr. Georges Mouse message to patient and scheduled a lab visit for patient on 02/17/2022.  Patient has additional questions regarding MCV, please call.

## 2022-02-16 NOTE — Addendum Note (Signed)
Addended by: Leone Haven on: 02/16/2022 12:37 PM   Modules accepted: Orders

## 2022-02-17 ENCOUNTER — Other Ambulatory Visit (INDEPENDENT_AMBULATORY_CARE_PROVIDER_SITE_OTHER): Payer: PPO

## 2022-02-17 ENCOUNTER — Other Ambulatory Visit: Payer: Self-pay

## 2022-02-17 DIAGNOSIS — R718 Other abnormality of red blood cells: Secondary | ICD-10-CM

## 2022-02-17 DIAGNOSIS — D7589 Other specified diseases of blood and blood-forming organs: Secondary | ICD-10-CM | POA: Diagnosis not present

## 2022-02-17 LAB — VITAMIN B12: Vitamin B-12: 324 pg/mL (ref 211–911)

## 2022-02-17 LAB — FOLATE: Folate: 14.7 ng/mL (ref >5.9)

## 2022-02-17 NOTE — Telephone Encounter (Signed)
Karl Myers Dr. Caryl Bis ordered the labs and it is scheduled to be released,( whatever that means) at 11:13 am this morning.  Check on that okay because I guess there is a reason for it.  Lashunda Greis,cma

## 2022-03-11 ENCOUNTER — Other Ambulatory Visit: Payer: Self-pay | Admitting: Family Medicine

## 2022-03-11 DIAGNOSIS — E785 Hyperlipidemia, unspecified: Secondary | ICD-10-CM

## 2022-03-19 ENCOUNTER — Other Ambulatory Visit (INDEPENDENT_AMBULATORY_CARE_PROVIDER_SITE_OTHER): Payer: PPO

## 2022-03-19 DIAGNOSIS — R718 Other abnormality of red blood cells: Secondary | ICD-10-CM | POA: Diagnosis not present

## 2022-03-19 LAB — CBC
HCT: 45.8 % (ref 39.0–52.0)
Hemoglobin: 15.2 g/dL (ref 13.0–17.0)
MCHC: 33.2 g/dL (ref 30.0–36.0)
MCV: 102.4 fl — ABNORMAL HIGH (ref 78.0–100.0)
Platelets: 172 10*3/uL (ref 150.0–400.0)
RBC: 4.48 Mil/uL (ref 4.22–5.81)
RDW: 16.1 % — ABNORMAL HIGH (ref 11.5–15.5)
WBC: 8.4 10*3/uL (ref 4.0–10.5)

## 2022-03-20 ENCOUNTER — Other Ambulatory Visit: Payer: Self-pay | Admitting: Family Medicine

## 2022-03-20 DIAGNOSIS — R718 Other abnormality of red blood cells: Secondary | ICD-10-CM

## 2022-03-21 DIAGNOSIS — J441 Chronic obstructive pulmonary disease with (acute) exacerbation: Secondary | ICD-10-CM | POA: Diagnosis not present

## 2022-03-21 DIAGNOSIS — J0100 Acute maxillary sinusitis, unspecified: Secondary | ICD-10-CM | POA: Diagnosis not present

## 2022-03-23 ENCOUNTER — Ambulatory Visit: Payer: PPO | Admitting: Dermatology

## 2022-03-23 DIAGNOSIS — L89321 Pressure ulcer of left buttock, stage 1: Secondary | ICD-10-CM | POA: Diagnosis not present

## 2022-03-23 DIAGNOSIS — Z79899 Other long term (current) drug therapy: Secondary | ICD-10-CM

## 2022-03-23 MED ORDER — TACROLIMUS 0.1 % EX OINT: TOPICAL_OINTMENT | Freq: Two times a day (BID) | CUTANEOUS | 1 refills | Status: DC

## 2022-03-23 NOTE — Progress Notes (Signed)
   Follow-Up Visit   Subjective  Karl Myers is a 76 y.o. male who presents for the following: check spot (L buttock ~ 1wk, hx of bleeding).  The following portions of the chart were reviewed this encounter and updated as appropriate:   Tobacco  Allergies  Meds  Problems  Med Hx  Surg Hx  Fam Hx     Review of Systems:  No other skin or systemic complaints except as noted in HPI or Assessment and Plan.  Objective  Well appearing patient in no apparent distress; mood and affect are within normal limits.  A focused examination was performed including buttocks. Relevant physical exam findings are noted in the Assessment and Plan.  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 Condition is symptomatic / bothersome to patient. Not to goal.  Start Protopic oint 0.1% bid aa left buttocks until resolved Recommend decrease sitting and get up and move around more often  tacrolimus (PROTOPIC) 0.1 % ointment - L inf buttocks Apply topically 2 (two) times daily. Bid to aa buttocks until resolved  Return for as scheduled for TBSE, hx of Melanoma, Hx of AKs.  I, Karl Myers, RMA, am acting as scribe for Karl Ser, MD . Documentation: I have reviewed the above documentation for accuracy and completeness, and I agree with the above.  Karl Ser, MD

## 2022-03-23 NOTE — Patient Instructions (Signed)
Due to recent changes in healthcare laws, you may see results of your pathology and/or laboratory studies on MyChart before the doctors have had a chance to review them. We understand that in some cases there may be results that are confusing or concerning to you. Please understand that not all results are received at the same time and often the doctors may need to interpret multiple results in order to provide you with the best plan of care or course of treatment. Therefore, we ask that you please give us 2 business days to thoroughly review all your results before contacting the office for clarification. Should we see a critical lab result, you will be contacted sooner.   If You Need Anything After Your Visit  If you have any questions or concerns for your doctor, please call our main line at [phone] and press option 4 to reach your doctor's medical assistant. If no one answers, please leave a voicemail as directed and we will return your call as soon as possible. Messages left after 4 pm will be answered the following business day.   You may also send us a message via MyChart. We typically respond to MyChart messages within 1-2 business days.  For prescription refills, please ask your pharmacy to contact our office. Our fax number is [phone].  If you have an urgent issue when the clinic is closed that cannot wait until the next business day, you can page your doctor at the number below.    Please note that while we do our best to be available for urgent issues outside of office hours, we are not available 24/7.   If you have an urgent issue and are unable to reach us, you may choose to seek medical care at your doctor's office, retail clinic, urgent care center, or emergency room.  If you have a medical emergency, please immediately call 911 or go to the emergency department.  Pager Numbers  - Dr. Kowalski: [phone]  - Dr. Moye: [phone]  - Dr. Stewart:  [phone]  In the event of inclement weather, please call our main line at [phone] for an update on the status of any delays or closures.  Dermatology Medication Tips: Please keep the boxes that topical medications come in in order to help keep track of the instructions about where and how to use these. Pharmacies typically print the medication instructions only on the boxes and not directly on the medication tubes.   If your medication is too expensive, please contact our office at [phone] option 4 or send us a message through MyChart.   We are unable to tell what your co-pay for medications will be in advance as this is different depending on your insurance coverage. However, we may be able to find a substitute medication at lower cost or fill out paperwork to get insurance to cover a needed medication.   If a prior authorization is required to get your medication covered by your insurance company, please allow us 1-2 business days to complete this process.  Drug prices often vary depending on where the prescription is filled and some pharmacies may offer cheaper prices.  The website www.goodrx.com contains coupons for medications through different pharmacies. The prices here do not account for what the cost may be with help from insurance (it may be cheaper with your insurance), but the website can give you the price if you did not use any insurance.  - You can print the associated coupon and take it with   your prescription to the pharmacy.  - You may also stop by our office during regular business hours and pick up a GoodRx coupon card.  - If you need your prescription sent electronically to a different pharmacy, notify our office through [facility] [place] MyChart or by phone at [phone] option 4.     Si Usted Necesita Algo Despus de Su Visita  Tambin puede enviarnos un mensaje a travs de MyChart. Por lo general respondemos a los mensajes de MyChart en el transcurso de 1 a 2  das hbiles.  Para renovar recetas, por favor pida a su farmacia que se ponga en contacto con nuestra oficina. Nuestro nmero de fax es el [phone].  Si tiene un asunto urgente cuando la clnica est cerrada y que no puede esperar hasta el siguiente da hbil, puede llamar/localizar a su doctor(a) al nmero que aparece a continuacin.   Por favor, tenga en cuenta que aunque hacemos todo lo posible para estar disponibles para asuntos urgentes fuera del horario de oficina, no estamos disponibles las 24 horas del da, los 7 das de la semana.   Si tiene un problema urgente y no puede comunicarse con nosotros, puede optar por buscar atencin mdica  en el consultorio de su doctor(a), en una clnica privada, en un centro de atencin urgente o en una sala de emergencias.  Si tiene una emergencia mdica, por favor llame inmediatamente al 911 o vaya a la sala de emergencias.  Nmeros de bper  - Dr. Kowalski: [phone]  - Dra. Moye: [phone]  - Dra. Stewart: [phone]  En caso de inclemencias del tiempo, por favor llame a nuestra lnea principal al [phone] para una actualizacin sobre el estado de cualquier retraso o cierre.  Consejos para la medicacin en dermatologa: Por favor, guarde las cajas en las que vienen los medicamentos de uso tpico para ayudarle a seguir las instrucciones sobre dnde y cmo usarlos. Las farmacias generalmente imprimen las instrucciones del medicamento slo en las cajas y no directamente en los tubos del medicamento.   Si su medicamento es muy caro, por favor, pngase en contacto con nuestra oficina llamando al [phone] y presione la opcin 4 o envenos un mensaje a travs de MyChart.   No podemos decirle cul ser su copago por los medicamentos por adelantado ya que esto es diferente dependiendo de la cobertura de su seguro. Sin embargo, es posible que podamos encontrar un medicamento sustituto a menor costo o llenar un formulario para que el  seguro cubra el medicamento que se considera necesario.   Si se requiere una autorizacin previa para que su compaa de seguros cubra su medicamento, por favor permtanos de 1 a 2 das hbiles para completar este proceso.  Los precios de los medicamentos varan con frecuencia dependiendo del lugar de dnde se surte la receta y alguna farmacias pueden ofrecer precios ms baratos.  El sitio web www.goodrx.com tiene cupones para medicamentos de diferentes farmacias. Los precios aqu no tienen en cuenta lo que podra costar con la ayuda del seguro (puede ser ms barato con su seguro), pero el sitio web puede darle el precio si no utiliz ningn seguro.  - Puede imprimir el cupn correspondiente y llevarlo con su receta a la farmacia.  - Tambin puede pasar por nuestra oficina durante el horario de atencin regular y recoger una tarjeta de cupones de GoodRx.  - Si necesita que su receta se enve electrnicamente a una farmacia diferente, informe a nuestra oficina a travs de MyChart de [place]   o por telfono llamando al [phone] y presione la opcin 4.  

## 2022-03-24 ENCOUNTER — Ambulatory Visit (INDEPENDENT_AMBULATORY_CARE_PROVIDER_SITE_OTHER): Payer: PPO

## 2022-03-24 ENCOUNTER — Ambulatory Visit
Admission: RE | Admit: 2022-03-24 | Discharge: 2022-03-24 | Disposition: A | Discharge status: Home / Self Care | Payer: PPO | Source: Ambulatory Visit | Attending: Acute Care | Admitting: Acute Care

## 2022-03-24 VITALS — Ht 68.0 in | Wt 162.0 lb

## 2022-03-24 DIAGNOSIS — F17210 Nicotine dependence, cigarettes, uncomplicated: Secondary | ICD-10-CM | POA: Diagnosis not present

## 2022-03-24 DIAGNOSIS — J439 Emphysema, unspecified: Secondary | ICD-10-CM | POA: Diagnosis not present

## 2022-03-24 DIAGNOSIS — Z87891 Personal history of nicotine dependence: Secondary | ICD-10-CM | POA: Diagnosis not present

## 2022-03-24 DIAGNOSIS — Z122 Encounter for screening for malignant neoplasm of respiratory organs: Secondary | ICD-10-CM | POA: Diagnosis not present

## 2022-03-24 DIAGNOSIS — Z0000 Encounter for general adult medical examination without abnormal findings: Secondary | ICD-10-CM | POA: Diagnosis not present

## 2022-03-24 DIAGNOSIS — R911 Solitary pulmonary nodule: Secondary | ICD-10-CM | POA: Diagnosis not present

## 2022-03-24 NOTE — Patient Instructions (Addendum)
Karl Myers , Thank you for taking time to come for your Medicare Wellness Visit. I appreciate your ongoing commitment to your health goals. Please review the following plan we discussed and let me know if I can assist you in the future.   These are the goals we discussed:  Goals       Patient Stated     DIET - INCREASE WATER INTAKE (pt-stated)      Maintain Healthy Lifestyle (pt-stated)      Stay active; walk more for exercise Healthy diet        This is a list of the screening recommended for you and due dates:  Health Maintenance  Topic Date Due   Pneumonia Vaccine (1 - PCV) 07/01/2022*   Flu Shot  09/06/2022*   Tetanus Vaccine  03/25/2023*   Hepatitis C Screening: USPSTF Recommendation to screen - Ages 18-79 yo.  Completed   Zoster (Shingles) Vaccine  Completed   HPV Vaccine  Aged Out   COVID-19 Vaccine  Discontinued  *Topic was postponed. The date shown is not the original due date.    Advanced directives: on file  Conditions/risks identified: none new  Next appointment: Follow up in one year for your annual wellness visit.   Preventive Care 25 Years and Older, Male  Preventive care refers to lifestyle choices and visits with your health care provider that can promote health and wellness. What does preventive care include? A yearly physical exam. This is also called an annual well check. Dental exams once or twice a year. Routine eye exams. Ask your health care provider how often you should have your eyes checked. Personal lifestyle choices, including: Daily care of your teeth and gums. Regular physical activity. Eating a healthy diet. Avoiding tobacco and drug use. Limiting alcohol use. Practicing safe sex. Taking low doses of aspirin every day. Taking vitamin and mineral supplements as recommended by your health care provider. What happens during an annual well check? The services and screenings done by your health care provider during your annual well check  will depend on your age, overall health, lifestyle risk factors, and family history of disease. Counseling  Your health care provider may ask you questions about your: Alcohol use. Tobacco use. Drug use. Emotional well-being. Home and relationship well-being. Sexual activity. Eating habits. History of falls. Memory and ability to understand (cognition). Work and work Statistician. Screening  You may have the following tests or measurements: Height, weight, and BMI. Blood pressure. Lipid and cholesterol levels. These may be checked every 5 years, or more frequently if you are over 46 years old. Skin check. Lung cancer screening. You may have this screening every year starting at age 31 if you have a 30-pack-year history of smoking and currently smoke or have quit within the past 15 years. Fecal occult blood test (FOBT) of the stool. You may have this test every year starting at age 46. Flexible sigmoidoscopy or colonoscopy. You may have a sigmoidoscopy every 5 years or a colonoscopy every 10 years starting at age 48. Prostate cancer screening. Recommendations will vary depending on your family history and other risks. Hepatitis C blood test. Hepatitis B blood test. Sexually transmitted disease (STD) testing. Diabetes screening. This is done by checking your blood sugar (glucose) after you have not eaten for a while (fasting). You may have this done every 1-3 years. Abdominal aortic aneurysm (AAA) screening. You may need this if you are a current or former smoker. Osteoporosis. You may be screened starting  at age 47 if you are at high risk. Talk with your health care provider about your test results, treatment options, and if necessary, the need for more tests. Vaccines  Your health care provider may recommend certain vaccines, such as: Influenza vaccine. This is recommended every year. Tetanus, diphtheria, and acellular pertussis (Tdap, Td) vaccine. You may need a Td booster every 10  years. Zoster vaccine. You may need this after age 23. Pneumococcal 13-valent conjugate (PCV13) vaccine. One dose is recommended after age 55. Pneumococcal polysaccharide (PPSV23) vaccine. One dose is recommended after age 84. Talk to your health care provider about which screenings and vaccines you need and how often you need them. This information is not intended to replace advice given to you by your health care provider. Make sure you discuss any questions you have with your health care provider. Document Released: 06/21/2015 Document Revised: 02/12/2016 Document Reviewed: 03/26/2015 Elsevier Interactive Patient Education  2017 [place] Prevention in the Home Falls can cause injuries. They can happen to people of all ages. There are many things you can do to make your home safe and to help prevent falls. What can I do on the outside of my home? Regularly fix the edges of walkways and driveways and fix any cracks. Remove anything that might make you trip as you walk through a door, such as a raised step or threshold. Trim any bushes or trees on the path to your home. Use bright outdoor lighting. Clear any walking paths of anything that might make someone trip, such as rocks or tools. Regularly check to see if handrails are loose or broken. Make sure that both sides of any steps have handrails. Any raised decks and porches should have guardrails on the edges. Have any leaves, snow, or ice cleared regularly. Use sand or salt on walking paths during winter. Clean up any spills in your garage right away. This includes oil or grease spills. What can I do in the bathroom? Use night lights. Install grab bars by the toilet and in the tub and shower. Do not use towel bars as grab bars. Use non-skid mats or decals in the tub or shower. If you need to sit down in the shower, use a plastic, non-slip stool. Keep the floor dry. Clean up any water that spills on the floor as soon as it  happens. Remove soap buildup in the tub or shower regularly. Attach bath mats securely with double-sided non-slip rug tape. Do not have throw rugs and other things on the floor that can make you trip. What can I do in the bedroom? Use night lights. Make sure that you have a light by your bed that is easy to reach. Do not use any sheets or blankets that are too big for your bed. They should not hang down onto the floor. Have a firm chair that has side arms. You can use this for support while you get dressed. Do not have throw rugs and other things on the floor that can make you trip. What can I do in the kitchen? Clean up any spills right away. Avoid walking on wet floors. Keep items that you use a lot in easy-to-reach places. If you need to reach something above you, use a strong step stool that has a grab bar. Keep electrical cords out of the way. Do not use floor polish or wax that makes floors slippery. If you must use wax, use non-skid floor wax. Do not have throw rugs  and other things on the floor that can make you trip. What can I do with my stairs? Do not leave any items on the stairs. Make sure that there are handrails on both sides of the stairs and use them. Fix handrails that are broken or loose. Make sure that handrails are as long as the stairways. Check any carpeting to make sure that it is firmly attached to the stairs. Fix any carpet that is loose or worn. Avoid having throw rugs at the top or bottom of the stairs. If you do have throw rugs, attach them to the floor with carpet tape. Make sure that you have a light switch at the top of the stairs and the bottom of the stairs. If you do not have them, ask someone to add them for you. What else can I do to help prevent falls? Wear shoes that: Do not have high heels. Have rubber bottoms. Are comfortable and fit you well. Are closed at the toe. Do not wear sandals. If you use a stepladder: Make sure that it is fully opened.  Do not climb a closed stepladder. Make sure that both sides of the stepladder are locked into place. Ask someone to hold it for you, if possible. Clearly mark and make sure that you can see: Any grab bars or handrails. First and last steps. Where the edge of each step is. Use tools that help you move around (mobility aids) if they are needed. These include: Canes. Walkers. Scooters. Crutches. Turn on the lights when you go into a dark area. Replace any light bulbs as soon as they burn out. Set up your furniture so you have a clear path. Avoid moving your furniture around. If any of your floors are uneven, fix them. If there are any pets around you, be aware of where they are. Review your medicines with your doctor. Some medicines can make you feel dizzy. This can increase your chance of falling. Ask your doctor what other things that you can do to help prevent falls. This information is not intended to replace advice given to you by your health care provider. Make sure you discuss any questions you have with your health care provider. Document Released: 03/21/2009 Document Revised: 10/31/2015 Document Reviewed: 06/29/2014 Elsevier Interactive Patient Education  2017 [employer].

## 2022-03-24 NOTE — Progress Notes (Signed)
Subjective:   Karl Myers is a 76 y.o. male who presents for Medicare Annual/Subsequent preventive examination.  Review of Systems    No ROS.  Medicare Wellness Virtual Visit.  Visual/audio telehealth visit, UTA vital signs.   See social history for additional risk factors.   Cardiac Risk Factors include: advanced age (>64mn, >>65women);male gender     Objective:    Today's Vitals   03/24/22 1238  Weight: 162 lb (73.5 kg)  Height: '5\' 8"'$  (1.727 m)   Body mass index is 24.63 kg/m.     03/24/2022   12:47 PM 05/22/2021    3:12 PM 03/07/2021   10:29 AM 07/24/2019   11:00 PM 07/24/2019   11:14 AM 02/17/2019    9:45 AM 08/25/2018    9:37 AM  Advanced Directives  Does Patient Have a Medical Advance Directive? Yes No Yes Yes No Yes No  Type of AParamedicof ABroganLiving will  H[place]spectLiving will Living will  H[place]Living will   Does patient want to make changes to medical advance directive? No - Patient declined  No - Patient declined No - Patient declined  No - Patient declined   Copy of [place] Chart? Yes - validated most recent copy scanned in chart (See row information)  Yes - validated most recent copy scanned in chart (See row information)   Yes - validated most recent copy scanned in chart (See row information)   Would patient like information on creating a medical advance directive?  No - Patient declined   No - Patient declined  No - Patient declined    Current Medications (verified) Outpatient Encounter Medications as of 03/24/2022  Medication Sig   ALPRAZolam (XANAX) 0.5 MG tablet TAKE 1/2 TABLET BY MOUTH EVERY MORNING AND TAKE 1 TABLET BY MOUTH AT BEDTIME ASDIRECTED   atenolol (TENORMIN) 25 MG tablet TAKE ONE TABLET BY MOUTH AT BEDTIME   atorvastatin (LIPITOR) 80 MG tablet TAKE 1 TABLET BY MOUTH DAILY.   ELIQUIS 5 MG TABS tablet TAKE ONE TABLET BY MOUTH TWICE DAILY    Fluticasone-Umeclidin-Vilant (TRELEGY ELLIPTA) 100-62.5-25 MCG/ACT AEPB Inhale 1 puff into the lungs daily.   gabapentin (NEURONTIN) 100 MG capsule TAKE 1 CAPSULE BY MOUTH 3 TIMES DAILY   lisinopril (ZESTRIL) 5 MG tablet Take 1 tablet (5 mg total) by mouth at bedtime.   nitroGLYCERIN (NITROSTAT) 0.4 MG SL tablet Place 1 tablet (0.4 mg total) under the tongue every 5 (five) minutes as needed for chest pain. For up to 3 doses per episode.   Spacer/Aero-Holding Chambers (AEROCHAMBER PLUS) inhaler Use with inhaler   tacrolimus (PROTOPIC) 0.1 % ointment Apply topically 2 (two) times daily. Bid to aa buttocks until resolved   traZODone (DESYREL) 150 MG tablet TAKE ONE TABLET AT BEDTIME   No facility-administered encounter medications on file as of 03/24/2022.    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   Pre-diabetes    Past Surgical History:  Procedure Laterality Date   BACK SURGERY  x 3   CARPAL TUNNEL RELEASE Right 08/19/2017   Procedure: CARPAL TUNNEL RELEASE;  Surgeon: Hessie Knows, MD;  Location: [facility] ORS;  Service: Orthopedics;  Laterality: Right;   CATARACT EXTRACTION W/PHACO Right 11/17/2016   Procedure: CATARACT EXTRACTION PHACO AND INTRAOCULAR LENS PLACEMENT (IOC);  Surgeon: Birder Robson, MD;  Location: [facility] ORS;  Service: Ophthalmology;  Laterality: Right;  [place] 00:37 AP% 12.6 CDE 4.69 Fluid pack lot # [id number]   CATARACT EXTRACTION W/PHACO Left 12/08/2016   Procedure: CATARACT EXTRACTION PHACO AND INTRAOCULAR LENS PLACEMENT (IOC);  Surgeon: Birder Robson, MD;  Location: [facility]  ORS;  Service: Ophthalmology;  Laterality: Left;  [place] 00:33 AP% 13.9 CDE 4.63 Fluid pack lot # [id number]   ULNAR TUNNEL RELEASE Right 08/19/2017   Procedure: CUBITAL TUNNEL RELEASE;  Surgeon: Hessie Knows, MD;  Location: [facility] ORS;  Service: Orthopedics;  Laterality: Right;   Family History  Problem Relation Age of Onset   Heart attack Mother    Prostate cancer Father    Breast cancer Sister 86   Dementia Sister    Cancer Brother        unknown what kind   Social History   Socioeconomic History   Marital status: Married    Spouse name: Not on file   Number of children: Not on file   Years of education: Not on file   Highest education level: Not on file  Occupational History   Not on file  Tobacco Use   Smoking status: Every Day    Packs/day: 1.00    Years: 61.00    Total pack years: 61.00    Types: Cigars, Cigarettes   Smokeless tobacco: Never   Tobacco comments:    0.5PPD 12/03/2021  Vaping Use   Vaping Use: Never used  Substance and Sexual Activity   Alcohol use: No   Drug use: No   Sexual activity: Not on file  Other Topics Concern   Not on file  Social History Narrative   Not on file   Social Determinants of Health   Financial Resource Strain: Low Risk  (03/24/2022)   Overall Financial Resource Strain (CARDIA)    Difficulty of Paying Living Expenses: Not hard at all  Food Insecurity: No Food Insecurity (03/24/2022)   Hunger Vital Sign    Worried About Running Out of Food in the Last Year: Never true    [place] in the Last Year: Never true  Transportation Needs: No Transportation Needs (03/24/2022)   PRAPARE - Hydrologist (Medical): No    Lack of Transportation (Non-Medical): No  Physical Activity: Unknown (02/02/2018)   Exercise Vital Sign    Days of Exercise per Week: 0 days    Minutes of Exercise per Session: Not on file  Stress: No Stress Concern Present (03/24/2022)   [place]    Feeling of Stress : Not at all  Social Connections: Unknown (03/24/2022)   Social Connection and Isolation Panel [NHANES]    Frequency of Communication with Friends and Family: Not on file    Frequency of Social Gatherings with Friends and Family: Not on file    Attends Religious Services: Not on file    Active Member of Clubs or Organizations: Not on file    Attends Archivist Meetings: Not on file    Marital Status: Married    Tobacco Counseling Ready to quit: Not Answered Counseling given: Not Answered Tobacco comments:  0.5PPD 12/03/2021   Clinical Intake:  Pre-visit preparation completed: Yes        Diabetes: No  How often do you need to have someone help you when you read instructions, pamphlets, or other written materials from your doctor or pharmacy?: 1 - Never    Interpreter Needed?: No      Activities of Daily Living    03/24/2022   12:43 PM  In your present state of health, do you have any difficulty performing the following activities:  Hearing? 0  Vision? 0  Difficulty concentrating or making decisions? 0  Walking or climbing stairs? 0  Dressing or bathing? 0  Doing errands, shopping? 0  Preparing Food and eating ? N  Using the Toilet? N  In the past six months, have you accidently leaked urine? N  Do you have problems with loss of bowel control? N  Managing your Medications? N  Managing your Finances? N  Housekeeping or managing your Housekeeping? N    Patient Care Team: Leone Haven, MD as PCP - General (Family Medicine) Minna Merritts, MD as PCP - Cardiology (Cardiology)  Indicate any recent Medical Services you may have received from other than [facility] providers in the past year (date may be approximate).     Assessment:   This is a routine wellness examination for [place].  I connected with  Karl Myers on 03/24/22 by a audio enabled telemedicine application and verified that I  am speaking with the correct person using two identifiers.  Patient Location: Home  Provider Location: Office/Clinic  I discussed the limitations of evaluation and management by telemedicine. The patient expressed understanding and agreed to proceed.   Hearing/Vision screen Hearing Screening - Comments:: Patient is able to hear conversational tones without difficulty. No issues reported.  Vision Screening - Comments:: Followed by [facility] Wears reading glasses  Annual exam  Cataract extraction, bilateral  They have regular follow up with the ophthalmologist  Dietary issues and exercise activities discussed: Current Exercise Habits: Home exercise routine, Intensity: Mild   Goals Addressed               This Visit's Progress     Patient Stated     Maintain Healthy Lifestyle (pt-stated)        Stay active; walk more for exercise Healthy diet       Depression Screen    03/24/2022   12:43 PM 01/05/2022    8:20 AM 07/07/2021    8:39 AM 03/07/2021   10:28 AM 12/17/2020   12:05 PM 06/14/2020    9:39 AM 03/06/2020   11:45 AM  PHQ 2/9 Scores  PHQ - 2 Score 0 0 0 0 0 0 0    Fall Risk    03/24/2022   12:41 PM 01/05/2022    8:20 AM 07/07/2021    8:39 AM 03/07/2021   10:31 AM 12/17/2020   12:05 PM  Fall Risk   Falls in the past year? 0 0 0 0 0  Number falls in past yr: 0 0 0  0  Injury with Fall? 0 0 0    Risk for fall due to :  No Fall Risks No Fall Risks    Follow up Falls evaluation completed Falls evaluation completed Falls evaluation completed Falls evaluation completed Falls evaluation completed    [place]: Home free of loose throw rugs in walkways, pet beds, electrical cords, etc? Yes  Adequate lighting in your  home to reduce risk of falls? Yes   ASSISTIVE DEVICES UTILIZED TO PREVENT FALLS: Life alert? No  Use of a cane, walker or w/c? No   TIMED UP AND GO: Was the test performed? No .   Cognitive Function:     02/02/2018   10:20 AM  MMSE - Mini Mental State Exam  Orientation to time 5  Orientation to Place 5  Registration 3  Attention/ Calculation 5  Recall 3  Language- name 2 objects 2  Language- repeat 1  Language- follow 3 step command 3  Language- read & follow direction 1  Write a sentence 1  Copy design 1  Total score 30        03/24/2022   12:48 PM 03/07/2021   10:37 AM 02/17/2019    9:53 AM  6CIT Screen  What Year? 0 points 0 points 0 points  What month? 0 points 0 points 0 points  What time? 0 points 0 points 0 points  Count back from 20 0 points 0 points 0 points  Months in reverse 0 points 0 points 0 points  Repeat phrase 2 points  0 points  Total Score 2 points  0 points    Immunizations Immunization History  Administered Date(s) Administered   Fluad Quad(high Dose 65+) 02/20/2019, 03/07/2020, 04/21/2021   Influenza, High Dose Seasonal PF 03/12/2017, 03/31/2018   Influenza-Unspecified 02/20/2019   PFIZER(Purple Top)SARS-COV-2 Vaccination 10/27/2019, 12/01/2019   Zoster Recombinat (Shingrix) 12/03/2020, 03/07/2021   TDAP status: Due, Education has been provided regarding the importance of this vaccine. Advised may receive this vaccine at local pharmacy or Health Dept. Aware to provide a copy of the vaccination record if obtained from local pharmacy or Health Dept. Verbalized acceptance and understanding.  Flu Vaccine status: Due, Education has been provided regarding the importance of this vaccine. Advised may receive this vaccine at local pharmacy or Health Dept. Aware to provide a copy of the vaccination record if obtained from local pharmacy or Health Dept. Verbalized acceptance and understanding.  Covid-19 vaccine status: Completed vaccines x2.  Screening Tests Health Maintenance  Topic Date Due   Pneumonia Vaccine 61+ Years old (1 - PCV) 07/01/2022 (Originally 08/31/1951)   INFLUENZA VACCINE  09/06/2022 (Originally 01/06/2022)   TETANUS/TDAP  03/25/2023  (Originally 08/30/1964)   Hepatitis C Screening  Completed   Zoster Vaccines- Shingrix  Completed   HPV VACCINES  Aged Out   COVID-19 Vaccine  Discontinued   Health Maintenance There are no preventive care reminders to display for this patient.  CT Chest LCS Nodule f/u low dose: completed today at 9:00.   Hepatitis C Screening: Completed 2019.  Vision Screening: Recommended annual ophthalmology exams for early detection of glaucoma and other disorders of the eye.  Dental Screening: Recommended annual dental exams for proper oral hygiene.  Community Resource Referral / Chronic Care Management: CRR required this visit?  No   CCM required this visit?  No      Plan:     I have personally reviewed and noted the following in the patient's chart:   Medical and social history Use of alcohol, tobacco or illicit drugs  Current medications and supplements including opioid prescriptions. Patient is not currently taking opioid prescriptions. Functional ability and status Nutritional status Physical activity Advanced directives List of other physicians Hospitalizations, surgeries, and ER visits in previous 12 months Vitals Screenings to include cognitive, depression, and falls Referrals and appointments  In addition, I have reviewed and discussed with patient certain preventive protocols,  quality metrics, and best practice recommendations. A written personalized care plan for preventive services as well as general preventive health recommendations were provided to patient.     Varney Biles, LPN   [id number]

## 2022-03-25 ENCOUNTER — Encounter: Payer: Self-pay | Admitting: Oncology

## 2022-03-25 NOTE — Progress Notes (Unsigned)
Chart updated with patient's wife. She states that pt is taking Eliquis only once a day.

## 2022-03-26 ENCOUNTER — Inpatient Hospital Stay: Payer: PPO

## 2022-03-26 ENCOUNTER — Other Ambulatory Visit: Payer: Self-pay | Admitting: Acute Care

## 2022-03-26 ENCOUNTER — Inpatient Hospital Stay: Payer: PPO | Attending: Oncology | Admitting: Oncology

## 2022-03-26 VITALS — BP 131/67 | HR 67 | Temp 98.1°F | Resp 18 | Wt 165.1 lb

## 2022-03-26 DIAGNOSIS — Z8042 Family history of malignant neoplasm of prostate: Secondary | ICD-10-CM | POA: Diagnosis not present

## 2022-03-26 DIAGNOSIS — D7589 Other specified diseases of blood and blood-forming organs: Secondary | ICD-10-CM | POA: Diagnosis not present

## 2022-03-26 DIAGNOSIS — Z85820 Personal history of malignant melanoma of skin: Secondary | ICD-10-CM | POA: Insufficient documentation

## 2022-03-26 DIAGNOSIS — E538 Deficiency of other specified B group vitamins: Secondary | ICD-10-CM | POA: Diagnosis not present

## 2022-03-26 DIAGNOSIS — F17210 Nicotine dependence, cigarettes, uncomplicated: Secondary | ICD-10-CM | POA: Insufficient documentation

## 2022-03-26 DIAGNOSIS — I10 Essential (primary) hypertension: Secondary | ICD-10-CM | POA: Insufficient documentation

## 2022-03-26 DIAGNOSIS — Z122 Encounter for screening for malignant neoplasm of respiratory organs: Secondary | ICD-10-CM

## 2022-03-26 DIAGNOSIS — Z803 Family history of malignant neoplasm of breast: Secondary | ICD-10-CM | POA: Insufficient documentation

## 2022-03-26 DIAGNOSIS — Z87891 Personal history of nicotine dependence: Secondary | ICD-10-CM

## 2022-03-26 LAB — TECHNOLOGIST SMEAR REVIEW: Plt Morphology: ADEQUATE

## 2022-03-26 LAB — COMPREHENSIVE METABOLIC PANEL
ALT: 20 U/L (ref 0–44)
AST: 27 U/L (ref 15–41)
Albumin: 4.3 g/dL (ref 3.5–5.0)
Alkaline Phosphatase: 73 U/L (ref 38–126)
Anion gap: 11 (ref 5–15)
BUN: 21 mg/dL (ref 8–23)
CO2: 29 mmol/L (ref 22–32)
Calcium: 9.4 mg/dL (ref 8.9–10.3)
Chloride: 99 mmol/L (ref 98–111)
Creatinine, Ser: 1.05 mg/dL (ref 0.61–1.24)
GFR, Estimated: >60 mL/min (ref >60)
Glucose, Bld: 105 mg/dL — ABNORMAL HIGH (ref 70–99)
Potassium: 3.3 mmol/L — ABNORMAL LOW (ref 3.5–5.1)
Sodium: 139 mmol/L (ref 135–145)
Total Bilirubin: 0.6 mg/dL (ref 0.3–1.2)
Total Protein: 7.2 g/dL (ref 6.5–8.1)

## 2022-03-26 LAB — CBC WITH DIFFERENTIAL/PLATELET
Abs Immature Granulocytes: 0.35 10*3/uL — ABNORMAL HIGH (ref 0.00–0.07)
Basophils Absolute: 0.1 10*3/uL (ref 0.0–0.1)
Basophils Relative: 0 %
Eosinophils Absolute: 0.1 10*3/uL (ref 0.0–0.5)
Eosinophils Relative: 1 %
HCT: 48.7 % (ref 39.0–52.0)
Hemoglobin: 16.2 g/dL (ref 13.0–17.0)
Immature Granulocytes: 2 %
Lymphocytes Relative: 26 %
Lymphs Abs: 5.3 10*3/uL — ABNORMAL HIGH (ref 0.7–4.0)
MCH: 33.3 pg (ref 26.0–34.0)
MCHC: 33.3 g/dL (ref 30.0–36.0)
MCV: 100.2 fL — ABNORMAL HIGH (ref 80.0–100.0)
Monocytes Absolute: 1.6 10*3/uL — ABNORMAL HIGH (ref 0.1–1.0)
Monocytes Relative: 8 %
Neutro Abs: 13.4 10*3/uL — ABNORMAL HIGH (ref 1.7–7.7)
Neutrophils Relative %: 63 %
Platelets: 226 10*3/uL (ref 150–400)
RBC: 4.86 MIL/uL (ref 4.22–5.81)
RDW: 14.5 % (ref 11.5–15.5)
WBC: 20.8 10*3/uL — ABNORMAL HIGH (ref 4.0–10.5)
nRBC: 0.1 % (ref 0.0–0.2)

## 2022-03-26 LAB — RETIC PANEL
Immature Retic Fract: 15 % (ref 2.3–15.9)
RBC.: 4.9 MIL/uL (ref 4.22–5.81)
Retic Count, Absolute: 83.8 10*3/uL (ref 19.0–186.0)
Retic Ct Pct: 1.7 % (ref 0.4–3.1)
Reticulocyte Hemoglobin: 37.7 pg (ref >27.9)

## 2022-03-26 LAB — LACTATE DEHYDROGENASE: LDH: 142 U/L (ref 98–192)

## 2022-03-26 MED ORDER — VITAMIN B-12 1000 MCG PO TABS: 1000.0000 ug | ORAL_TABLET | Freq: Every day | ORAL | 1 refills | Status: AC

## 2022-03-26 NOTE — Assessment & Plan Note (Signed)
Borderline low normal end Vitamin B12 Recommend patient to take Vitamin B12 1000mcg daily.  

## 2022-03-26 NOTE — Assessment & Plan Note (Signed)
Chronic macrocytosis. Borderline B12 level 300s, normal folate level.  DDx B2 deficiency, vs hemolysis,vs underlying bone marrow disorders.  Check cbc, cmp, myeloma panel, retic panel, LDH, smear, haptoglobin.

## 2022-03-26 NOTE — Progress Notes (Signed)
Hematology/Oncology Consult note [phone] [phone]      Patient Care Team: Leone Haven, MD as PCP - General (Family Medicine) Rockey Situ Kathlene November, MD as PCP - Cardiology (Cardiology)   REFERRING PROVIDER: Leone Haven, MD  CHIEF COMPLAINTS/REASON FOR VISIT:  Elevated MCV  ASSESSMENT & PLAN:  Macrocytosis Chronic macrocytosis. Borderline B12 level 300s, normal folate level.  DDx B2 deficiency, vs hemolysis,vs underlying bone marrow disorders.  Check cbc, cmp, myeloma panel, retic panel, LDH, smear, haptoglobin.  Low serum vitamin B12 Borderline low normal end Vitamin B12 Recommend patient to take Vitamin B12 1060mg daily.   Orders Placed This Encounter  Procedures   CBC with Differential/Platelet    Standing Status:   Future    Number of Occurrences:   1    Standing Expiration Date:   03/27/2023   Multiple Myeloma Panel (SPEP&IFE w/QIG)    Standing Status:   Future    Number of Occurrences:   1    Standing Expiration Date:   03/27/2023   Kappa/lambda light chains    Standing Status:   Future    Number of Occurrences:   1    Standing Expiration Date:   03/27/2023   Retic Panel    Standing Status:   Future    Number of Occurrences:   1    Standing Expiration Date:   03/27/2023   Lactate dehydrogenase    Standing Status:   Future    Number of Occurrences:   1    Standing Expiration Date:   03/27/2023   Comprehensive metabolic panel    Standing Status:   Future    Number of Occurrences:   1    Standing Expiration Date:   03/27/2023   Technologist smear review    Standing Status:   Future    Number of Occurrences:   1    Standing Expiration Date:   03/27/2023    Order Specific Question:   Clinical information:    Answer:   macrocytosis   Haptoglobin    Standing Status:   Future    Number of Occurrences:   1    Standing Expiration Date:   03/27/2023   Follow up in 3-4 weeks.  All questions were answered. The patient  knows to call the clinic with any problems, questions or concerns.  ZEarlie Server MD, PhD [facility] H[facility] Oncology 03/26/2022     HISTORY OF PRESENTING ILLNESS:  Karl Myers a  76y.o.  male with PMH listed below who was referred to me for elevated MCV Reviewed patient's recent labs that was done.  He was found to have abnormal CBC on 03/19/22 with normal hemoglobin 15.2, MCV 102.4 Reviewed patient's previous labs ordered by primary care physician's office, anemia is chronic onset , duration is since 2022 He had not noticed any recent bleeding such as epistaxis, hematuria or hematochezia.  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  Denies alcohol use.    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, Stage IV, 0.693mBreslow's   Pre-diabetes     SURGICAL HISTORY: Past Surgical History:  Procedure Laterality Date   BACK SURGERY     x 3   CARPAL TUNNEL RELEASE Right 08/19/2017   Procedure: CARPAL TUNNEL RELEASE;  Surgeon: Hessie Knows, MD;  Location: [facility] ORS;  Service: Orthopedics;  Laterality: Right;   CATARACT EXTRACTION W/PHACO Right 11/17/2016   Procedure: CATARACT EXTRACTION PHACO AND INTRAOCULAR LENS PLACEMENT (IOC);  Surgeon: Birder Robson, MD;  Location: [facility] OR[facility];  Service: Ophthalmology;  Laterality: Right;  [place] 00:37 AP% 12.6 CDE 4.69 Fluid pack lot # [id number]   CATARACT EXTRACTION W/PHACO Left 12/08/2016   Procedure: CATARACT EXTRACTION PHACO AND INTRAOCULAR LENS PLACEMENT (IOC);  Surgeon: Birder Robson, MD;  Location: [facility] OR[facility];  Service: Ophthalmology;  Laterality: Left;  [place] 00:33 AP% 13.9 CDE 4.63 Fluid pack lot # [id number]   TOE  AMPUTATION Left    ULNAR TUNNEL RELEASE Right 08/19/2017   Procedure: CUBITAL TUNNEL RELEASE;  Surgeon: Hessie Knows, MD;  Location: [facility] OR[facility];  Service: Orthopedics;  Laterality: Right;    SOCIAL HISTORY: Social History   Socioeconomic History   Marital status: Married    Spouse name: Not on file   Number of children: Not on file   Years of education: Not on file   Highest education level: Not on file  Occupational History   Not on file  Tobacco Use   Smoking status: Every Day    Packs/day: 0.25    Years: 65.00    Total pack years: 16.25    Types: Cigars, Cigarettes   Smokeless tobacco: Never   Tobacco comments:    0.5PPD 12/03/2021  Vaping Use   Vaping Use: Never used  Substance and Sexual Activity   Alcohol use: No   Drug use: No   Sexual activity: Not on file  Other Topics Concern   Not on file  Social History Narrative   Not on file   Social Determinants of Health   Financial Resource Strain: Low Risk  (03/24/2022)   Overall Financial Resource Strain (CARDIA)    Difficulty of Paying Living Expenses: Not hard at all  Food Insecurity: No Food Insecurity (03/24/2022)   Hunger Vital Sign    Worried About Running Out of Food in the Last Year: Never true    [place] in the Last Year: Never true  Transportation Needs: No Transportation Needs (03/24/2022)   PRAPARE - Hydrologist (Medical): No    Lack of Transportation (Non-Medical): No  Physical Activity: Unknown (02/02/2018)   Exercise Vital Sign    Days of Exercise per Week: 0 days    Minutes of Exercise per Session: Not on file  Stress: No Stress Concern Present (03/24/2022)   [place]    Feeling of Stress : Not at all  Social Connections: Unknown (03/24/2022)   Social Connection and Isolation Panel [NHANES]    Frequency of Communication with Friends and Family: Not on file    Frequency of Social Gatherings  with Friends and Family: Not on file    Attends Religious Services: Not on file    Active Member of Clubs or Organizations: Not on file    Attends Archivist Meetings: Not on file    Marital Status: Married  Intimate Partner Violence: Not At Risk (03/24/2022)   Humiliation, Afraid, Rape, and Kick questionnaire    Fear of Current or Ex-Partner: No    Emotionally Abused: No    Physically Abused: No    Sexually Abused: No  FAMILY HISTORY: Family History  Problem Relation Age of Onset   Heart attack Mother    Prostate cancer Father    Breast cancer Sister 37   Dementia Sister    Cancer Brother        unknown what kind    ALLERGIES:  is allergic to hydrocodone-acetaminophen and neurontin [gabapentin].  MEDICATIONS:  Current Outpatient Medications  Medication Sig Dispense Refill   ALPRAZolam (XANAX) 0.5 MG tablet TAKE 1/2 TABLET BY MOUTH EVERY MORNING AND TAKE 1 TABLET BY MOUTH AT BEDTIME ASDIRECTED 45 tablet 1   amoxicillin-clavulanate (AUGMENTIN) 875-125 MG tablet SMARTSIG:1 Tablet(s) By Mouth Every 12 Hours     atenolol (TENORMIN) 25 MG tablet TAKE ONE TABLET BY MOUTH AT BEDTIME 90 tablet 3   atorvastatin (LIPITOR) 80 MG tablet TAKE 1 TABLET BY MOUTH DAILY. 90 tablet 1   cyanocobalamin (VITAMIN B12) 1000 MCG tablet Take 1 tablet (1,000 mcg total) by mouth daily. 90 tablet 1   ELIQUIS 5 MG TABS tablet TAKE ONE TABLET BY MOUTH TWICE DAILY (Patient taking differently: Take 5 mg by mouth daily.) 90 tablet 3   Fluticasone-Umeclidin-Vilant (TRELEGY ELLIPTA) 100-62.5-25 MCG/ACT AEPB Inhale 1 puff into the lungs daily. 28 each 11   gabapentin (NEURONTIN) 100 MG capsule TAKE 1 CAPSULE BY MOUTH 3 TIMES DAILY 270 capsule 1   lisinopril (ZESTRIL) 5 MG tablet Take 1 tablet (5 mg total) by mouth at bedtime. 90 tablet 3   predniSONE (DELTASONE) 20 MG tablet Take 20 mg by mouth 2 (two) times daily.     Spacer/Aero-Holding Chambers (AEROCHAMBER PLUS) inhaler Use with inhaler 1 each 2    tacrolimus (PROTOPIC) 0.1 % ointment Apply topically 2 (two) times daily. Bid to aa buttocks until resolved 60 g 1   traZODone (DESYREL) 150 MG tablet TAKE ONE TABLET AT BEDTIME 90 tablet 1   nitroGLYCERIN (NITROSTAT) 0.4 MG SL tablet Place 1 tablet (0.4 mg total) under the tongue every 5 (five) minutes as needed for chest pain. For up to 3 doses per episode. (Patient not taking: Reported on 03/25/2022) 50 tablet 0   No current facility-administered medications for this visit.    Review of Systems  Constitutional:  Negative for appetite change, chills, fatigue, fever and unexpected weight change.  HENT:   Negative for hearing loss and voice change.   Eyes:  Negative for eye problems and icterus.  Respiratory:  Negative for chest tightness, cough and shortness of breath.   Cardiovascular:  Negative for chest pain and leg swelling.  Gastrointestinal:  Negative for abdominal distention and abdominal pain.  Endocrine: Negative for hot flashes.  Genitourinary:  Negative for difficulty urinating, dysuria and frequency.   Musculoskeletal:  Negative for arthralgias.       History of melanoma s/p right 5th toe amputation.   Skin:  Negative for itching and rash.  Neurological:  Negative for light-headedness and numbness.  Hematological:  Negative for adenopathy. Does not bruise/bleed easily.  Psychiatric/Behavioral:  Negative for confusion.     PHYSICAL EXAMINATION: ECOG PERFORMANCE STATUS: 1 - Symptomatic but completely ambulatory Vitals:   03/26/22 1109  BP: 131/67  Pulse: 67  Resp: 18  Temp: 98.1 F (36.7 C)   Filed Weights   03/26/22 1109  Weight: 165 lb 1.6 oz (74.9 kg)    Physical Exam Constitutional:      General: He is not in acute distress. HENT:     Head: Normocephalic and atraumatic.  Eyes:     General: No scleral icterus. Cardiovascular:  Rate and Rhythm: Normal rate and regular rhythm.     Heart sounds: Normal heart sounds.  Pulmonary:     Effort: Pulmonary  effort is normal. No respiratory distress.     Breath sounds: No wheezing.  Abdominal:     General: Bowel sounds are normal. There is no distension.     Palpations: Abdomen is soft.  Musculoskeletal:        General: No deformity. Normal range of motion.     Cervical back: Normal range of motion and neck supple.  Skin:    General: Skin is warm and dry.     Findings: No erythema or rash.  Neurological:     Mental Status: He is alert and oriented to person, place, and time. Mental status is at baseline.     Cranial Nerves: No cranial nerve deficit.     Coordination: Coordination normal.  Psychiatric:        Mood and Affect: Mood normal.      LABORATORY DATA:  I have reviewed the data as listed    Latest Ref Rng & Units 03/26/2022   11:53 AM 03/19/2022    9:23 AM 02/03/2022    1:04 PM  CBC  WBC 4.0 - 10.5 K/uL 20.8  8.4  10.5   Hemoglobin 13.0 - 17.0 g/dL 16.2  15.2  15.2   Hematocrit 39.0 - 52.0 % 48.7  45.8  44.1   Platelets 150 - 400 K/uL 226  172.0  181.0       Latest Ref Rng & Units 03/26/2022   11:53 AM 01/05/2022    9:01 AM 07/07/2021    9:01 AM  CMP  Glucose 70 - 99 mg/dL 105  102  108   BUN 8 - 23 mg/dL '21  11  13   ' Creatinine 0.61 - 1.24 mg/dL 1.05  1.02  0.95   Sodium 135 - 145 mmol/L 139  141  140   Potassium 3.5 - 5.1 mmol/L 3.3  4.6  4.6   Chloride 98 - 111 mmol/L 99  102  104   CO2 22 - 32 mmol/L '29  29  29   ' Calcium 8.9 - 10.3 mg/dL 9.4  9.2  8.9   Total Protein 6.5 - 8.1 g/dL 7.2  6.7  6.3   Total Bilirubin 0.3 - 1.2 mg/dL 0.6  0.6  0.6   Alkaline Phos 38 - 126 U/L 73  80  75   AST 15 - 41 U/L '27  19  19   ' ALT 0 - 44 U/L '20  15  15       ' Component Value Date/Time   FERRITIN 469 (H) 07/26/2019 0330     RADIOGRAPHIC STUDIES: I have personally reviewed the radiological images as listed and agreed with the findings in the report. CT CHEST LCS NODULE F/U LOW DOSE WO CONTRAST  Result Date: 03/25/2022 CLINICAL DATA:  61 pack-year smoking  history/current smoker. Short-term follow-up of right lower lobe pulmonary nodule. EXAM: CT CHEST WITHOUT CONTRAST FOR LUNG CANCER SCREENING NODULE FOLLOW-UP TECHNIQUE: Multidetector CT imaging of the chest was performed following the standard protocol without IV contrast. RADIATION DOSE REDUCTION: This exam was performed according to the departmental dose-optimization program which includes automated exposure control, adjustment of the mA and/or kV according to patient size and/or use of iterative reconstruction technique. COMPARISON:  09/09/2021 FINDINGS: Cardiovascular: Aortic atherosclerosis. Tortuous thoracic aorta. Normal heart size, without pericardial effusion. Three vessel coronary artery calcification. Mediastinum/Nodes: No mediastinal or hilar adenopathy,  given limitations of unenhanced CT. Lungs/Pleura: No pleural fluid. Mild centrilobular emphysema. The right lower lobe nodule of interest on the prior has resolved. Mucoid impaction involves the left lower lobe. No change in bilateral pulmonary nodules of maximally volume derived equivalent diameter 4.3 mm. Upper Abdomen: Normal imaged portions of the liver, spleen, stomach, pancreas, gallbladder, left kidney. Low-density right renal lesions of maximally 1.4 cm are likely cysts . In the absence of clinically indicated signs/symptoms require(s) no independent follow-up. Left greater than right adrenal thickening and nodularity are unchanged. Low-density, consistent with underlying small adenomas . In the absence of clinically indicated signs/symptoms require(s) no independent follow-up. Musculoskeletal: No acute osseous abnormality. IMPRESSION: 1. Lung-RADS 2, benign appearance or behavior. Continue annual screening with low-dose chest CT without contrast in 12 months. 2. Aortic Atherosclerosis (ICD10-I70.0) and Emphysema (ICD10-J43.9). Coronary artery atherosclerosis. Electronically Signed   By: Abigail Miyamoto M.D.   On: 03/25/2022 11:06

## 2022-03-27 LAB — KAPPA/LAMBDA LIGHT CHAINS
Kappa free light chain: 13 mg/L (ref 3.3–19.4)
Kappa, lambda light chain ratio: 1.25 (ref 0.26–1.65)
Lambda free light chains: 10.4 mg/L (ref 5.7–26.3)

## 2022-03-27 LAB — HAPTOGLOBIN: Haptoglobin: 137 mg/dL (ref 34–355)

## 2022-03-30 LAB — MULTIPLE MYELOMA PANEL, SERUM
Albumin SerPl Elph-Mcnc: 3.8 g/dL (ref 2.9–4.4)
Albumin/Glob SerPl: 1.5 (ref 0.7–1.7)
Alpha 1: 0.3 g/dL (ref 0.0–0.4)
Alpha2 Glob SerPl Elph-Mcnc: 0.7 g/dL (ref 0.4–1.0)
B-Globulin SerPl Elph-Mcnc: 0.9 g/dL (ref 0.7–1.3)
Gamma Glob SerPl Elph-Mcnc: 0.7 g/dL (ref 0.4–1.8)
Globulin, Total: 2.6 g/dL (ref 2.2–3.9)
IgA: 83 mg/dL (ref 61–437)
IgG (Immunoglobin G), Serum: 728 mg/dL (ref 603–1613)
IgM (Immunoglobulin M), Srm: 56 mg/dL (ref 15–143)
Total Protein ELP: 6.4 g/dL (ref 6.0–8.5)

## 2022-04-03 ENCOUNTER — Encounter: Payer: Self-pay | Admitting: Dermatology

## 2022-04-09 ENCOUNTER — Ambulatory Visit: Payer: PPO | Admitting: Dermatology

## 2022-04-15 ENCOUNTER — Telehealth: Payer: Self-pay

## 2022-04-15 ENCOUNTER — Ambulatory Visit: Payer: PPO | Admitting: Dermatology

## 2022-04-15 DIAGNOSIS — Z5111 Encounter for antineoplastic chemotherapy: Secondary | ICD-10-CM | POA: Diagnosis not present

## 2022-04-15 DIAGNOSIS — L853 Xerosis cutis: Secondary | ICD-10-CM | POA: Diagnosis not present

## 2022-04-15 DIAGNOSIS — B353 Tinea pedis: Secondary | ICD-10-CM

## 2022-04-15 DIAGNOSIS — L578 Other skin changes due to chronic exposure to nonionizing radiation: Secondary | ICD-10-CM | POA: Diagnosis not present

## 2022-04-15 DIAGNOSIS — L820 Inflamed seborrheic keratosis: Secondary | ICD-10-CM | POA: Diagnosis not present

## 2022-04-15 DIAGNOSIS — L570 Actinic keratosis: Secondary | ICD-10-CM

## 2022-04-15 DIAGNOSIS — B351 Tinea unguium: Secondary | ICD-10-CM | POA: Diagnosis not present

## 2022-04-15 DIAGNOSIS — L814 Other melanin hyperpigmentation: Secondary | ICD-10-CM

## 2022-04-15 DIAGNOSIS — Z1283 Encounter for screening for malignant neoplasm of skin: Secondary | ICD-10-CM | POA: Diagnosis not present

## 2022-04-15 DIAGNOSIS — Z79899 Other long term (current) drug therapy: Secondary | ICD-10-CM

## 2022-04-15 DIAGNOSIS — D692 Other nonthrombocytopenic purpura: Secondary | ICD-10-CM | POA: Diagnosis not present

## 2022-04-15 DIAGNOSIS — D229 Melanocytic nevi, unspecified: Secondary | ICD-10-CM

## 2022-04-15 DIAGNOSIS — Z85820 Personal history of malignant melanoma of skin: Secondary | ICD-10-CM | POA: Diagnosis not present

## 2022-04-15 MED ORDER — TERBINAFINE HCL 250 MG PO TABS: 250.0000 mg | ORAL_TABLET | Freq: Every day | ORAL | 0 refills | Status: DC

## 2022-04-15 MED ORDER — FLUOROURACIL 5 % EX CREA: TOPICAL_CREAM | Freq: Two times a day (BID) | CUTANEOUS | 2 refills | Status: DC

## 2022-04-15 NOTE — Telephone Encounter (Signed)
Pts wife called stating that the pt went to a walk in clinic about a month ago and was Dx with bronchitis.  She states that the pt has not gotten any better and that he still has a cough and seems very tired.   We did not have any openings and pts wife stated [place] was too far.  I suggested the pt go to [facility] walk in [facility] so an xray could be done and he can be evaluated today.  Pts wife verbalized understanding and stated that's where they went the last time and would go back to the walk in.

## 2022-04-15 NOTE — Progress Notes (Signed)
Follow-Up Visit   Subjective  Karl Myers is a 76 y.o. male who presents for the following: Total body skin exam (Hx of Melanoma R 5th toe lat tip, excised 2021, hx of AKs) and check spot (R ear, just noticed today, no symptoms). The patient presents for Total-Body Skin Exam (TBSE) for skin cancer screening and mole check.  The patient has spots, moles and lesions to be evaluated, some may be new or changing and the patient has concerns that these could be cancer.   The following portions of the chart were reviewed this encounter and updated as appropriate:   Tobacco  Allergies  Meds  Problems  Med Hx  Surg Hx  Fam Hx     Review of Systems:  No other skin or systemic complaints except as noted in HPI or Assessment and Plan.  Objective  Well appearing patient in no apparent distress; mood and affect are within normal limits.  A full examination was performed including scalp, head, eyes, ears, nose, lips, neck, chest, axillae, abdomen, back, buttocks, bilateral upper extremities, bilateral lower extremities, hands, feet, fingers, toes, fingernails, and toenails. All findings within normal limits unless otherwise noted below.  R 5th toe lat tip Well healed scar with no evidence of recurrence, no lymphadenopathy.   face x 16 (16) Pink scaly macules  Scalp x 3 (3) Stuck on waxy paps with erythema  bil feet Scaling feet, toenail dystrophy   Assessment & Plan   Lentigines - Scattered tan macules - Due to sun exposure - Benign-appearing, observe - Recommend daily broad spectrum sunscreen SPF 30+ to sun-exposed areas, reapply every 2 hours as needed. - Call for any changes  Seborrheic Keratoses - Stuck-on, waxy, tan-brown papules and/or plaques  - Benign-appearing - Discussed benign etiology and prognosis. - Observe - Call for any changes  Melanocytic Nevi - Tan-brown and/or pink-flesh-colored symmetric macules and papules - Benign appearing on exam today -  Observation - Call clinic for new or changing moles - Recommend daily use of broad spectrum spf 30+ sunscreen to sun-exposed areas.   Hemangiomas - Red papules - Discussed benign nature - Observe - Call for any changes  Actinic Damage with PreCancerous Actinic Keratoses Counseling for Topical Chemotherapy Management: Patient exhibits: - Severe, confluent actinic changes with pre-cancerous actinic keratoses that is secondary to cumulative UV radiation exposure over time - Condition that is severe; chronic, not at goal. - diffuse scaly erythematous macules and papules with underlying dyspigmentation - Discussed Prescription "Field Treatment" topical Chemotherapy for Severe, Chronic Confluent Actinic Changes with Pre-Cancerous Actinic Keratoses Field treatment involves treatment of an entire area of skin that has confluent Actinic Changes (Sun/ Ultraviolet light damage) and PreCancerous Actinic Keratoses by method of PhotoDynamic Therapy (PDT) and/or prescription Topical Chemotherapy agents such as 5-fluorouracil, 5-fluorouracil/calcipotriene, and/or imiquimod.  The purpose is to decrease the number of clinically evident and subclinical PreCancerous lesions to prevent progression to development of skin cancer by chemically destroying early precancer changes that may or may not be visible.  It has been shown to reduce the risk of developing skin cancer in the treated area. As a result of treatment, redness, scaling, crusting, and open sores may occur during treatment course. One or more than one of these methods may be used and may have to be used several times to control, suppress and eliminate the PreCancerous changes. Discussed treatment course, expected reaction, and possible side effects. - Recommend daily broad spectrum sunscreen SPF 30+ to sun-exposed areas, reapply every 2  hours as needed.  - Staying in the shade or wearing long sleeves, sun glasses (UVA+UVB protection) and wide brim hats  (4-inch brim around the entire circumference of the hat) are also recommended. - Call for new or changing lesions.  -- On May 01, 2022, Start 5-fluorouracil/calcipotriene cream twice a day for 7 days to affected areas including forehead, temples. Prescription sent to [facility]. Patient advised they will receive an email to purchase the medication online and have it sent to their home. Patient provided with handout reviewing treatment course and side effects and advised to call or message [place] on MyChart with any concerns.  Reviewed course of treatment and expected reaction.  Patient advised to expect inflammation and crusting and advised that erosions are possible.  Patient advised to be diligent with sun protection during and after treatment. Counseled to keep medication out of reach of children and pets.   Skin cancer screening performed today.   History of melanoma R 5th toe lat tip Excised 09/2019 Clear. Observe for recurrence. Call clinic for new or changing lesions.  Recommend regular skin exams, daily broad-spectrum spf 30+ sunscreen use, and photoprotection.    AK (actinic keratosis) (16) face x 16 fluorouracil (EFUDEX) 5 % cream - face x 16 Apply topically 2 (two) times daily. Starting on May 01, 2022 apply bid to forehead and temples for 7 days  Destruction of lesion - face x 16 Complexity: simple   Destruction method: cryotherapy   Informed consent: discussed and consent obtained   Timeout:  patient name, date of birth, surgical site, and procedure verified Lesion destroyed using liquid nitrogen: Yes   Region frozen until ice ball extended beyond lesion: Yes   Outcome: patient tolerated procedure well with no complications   Post-procedure details: wound care instructions given    Inflamed seborrheic keratosis (3) Scalp x 3 Symptomatic, irritating, patient would like treated. 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 The pill version is often used to treat fungal infections of the nails or scalp. While most people do not have any side effects from taking terbinafine pills, some possible side effects of the medicine can include taste changes, headache, loss of smell, vision changes, nausea, vomiting, or diarrhea.   Rare side effects can include irritation of the liver, allergic reaction, or decrease in blood counts (which may show up as not feeling well or developing an infection). If you are concerned about any of these side effects, please stop the medicine and call your doctor, or in the case of an emergency such as feeling very unwell, seek immediate medical care.    terbinafine (LAMISIL) 250 MG tablet - bil feet Take 1 tablet (250 mg total) by mouth daily.  Purpura - Chronic; persistent and recurrent.  Treatable, but not curable. - Violaceous macules and patches - Benign - Related to trauma, age, sun damage and/or use of blood thinners, chronic use of topical and/or oral steroids - Observe - Can use OTC arnica containing moisturizer such as Dermend Bruise Formula if desired - Call for worsening or other concerns   Xerosis - diffuse xerotic patches - recommend gentle, hydrating  skin care - gentle skin care handout given  - recommend Cerave cream  Return in about 6 months (around 10/14/2022) for TBSE, Hx of Melanoma, Hx of AKs.  I, [name], RMA, am acting as scribe for [name], MD . Documentation: I have reviewed the above documentation for accuracy and completeness, and I agree with the above.  [name], MD

## 2022-04-15 NOTE — Patient Instructions (Addendum)
-- On May 01, 2022, Start 5-fluorouracil/calcipotriene cream twice a day for 7 days to affected areas including forehead, temples. Prescription sent to [facility]. Patient advised they will receive an email to purchase the medication online and have it sent to their home. Patient provided with handout reviewing treatment course and side effects and advised to call or message [place] on MyChart with any concerns.  Instructions for [facility] Medications  One or more of your medications was sent to the [facility]. You will receive an email from them and can purchase the medicine through that link. It will then be mailed to your home at the address you confirmed. If for any reason you do not receive an email from them, please check your spam folder. If you still do not find the email, please let us know. [facility] phone number is [phone].     5-Fluorouracil/Calcipotriene Patient Education   Actinic keratoses are the dry, red scaly spots on the skin caused by sun damage. A portion of these spots can turn into skin cancer with time, and treating them can help prevent development of skin cancer.   Treatment of these spots requires removal of the defective skin cells. There are various ways to remove actinic keratoses, including freezing with liquid nitrogen, treatment with creams, or treatment with a blue light procedure in the office.   5-fluorouracil cream is a topical cream used to treat actinic keratoses. It works by interfering with the growth of abnormal fast-growing skin cells, such as actinic keratoses. These cells peel off and are replaced by healthy ones.   5-fluorouracil/calcipotriene is a combination of the 5-fluorouracil cream with a vitamin D analog cream called calcipotriene. The calcipotriene alone does not treat actinic keratoses. However, when it is combined with 5-fluorouracil, it helps the 5-fluorouracil  treat the actinic keratoses much faster so that the same results can be achieved with a much shorter treatment time.  INSTRUCTIONS FOR 5-FLUOROURACIL/CALCIPOTRIENE CREAM:   5-fluorouracil/calcipotriene cream typically only needs to be used for 4-7 days. A thin layer should be applied twice a day to the treatment areas recommended by your physician.   If your physician prescribed you separate tubes of 5-fluourouracil and calcipotriene, apply a thin layer of 5-fluorouracil followed by a thin layer of calcipotriene.   Avoid contact with your eyes, nostrils, and mouth. Do not use 5-fluorouracil/calcipotriene cream on infected or open wounds.   You will develop redness, irritation and some crusting at areas where you have pre-cancer damage/actinic keratoses. IF YOU DEVELOP PAIN, BLEEDING, OR SIGNIFICANT CRUSTING, STOP THE TREATMENT EARLY - you have already gotten a good response and the actinic keratoses should clear up well.  Wash your hands after applying 5-fluorouracil 5% cream on your skin.   A moisturizer or sunscreen with a minimum SPF 30 should be applied each morning.   Once you have finished the treatment, you can apply a thin layer of Vaseline twice a day to irritated areas to soothe and calm the areas more quickly. If you experience significant discomfort, contact your physician.  For some patients it is necessary to repeat the treatment for best results.  SIDE EFFECTS: When using 5-fluorouracil/calcipotriene cream, you may have mild irritation, such as redness, dryness, swelling, or a mild burning sensation. This usually resolves within 2 weeks. The more actinic keratoses you have, the more redness and inflammation you can expect during treatment. Eye irritation has been reported rarely. If this occurs, please let us know.  If you have any trouble using this cream, please call the office. If you have any other questions about this information, please do not hesitate to ask me before you  leave the office.   For Feet Restart Ketoconazole 2% cream nightly to feet Start Lamisil '250mg'$  1 pill a days for a month  Terbinafine Counseling  Terbinafine is an anti-fungal medicine that can be applied to the skin (over the counter) or taken by mouth (prescription) to treat fungal infections. The pill version is often used to treat fungal infections of the nails or scalp. While most people do not have any side effects from taking terbinafine pills, some possible side effects of the medicine can include taste changes, headache, loss of smell, vision changes, nausea, vomiting, or diarrhea.   Rare side effects can include irritation of the liver, allergic reaction, or decrease in blood counts (which may show up as not feeling well or developing an infection). If you are concerned about any of these side effects, please stop the medicine and call your doctor, or in the case of an emergency such as feeling very unwell, seek immediate medical care.    Due to recent changes in healthcare laws, you may see results of your pathology and/or laboratory studies on MyChart before the doctors have had a chance to review them. We understand that in some cases there may be results that are confusing or concerning to you. Please understand that not all results are received at the same time and often the doctors may need to interpret multiple results in order to provide you with the best plan of care or course of treatment. Therefore, we ask that you please give [place] 2 business days to thoroughly review all your results before contacting the office for clarification. Should we see a critical lab result, you will be contacted sooner.   If You Need Anything After Your Visit  If you have any questions or concerns for your doctor, please call our main line at [phone] and press option 4 to reach your doctor's medical assistant. If no one answers, please leave a voicemail as directed and we will return your call as  soon as possible. Messages left after 4 pm will be answered the following business day.   You may also send [place] a message via [place]. We typically respond to MyChart messages within 1-2 business days.  For prescription refills, please ask your pharmacy to contact our office. Our fax number is [phone].  If you have an urgent issue when the clinic is closed that cannot wait until the next business day, you can page your doctor at the number below.    Please note that while we do our best to be available for urgent issues outside of office hours, we are not available 24/7.   If you have an urgent issue and are unable to reach [place], you may choose to seek medical care at your doctor's office, retail clinic, urgent care center, or emergency room.  If you have a medical emergency, please immediately call 911 or go to the emergency department.  Pager Numbers  - Dr. Nehemiah Massed: [phone]  - Dr. Laurence Ferrari: [phone]  - Dr. Nicole Kindred: [phone]  In the event of inclement weather, please call our main line at [phone] for an update on the status of any delays or closures.  Dermatology Medication Tips: Please keep the boxes that topical medications come in in order to help keep track of the instructions about where and how to  use these. Pharmacies typically print the medication instructions only on the boxes and not directly on the medication tubes.   If your medication is too expensive, please contact our office at [phone] option 4 or send [place] a message through [place].   We are unable to tell what your co-pay for medications will be in advance as this is different depending on your insurance coverage. However, we may be able to find a substitute medication at lower cost or fill out paperwork to get insurance to cover a needed medication.   If a prior authorization is required to get your medication covered by your insurance company, please allow [place] 1-2 business days to complete this  process.  Drug prices often vary depending on where the prescription is filled and some pharmacies may offer cheaper prices.  The website www.goodrx.com contains coupons for medications through different pharmacies. The prices here do not account for what the cost may be with help from insurance (it may be cheaper with your insurance), but the website can give you the price if you did not use any insurance.  - You can print the associated coupon and take it with your prescription to the pharmacy.  - You may also stop by our office during regular business hours and pick up a GoodRx coupon card.  - If you need your prescription sent electronically to a different pharmacy, notify our office through [facility] or by phone at [phone] option 4.     Si Usted Necesita Algo Despus de Su Visita  Tambin puede enviarnos un mensaje a travs de Pharmacist, community. Por lo general respondemos a los mensajes de MyChart en el transcurso de 1 a 2 das hbiles.  Para renovar recetas, por favor pida a su farmacia que se ponga en contacto con nuestra oficina. Harland Dingwall de fax es [place] [phone].  Si tiene un asunto urgente cuando la clnica est cerrada y que no puede esperar hasta el siguiente da hbil, puede llamar/localizar a su doctor(a) al nmero que aparece a continuacin.   Por favor, tenga en cuenta que aunque hacemos todo lo posible para estar disponibles para asuntos urgentes fuera del horario de [place], no estamos disponibles las 24 horas del da, los 7 das de la [place].   Si tiene un problema urgente y no puede comunicarse con nosotros, puede optar por buscar atencin mdica  en el consultorio de su doctor(a), en una clnica privada, en un centro de atencin urgente o en una sala de emergencias.  Si tiene Engineering geologist, por favor llame inmediatamente al 911 o vaya a la sala de emergencias.  Nmeros de bper  - Dr. Nehemiah Massed: [phone]  - Dra. Moye: [phone]  - Dra.  Nicole Kindred: [phone]  En caso de inclemencias del [place], por favor llame a Johnsie Kindred principal al [phone] para una actualizacin sobre el [place] de cualquier retraso o cierre.  Consejos para la medicacin en dermatologa: Por favor, guarde las cajas en las que vienen los medicamentos de uso tpico para ayudarle a seguir las instrucciones sobre dnde y cmo usarlos. Las farmacias generalmente imprimen las instrucciones del medicamento slo en las cajas y no directamente en los tubos del [place].   Si su medicamento es muy caro, por favor, pngase en contacto con Zigmund Daniel llamando al [phone] y presione la opcin 4 o envenos un mensaje a travs de Pharmacist, community.   No podemos decirle cul ser su copago por los medicamentos por adelantado ya que esto es diferente dependiendo de  la cobertura de su seguro. Sin embargo, es posible que podamos encontrar un medicamento sustituto a Electrical engineer un formulario para que el seguro cubra el medicamento que se considera necesario.   Si se requiere una autorizacin previa para que su compaa de seguros [place] su medicamento, por favor permtanos de 1 a 2 das hbiles para completar este proceso.  Los precios de los medicamentos varan con frecuencia dependiendo del Environmental consultant de dnde se surte la receta y alguna farmacias pueden ofrecer precios ms baratos.  El sitio web www.goodrx.com tiene cupones para medicamentos de Airline pilot. Los precios aqu no tienen en cuenta lo que podra costar con la ayuda del seguro (puede ser ms barato con su seguro), pero el sitio web puede darle el precio si no utiliz Research scientist (physical sciences).  - Puede imprimir el cupn correspondiente y llevarlo con su receta a la farmacia.  - Tambin puede pasar por nuestra oficina durante el horario de atencin regular y Charity fundraiser una tarjeta de cupones de GoodRx.  - Si necesita que su receta se enve electrnicamente a una farmacia diferente, informe a nuestra oficina a  travs de MyChart de [place] o por telfono llamando al [phone] y presione la opcin 4.

## 2022-04-16 DIAGNOSIS — J0100 Acute maxillary sinusitis, unspecified: Secondary | ICD-10-CM | POA: Diagnosis not present

## 2022-04-16 DIAGNOSIS — J441 Chronic obstructive pulmonary disease with (acute) exacerbation: Secondary | ICD-10-CM | POA: Diagnosis not present

## 2022-04-24 ENCOUNTER — Encounter: Payer: Self-pay | Admitting: Dermatology

## 2022-05-07 ENCOUNTER — Inpatient Hospital Stay: Payer: PPO | Attending: Oncology | Admitting: Oncology

## 2022-05-07 ENCOUNTER — Encounter: Payer: Self-pay | Admitting: Oncology

## 2022-05-07 VITALS — BP 156/89 | HR 58 | Temp 97.9°F | Wt 163.6 lb

## 2022-05-07 DIAGNOSIS — Z85820 Personal history of malignant melanoma of skin: Secondary | ICD-10-CM | POA: Diagnosis not present

## 2022-05-07 DIAGNOSIS — Z8042 Family history of malignant neoplasm of prostate: Secondary | ICD-10-CM | POA: Insufficient documentation

## 2022-05-07 DIAGNOSIS — Z803 Family history of malignant neoplasm of breast: Secondary | ICD-10-CM | POA: Insufficient documentation

## 2022-05-07 DIAGNOSIS — E538 Deficiency of other specified B group vitamins: Secondary | ICD-10-CM

## 2022-05-07 DIAGNOSIS — I10 Essential (primary) hypertension: Secondary | ICD-10-CM | POA: Insufficient documentation

## 2022-05-07 DIAGNOSIS — F17210 Nicotine dependence, cigarettes, uncomplicated: Secondary | ICD-10-CM | POA: Insufficient documentation

## 2022-05-07 DIAGNOSIS — D7589 Other specified diseases of blood and blood-forming organs: Secondary | ICD-10-CM | POA: Diagnosis not present

## 2022-05-08 NOTE — Assessment & Plan Note (Signed)
Borderline low normal end Vitamin B12 Recommend patient to take Vitamin B12 1029mg daily.

## 2022-05-08 NOTE — Assessment & Plan Note (Addendum)
Chronic macrocytosis.  DDx B2 deficiency, vs hemolysis,vs underlying bone marrow disorders.  Labs reviewed and discussed with patient.  No evidence of hemolysis, normal multiple myeloma workup Borderline low B12 may contribute to chronic macrocytosis.   Patient is asymptomatic and macrocytosis is very mild.  I will hold off bone marrow biopsy Recommend observation.

## 2022-05-08 NOTE — Progress Notes (Signed)
Hematology/Oncology Consult note [phone] [phone]      Patient Care Team: Leone Haven, MD as PCP - General (Family Medicine) Rockey Situ Kathlene November, MD as PCP - Cardiology (Cardiology)   REFERRING PROVIDER: Leone Haven, MD  CHIEF COMPLAINTS/REASON FOR VISIT:  Elevated MCV  ASSESSMENT & PLAN:  Macrocytosis Chronic macrocytosis.  DDx B2 deficiency, vs hemolysis,vs underlying bone marrow disorders.  Labs reviewed and discussed with patient.  No evidence of hemolysis, normal multiple myeloma workup Borderline low B12 may contribute to chronic macrocytosis.   Patient is asymptomatic and macrocytosis is very mild.  I will hold off bone marrow biopsy Recommend observation.   Low serum vitamin B12 Borderline low normal end Vitamin B12 Recommend patient to take Vitamin B12 1035mg daily.   Orders Placed This Encounter  Procedures   CBC with Differential/Platelet    Standing Status:   Future    Standing Expiration Date:   05/08/2023   Comprehensive metabolic panel    Standing Status:   Future    Standing Expiration Date:   05/07/2023   Vitamin B12    Standing Status:   Future    Standing Expiration Date:   05/08/2023   Folate    Standing Status:   Future    Standing Expiration Date:   05/08/2023   Follow up in 6 months All questions were answered. The patient knows to call the clinic with any problems, questions or concerns.  ZEarlie Server MD, PhD [facility] Hematology Oncology 05/07/2022     HISTORY OF PRESENTING ILLNESS:  Karl Archeyis a  76y.o.  male with PMH listed below who was referred to me for elevated MCV Reviewed patient's recent labs that was done.  He was found to have abnormal CBC on 03/19/22 with normal hemoglobin 15.2, MCV 102.4 Reviewed patient's previous labs ordered by primary care physician's office, anemia is chronic onset , duration is since 2022 He had not noticed any recent bleeding such as epistaxis,  hematuria or hematochezia.  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  Denies alcohol use.   INTERVAL HISTORY [name] a 76y.o. male who has above history reviewed by me today presents for follow up visit for macrocytosis.  Patient takes vitamin B12 supplementation.  He has no new complaints.   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, Stage IV, 0.657mBreslow's   Pre-diabetes     SURGICAL HISTORY: Past Surgical History:  Procedure Laterality Date   BACK SURGERY     x 3   CARPAL TUNNEL RELEASE Right 08/19/2017   Procedure: CARPAL TUNNEL RELEASE;  Surgeon: MeHessie KnowsMD;  Location: [facility] ORS;  Service: Orthopedics;  Laterality: Right;   CATARACT EXTRACTION W/PHACO Right 11/17/2016   Procedure: CATARACT EXTRACTION PHACO AND INTRAOCULAR LENS PLACEMENT (IOC);  Surgeon: PoBirder RobsonMD;  Location: [facility] ORS;  Service: Ophthalmology;  Laterality: Right;  [place]0:37 AP% 12.6 CDE 4.69 Fluid pack lot # [id number]   CATARACT EXTRACTION W/PHACO Left 12/08/2016   Procedure: CATARACT EXTRACTION PHACO AND INTRAOCULAR LENS PLACEMENT (IOC);  Surgeon: PoBirder RobsonMD;  Location: [facility] OR[facility];  Service: Ophthalmology;  Laterality: Left;  [place] 00:33 AP% 13.9 CDE 4.63 Fluid pack lot # [id number]   TOE AMPUTATION Left    ULNAR TUNNEL RELEASE Right 08/19/2017   Procedure: CUBITAL TUNNEL RELEASE;  Surgeon: Hessie Knows, MD;  Location: [facility] ORS;  Service: Orthopedics;  Laterality: Right;    SOCIAL HISTORY: Social History   Socioeconomic History   Marital status: Married    Spouse name: Not on file   Number of children: Not on file   Years of education: Not on file    Highest education level: Not on file  Occupational History   Not on file  Tobacco Use   Smoking status: Every Day    Packs/day: 0.25    Years: 65.00    Total pack years: 16.25    Types: Cigars, Cigarettes   Smokeless tobacco: Never   Tobacco comments:    0.5PPD 12/03/2021  Vaping Use   Vaping Use: Never used  Substance and Sexual Activity   Alcohol use: No   Drug use: No   Sexual activity: Not on file  Other Topics Concern   Not on file  Social History Narrative   Not on file   Social Determinants of Health   Financial Resource Strain: Low Risk  (03/24/2022)   Overall Financial Resource Strain (CARDIA)    Difficulty of Paying Living Expenses: Not hard at all  Food Insecurity: No Food Insecurity (03/24/2022)   Hunger Vital Sign    Worried About Running Out of Food in the Last Year: Never true    [place] in the Last Year: Never true  Transportation Needs: No Transportation Needs (03/24/2022)   PRAPARE - Hydrologist (Medical): No    Lack of Transportation (Non-Medical): No  Physical Activity: Unknown (02/02/2018)   Exercise Vital Sign    Days of Exercise per Week: 0 days    Minutes of Exercise per Session: Not on file  Stress: No Stress Concern Present (03/24/2022)   [place]    Feeling of Stress : Not at all  Social Connections: Unknown (03/24/2022)   Social Connection and Isolation Panel [NHANES]    Frequency of Communication with Friends and Family: Not on file    Frequency of Social Gatherings with Friends and Family: Not on file    Attends Religious Services: Not on file    Active Member of Clubs or Organizations: Not on file    Attends Archivist Meetings: Not on file    Marital Status: Married  Intimate Partner Violence: Not At Risk (03/24/2022)   Humiliation, Afraid, Rape, and Kick questionnaire    Fear of Current or Ex-Partner: No    Emotionally  Abused: No    Physically Abused: No    Sexually Abused: No    FAMILY HISTORY: Family History  Problem Relation Age of Onset   Heart attack Mother    Prostate cancer Father    Breast cancer Sister 39   Dementia Sister    Cancer Brother        unknown what kind    ALLERGIES:  is allergic to hydrocodone-acetaminophen and neurontin [gabapentin].  MEDICATIONS:  Current Outpatient Medications  Medication Sig Dispense Refill   ALPRAZolam (XANAX) 0.5 MG tablet TAKE 1/2 TABLET BY MOUTH EVERY MORNING AND TAKE 1 TABLET BY MOUTH AT BEDTIME ASDIRECTED 45 tablet 1   atenolol (TENORMIN) 25 MG tablet TAKE ONE TABLET BY MOUTH AT BEDTIME 90  tablet 3   atorvastatin (LIPITOR) 80 MG tablet TAKE 1 TABLET BY MOUTH DAILY. 90 tablet 1   cyanocobalamin (VITAMIN B12) 1000 MCG tablet Take 1 tablet (1,000 mcg total) by mouth daily. 90 tablet 1   ELIQUIS 5 MG TABS tablet TAKE ONE TABLET BY MOUTH TWICE DAILY (Patient taking differently: Take 5 mg by mouth daily.) 90 tablet 3   fluorouracil (EFUDEX) 5 % cream Apply topically 2 (two) times daily. Starting on May 01, 2022 apply bid to forehead and temples for 7 days 30 g 2   Fluticasone-Umeclidin-Vilant (TRELEGY ELLIPTA) 100-62.5-25 MCG/ACT AEPB Inhale 1 puff into the lungs daily. 28 each 11   gabapentin (NEURONTIN) 100 MG capsule TAKE 1 CAPSULE BY MOUTH 3 TIMES DAILY 270 capsule 1   lisinopril (ZESTRIL) 5 MG tablet Take 1 tablet (5 mg total) by mouth at bedtime. 90 tablet 3   promethazine-dextromethorphan (PROMETHAZINE-DM) 6.25-15 MG/5ML syrup Take by mouth.     Spacer/Aero-Holding Chambers (AEROCHAMBER PLUS) inhaler Use with inhaler 1 each 2   tacrolimus (PROTOPIC) 0.1 % ointment Apply topically 2 (two) times daily. Bid to aa buttocks until resolved 60 g 1   terbinafine (LAMISIL) 250 MG tablet Take 1 tablet (250 mg total) by mouth daily. 30 tablet 0   traZODone (DESYREL) 150 MG tablet TAKE ONE TABLET AT BEDTIME 90 tablet 1   amoxicillin-clavulanate  (AUGMENTIN) 875-125 MG tablet SMARTSIG:1 Tablet(s) By Mouth Every 12 Hours (Patient not taking: Reported on 05/07/2022)     nitroGLYCERIN (NITROSTAT) 0.4 MG SL tablet Place 1 tablet (0.4 mg total) under the tongue every 5 (five) minutes as needed for chest pain. For up to 3 doses per episode. (Patient not taking: Reported on 03/25/2022) 50 tablet 0   predniSONE (DELTASONE) 20 MG tablet Take 20 mg by mouth 2 (two) times daily. (Patient not taking: Reported on 05/07/2022)     No current facility-administered medications for this visit.    Review of Systems  Constitutional:  Negative for appetite change, chills, fatigue, fever and unexpected weight change.  HENT:   Negative for hearing loss and voice change.   Eyes:  Negative for eye problems and icterus.  Respiratory:  Negative for chest tightness, cough and shortness of breath.   Cardiovascular:  Negative for chest pain and leg swelling.  Gastrointestinal:  Negative for abdominal distention and abdominal pain.  Endocrine: Negative for hot flashes.  Genitourinary:  Negative for difficulty urinating, dysuria and frequency.   Musculoskeletal:  Negative for arthralgias.       History of melanoma s/p right 5th toe amputation.   Skin:  Negative for itching and rash.  Neurological:  Negative for light-headedness and numbness.  Hematological:  Negative for adenopathy. Does not bruise/bleed easily.  Psychiatric/Behavioral:  Negative for confusion.     PHYSICAL EXAMINATION: ECOG PERFORMANCE STATUS: 1 - Symptomatic but completely ambulatory Vitals:   05/07/22 1434  BP: (!) 156/89  Pulse: (!) 58  Temp: 97.9 F (36.6 C)  SpO2: 98%   Filed Weights   05/07/22 1434  Weight: 74.2 kg    Physical Exam Constitutional:      General: He is not in acute distress. HENT:     Head: Normocephalic and atraumatic.  Eyes:     General: No scleral icterus. Cardiovascular:     Rate and Rhythm: Normal rate and regular rhythm.     Heart sounds: Normal  heart sounds.  Pulmonary:     Effort: Pulmonary effort is normal. No respiratory distress.  Breath sounds: No wheezing.  Abdominal:     General: Bowel sounds are normal. There is no distension.     Palpations: Abdomen is soft.  Musculoskeletal:        General: No deformity. Normal range of motion.     Cervical back: Normal range of motion and neck supple.  Skin:    General: Skin is warm and dry.     Findings: No erythema or rash.  Neurological:     Mental Status: He is alert and oriented to person, place, and time. Mental status is at baseline.     Cranial Nerves: No cranial nerve deficit.     Coordination: Coordination normal.  Psychiatric:        Mood and Affect: Mood normal.      LABORATORY DATA:  I have reviewed the data as listed    Latest Ref Rng & Units 03/26/2022   11:53 AM 03/19/2022    9:23 AM 02/03/2022    1:04 PM  CBC  WBC 4.0 - 10.5 K/uL 20.8  8.4  10.5   Hemoglobin 13.0 - 17.0 g/dL 16.2  15.2  15.2   Hematocrit 39.0 - 52.0 % 48.7  45.8  44.1   Platelets 150 - 400 K/uL 226  172.0  181.0       Latest Ref Rng & Units 03/26/2022   11:53 AM 01/05/2022    9:01 AM 07/07/2021    9:01 AM  CMP  Glucose 70 - 99 mg/dL 105  102  108   BUN 8 - 23 mg/dL _0 Creatinine 0.61 - 1.24 mg/dL 1.05  1.02  0.95   Sodium 135 - 145 mmol/L 139  141  140   Potassium 3.5 - 5.1 mmol/L 3.3  4.6  4.6   Chloride 98 - 111 mmol/L 99  102  104   CO2 22 - 32 mmol/L _1 Calcium 8.9 - 10.3 mg/dL 9.4  9.2  8.9   Total Protein 6.5 - 8.1 g/dL 7.2  6.7  6.3   Total Bilirubin 0.3 - 1.2 mg/dL 0.6  0.6  0.6   Alkaline Phos 38 - 126 U/L 73  80  75   AST 15 - 41 U/L _2 ALT 0 - 44 U/L _3 Component Value Date/Time   FERRITIN 469 (H) 07/26/2019 0330     RADIOGRAPHIC STUDIES: I have personally reviewed the radiological images as listed and agreed with the findings in the report. No results found.

## 2022-06-04 ENCOUNTER — Ambulatory Visit: Payer: PPO | Admitting: Dermatology

## 2022-06-04 DIAGNOSIS — R21 Rash and other nonspecific skin eruption: Secondary | ICD-10-CM

## 2022-06-04 MED ORDER — DUODERM CGF DRESSING EX MISC: CUTANEOUS | 0 refills | Status: DC

## 2022-06-04 MED ORDER — MUPIROCIN 2 % EX OINT: TOPICAL_OINTMENT | CUTANEOUS | 0 refills | Status: DC

## 2022-06-04 NOTE — Progress Notes (Signed)
   Follow-Up Visit   Subjective  Karl Myers is a 76 y.o. male who presents for the following: Follow-up (Patient was seen in October for ulcer at left buttock. Dr. Nehemiah Massed prescribed protopic and patient advises he is applying 2-3 times daily but it continues to ooze and has gotten larger. ).  Patient advises the area is painful, no blisters. Patient also advises he does not sit down a lot. He is always up and moving around.  The following portions of the chart were reviewed this encounter and updated as appropriate:       Review of Systems:  No other skin or systemic complaints except as noted in HPI or Assessment and Plan.  Objective  Well appearing patient in no apparent distress; mood and affect are within normal limits.  A focused examination was performed including buttocks. Relevant physical exam findings are noted in the Assessment and Plan.  left inferior  buttock Erythematous patch with central superficial dry 1.5 x 0.7 cm erosion, and superior small hyperpigmented crusted patch       Assessment & Plan  Rash and other nonspecific skin eruption left inferior  buttock  Chronic and persistent condition with duration or expected duration over one year. Condition is bothersome/symptomatic for patient. Currently flared.   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  Worsening on Protopic ointment.  No obvious signs of active infection.  Will start wound care.  Start mupirocin ointment to erosion followed by DuoDerm every 2-3 days.  mupirocin ointment (BACTROBAN) 2 % - left inferior  buttock Apply to affected area after cleansing followed by bandage every 2-3 days.  Control Gel Formula Dressing (DUODERM CGF DRESSING) MISC - left inferior  buttock Apply to affected area every 2-3 days   Return in about 2 weeks (around 06/18/2022) for  Rash.  Graciella Belton, RMA, am acting as scribe for Brendolyn Patty, MD .  Documentation: I have reviewed the above documentation for accuracy and completeness, and I agree with the above.  Brendolyn Patty MD

## 2022-06-04 NOTE — Patient Instructions (Addendum)
After cleansing affected area, apply mupirocin followed by DuoDerm. Change every 2-3 days.  Due to recent changes in healthcare laws, you may see results of your pathology and/or laboratory studies on MyChart before the doctors have had a chance to review them. We understand that in some cases there may be results that are confusing or concerning to you. Please understand that not all results are received at the same time and often the doctors may need to interpret multiple results in order to provide you with the best plan of care or course of treatment. Therefore, we ask that you please give [place] 2 business days to thoroughly review all your results before contacting the office for clarification. Should we see a critical lab result, you will be contacted sooner.   If You Need Anything After Your Visit  If you have any questions or concerns for your doctor, please call our main line at [phone] and press option 4 to reach your doctor's medical assistant. If no one answers, please leave a voicemail as directed and we will return your call as soon as possible. Messages left after 4 pm will be answered the following business day.   You may also send [place] a message via [place]. We typically respond to MyChart messages within 1-2 business days.  For prescription refills, please ask your pharmacy to contact our office. Our fax number is [phone].  If you have an urgent issue when the clinic is closed that cannot wait until the next business day, you can page your doctor at the number below.    Please note that while we do our best to be available for urgent issues outside of office hours, we are not available 24/7.   If you have an urgent issue and are unable to reach [place], you may choose to seek medical care at your doctor's office, retail clinic, urgent care center, or emergency room.  If you have a medical emergency, please immediately call 911 or go to the emergency department.  Pager  Numbers  - Dr. Nehemiah Massed: [phone]  - Dr. Laurence Ferrari: [phone]  - Dr. Nicole Kindred: [phone]  In the event of inclement weather, please call our main line at [phone] for an update on the status of any delays or closures.  Dermatology Medication Tips: Please keep the boxes that topical medications come in in order to help keep track of the instructions about where and how to use these. Pharmacies typically print the medication instructions only on the boxes and not directly on the medication tubes.   If your medication is too expensive, please contact our office at [phone] option 4 or send [place] a message through [place].   We are unable to tell what your co-pay for medications will be in advance as this is different depending on your insurance coverage. However, we may be able to find a substitute medication at lower cost or fill out paperwork to get insurance to cover a needed medication.   If a prior authorization is required to get your medication covered by your insurance company, please allow [place] 1-2 business days to complete this process.  Drug prices often vary depending on where the prescription is filled and some pharmacies may offer cheaper prices.  The website www.goodrx.com contains coupons for medications through different pharmacies. The prices here do not account for what the cost may be with help from insurance (it may be cheaper with your insurance), but the website can give you the price if you did not use  any insurance.  - You can print the associated coupon and take it with your prescription to the pharmacy.  - You may also stop by our office during regular business hours and pick up a GoodRx coupon card.  - If you need your prescription sent electronically to a different pharmacy, notify our office through [facility] or by phone at [phone] option 4.     Si Usted Necesita Algo Despus de Su Visita  Tambin puede enviarnos un mensaje a travs de  Pharmacist, community. Por lo general respondemos a los mensajes de MyChart en el transcurso de 1 a 2 das hbiles.  Para renovar recetas, por favor pida a su farmacia que se ponga en contacto con nuestra oficina. Harland Dingwall de fax es [place] [phone].  Si tiene un asunto urgente cuando la clnica est cerrada y que no puede esperar hasta el siguiente da hbil, puede llamar/localizar a su doctor(a) al nmero que aparece a continuacin.   Por favor, tenga en cuenta que aunque hacemos todo lo posible para estar disponibles para asuntos urgentes fuera del horario de [place], no estamos disponibles las 24 horas del da, los 7 das de la [place].   Si tiene un problema urgente y no puede comunicarse con nosotros, puede optar por buscar atencin mdica  en el consultorio de su doctor(a), en una clnica privada, en un centro de atencin urgente o en una sala de emergencias.  Si tiene Engineering geologist, por favor llame inmediatamente al 911 o vaya a la sala de emergencias.  Nmeros de bper  - Dr. Nehemiah Massed: [phone]  - Dra. Moye: [phone]  - Dra. Nicole Kindred: [phone]  En caso de inclemencias del [place], por favor llame a Johnsie Kindred principal al [phone] para una actualizacin sobre el [place] de cualquier retraso o cierre.  Consejos para la medicacin en dermatologa: Por favor, guarde las cajas en las que vienen los medicamentos de uso tpico para ayudarle a seguir las instrucciones sobre dnde y cmo usarlos. Las farmacias generalmente imprimen las instrucciones del medicamento slo en las cajas y no directamente en los tubos del [place].   Si su medicamento es muy caro, por favor, pngase en contacto con Zigmund Daniel llamando al [phone] y presione la opcin 4 o envenos un mensaje a travs de Pharmacist, community.   No podemos decirle cul ser su copago por los medicamentos por adelantado ya que esto es diferente dependiendo de la cobertura de su seguro. Sin embargo, es posible que  podamos encontrar un medicamento sustituto a Electrical engineer un formulario para que el seguro cubra el medicamento que se considera necesario.   Si se requiere una autorizacin previa para que su compaa de seguros [place] su medicamento, por favor permtanos de 1 a 2 das hbiles para completar este proceso.  Los precios de los medicamentos varan con frecuencia dependiendo del Environmental consultant de dnde se surte la receta y alguna farmacias pueden ofrecer precios ms baratos.  El sitio web www.goodrx.com tiene cupones para medicamentos de Airline pilot. Los precios aqu no tienen en cuenta lo que podra costar con la ayuda del seguro (puede ser ms barato con su seguro), pero el sitio web puede darle el precio si no utiliz Research scientist (physical sciences).  - Puede imprimir el cupn correspondiente y llevarlo con su receta a la farmacia.  - Tambin puede pasar por nuestra oficina durante el horario de atencin regular y Charity fundraiser una tarjeta de cupones de GoodRx.  - Si necesita que su receta se enve electrnicamente a  una farmacia diferente, informe a nuestra oficina a travs de MyChart de [place] o por telfono llamando al [phone] y presione la opcin 4.

## 2022-06-10 ENCOUNTER — Other Ambulatory Visit: Payer: Self-pay | Admitting: Family Medicine

## 2022-06-17 ENCOUNTER — Ambulatory Visit: Payer: Medicare HMO | Admitting: Dermatology

## 2022-06-17 VITALS — BP 151/81

## 2022-06-17 DIAGNOSIS — B351 Tinea unguium: Secondary | ICD-10-CM

## 2022-06-17 DIAGNOSIS — Z5181 Encounter for therapeutic drug level monitoring: Secondary | ICD-10-CM | POA: Diagnosis not present

## 2022-06-17 DIAGNOSIS — R21 Rash and other nonspecific skin eruption: Secondary | ICD-10-CM

## 2022-06-17 NOTE — Patient Instructions (Addendum)
Terbinafine Counseling  Terbinafine is an anti-fungal medicine that can be applied to the skin (over the counter) or taken by mouth (prescription) to treat fungal infections. The pill version is often used to treat fungal infections of the nails or scalp. While most people do not have any side effects from taking terbinafine pills, some possible side effects of the medicine can include taste changes, headache, loss of smell, vision changes, nausea, vomiting, or diarrhea.   Rare side effects can include irritation of the liver, allergic reaction, or decrease in blood counts (which may show up as not feeling well or developing an infection). If you are concerned about any of these side effects, please stop the medicine and call your doctor, or in the case of an emergency such as feeling very unwell, seek immediate medical care.    Due to recent changes in healthcare laws, you may see results of your pathology and/or laboratory studies on MyChart before the doctors have had a chance to review them. We understand that in some cases there may be results that are confusing or concerning to you. Please understand that not all results are received at the same time and often the doctors may need to interpret multiple results in order to provide you with the best plan of care or course of treatment. Therefore, we ask that you please give [place] 2 business days to thoroughly review all your results before contacting the office for clarification. Should we see a critical lab result, you will be contacted sooner.   If You Need Anything After Your Visit  If you have any questions or concerns for your doctor, please call our main line at [phone] and press option 4 to reach your doctor's medical assistant. If no one answers, please leave a voicemail as directed and we will return your call as soon as possible. Messages left after 4 pm will be answered the following business day.   You may also send [place] a message via  [place]. We typically respond to MyChart messages within 1-2 business days.  For prescription refills, please ask your pharmacy to contact our office. Our fax number is [phone].  If you have an urgent issue when the clinic is closed that cannot wait until the next business day, you can page your doctor at the number below.    Please note that while we do our best to be available for urgent issues outside of office hours, we are not available 24/7.   If you have an urgent issue and are unable to reach [place], you may choose to seek medical care at your doctor's office, retail clinic, urgent care center, or emergency room.  If you have a medical emergency, please immediately call 911 or go to the emergency department.  Pager Numbers  - Dr. Nehemiah Massed: [phone]  - Dr. Laurence Ferrari: [phone]  - Dr. Nicole Kindred: [phone]  In the event of inclement weather, please call our main line at [phone] for an update on the status of any delays or closures.  Dermatology Medication Tips: Please keep the boxes that topical medications come in in order to help keep track of the instructions about where and how to use these. Pharmacies typically print the medication instructions only on the boxes and not directly on the medication tubes.   If your medication is too expensive, please contact our office at [phone] option 4 or send [place] a message through [place].   We are unable to tell what your co-pay for medications will be  in advance as this is different depending on your insurance coverage. However, we may be able to find a substitute medication at lower cost or fill out paperwork to get insurance to cover a needed medication.   If a prior authorization is required to get your medication covered by your insurance company, please allow [place] 1-2 business days to complete this process.  Drug prices often vary depending on where the prescription is filled and some pharmacies may offer cheaper  prices.  The website www.goodrx.com contains coupons for medications through different pharmacies. The prices here do not account for what the cost may be with help from insurance (it may be cheaper with your insurance), but the website can give you the price if you did not use any insurance.  - You can print the associated coupon and take it with your prescription to the pharmacy.  - You may also stop by our office during regular business hours and pick up a GoodRx coupon card.  - If you need your prescription sent electronically to a different pharmacy, notify our office through [facility] or by phone at [phone] option 4.     Si Usted Necesita Algo Despus de Su Visita  Tambin puede enviarnos un mensaje a travs de Pharmacist, community. Por lo general respondemos a los mensajes de MyChart en el transcurso de 1 a 2 das hbiles.  Para renovar recetas, por favor pida a su farmacia que se ponga en contacto con nuestra oficina. Harland Dingwall de fax es [place] [phone].  Si tiene un asunto urgente cuando la clnica est cerrada y que no puede esperar hasta el siguiente da hbil, puede llamar/localizar a su doctor(a) al nmero que aparece a continuacin.   Por favor, tenga en cuenta que aunque hacemos todo lo posible para estar disponibles para asuntos urgentes fuera del horario de [place], no estamos disponibles las 24 horas del da, los 7 das de la [place].   Si tiene un problema urgente y no puede comunicarse con nosotros, puede optar por buscar atencin mdica  en el consultorio de su doctor(a), en una clnica privada, en un centro de atencin urgente o en una sala de emergencias.  Si tiene Engineering geologist, por favor llame inmediatamente al 911 o vaya a la sala de emergencias.  Nmeros de bper  - Dr. Nehemiah Massed: [phone]  - Dra. Moye: [phone]  - Dra. Nicole Kindred: [phone]  En caso de inclemencias del [place], por favor llame a Johnsie Kindred principal al [phone]  para una actualizacin sobre el [place] de cualquier retraso o cierre.  Consejos para la medicacin en dermatologa: Por favor, guarde las cajas en las que vienen los medicamentos de uso tpico para ayudarle a seguir las instrucciones sobre dnde y cmo usarlos. Las farmacias generalmente imprimen las instrucciones del medicamento slo en las cajas y no directamente en los tubos del [place].   Si su medicamento es muy caro, por favor, pngase en contacto con Zigmund Daniel llamando al [phone] y presione la opcin 4 o envenos un mensaje a travs de Pharmacist, community.   No podemos decirle cul ser su copago por los medicamentos por adelantado ya que esto es diferente dependiendo de la cobertura de su seguro. Sin embargo, es posible que podamos encontrar un medicamento sustituto a Electrical engineer un formulario para que el seguro cubra el medicamento que se considera necesario.   Si se requiere una autorizacin previa para que su compaa de seguros [place] su medicamento, por favor permtanos de 1 a  2 das hbiles para completar este proceso.  Los precios de los medicamentos varan con frecuencia dependiendo del Environmental consultant de dnde se surte la receta y alguna farmacias pueden ofrecer precios ms baratos.  El sitio web www.goodrx.com tiene cupones para medicamentos de Airline pilot. Los precios aqu no tienen en cuenta lo que podra costar con la ayuda del seguro (puede ser ms barato con su seguro), pero el sitio web puede darle el precio si no utiliz Research scientist (physical sciences).  - Puede imprimir el cupn correspondiente y llevarlo con su receta a la farmacia.  - Tambin puede pasar por nuestra oficina durante el horario de atencin regular y Charity fundraiser una tarjeta de cupones de GoodRx.  - Si necesita que su receta se enve electrnicamente a una farmacia diferente, informe a nuestra oficina a travs de MyChart de [place] o por telfono llamando al [phone] y presione la opcin 4.

## 2022-06-17 NOTE — Progress Notes (Signed)
   Follow-Up Visit   Subjective  Karl Myers is a 77 y.o. male who presents for the following: Follow-up (2 week f/u erosion of the left inferior buttock, improving with mupirocin ointment and DuoDerm patches. ). No pain or itch, not oozing.   The following portions of the chart were reviewed this encounter and updated as appropriate:       Review of Systems:  No other skin or systemic complaints except as noted in HPI or Assessment and Plan.  Objective  Well appearing patient in no apparent distress; mood and affect are within normal limits.  A focused examination was performed including buttocks/thighs. Relevant physical exam findings are noted in the Assessment and Plan.  left inferior buttock Light pink patch, no ulceration or erosion or scale  toenails, feet See previous exam    Assessment & Plan  Rash and other nonspecific skin eruption left inferior buttock  Superficial erosion vs pressure ulceration, much improved. Healed.  Continue DuoDerm patch to area x 1 more week.  If recurs, restart mupirocin ointment and DuoDerm.  Related Medications mupirocin ointment (BACTROBAN) 2 % Apply to affected area after cleansing followed by bandage every 2-3 days.  Control Gel Formula Dressing (DUODERM CGF DRESSING) MISC Apply to affected area every 2-3 days  Tinea unguium toenails, feet  With Tinea Pedis  Continue terbinafine '250MG'$  1 po QD with food as directed.   Patient has upcoming appt with PCP with labs. Pending labs, will send in terbinafine 2Rf.  Terbinafine Counseling  Terbinafine is an anti-fungal medicine that can be applied to the skin (over the counter) or taken by mouth (prescription) to treat fungal infections. The pill version is often used to treat fungal infections of the nails or scalp. While most people do not have any side effects from taking terbinafine pills, some possible side effects of the medicine can include taste changes, headache, loss of  smell, vision changes, nausea, vomiting, or diarrhea.   Rare side effects can include irritation of the liver, allergic reaction, or decrease in blood counts (which may show up as not feeling well or developing an infection). If you are concerned about any of these side effects, please stop the medicine and call your doctor, or in the case of an emergency such as feeling very unwell, seek immediate medical care.     Related Medications terbinafine (LAMISIL) 250 MG tablet Take 1 tablet (250 mg total) by mouth daily.   Return for TBSE with Dr Chauncey Cruel 10/2022, Hx melanoma.  Documentation: I have reviewed the above documentation for accuracy and completeness, and I agree with the above.  Brendolyn Patty MD

## 2022-07-01 DIAGNOSIS — R051 Acute cough: Secondary | ICD-10-CM | POA: Diagnosis not present

## 2022-07-01 DIAGNOSIS — R059 Cough, unspecified: Secondary | ICD-10-CM | POA: Diagnosis not present

## 2022-07-01 DIAGNOSIS — J441 Chronic obstructive pulmonary disease with (acute) exacerbation: Secondary | ICD-10-CM | POA: Diagnosis not present

## 2022-07-01 DIAGNOSIS — Z03818 Encounter for observation for suspected exposure to other biological agents ruled out: Secondary | ICD-10-CM | POA: Diagnosis not present

## 2022-07-20 ENCOUNTER — Other Ambulatory Visit: Payer: Self-pay | Admitting: Family Medicine

## 2022-07-20 DIAGNOSIS — G479 Sleep disorder, unspecified: Secondary | ICD-10-CM

## 2022-07-20 NOTE — Telephone Encounter (Signed)
Last refill: 12/18/2021  LOV: 02/03/22 NOV: 08/10/22

## 2022-07-20 NOTE — Telephone Encounter (Signed)
This has not been filled in 8 months. Please check with the patient to see why he is now requesting a refill. Has he been taking it the past 8 months? Or not needed it? If he has not been taking it please find out why he needs it now. Thanks.

## 2022-07-21 NOTE — Telephone Encounter (Signed)
Noted. Sent to pharmacy.

## 2022-07-27 ENCOUNTER — Ambulatory Visit
Admission: RE | Admit: 2022-07-27 | Discharge: 2022-07-27 | Disposition: A | Discharge status: Home / Self Care | Payer: Medicare HMO | Source: Ambulatory Visit | Attending: Family Medicine | Admitting: Family Medicine

## 2022-07-27 ENCOUNTER — Encounter: Payer: Self-pay | Admitting: Family Medicine

## 2022-07-27 ENCOUNTER — Ambulatory Visit (INDEPENDENT_AMBULATORY_CARE_PROVIDER_SITE_OTHER): Payer: Medicare HMO | Admitting: Family Medicine

## 2022-07-27 VITALS — BP 134/82 | HR 67 | Temp 97.9°F | Ht 68.0 in | Wt 159.4 lb

## 2022-07-27 DIAGNOSIS — R519 Headache, unspecified: Secondary | ICD-10-CM | POA: Diagnosis not present

## 2022-07-27 DIAGNOSIS — I10 Essential (primary) hypertension: Secondary | ICD-10-CM

## 2022-07-27 MED ORDER — LISINOPRIL 10 MG PO TABS: 10.0000 mg | ORAL_TABLET | Freq: Every day | ORAL | 3 refills | Status: DC

## 2022-07-27 NOTE — Assessment & Plan Note (Signed)
Patient with sudden onset worst headache of life yesterday.  Certainly this could be associated with his blood pressure though given his description this is concerning for subarachnoid hemorrhage.  Location of headache could also indicate temporal arteritis.  Will get a CT of his head today and check an ESR to evaluate for those underlying causes.  If those things are negative we will see how his blood pressure treatment affects his headache.

## 2022-07-27 NOTE — Progress Notes (Signed)
Tommi Rumps, MD Phone: [phone]  Karl Myers is a 77 y.o. male who presents today for same day visit.   Hypertension: Patient is on lisinopril 5 mg daily and atenolol 25 mg daily.  He notes over the last 3 days his blood pressure has been up between 150-171/75-83.  He notes yesterday he woke up with the worst headache he is ever had over his left eye.  He notes no chest pain, shortness of breath, edema, vision changes, numbness, or focal weakness.  He notes generalized weakness that is chronic and ongoing.  He does note the headache he had yesterday was the worst he is ever had.  He does not typically have headaches.  He does note the headache is not there at this time.  Social History   Tobacco Use  Smoking Status Every Day   Packs/day: 0.25   Years: 65.00   Total pack years: 16.25   Types: Cigars, Cigarettes  Smokeless Tobacco Never  Tobacco Comments   0.5PPD 12/03/2021    Current Outpatient Medications on File Prior to Visit  Medication Sig Dispense Refill   ALPRAZolam (XANAX) 0.5 MG tablet Take 1 tablet (0.5 mg total) by mouth at bedtime as needed for anxiety. 30 tablet 0   atenolol (TENORMIN) 25 MG tablet TAKE ONE TABLET BY MOUTH AT BEDTIME 90 tablet 3   atorvastatin (LIPITOR) 80 MG tablet TAKE 1 TABLET BY MOUTH DAILY. 90 tablet 1   Control Gel Formula Dressing (DUODERM CGF DRESSING) MISC Apply to affected area every 2-3 days 5 each 0   cyanocobalamin (VITAMIN B12) 1000 MCG tablet Take 1 tablet (1,000 mcg total) by mouth daily. 90 tablet 1   ELIQUIS 5 MG TABS tablet TAKE ONE TABLET BY MOUTH TWICE DAILY (Patient taking differently: Take 5 mg by mouth daily.) 90 tablet 3   fluorouracil (EFUDEX) 5 % cream Apply topically 2 (two) times daily. Starting on May 01, 2022 apply bid to forehead and temples for 7 days 30 g 2   Fluticasone-Umeclidin-Vilant (TRELEGY ELLIPTA) 100-62.5-25 MCG/ACT AEPB Inhale 1 puff into the lungs daily. 28 each 11   gabapentin (NEURONTIN) 100  MG capsule TAKE 1 CAPSULE BY MOUTH 3 TIMES DAILY 270 capsule 1   mupirocin ointment (BACTROBAN) 2 % Apply to affected area after cleansing followed by bandage every 2-3 days. 22 g 0   nitroGLYCERIN (NITROSTAT) 0.4 MG SL tablet Place 1 tablet (0.4 mg total) under the tongue every 5 (five) minutes as needed for chest pain. For up to 3 doses per episode. 50 tablet 0   predniSONE (DELTASONE) 20 MG tablet Take 20 mg by mouth 2 (two) times daily.     promethazine-dextromethorphan (PROMETHAZINE-DM) 6.25-15 MG/5ML syrup Take by mouth.     Spacer/Aero-Holding Chambers (AEROCHAMBER PLUS) inhaler Use with inhaler 1 each 2   tacrolimus (PROTOPIC) 0.1 % ointment Apply topically 2 (two) times daily. Bid to aa buttocks until resolved 60 g 1   terbinafine (LAMISIL) 250 MG tablet Take 1 tablet (250 mg total) by mouth daily. 30 tablet 0   traZODone (DESYREL) 150 MG tablet TAKE ONE TABLET AT BEDTIME 90 tablet 1   No current facility-administered medications on file prior to visit.     ROS see history of present illness  Objective  Physical Exam Vitals:   07/27/22 1103 07/27/22 1113  BP: (!) 142/86 134/82  Pulse: 67   Temp: 97.9 F (36.6 C)   SpO2: 99%     BP Readings from Last 3 Encounters:  07/27/22 134/82  06/17/22 (!) 151/81  05/07/22 (!) 156/89   Wt Readings from Last 3 Encounters:  07/27/22 159 lb 6.4 oz (72.3 kg)  05/07/22 163 lb 9.6 oz (74.2 kg)  03/26/22 165 lb 1.6 oz (74.9 kg)    Physical Exam Constitutional:      General: He is not in acute distress.    Appearance: He is not diaphoretic.  HENT:     Head:     Comments: No tenderness over either temple or forehead area Cardiovascular:     Rate and Rhythm: Normal rate and regular rhythm.     Heart sounds: Normal heart sounds.  Pulmonary:     Effort: Pulmonary effort is normal.     Breath sounds: Normal breath sounds.  Skin:    General: Skin is warm and dry.  Neurological:     Mental Status: He is alert.     Comments: CN  3-12 intact, 5/5 strength in bilateral biceps, triceps, grip, quads, hamstrings, plantar and dorsiflexion, sensation to light touch intact in bilateral UE and LE, normal gait      Assessment/Plan: Please see individual problem list.  Primary hypertension Assessment & Plan: Chronic issue.  Elevated recently.  We will increase lisinopril to 10 mg daily and continue atenolol 25 mg daily.  He will continue to monitor his blood pressure.  He will monitor for lightheadedness with the dose increase.  Will check labs in 7 to 10 days.  Orders: -     Basic metabolic panel; Future -     Lisinopril; Take 1 tablet (10 mg total) by mouth at bedtime.  Dispense: 90 tablet; Refill: 3  Acute nonintractable headache, unspecified headache type Assessment & Plan: Patient with sudden onset worst headache of life yesterday.  Certainly this could be associated with his blood pressure though given his description this is concerning for subarachnoid hemorrhage.  Location of headache could also indicate temporal arteritis.  Will get a CT of his head today and check an ESR to evaluate for those underlying causes.  If those things are negative we will see how his blood pressure treatment affects his headache.  Orders: -     CT HEAD WO CONTRAST (5MM); Future -     Sedimentation rate     Return in about 10 days (around 08/06/2022) for Labs, 1 month BP follow-up with PCP.   Tommi Rumps, MD [place]

## 2022-07-27 NOTE — Patient Instructions (Signed)
Nice to see you. Will try to get a CT scan of your head today.  We are also checking lab work today. We are going to increase your lisinopril to 10 mg daily.  If you start to get lightheaded with this increase please let us know.  We will recheck labs in 10 days and have you follow-up with me in 1 month.

## 2022-07-27 NOTE — Assessment & Plan Note (Signed)
Chronic issue.  Elevated recently.  We will increase lisinopril to 10 mg daily and continue atenolol 25 mg daily.  He will continue to monitor his blood pressure.  He will monitor for lightheadedness with the dose increase.  Will check labs in 7 to 10 days.

## 2022-08-06 ENCOUNTER — Other Ambulatory Visit (INDEPENDENT_AMBULATORY_CARE_PROVIDER_SITE_OTHER): Payer: Medicare HMO

## 2022-08-06 ENCOUNTER — Other Ambulatory Visit: Payer: Medicare HMO

## 2022-08-06 DIAGNOSIS — R519 Headache, unspecified: Secondary | ICD-10-CM

## 2022-08-06 DIAGNOSIS — I10 Essential (primary) hypertension: Secondary | ICD-10-CM

## 2022-08-06 LAB — BASIC METABOLIC PANEL
BUN: 15 mg/dL (ref 6–23)
CO2: 30 mEq/L (ref 19–32)
Calcium: 9.5 mg/dL (ref 8.4–10.5)
Chloride: 102 mEq/L (ref 96–112)
Creatinine, Ser: 0.99 mg/dL (ref 0.40–1.50)
GFR: 73.78 mL/min (ref >60.00)
Glucose, Bld: 107 mg/dL — ABNORMAL HIGH (ref 70–99)
Potassium: 4.4 mEq/L (ref 3.5–5.1)
Sodium: 139 mEq/L (ref 135–145)

## 2022-08-06 LAB — SEDIMENTATION RATE: Sed Rate: 12 mm/hr (ref 0–20)

## 2022-08-06 NOTE — Addendum Note (Signed)
Addended by: Leeanne Rio on: 08/06/2022 08:50 AM   Modules accepted: Orders

## 2022-08-10 ENCOUNTER — Ambulatory Visit: Payer: PPO | Admitting: Family Medicine

## 2022-08-11 ENCOUNTER — Telehealth: Payer: Self-pay

## 2022-08-11 NOTE — Telephone Encounter (Signed)
  Lm for pt to cb re ::   Caryl Bis Angela Adam, MD  [facility] Please let the patient know that his sedimentation rate was acceptable.  This does not indicate an inflammatory cause for his headache.  Is he still having headaches?  His glucose was a little elevated.  This could have occurred if he was not fasting for this test.  His other lab work is acceptable.

## 2022-08-11 NOTE — Telephone Encounter (Signed)
Pt called and I read the message to him and pt stated he was unaware that he had to fast for the lab work and pt is still having headaches occasionally

## 2022-08-11 NOTE — Telephone Encounter (Signed)
-----   Message from Leone Haven, MD sent at 08/10/2022  8:43 AM EST ----- Please let the patient know that his sedimentation rate was acceptable.  This does not indicate an inflammatory cause for his headache.  Is he still having headaches?  His glucose was a little elevated.  This could have occurred if he was not fasting for this test.  His other lab work is acceptable.

## 2022-08-12 NOTE — Telephone Encounter (Signed)
Patient was called and scheduled to see the provider about the headaches.  Duanne Duchesne,cma

## 2022-08-12 NOTE — Telephone Encounter (Signed)
Noted.  If his headaches are not terribly frequent we could continue to monitor.  If they are still frequent we could consider specific treatment for the headaches though I would like to have him come back to the office to discuss the treatment options in person.

## 2022-08-17 ENCOUNTER — Ambulatory Visit (INDEPENDENT_AMBULATORY_CARE_PROVIDER_SITE_OTHER): Payer: Medicare HMO | Admitting: Family Medicine

## 2022-08-17 ENCOUNTER — Encounter: Payer: Self-pay | Admitting: Family Medicine

## 2022-08-17 VITALS — BP 130/86 | HR 70 | Temp 97.7°F | Ht 68.0 in | Wt 161.6 lb

## 2022-08-17 DIAGNOSIS — R519 Headache, unspecified: Secondary | ICD-10-CM

## 2022-08-17 DIAGNOSIS — I10 Essential (primary) hypertension: Secondary | ICD-10-CM | POA: Diagnosis not present

## 2022-08-17 MED ORDER — VALSARTAN 160 MG PO TABS: 160.0000 mg | ORAL_TABLET | Freq: Every day | ORAL | 3 refills | Status: DC

## 2022-08-17 NOTE — Assessment & Plan Note (Signed)
Continues to have issues with headaches.  Discussed this could be from his blood pressure or could be a tension headache issue.  Prior CT was reassuring.  ESR is in the normal range.  Will see if his headache responds to improve blood pressure control and if it does not we could consider nortriptyline.

## 2022-08-17 NOTE — Progress Notes (Signed)
Tommi Rumps, MD Phone: [phone]  Karl Myers is a 77 y.o. male who presents today for follow-up.  Hypertension: Patient notes blood pressures are typically 133-166/69-94.  He is on atenolol and lisinopril.  No chest pain or shortness of breath.  He noted some left thigh swelling yesterday but that has resolved.  No lower extremity swelling otherwise.  Headaches: He continues to have issues with headaches over his right eye.  He notes no numbness.  No focal weakness.  No vision changes.  Prior CT head unremarkable for cause.  ESR was in the normal range.  Social History   Tobacco Use  Smoking Status Every Day   Packs/day: 0.25   Years: 65.00   Total pack years: 16.25   Types: Cigars, Cigarettes  Smokeless Tobacco Never  Tobacco Comments   0.5PPD 12/03/2021    Current Outpatient Medications on File Prior to Visit  Medication Sig Dispense Refill   ALPRAZolam (XANAX) 0.5 MG tablet Take 1 tablet (0.5 mg total) by mouth at bedtime as needed for anxiety. 30 tablet 0   atenolol (TENORMIN) 25 MG tablet TAKE ONE TABLET BY MOUTH AT BEDTIME 90 tablet 3   atorvastatin (LIPITOR) 80 MG tablet TAKE 1 TABLET BY MOUTH DAILY. 90 tablet 1   cyanocobalamin (VITAMIN B12) 1000 MCG tablet Take 1 tablet (1,000 mcg total) by mouth daily. 90 tablet 1   ELIQUIS 5 MG TABS tablet TAKE ONE TABLET BY MOUTH TWICE DAILY (Patient taking differently: Take 5 mg by mouth daily.) 90 tablet 3   fluorouracil (EFUDEX) 5 % cream Apply topically 2 (two) times daily. Starting on May 01, 2022 apply bid to forehead and temples for 7 days 30 g 2   Fluticasone-Umeclidin-Vilant (TRELEGY ELLIPTA) 100-62.5-25 MCG/ACT AEPB Inhale 1 puff into the lungs daily. 28 each 11   gabapentin (NEURONTIN) 100 MG capsule TAKE 1 CAPSULE BY MOUTH 3 TIMES DAILY 270 capsule 1   mupirocin ointment (BACTROBAN) 2 % Apply to affected area after cleansing followed by bandage every 2-3 days. 22 g 0   nitroGLYCERIN (NITROSTAT) 0.4 MG SL  tablet Place 1 tablet (0.4 mg total) under the tongue every 5 (five) minutes as needed for chest pain. For up to 3 doses per episode. 50 tablet 0   Spacer/Aero-Holding Chambers (AEROCHAMBER PLUS) inhaler Use with inhaler 1 each 2   tacrolimus (PROTOPIC) 0.1 % ointment Apply topically 2 (two) times daily. Bid to aa buttocks until resolved 60 g 1   terbinafine (LAMISIL) 250 MG tablet Take 1 tablet (250 mg total) by mouth daily. 30 tablet 0   traZODone (DESYREL) 150 MG tablet TAKE ONE TABLET AT BEDTIME 90 tablet 1   No current facility-administered medications on file prior to visit.     ROS see history of present illness  Objective  Physical Exam Vitals:   08/17/22 1447  BP: 130/86  Pulse: 70  Temp: 97.7 F (36.5 C)  SpO2: 95%    BP Readings from Last 3 Encounters:  08/17/22 130/86  07/27/22 134/82  06/17/22 (!) 151/81   Wt Readings from Last 3 Encounters:  08/17/22 161 lb 9.6 oz (73.3 kg)  07/27/22 159 lb 6.4 oz (72.3 kg)  05/07/22 163 lb 9.6 oz (74.2 kg)    Physical Exam Constitutional:      General: He is not in acute distress.    Appearance: He is not diaphoretic.  Cardiovascular:     Rate and Rhythm: Normal rate and regular rhythm.     Heart sounds: Normal  heart sounds.  Pulmonary:     Effort: Pulmonary effort is normal.     Breath sounds: Normal breath sounds.  Skin:    General: Skin is warm and dry.  Neurological:     Mental Status: He is alert.     Comments: CN 3-12 intact, 5/5 strength in bilateral biceps, triceps, grip, quads, hamstrings, plantar and dorsiflexion, sensation to light touch intact in bilateral UE and LE, normal gait      Assessment/Plan: Please see individual problem list.  Primary hypertension Assessment & Plan: Chronic issue.  Above goal.  I am going to switch his lisinopril over to valsartan 160 mg daily.  He will continue atenolol 25 mg daily.  He will return in 10 days for labs and he will follow-up with me in 1  month.  Orders: -     Valsartan; Take 1 tablet (160 mg total) by mouth daily.  Dispense: 90 tablet; Refill: 3 -     Basic metabolic panel; Future  Acute nonintractable headache, unspecified headache type Assessment & Plan: Continues to have issues with headaches.  Discussed this could be from his blood pressure or could be a tension headache issue.  Prior CT was reassuring.  ESR is in the normal range.  Will see if his headache responds to improve blood pressure control and if it does not we could consider nortriptyline.      Return in about 10 days (around 08/27/2022) for Labs, 1 month PCP for hypertension/headache.   Tommi Rumps, MD [place]

## 2022-08-17 NOTE — Assessment & Plan Note (Signed)
Chronic issue.  Above goal.  I am going to switch his lisinopril over to valsartan 160 mg daily.  He will continue atenolol 25 mg daily.  He will return in 10 days for labs and he will follow-up with me in 1 month.

## 2022-08-17 NOTE — Patient Instructions (Signed)
Nice to see you. I am going to change your lisinopril over to valsartan.  If you notice any side effects with this please let me know. Please continue to monitor your blood pressure.

## 2022-08-20 ENCOUNTER — Other Ambulatory Visit: Payer: Self-pay | Admitting: Family Medicine

## 2022-08-20 DIAGNOSIS — E785 Hyperlipidemia, unspecified: Secondary | ICD-10-CM

## 2022-08-27 ENCOUNTER — Other Ambulatory Visit: Payer: Self-pay | Admitting: Family Medicine

## 2022-08-27 DIAGNOSIS — G479 Sleep disorder, unspecified: Secondary | ICD-10-CM

## 2022-08-27 NOTE — Telephone Encounter (Signed)
Refilled: 07/21/2022 Last OV: 08/17/2022 Next OV: 09/11/2022

## 2022-09-01 ENCOUNTER — Other Ambulatory Visit (INDEPENDENT_AMBULATORY_CARE_PROVIDER_SITE_OTHER): Payer: Medicare HMO

## 2022-09-01 DIAGNOSIS — I10 Essential (primary) hypertension: Secondary | ICD-10-CM | POA: Diagnosis not present

## 2022-09-01 LAB — BASIC METABOLIC PANEL
BUN: 13 mg/dL (ref 6–23)
CO2: 33 mEq/L — ABNORMAL HIGH (ref 19–32)
Calcium: 9.1 mg/dL (ref 8.4–10.5)
Chloride: 102 mEq/L (ref 96–112)
Creatinine, Ser: 1.08 mg/dL (ref 0.40–1.50)
GFR: 66.43 mL/min (ref >60.00)
Glucose, Bld: 109 mg/dL — ABNORMAL HIGH (ref 70–99)
Potassium: 4.5 mEq/L (ref 3.5–5.1)
Sodium: 140 mEq/L (ref 135–145)

## 2022-09-08 ENCOUNTER — Other Ambulatory Visit: Payer: Medicare HMO

## 2022-09-11 ENCOUNTER — Encounter: Payer: Self-pay | Admitting: Family Medicine

## 2022-09-11 ENCOUNTER — Ambulatory Visit (INDEPENDENT_AMBULATORY_CARE_PROVIDER_SITE_OTHER): Payer: Medicare HMO | Admitting: Family Medicine

## 2022-09-11 VITALS — BP 128/78 | HR 59 | Temp 97.8°F | Ht 68.0 in | Wt 160.0 lb

## 2022-09-11 DIAGNOSIS — I2510 Atherosclerotic heart disease of native coronary artery without angina pectoris: Secondary | ICD-10-CM | POA: Diagnosis not present

## 2022-09-11 DIAGNOSIS — I480 Paroxysmal atrial fibrillation: Secondary | ICD-10-CM | POA: Diagnosis not present

## 2022-09-11 DIAGNOSIS — J449 Chronic obstructive pulmonary disease, unspecified: Secondary | ICD-10-CM | POA: Diagnosis not present

## 2022-09-11 DIAGNOSIS — I10 Essential (primary) hypertension: Secondary | ICD-10-CM

## 2022-09-11 DIAGNOSIS — Z23 Encounter for immunization: Secondary | ICD-10-CM

## 2022-09-11 DIAGNOSIS — Z720 Tobacco use: Secondary | ICD-10-CM

## 2022-09-11 DIAGNOSIS — G479 Sleep disorder, unspecified: Secondary | ICD-10-CM

## 2022-09-11 DIAGNOSIS — R7303 Prediabetes: Secondary | ICD-10-CM | POA: Diagnosis not present

## 2022-09-11 DIAGNOSIS — E782 Mixed hyperlipidemia: Secondary | ICD-10-CM

## 2022-09-11 DIAGNOSIS — R718 Other abnormality of red blood cells: Secondary | ICD-10-CM

## 2022-09-11 LAB — COMPREHENSIVE METABOLIC PANEL
ALT: 18 U/L (ref 0–53)
AST: 20 U/L (ref 0–37)
Albumin: 4.3 g/dL (ref 3.5–5.2)
Alkaline Phosphatase: 92 U/L (ref 39–117)
BUN: 13 mg/dL (ref 6–23)
CO2: 30 mEq/L (ref 19–32)
Calcium: 9 mg/dL (ref 8.4–10.5)
Chloride: 103 mEq/L (ref 96–112)
Creatinine, Ser: 1.07 mg/dL (ref 0.40–1.50)
GFR: 67.16 mL/min (ref >60.00)
Glucose, Bld: 104 mg/dL — ABNORMAL HIGH (ref 70–99)
Potassium: 4.4 mEq/L (ref 3.5–5.1)
Sodium: 140 mEq/L (ref 135–145)
Total Bilirubin: 0.6 mg/dL (ref 0.2–1.2)
Total Protein: 6.2 g/dL (ref 6.0–8.3)

## 2022-09-11 LAB — HEMOGLOBIN A1C: Hgb A1c MFr Bld: 6.2 % (ref 4.6–6.5)

## 2022-09-11 LAB — LIPID PANEL
Cholesterol: 131 mg/dL (ref 0–200)
HDL: 46.8 mg/dL (ref >39.00)
LDL Cholesterol: 65 mg/dL (ref 0–99)
NonHDL: 84.46
Total CHOL/HDL Ratio: 3
Triglycerides: 95 mg/dL (ref 0.0–149.0)
VLDL: 19 mg/dL (ref 0.0–40.0)

## 2022-09-11 MED ORDER — TRELEGY ELLIPTA 100-62.5-25 MCG/ACT IN AEPB
1.0000 | INHALATION_SPRAY | Freq: Every day | RESPIRATORY_TRACT | 11 refills | Status: DC
Start: 2022-09-11 — End: 2023-06-09

## 2022-09-11 MED ORDER — PROMETHAZINE-DM 6.25-15 MG/5ML PO SYRP: 5.0000 mL | ORAL_SOLUTION | Freq: Four times a day (QID) | ORAL | 0 refills | Status: DC | PRN

## 2022-09-11 NOTE — Patient Instructions (Signed)
Nice to see you. Please start using the Trelegy inhaler again.  If this is too expensive please let me know.

## 2022-09-11 NOTE — Assessment & Plan Note (Signed)
Chronic issue.  Suboptimally controlled.  Will restart Trelegy.  He will let me know if this is excessively expensive.  He will let me know in several weeks if it is not beneficial.  I will refill his promethazine-dextromethorphan for 1 refill while he starts the Trelegy.

## 2022-09-11 NOTE — Assessment & Plan Note (Signed)
Chronic issue.  Continue Eliquis 5 mg twice daily and atenolol 25 mg daily.

## 2022-09-11 NOTE — Assessment & Plan Note (Signed)
Chronic issue.  Adequately controlled at home.  Patient will continue atenolol 25 mg daily and valsartan 160 mg daily.

## 2022-09-11 NOTE — Assessment & Plan Note (Signed)
Chronic issue.  Continue risk factor management. 

## 2022-09-11 NOTE — Progress Notes (Signed)
Karl AlarEric Kaitlynne Wenz, MD Phone: [phone]  Karl MurdochCecil Larry Myers is a 77 y.o. male who presents today for f/u.  HYPERTENSION/AFIB Disease Monitoring Home BP Monitoring 120s/70s Chest pain- no    Dyspnea- no Medications Compliance-  taking atenolol, eliquis, valsartan.  Edema- no BMET    Component Value Date/Time   NA 140 09/01/2022 1044   NA 137 05/06/2014 0751   K 4.5 09/01/2022 1044   K 4.1 05/06/2014 0751   CL 102 09/01/2022 1044   CL 103 05/06/2014 0751   CO2 33 (H) 09/01/2022 1044   CO2 28 05/06/2014 0751   GLUCOSE 109 (H) 09/01/2022 1044   GLUCOSE 121 (H) 05/06/2014 0751   BUN 13 09/01/2022 1044   BUN 10 05/06/2014 0751   CREATININE 1.08 09/01/2022 1044   CREATININE 1.00 06/17/2018 1439   CALCIUM 9.1 09/01/2022 1044   CALCIUM 8.6 05/06/2014 0751   GFRNONAA >60 03/26/2022 1153   GFRNONAA >60 05/06/2014 0751   GFRAA >60 07/27/2019 0259   GFRAA >60 05/06/2014 0751   Insomnia/anxiety: Patient notes no anxiety.  He does sleep well.  He takes Xanax once nightly to help with his sleep.  He gets 7.5 hours of sleep nightly.  No depression.  No drowsiness the next day.  COPD: Patient is requesting a refill on promethazine-dextromethorphan for cough.  He notes he has a bad cough at times.  He notes he smokes about a half a pack a day.  He wheezes at times.  The cough is daily.  The cough is nonproductive.  No associated fevers.  He does not use any inhalers currently.  Social History   Tobacco Use  Smoking Status Every Day   Packs/day: 0.25   Years: 65.00   Additional pack years: 0.00   Total pack years: 16.25   Types: Cigars, Cigarettes  Smokeless Tobacco Never  Tobacco Comments   0.5PPD 12/03/2021    Current Outpatient Medications on File Prior to Visit  Medication Sig Dispense Refill   ALPRAZolam (XANAX) 0.5 MG tablet TAKE ONE TABLET BY MOUTH AT BEDTIME AS NEEDED FOR ANXIETY 30 tablet 0   atenolol (TENORMIN) 25 MG tablet TAKE ONE TABLET BY MOUTH AT BEDTIME 90 tablet 3    atorvastatin (LIPITOR) 80 MG tablet TAKE 1 TABLET BY MOUTH DAILY 90 tablet 1   cyanocobalamin (VITAMIN B12) 1000 MCG tablet Take 1 tablet (1,000 mcg total) by mouth daily. 90 tablet 1   ELIQUIS 5 MG TABS tablet TAKE ONE TABLET BY MOUTH TWICE DAILY (Patient taking differently: Take 5 mg by mouth daily.) 90 tablet 3   fluorouracil (EFUDEX) 5 % cream Apply topically 2 (two) times daily. Starting on May 01, 2022 apply bid to forehead and temples for 7 days 30 g 2   gabapentin (NEURONTIN) 100 MG capsule TAKE 1 CAPSULE BY MOUTH 3 TIMES DAILY 270 capsule 1   mupirocin ointment (BACTROBAN) 2 % Apply to affected area after cleansing followed by bandage every 2-3 days. 22 g 0   nitroGLYCERIN (NITROSTAT) 0.4 MG SL tablet Place 1 tablet (0.4 mg total) under the tongue every 5 (five) minutes as needed for chest pain. For up to 3 doses per episode. 50 tablet 0   Spacer/Aero-Holding Chambers (AEROCHAMBER PLUS) inhaler Use with inhaler 1 each 2   tacrolimus (PROTOPIC) 0.1 % ointment Apply topically 2 (two) times daily. Bid to aa buttocks until resolved 60 g 1   terbinafine (LAMISIL) 250 MG tablet Take 1 tablet (250 mg total) by mouth daily. 30 tablet 0  traZODone (DESYREL) 150 MG tablet TAKE ONE TABLET AT BEDTIME 90 tablet 1   valsartan (DIOVAN) 160 MG tablet Take 1 tablet (160 mg total) by mouth daily. 90 tablet 3   No current facility-administered medications on file prior to visit.     ROS see history of present illness  Objective  Physical Exam Vitals:   09/11/22 0832 09/11/22 0841  BP: 130/80 128/78  Pulse: (!) 59   Temp: 97.8 F (36.6 C)   SpO2: 93%     BP Readings from Last 3 Encounters:  09/11/22 128/78  08/17/22 130/86  07/27/22 134/82   Wt Readings from Last 3 Encounters:  09/11/22 160 lb (72.6 kg)  08/17/22 161 lb 9.6 oz (73.3 kg)  07/27/22 159 lb 6.4 oz (72.3 kg)    Physical Exam Constitutional:      General: He is not in acute distress.    Appearance: He is not  diaphoretic.  Cardiovascular:     Rate and Rhythm: Normal rate and regular rhythm.     Heart sounds: Normal heart sounds.  Pulmonary:     Effort: Pulmonary effort is normal.     Breath sounds: Normal breath sounds.  Skin:    General: Skin is warm and dry.  Neurological:     Mental Status: He is alert.      Assessment/Plan: Please see individual problem list.  Primary hypertension Assessment & Plan: Chronic issue.  Adequately controlled at home.  Patient will continue atenolol 25 mg daily and valsartan 160 mg daily.  Orders: -     Comprehensive metabolic panel -     Lipid panel  Chronic obstructive pulmonary disease, unspecified COPD type Assessment & Plan: Chronic issue.  Suboptimally controlled.  Will restart Trelegy.  He will let me know if this is excessively expensive.  He will let me know in several weeks if it is not beneficial.  I will refill his promethazine-dextromethorphan for 1 refill while he starts the Trelegy.  Orders: -     Promethazine-DM; Take 5 mLs by mouth 4 (four) times daily as needed for cough.  Dispense: 118 mL; Refill: 0 -     Trelegy Ellipta; Inhale 1 puff into the lungs daily.  Dispense: 28 each; Refill: 11  Mixed hyperlipidemia Assessment & Plan: Chronic issue.  Continue Lipitor 80 mg daily.  Check lipid panel.  Orders: -     Comprehensive metabolic panel -     Lipid panel  Prediabetes Assessment & Plan: Check A1c.  Orders: -     Hemoglobin A1c  Coronary artery disease involving native coronary artery of native heart without angina pectoris Assessment & Plan: Chronic issue.  Continue risk factor management.   Paroxysmal atrial fibrillation Assessment & Plan: Chronic issue.  Continue Eliquis 5 mg twice daily and atenolol 25 mg daily.   Sleeping difficulty Assessment & Plan: Chronic issue.  Adequately controlled.  Patient will continue 0.5 mg of Xanax nightly as needed.   Tobacco abuse Assessment & Plan: I encouraged smoking  cessation.  Patient is not ready to quit.  He will continue lung cancer screening as long as his insurance will pay for this.   Encounter for administration of vaccine -     Pneumococcal conjugate vaccine 20-valent     Health Maintenance: prevnar20 given today.   Return in about 6 months (around 03/13/2023).   Karl Alar, MD [facility]

## 2022-09-11 NOTE — Assessment & Plan Note (Signed)
Check A1c. 

## 2022-09-11 NOTE — Assessment & Plan Note (Signed)
Chronic issue.  Adequately controlled.  Patient will continue 0.5 mg of Xanax nightly as needed.

## 2022-09-11 NOTE — Assessment & Plan Note (Signed)
I encouraged smoking cessation.  Patient is not ready to quit.  He will continue lung cancer screening as long as his insurance will pay for this.

## 2022-09-11 NOTE — Assessment & Plan Note (Signed)
Chronic issue.  Continue Lipitor 80 mg daily.  Check lipid panel.

## 2022-09-17 ENCOUNTER — Telehealth: Payer: Self-pay | Admitting: Cardiovascular Disease

## 2022-09-17 NOTE — Telephone Encounter (Signed)
Pt c/o BP issue: STAT if pt c/o blurred vision, one-sided weakness or slurred speech  1. What are your last 5 BP readings? 167/85  2. Are you having any other symptoms (ex. Dizziness, headache, blurred vision, passed out)? Headache, anxious  3. What is your BP issue?

## 2022-09-17 NOTE — Telephone Encounter (Addendum)
Reviewed the patient's chart- his last office visit with cardiology was 03/24/21.   He has been following up with his PCP- Dr. Birdie Sons.  The patient called today with reports of hypertension.  08/17/22: - Seen by his PCP  - he was on atenolol and lisinopril. - lisinopril d/c'ed and he was started on Valsartan 160 mg once daily  09/11/22:  - Seen by his PCP - BP's at that time documented at "adequately controlled at home" - Advised to continue Atenolol 25 mg once daily & Valsartan 160 mg once daily  The patient is concerned that is BP is still running high with the most recent addition of Valsartan 160 mg once daily  He reported blood pressure readings from home (various times of day) over the last 2 weeks: 147/77 182/73 156/789 146/85 147/76 160/85 131/64 182/85 139/70 144/79 149/81 178/81  The patient confirms he takes both Atenolol 25 mg once daily at bedtime & Valsartan 160 mg once daily at bedtime. He states Dr. Birdie Sons recommended he take both medications at night.  He still reports some mild headaches and episodes of lightheadedness.  I advised the patient: 1) Since it has been ~ 1.5 years since we saw him in our clinic, we will need to schedule him an office visit prior to making any further recommendations Or 2) He may call Dr. Birdie Sons for further advisement  Karl Myers prefers to be seen by Cardiology and has been scheduled for a next available appointment on 09/29/22 with Karl Quest, NP at 2:45 pm. He is aware to call us or his PCP if symptoms worsen in the interim. The patient voices understanding and is agreeable.

## 2022-09-29 ENCOUNTER — Other Ambulatory Visit: Payer: Self-pay | Admitting: Family Medicine

## 2022-09-29 ENCOUNTER — Ambulatory Visit: Payer: Medicare HMO | Admitting: Cardiology

## 2022-09-29 DIAGNOSIS — G479 Sleep disorder, unspecified: Secondary | ICD-10-CM

## 2022-09-29 NOTE — Telephone Encounter (Signed)
LOV: 09/11/22  NOV: 03/15/23

## 2022-10-27 ENCOUNTER — Other Ambulatory Visit: Payer: Self-pay | Admitting: Cardiovascular Disease

## 2022-10-27 DIAGNOSIS — I480 Paroxysmal atrial fibrillation: Secondary | ICD-10-CM

## 2022-10-28 ENCOUNTER — Telehealth: Payer: Self-pay | Admitting: Cardiovascular Disease

## 2022-10-28 NOTE — Telephone Encounter (Addendum)
Eliquis 5mg  refill request received. Patient is 77 years old, weight-72.6kg, Crea-1.07 on 09/11/22, Diagnosis-Afib, and last seen by Dr. Mariah Milling on 03/24/21-PT NEEDS AN APPT. Dose is appropriate based on dosing criteria.   Pt needs an appointment with Cardiologist.   Pt has an appt with Dr. Mariah Milling on 12/07/22. Will send in a refill to requested pharmacy at this time.

## 2022-10-28 NOTE — Telephone Encounter (Signed)
-----   Message from Candance B Hemphill, RN sent at 10/28/2022  7:28 AM EDT ----- Regarding: Needs Appointment Good morning,   This pt is overdue to see his Cardiologist. He was last seen on 03/24/21 by Dr. Gollan.  Can you please reach out to the pt for an appointment, please?  Thanks in advance,  Candance  

## 2022-10-28 NOTE — Telephone Encounter (Signed)
Refill request

## 2022-10-28 NOTE — Telephone Encounter (Signed)
-----   Message from Kandace Parkins, RN sent at 10/28/2022  7:28 AM EDT ----- Regarding: Needs Appointment Good morning,   This pt is overdue to see his Cardiologist. He was last seen on 03/24/21 by Dr. Mariah Milling.  Can you please reach out to the pt for an appointment, please?  Thanks in advance,  [address]

## 2022-10-28 NOTE — Telephone Encounter (Signed)
Unable to leave voice mail due to mailbox being full. Pt needs overdue follow up appointment.

## 2022-10-29 ENCOUNTER — Ambulatory Visit: Payer: PPO | Admitting: Dermatology

## 2022-10-30 ENCOUNTER — Inpatient Hospital Stay: Payer: Medicare HMO | Attending: Oncology

## 2022-10-30 ENCOUNTER — Other Ambulatory Visit: Payer: Self-pay | Admitting: Family Medicine

## 2022-10-30 DIAGNOSIS — G479 Sleep disorder, unspecified: Secondary | ICD-10-CM

## 2022-10-30 DIAGNOSIS — F17210 Nicotine dependence, cigarettes, uncomplicated: Secondary | ICD-10-CM | POA: Diagnosis not present

## 2022-10-30 DIAGNOSIS — Z85820 Personal history of malignant melanoma of skin: Secondary | ICD-10-CM | POA: Insufficient documentation

## 2022-10-30 DIAGNOSIS — Z8042 Family history of malignant neoplasm of prostate: Secondary | ICD-10-CM | POA: Diagnosis not present

## 2022-10-30 DIAGNOSIS — E538 Deficiency of other specified B group vitamins: Secondary | ICD-10-CM | POA: Diagnosis not present

## 2022-10-30 DIAGNOSIS — D7589 Other specified diseases of blood and blood-forming organs: Secondary | ICD-10-CM | POA: Diagnosis not present

## 2022-10-30 DIAGNOSIS — Z803 Family history of malignant neoplasm of breast: Secondary | ICD-10-CM | POA: Diagnosis not present

## 2022-10-30 LAB — CBC WITH DIFFERENTIAL/PLATELET
Abs Immature Granulocytes: 0.07 10*3/uL (ref 0.00–0.07)
Basophils Absolute: 0.1 10*3/uL (ref 0.0–0.1)
Basophils Relative: 1 %
Eosinophils Absolute: 0.7 10*3/uL — ABNORMAL HIGH (ref 0.0–0.5)
Eosinophils Relative: 7 %
HCT: 50.1 % (ref 39.0–52.0)
Hemoglobin: 16.5 g/dL (ref 13.0–17.0)
Immature Granulocytes: 1 %
Lymphocytes Relative: 27 %
Lymphs Abs: 2.7 10*3/uL (ref 0.7–4.0)
MCH: 33.1 pg (ref 26.0–34.0)
MCHC: 32.9 g/dL (ref 30.0–36.0)
MCV: 100.6 fL — ABNORMAL HIGH (ref 80.0–100.0)
Monocytes Absolute: 1.1 10*3/uL — ABNORMAL HIGH (ref 0.1–1.0)
Monocytes Relative: 11 %
Neutro Abs: 5.3 10*3/uL (ref 1.7–7.7)
Neutrophils Relative %: 53 %
Platelets: 192 10*3/uL (ref 150–400)
RBC: 4.98 MIL/uL (ref 4.22–5.81)
RDW: 14.1 % (ref 11.5–15.5)
WBC: 9.9 10*3/uL (ref 4.0–10.5)
nRBC: 0 % (ref 0.0–0.2)

## 2022-10-30 LAB — COMPREHENSIVE METABOLIC PANEL
ALT: 17 U/L (ref 0–44)
AST: 23 U/L (ref 15–41)
Albumin: 4.2 g/dL (ref 3.5–5.0)
Alkaline Phosphatase: 86 U/L (ref 38–126)
Anion gap: 7 (ref 5–15)
BUN: 15 mg/dL (ref 8–23)
CO2: 27 mmol/L (ref 22–32)
Calcium: 9.1 mg/dL (ref 8.9–10.3)
Chloride: 103 mmol/L (ref 98–111)
Creatinine, Ser: 1.15 mg/dL (ref 0.61–1.24)
GFR, Estimated: >60 mL/min (ref >60)
Glucose, Bld: 112 mg/dL — ABNORMAL HIGH (ref 70–99)
Potassium: 4.7 mmol/L (ref 3.5–5.1)
Sodium: 137 mmol/L (ref 135–145)
Total Bilirubin: 0.8 mg/dL (ref 0.3–1.2)
Total Protein: 7 g/dL (ref 6.5–8.1)

## 2022-10-30 LAB — FOLATE: Folate: 14.9 ng/mL (ref >5.9)

## 2022-10-30 LAB — VITAMIN B12: Vitamin B-12: 840 pg/mL (ref 180–914)

## 2022-11-04 ENCOUNTER — Encounter: Payer: Self-pay | Admitting: Oncology

## 2022-11-04 ENCOUNTER — Inpatient Hospital Stay: Payer: Medicare HMO

## 2022-11-04 ENCOUNTER — Inpatient Hospital Stay: Payer: Medicare HMO | Admitting: Oncology

## 2022-11-04 VITALS — BP 140/72 | HR 56 | Temp 96.0°F | Resp 18 | Wt 162.4 lb

## 2022-11-04 DIAGNOSIS — Z85820 Personal history of malignant melanoma of skin: Secondary | ICD-10-CM | POA: Diagnosis not present

## 2022-11-04 DIAGNOSIS — E538 Deficiency of other specified B group vitamins: Secondary | ICD-10-CM | POA: Diagnosis not present

## 2022-11-04 DIAGNOSIS — D7589 Other specified diseases of blood and blood-forming organs: Secondary | ICD-10-CM

## 2022-11-04 DIAGNOSIS — F17210 Nicotine dependence, cigarettes, uncomplicated: Secondary | ICD-10-CM | POA: Diagnosis not present

## 2022-11-04 DIAGNOSIS — Z8042 Family history of malignant neoplasm of prostate: Secondary | ICD-10-CM | POA: Diagnosis not present

## 2022-11-04 DIAGNOSIS — Z803 Family history of malignant neoplasm of breast: Secondary | ICD-10-CM | POA: Diagnosis not present

## 2022-11-04 NOTE — Assessment & Plan Note (Signed)
B12 has improved. Recommend patient to take Vitamin B12 2-3 times per week.

## 2022-11-04 NOTE — Progress Notes (Signed)
Hematology/Oncology Consult note [phone] [phone]      Patient Care Team: Glori Luis, MD as PCP - General (Family Medicine) Mariah Milling Tollie Pizza, MD as PCP - Cardiology (Cardiology)   REFERRING PROVIDER: Glori Luis, MD  CHIEF COMPLAINTS/REASON FOR VISIT:  Elevated MCV  ASSESSMENT & PLAN:  Macrocytosis Chronic macrocytosis.  Stable.  No anemia. Previous workup negative for hemolysis, normal multiple myeloma workup Patient is asymptomatic and macrocytosis is very mild.  I will hold off bone marrow biopsy Recommend observation.   Low serum vitamin B12 B12 has improved. Recommend patient to take Vitamin B12 2-3 times per week.  Orders Placed This Encounter  Procedures   CBC with Differential (Cancer Center Only)    Standing Status:   Future    Standing Expiration Date:   11/04/2023   Vitamin B12    Standing Status:   Future    Standing Expiration Date:   11/04/2023   Folate    Standing Status:   Future    Standing Expiration Date:   11/04/2023   Lactate dehydrogenase    Standing Status:   Future    Standing Expiration Date:   11/04/2023   Follow up in 6 months All questions were answered. The patient knows to call the clinic with any problems, questions or concerns.  Karl Patience, MD, PhD [facility] Hematology Oncology 11/04/2022     HISTORY OF PRESENTING ILLNESS:  Karl Myers is a  77 y.o.  male with PMH listed below who was referred to me for elevated MCV Reviewed patient's recent labs that was done.  He was found to have abnormal CBC on 03/19/22 with normal hemoglobin 15.2, MCV 102.4 Reviewed patient's previous labs ordered by primary care physician's office, anemia is chronic onset , duration is since 2022 He had not noticed any recent bleeding such as epistaxis, hematuria or hematochezia.  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  Denies alcohol use.  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 male who has above history reviewed by me today presents for follow up visit for macrocytosis.  Patient takes vitamin B12 supplementation.  He has no new complaints.   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   Pre-diabetes     SURGICAL HISTORY: Past Surgical History:  Procedure Laterality Date   BACK SURGERY     x 3   CARPAL TUNNEL RELEASE Right 08/19/2017   Procedure: CARPAL  TUNNEL RELEASE;  Surgeon: Kennedy Bucker, MD;  Location: [facility] ORS;  Service: Orthopedics;  Laterality: Right;   CATARACT EXTRACTION W/PHACO Right 11/17/2016   Procedure: CATARACT EXTRACTION PHACO AND INTRAOCULAR LENS PLACEMENT (IOC);  Surgeon: Galen Manila, MD;  Location: [facility] ORS;  Service: Ophthalmology;  Laterality: Right;  [place] 00:37 AP% 12.6 CDE 4.69 Fluid pack lot # [id number]   CATARACT EXTRACTION W/PHACO Left 12/08/2016   Procedure: CATARACT EXTRACTION PHACO AND INTRAOCULAR LENS PLACEMENT (IOC);  Surgeon: Galen Manila, MD;  Location:  [facility] ORS;  Service: Ophthalmology;  Laterality: Left;  [place] 00:33 AP% 13.9 CDE 4.63 Fluid pack lot # [id number]   TOE AMPUTATION Left    ULNAR TUNNEL RELEASE Right 08/19/2017   Procedure: CUBITAL TUNNEL RELEASE;  Surgeon: Kennedy Bucker, MD;  Location: [facility] ORS;  Service: Orthopedics;  Laterality: Right;    SOCIAL HISTORY: Social History   Socioeconomic History   Marital status: Married    Spouse name: Not on file   Number of children: Not on file   Years of education: Not on file   Highest education level: Not on file  Occupational History   Not on file  Tobacco Use   Smoking status: Every Day    Packs/day: 0.25    Years: 65.00    Additional pack years: 0.00    Total pack years: 16.25    Types: Cigars, Cigarettes   Smokeless tobacco: Never   Tobacco comments:    0.5PPD 12/03/2021  Vaping Use   Vaping Use: Never used  Substance and Sexual Activity   Alcohol use: No   Drug use: No   Sexual activity: Not on file  Other Topics Concern   Not on file  Social History Narrative   Not on file   Social Determinants of Health   Financial Resource Strain: Low Risk  (03/24/2022)   Overall Financial Resource Strain (CARDIA)    Difficulty of Paying Living Expenses: Not hard at all  Food Insecurity: No Food Insecurity (03/24/2022)   Hunger Vital Sign    Worried About Running Out of Food in the Last Year: Never true    Ran Out of Food in the Last Year: Never true  Transportation Needs: No Transportation Needs (03/24/2022)   PRAPARE - Administrator, Civil Service (Medical): No    Lack of Transportation (Non-Medical): No  Physical Activity: Unknown (02/02/2018)   Exercise Vital Sign    Days of Exercise per Week: 0 days    Minutes of Exercise per Session: Not on file  Stress: No Stress Concern Present (03/24/2022)   [employer] of Occupational Health - Occupational Stress Questionnaire    Feeling of Stress : Not at all  Social Connections: Unknown (03/24/2022)    Social Connection and Isolation Panel [NHANES]    Frequency of Communication with Friends and Family: Not on file    Frequency of Social Gatherings with Friends and Family: Not on file    Attends Religious Services: Not on file    Active Member of Clubs or Organizations: Not on file    Attends Banker Meetings: Not on file    Marital Status: Married  Intimate Partner Violence: Not At Risk (03/24/2022)   Humiliation, Afraid, Rape, and Kick questionnaire    Fear of Current or Ex-Partner: No    Emotionally Abused: No    Physically Abused: No    Sexually Abused: No    FAMILY HISTORY: Family History  Problem Relation Age of Onset   Heart attack  Mother    Prostate cancer Father    Breast cancer Sister 45   Dementia Sister    Cancer Brother        unknown what kind    ALLERGIES:  is allergic to hydrocodone-acetaminophen and neurontin [gabapentin].  MEDICATIONS:  Current Outpatient Medications  Medication Sig Dispense Refill   ALPRAZolam (XANAX) 0.5 MG tablet TAKE ONE TABLET BY MOUTH AT BEDTIME AS NEEDED FOR ANXIETY 30 tablet 1   apixaban (ELIQUIS) 5 MG TABS tablet TAKE ONE TABLET BY MOUTH TWICE DAILY 180 tablet 0   atenolol (TENORMIN) 25 MG tablet TAKE ONE TABLET BY MOUTH AT BEDTIME 90 tablet 3   atorvastatin (LIPITOR) 80 MG tablet TAKE 1 TABLET BY MOUTH DAILY 90 tablet 1   cyanocobalamin (VITAMIN B12) 1000 MCG tablet Take 1 tablet (1,000 mcg total) by mouth daily. 90 tablet 1   fluorouracil (EFUDEX) 5 % cream Apply topically 2 (two) times daily. Starting on May 01, 2022 apply bid to forehead and temples for 7 days 30 g 2   Fluticasone-Umeclidin-Vilant (TRELEGY ELLIPTA) 100-62.5-25 MCG/ACT AEPB Inhale 1 puff into the lungs daily. 28 each 11   gabapentin (NEURONTIN) 100 MG capsule TAKE 1 CAPSULE BY MOUTH 3 TIMES DAILY 270 capsule 1   mupirocin ointment (BACTROBAN) 2 % Apply to affected area after cleansing followed by bandage every 2-3 days. 22 g 0   nitroGLYCERIN  (NITROSTAT) 0.4 MG SL tablet Place 1 tablet (0.4 mg total) under the tongue every 5 (five) minutes as needed for chest pain. For up to 3 doses per episode. 50 tablet 0   promethazine-dextromethorphan (PROMETHAZINE-DM) 6.25-15 MG/5ML syrup Take 5 mLs by mouth 4 (four) times daily as needed for cough. 118 mL 0   Spacer/Aero-Holding Chambers (AEROCHAMBER PLUS) inhaler Use with inhaler 1 each 2   tacrolimus (PROTOPIC) 0.1 % ointment Apply topically 2 (two) times daily. Bid to aa buttocks until resolved 60 g 1   terbinafine (LAMISIL) 250 MG tablet Take 1 tablet (250 mg total) by mouth daily. 30 tablet 0   traZODone (DESYREL) 150 MG tablet TAKE ONE TABLET AT BEDTIME 90 tablet 1   valsartan (DIOVAN) 160 MG tablet Take 1 tablet (160 mg total) by mouth daily. 90 tablet 3   No current facility-administered medications for this visit.    Review of Systems  Constitutional:  Negative for appetite change, chills, fatigue, fever and unexpected weight change.  HENT:   Negative for hearing loss and voice change.   Eyes:  Negative for eye problems and icterus.  Respiratory:  Negative for chest tightness, cough and shortness of breath.   Cardiovascular:  Negative for chest pain and leg swelling.  Gastrointestinal:  Negative for abdominal distention and abdominal pain.  Endocrine: Negative for hot flashes.  Genitourinary:  Negative for difficulty urinating, dysuria and frequency.   Musculoskeletal:  Negative for arthralgias.       History of melanoma s/p right 5th toe amputation.   Skin:  Negative for itching and rash.  Neurological:  Negative for light-headedness and numbness.  Hematological:  Negative for adenopathy. Does not bruise/bleed easily.  Psychiatric/Behavioral:  Negative for confusion.     PHYSICAL EXAMINATION: ECOG PERFORMANCE STATUS: 1 - Symptomatic but completely ambulatory Vitals:   11/04/22 0958  BP: (!) 140/72  Pulse: (!) 56  Resp: 18  Temp: (!) 96 F (35.6 C)  SpO2: 99%   Filed  Weights   11/04/22 0958  Weight: 162 lb 6.4 oz (73.7 kg)    Physical Exam  Constitutional:      General: He is not in acute distress. HENT:     Head: Normocephalic and atraumatic.  Eyes:     General: No scleral icterus. Cardiovascular:     Rate and Rhythm: Normal rate and regular rhythm.     Heart sounds: Normal heart sounds.  Pulmonary:     Effort: Pulmonary effort is normal. No respiratory distress.     Breath sounds: No wheezing.  Abdominal:     General: Bowel sounds are normal. There is no distension.     Palpations: Abdomen is soft.  Musculoskeletal:        General: No deformity. Normal range of motion.     Cervical back: Normal range of motion and neck supple.  Skin:    General: Skin is warm and dry.     Findings: No erythema or rash.  Neurological:     Mental Status: He is alert and oriented to person, place, and time. Mental status is at baseline.     Cranial Nerves: No cranial nerve deficit.     Coordination: Coordination normal.  Psychiatric:        Mood and Affect: Mood normal.      LABORATORY DATA:  I have reviewed the data as listed    Latest Ref Rng & Units 10/30/2022    7:53 AM 03/26/2022   11:53 AM 03/19/2022    9:23 AM  CBC  WBC 4.0 - 10.5 K/uL 9.9  20.8  8.4   Hemoglobin 13.0 - 17.0 g/dL 16.1  09.6  04.5   Hematocrit 39.0 - 52.0 % 50.1  48.7  45.8   Platelets 150 - 400 K/uL 192  226  172.0       Latest Ref Rng & Units 10/30/2022    7:53 AM 09/11/2022    8:59 AM 09/01/2022   10:44 AM  CMP  Glucose 70 - 99 mg/dL 409  811  914   BUN 8 - 23 mg/dL 15  13  13    Creatinine 0.61 - 1.24 mg/dL 7.82  9.56  2.13   Sodium 135 - 145 mmol/L 137  140  140   Potassium 3.5 - 5.1 mmol/L 4.7  4.4  4.5   Chloride 98 - 111 mmol/L 103  103  102   CO2 22 - 32 mmol/L 27  30  33   Calcium 8.9 - 10.3 mg/dL 9.1  9.0  9.1   Total Protein 6.5 - 8.1 g/dL 7.0  6.2    Total Bilirubin 0.3 - 1.2 mg/dL 0.8  0.6    Alkaline Phos 38 - 126 U/L 86  92    AST 15 - 41 U/L 23  20     ALT 0 - 44 U/L 17  18        Component Value Date/Time   FERRITIN 469 (H) 07/26/2019 0330     RADIOGRAPHIC STUDIES: I have personally reviewed the radiological images as listed and agreed with the findings in the report. No results found.

## 2022-11-04 NOTE — Assessment & Plan Note (Addendum)
Chronic macrocytosis.  Stable.  No anemia. Previous workup negative for hemolysis, normal multiple myeloma workup Patient is asymptomatic and macrocytosis is very mild.  I will hold off bone marrow biopsy Recommend observation.

## 2022-11-17 ENCOUNTER — Ambulatory Visit (INDEPENDENT_AMBULATORY_CARE_PROVIDER_SITE_OTHER): Payer: Medicare HMO | Admitting: Dermatology

## 2022-11-17 VITALS — BP 117/57 | HR 58

## 2022-11-17 DIAGNOSIS — L578 Other skin changes due to chronic exposure to nonionizing radiation: Secondary | ICD-10-CM | POA: Diagnosis not present

## 2022-11-17 DIAGNOSIS — D229 Melanocytic nevi, unspecified: Secondary | ICD-10-CM

## 2022-11-17 DIAGNOSIS — D1801 Hemangioma of skin and subcutaneous tissue: Secondary | ICD-10-CM | POA: Diagnosis not present

## 2022-11-17 DIAGNOSIS — B353 Tinea pedis: Secondary | ICD-10-CM

## 2022-11-17 DIAGNOSIS — L821 Other seborrheic keratosis: Secondary | ICD-10-CM

## 2022-11-17 DIAGNOSIS — L570 Actinic keratosis: Secondary | ICD-10-CM | POA: Diagnosis not present

## 2022-11-17 DIAGNOSIS — Z85820 Personal history of malignant melanoma of skin: Secondary | ICD-10-CM

## 2022-11-17 DIAGNOSIS — W908XXA Exposure to other nonionizing radiation, initial encounter: Secondary | ICD-10-CM

## 2022-11-17 DIAGNOSIS — B351 Tinea unguium: Secondary | ICD-10-CM

## 2022-11-17 DIAGNOSIS — L814 Other melanin hyperpigmentation: Secondary | ICD-10-CM | POA: Diagnosis not present

## 2022-11-17 DIAGNOSIS — Z1283 Encounter for screening for malignant neoplasm of skin: Secondary | ICD-10-CM | POA: Diagnosis not present

## 2022-11-17 DIAGNOSIS — D692 Other nonthrombocytopenic purpura: Secondary | ICD-10-CM | POA: Diagnosis not present

## 2022-11-17 DIAGNOSIS — X32XXXA Exposure to sunlight, initial encounter: Secondary | ICD-10-CM | POA: Diagnosis not present

## 2022-11-17 DIAGNOSIS — Z872 Personal history of diseases of the skin and subcutaneous tissue: Secondary | ICD-10-CM

## 2022-11-17 MED ORDER — KETOCONAZOLE 2 % EX CREA: TOPICAL_CREAM | CUTANEOUS | 2 refills | Status: DC

## 2022-11-17 NOTE — Patient Instructions (Addendum)
Recommend daily broad spectrum sunscreen SPF 30+ to sun-exposed areas, reapply every 2 hours as needed. Call for new or changing lesions.  Staying in the shade or wearing long sleeves, sun glasses (UVA+UVB protection) and wide brim hats (4-inch brim around the entire circumference of the hat) are also recommended for sun protection.   Cryotherapy Aftercare  Wash gently with soap and water everyday.   Apply Vaseline and Band-Aid daily until healed.   Seborrheic Keratosis  What causes seborrheic keratoses? Seborrheic keratoses are harmless, common skin growths that first appear during adult life.  As time goes by, more growths appear.  Some people may develop a large number of them.  Seborrheic keratoses appear on both covered and uncovered body parts.  They are not caused by sunlight.  The tendency to develop seborrheic keratoses can be inherited.  They vary in color from skin-colored to gray, brown, or even black.  They can be either smooth or have a rough, warty surface.   Seborrheic keratoses are superficial and look as if they were stuck on the skin.  Under the microscope this type of keratosis looks like layers upon layers of skin.  That is why at times the top layer may seem to fall off, but the rest of the growth remains and re-grows.    Treatment Seborrheic keratoses do not need to be treated, but can easily be removed in the office.  Seborrheic keratoses often cause symptoms when they rub on clothing or jewelry.  Lesions can be in the way of shaving.  If they become inflamed, they can cause itching, soreness, or burning.  Removal of a seborrheic keratosis can be accomplished by freezing, burning, or surgery. If any spot bleeds, scabs, or grows rapidly, please return to have it checked, as these can be an indication of a skin cancer.  Due to recent changes in healthcare laws, you may see results of your pathology and/or laboratory studies on MyChart before the doctors have had a chance to  review them. We understand that in some cases there may be results that are confusing or concerning to you. Please understand that not all results are received at the same time and often the doctors may need to interpret multiple results in order to provide you with the best plan of care or course of treatment. Therefore, we ask that you please give [place] 2 business days to thoroughly review all your results before contacting the office for clarification. Should we see a critical lab result, you will be contacted sooner.   If You Need Anything After Your Visit  If you have any questions or concerns for your doctor, please call our main line at [phone] and press option 4 to reach your doctor's medical assistant. If no one answers, please leave a voicemail as directed and we will return your call as soon as possible. Messages left after 4 pm will be answered the following business day.   You may also send [place] a message via MyChart. We typically respond to MyChart messages within 1-2 business days.  For prescription refills, please ask your pharmacy to contact our office. Our fax number is [phone].  If you have an urgent issue when the clinic is closed that cannot wait until the next business day, you can page your doctor at the number below.    Please note that while we do our best to be available for urgent issues outside of office hours, we are not available 24/7.   If you  have an urgent issue and are unable to reach [place], you may choose to seek medical care at your doctor's office, retail clinic, urgent care center, or emergency room.  If you have a medical emergency, please immediately call 911 or go to the emergency department.  Pager Numbers  - Dr. Gwen Pounds: [phone]  - Dr. Neale Burly: [phone]  - Dr. Roseanne Reno: [phone]  In the event of inclement weather, please call our main line at [phone] for an update on the status of any delays or closures.  Dermatology Medication  Tips: Please keep the boxes that topical medications come in in order to help keep track of the instructions about where and how to use these. Pharmacies typically print the medication instructions only on the boxes and not directly on the medication tubes.   If your medication is too expensive, please contact our office at [phone] option 4 or send [place] a message through MyChart.   We are unable to tell what your co-pay for medications will be in advance as this is different depending on your insurance coverage. However, we may be able to find a substitute medication at lower cost or fill out paperwork to get insurance to cover a needed medication.   If a prior authorization is required to get your medication covered by your insurance company, please allow [place] 1-2 business days to complete this process.  Drug prices often vary depending on where the prescription is filled and some pharmacies may offer cheaper prices.  The website www.goodrx.com contains coupons for medications through different pharmacies. The prices here do not account for what the cost may be with help from insurance (it may be cheaper with your insurance), but the website can give you the price if you did not use any insurance.  - You can print the associated coupon and take it with your prescription to the pharmacy.  - You may also stop by our office during regular business hours and pick up a GoodRx coupon card.  - If you need your prescription sent electronically to a different pharmacy, notify our office through [facility] or by phone at [phone] option 4.     Si Usted Necesita Algo Despus de Su Visita  Tambin puede enviarnos un mensaje a travs de Clinical cytogeneticist. Por lo general respondemos a los mensajes de MyChart en el transcurso de 1 a 2 das hbiles.  Para renovar recetas, por favor pida a su farmacia que se ponga en contacto con nuestra oficina. Annie Sable de fax es [place] [phone].  Si tiene un  asunto urgente cuando la clnica est cerrada y que no puede esperar hasta el siguiente da hbil, puede llamar/localizar a su doctor(a) al nmero que aparece a continuacin.   Por favor, tenga en cuenta que aunque hacemos todo lo posible para estar disponibles para asuntos urgentes fuera del horario de [place], no estamos disponibles las 24 horas del da, los 7 [address]  [address] de la [place].   Si tiene un problema urgente y no puede comunicarse con nosotros, puede optar por buscar atencin mdica  en el consultorio de su doctor(a), en una clnica privada, en un centro de atencin urgente o en una sala de emergencias.  Si tiene Engineer, drilling, por favor llame inmediatamente al 911 o vaya a la sala de emergencias.  Nmeros de bper  - Dr. Gwen Pounds: [phone]  - Dra. Moye: [phone]  - Dra. Roseanne Reno: [phone]  En caso de inclemencias del tiempo, por favor llame a nuestra lnea principal al [phone]  para [employer] de cualquier retraso o cierre.  Consejos para la medicacin en dermatologa: Por favor, guarde las cajas en las que vienen los medicamentos de uso tpico para ayudarle a seguir las instrucciones sobre dnde y cmo usarlos. Las farmacias generalmente imprimen las instrucciones del medicamento slo en las cajas y no directamente en los tubos del [place].   Si su medicamento es muy caro, por favor, pngase en contacto con Rolm [place]ala llamando al [phone] y presione la opcin 4 o envenos un mensaje a travs de Clinical cytogeneticist.   No podemos decirle cul ser su copago por los medicamentos por adelantado ya que esto es diferente dependiendo de la cobertura de su seguro. Sin embargo, es posible que podamos encontrar un medicamento sustituto a Audiological scientist un formulario para que el seguro cubra el medicamento que se considera necesario.   Si se requiere una autorizacin previa para que su compaa de seguros [place] su medicamento, por favor  permtanos de 1 a 2 das hbiles para completar [address]  Los precios de los medicamentos varan con frecuencia dependiendo del Environmental consultant de dnde se surte la receta y alguna farmacias pueden ofrecer precios ms baratos.  El sitio web www.goodrx.com tiene cupones para medicamentos de Health and safety inspector. Los precios aqu no tienen en cuenta lo que podra costar con la ayuda del seguro (puede ser ms barato con su seguro), pero el sitio web puede darle el precio si no utiliz Tourist information centre manager.  - Puede imprimir el cupn correspondiente y llevarlo con su receta a la farmacia.  - Tambin puede pasar por nuestra oficina durante el horario de atencin regular y Education officer, museum una tarjeta de cupones de GoodRx.  - Si necesita que su receta se enve electrnicamente a una farmacia diferente, informe a nuestra oficina a travs de MyChart de [place] o por telfono llamando al [phone] y presione la opcin 4.

## 2022-11-17 NOTE — Progress Notes (Signed)
Follow-Up Visit   Subjective  Karl Myers is a 77 y.o. male who presents for the following: Skin Cancer Screening and Full Body Skin Exam  The patient presents for Total-Body Skin Exam (TBSE) for skin cancer screening and mole check. The patient has spots, moles and lesions to be evaluated, some may be new or changing. 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 History of Aks and he has used 5FU/Calcipotriene cream with a good reaction.    The following portions of the chart were reviewed this encounter and updated as appropriate: medications, allergies, medical history  Review of Systems:  No other skin or systemic complaints except as noted in HPI or Assessment and Plan.  Objective  Well appearing patient in no apparent distress; mood and affect are within normal limits.  A full examination was performed including scalp, head, eyes, ears, nose, lips, neck, chest, axillae, abdomen, back, buttocks, bilateral upper extremities, bilateral lower extremities, hands, feet, fingers, toes, fingernails, and toenails. All findings within normal limits unless otherwise noted below.   Relevant physical exam findings are noted in the Assessment and Plan.  Left temple, L nasal dorsum, R alar rim (3), vertex scalp x 4 (4) Pink scaly macules.     Assessment & Plan   LENTIGINES, SEBORRHEIC KERATOSES, HEMANGIOMAS - Benign normal skin lesions - Benign-appearing - Call for any changes  MELANOCYTIC NEVI - Tan-brown and/or pink-flesh-colored symmetric macules and papules - Benign appearing on exam today - Observation - Call clinic for new or changing moles - Recommend daily use of broad spectrum spf 30+ sunscreen to sun-exposed areas.   ACTINIC DAMAGE - Chronic condition, secondary to cumulative UV/sun exposure - diffuse scaly erythematous macules with underlying dyspigmentation - Recommend daily broad spectrum sunscreen SPF 30+ to sun-exposed areas, reapply  every 2 hours as needed.  - Staying in the shade or wearing long sleeves, sun glasses (UVA+UVB protection) and wide brim hats (4-inch brim around the entire circumference of the hat) are also recommended for sun protection.  - Call for new or changing lesions.  SKIN CANCER SCREENING PERFORMED TODAY.  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  - Call if any new or changing lesions are noted between office visits  Purpura - Chronic; persistent and recurrent.  Treatable, but not curable. - Violaceous macules and patches - Benign - Related to trauma, age, sun damage and/or use of blood thinners, chronic use of topical and/or oral steroids - Observe - Can use OTC arnica containing moisturizer such as Dermend Bruise Formula if desired - Call for worsening or other concerns  TINEA UNGUIUM WITH TINEA PEDIS  Exam: Toenail dystrophy with yellow discoloration and thickening  Chronic and persistent condition with duration or expected duration over one year. Condition is symptomatic/ bothersome to patient. Not currently at goal.  Treatment Plan: Continue ketoconazole 2% cream Apply to feet and between toes every night.   AK (actinic keratosis) (7) Left temple, L nasal dorsum, R alar rim (3); vertex scalp x 4 (4)  Face not treated today due to upcoming trip. Treat on f/u.   Actinic keratoses are precancerous spots that appear secondary to cumulative UV radiation exposure/sun exposure over  time. They are chronic with expected duration over 1 year. A portion of actinic keratoses will progress to squamous cell carcinoma of the skin. It is not possible to reliably predict which spots will progress to skin cancer and so  treatment is recommended to prevent development of skin cancer.  Recommend daily broad spectrum sunscreen SPF 30+ to sun-exposed areas, reapply every 2 hours as needed.  Recommend staying in the shade or wearing long sleeves, sun glasses (UVA+UVB protection) and wide brim hats (4-inch brim around the entire circumference of the hat). Call for new or changing lesions.  Destruction of lesion - vertex scalp x 4  Destruction method: cryotherapy   Informed consent: discussed and consent obtained   Lesion destroyed using liquid nitrogen: Yes   Region frozen until ice ball extended beyond lesion: Yes   Outcome: patient tolerated procedure well with no complications   Post-procedure details: wound care instructions given   Additional details:  Prior to procedure, discussed risks of blister formation, small wound, skin dyspigmentation, or rare scar following cryotherapy. Recommend Vaseline ointment to treated areas while healing.   Skin cancer screening  Actinic skin damage  Nevus  History of melanoma  Seborrheic keratosis  Lentigo  Tinea pedis of both feet  Tinea unguium  Senile purpura (HCC)   Return in about 6 months (around 05/19/2023) for TBSE, Hx melanoma, Hx AKs.  ICherlyn Labella, CMA, am acting as scribe for Willeen Niece, MD .   Documentation: I have reviewed the above documentation for accuracy and completeness, and I agree with the above.  Willeen Niece, MD

## 2022-11-27 ENCOUNTER — Other Ambulatory Visit: Payer: Self-pay | Admitting: Family Medicine

## 2022-11-27 DIAGNOSIS — G479 Sleep disorder, unspecified: Secondary | ICD-10-CM

## 2022-12-06 NOTE — Progress Notes (Unsigned)
Cardiology Office Note  Date:  12/07/2022   ID:  Karl, Myers [birth year], MRN [id number]  PCP:  Glori Luis, MD   Chief Complaint  Patient presents with   Follow-up    Patient c/o symptoms of confusion within the past two weeks; [place] and forgot where his bedroom was. Medications reviewed by the patient verbally.     HPI:  [name] is a 77 year old gentleman with past medical history of HTN Smoker, quit last month.  Smoked for 50 years or more Anxiety Coronary artery disease on previous CT scan Fatigue, with exertion, Who presents for f/u of his fatigue, coronary artery disease, afib in setting of covid  Last seen in clinic October 2022  Works in garden, on his knees from 6 AM to 9 AM  "Problem is my mind", recent travel to [place]. Karl Myers with daughter, lived in a condo While staying at the condo could not find the bathroom, felt confused at times On returning back home went to [facility] After getting home from [facility], somebody knocked on his door, felt he was still in [place] on conversation with the neighbor  No regular exercise program apart from working in the garden  Eliquis 5 twice daily On last clinic visit had started smoking again 1 pack/day Declined Chantix  Previous lab work reviewed with him in detail Total chol 131, LDL 65 HBA1C 6.2  EKG personally reviewed by myself on todays visit EKG Interpretation Date/Time:  Monday December 07 2022 11:41:57 EDT Ventricular Rate:  71 PR Interval:  190 QRS Duration:  76 QT Interval:  380 QTC Calculation: 412 R Axis:   -6  Text Interpretation: Normal sinus rhythm Normal ECG When compared with ECG of 22-May-2021 14:56, Minimal criteria for Anteroseptal infarct are no longer Present Non-specific change in ST segment in Anterior leads Confirmed by Julien Nordmann [phone]) on 12/07/2022 12:01:51 PM   covid 07/2019 bilateral multifocal infiltrates and hypoxia   remdesivir and steroids.  brief atrial fibrillation  Anticoagulation was started for stroke risk reduction and will continue with eliquis. 5th and final infusion of remdesivir on 2/19 and complete a course of oral decadron as outpatient. HbA1c 6.5%.   Stress test 07/30/2017, reviewed with him in detail There was no ST segment deviation noted during stress. The study is normal. This is a low risk study. The left ventricular ejection fraction is normal (55-65%).  works in the garden [employer]    PMH:   has a past medical history of Actinic keratosis, Arthritis, COPD (chronic obstructive pulmonary disease) (HCC), DDD (degenerative disc disease), cervical, DDD (degenerative disc disease), cervical, Depression, History of COVID-19, Hypertension, Melanoma (HCC) (09/04/2019), and Pre-diabetes.  PSH:    Past Surgical History:  Procedure Laterality Date   BACK SURGERY     x 3   CARPAL TUNNEL RELEASE Right 08/19/2017   Procedure: CARPAL TUNNEL RELEASE;  Surgeon: Kennedy Bucker, MD;  Location: [facility] ORS;  Service: Orthopedics;  Laterality: Right;   CATARACT EXTRACTION W/PHACO Right 11/17/2016   Procedure: CATARACT EXTRACTION PHACO AND INTRAOCULAR LENS PLACEMENT (IOC);  Surgeon: Galen Manila, MD;  Location: [facility] ORS;  Service: Ophthalmology;  Laterality: Right;  [place] 00:37 AP% 12.6 CDE 4.69 Fluid pack lot # [id number]   CATARACT EXTRACTION W/PHACO Left 12/08/2016   Procedure: CATARACT EXTRACTION PHACO AND INTRAOCULAR LENS PLACEMENT (IOC);  Surgeon: Galen Manila, MD;  Location: [facility] ORS;  Service: Ophthalmology;  Laterality: Left;  [place] 00:33  AP% 13.9 CDE 4.63 Fluid pack lot # [id number]   TOE AMPUTATION Left    ULNAR TUNNEL RELEASE Right 08/19/2017   Procedure: CUBITAL TUNNEL RELEASE;  Surgeon: Kennedy Bucker, MD;  Location: [facility] ORS;  Service: Orthopedics;  Laterality: Right;    Current Outpatient Medications  Medication Sig Dispense Refill   ALPRAZolam (XANAX) 0.5 MG tablet TAKE ONE  TABLET BY MOUTH AT BEDTIME AS NEEDED FOR ANXIETY 30 tablet 0   apixaban (ELIQUIS) 5 MG TABS tablet TAKE ONE TABLET BY MOUTH TWICE DAILY 180 tablet 0   atenolol (TENORMIN) 25 MG tablet TAKE ONE TABLET BY MOUTH AT BEDTIME 90 tablet 3   atorvastatin (LIPITOR) 80 MG tablet TAKE 1 TABLET BY MOUTH DAILY 90 tablet 1   cyanocobalamin (VITAMIN B12) 1000 MCG tablet Take 1 tablet (1,000 mcg total) by mouth daily. 90 tablet 1   gabapentin (NEURONTIN) 100 MG capsule TAKE 1 CAPSULE BY MOUTH 3 TIMES DAILY 270 capsule 1   nitroGLYCERIN (NITROSTAT) 0.4 MG SL tablet Place 1 tablet (0.4 mg total) under the tongue every 5 (five) minutes as needed for chest pain. For up to 3 doses per episode. 50 tablet 0   traZODone (DESYREL) 150 MG tablet TAKE ONE TABLET AT BEDTIME 90 tablet 1   valsartan (DIOVAN) 160 MG tablet Take 1 tablet (160 mg total) by mouth daily. 90 tablet 3   Fluticasone-Umeclidin-Vilant (TRELEGY ELLIPTA) 100-62.5-25 MCG/ACT AEPB Inhale 1 puff into the lungs daily. (Patient not taking: Reported on 12/07/2022) 28 each 11   ketoconazole (NIZORAL) 2 % cream Apply to feet and between toes every night. (Patient not taking: Reported on 12/07/2022) 60 g 2   mupirocin ointment (BACTROBAN) 2 % Apply to affected area after cleansing followed by bandage every 2-3 days. (Patient not taking: Reported on 12/07/2022) 22 g 0   promethazine-dextromethorphan (PROMETHAZINE-DM) 6.25-15 MG/5ML syrup Take 5 mLs by mouth 4 (four) times daily as needed for cough. (Patient not taking: Reported on 12/07/2022) 118 mL 0   Spacer/Aero-Holding Chambers (AEROCHAMBER PLUS) inhaler Use with inhaler (Patient not taking: Reported on 12/07/2022) 1 each 2   No current facility-administered medications for this visit.     Allergies:   Hydrocodone-acetaminophen and Neurontin [gabapentin]   Social History:  The patient  reports that he has been smoking cigars and cigarettes. He has a 16.25 pack-year smoking history. He has never used smokeless tobacco.  He reports that he does not drink alcohol and does not use drugs.   Family History:   family history includes Breast cancer (age of onset: 65) in his sister; Cancer in his brother; Dementia in his sister; Heart attack in his mother; Prostate cancer in his father.    Review of Systems: Review of Systems  Constitutional: Negative.   HENT: Negative.    Respiratory: Negative.    Cardiovascular: Negative.   Gastrointestinal: Negative.   Musculoskeletal: Negative.   Neurological: Negative.   Psychiatric/Behavioral:  Positive for memory loss.   All other systems reviewed and are negative.   PHYSICAL EXAM: VS:  BP (!) 130/52 (BP Location: Left Arm, Patient Position: Sitting, Cuff Size: Normal)   Pulse 71   Ht 5\' 8"  (1.727 m)   Wt 162 lb 2 oz (73.5 kg)   SpO2 94%   BMI 24.65 kg/m  , BMI Body mass index is 24.65 kg/m. Constitutional:  oriented to person, place, and time. No distress.  HENT:  Head: Grossly normal Eyes:  no discharge. No scleral icterus.  Neck: No JVD, no carotid  bruits  Cardiovascular: Regular rate and rhythm, no murmurs appreciated Pulmonary/Chest: Clear to auscultation bilaterally, no wheezes or rails Abdominal: Soft.  no distension.  no tenderness.  Musculoskeletal: Normal range of motion Neurological:  normal muscle tone. Coordination normal. No atrophy Skin: Skin warm and dry Psychiatric: normal affect, pleasant  Recent Labs: 01/05/2022: TSH 0.68 10/30/2022: ALT 17; BUN 15; Creatinine, Ser 1.15; Hemoglobin 16.5; Platelets 192; Potassium 4.7; Sodium 137    Lipid Panel Lab Results  Component Value Date   CHOL 131 09/11/2022   HDL 46.80 09/11/2022   LDLCALC 65 09/11/2022   TRIG 95.0 09/11/2022      Wt Readings from Last 3 Encounters:  12/07/22 162 lb 2 oz (73.5 kg)  11/04/22 162 lb 6.4 oz (73.7 kg)  09/11/22 160 lb (72.6 kg)      ASSESSMENT AND PLAN:  Coronary artery disease of native artery of native heart with stable angina pectoris  (HCC) Significant coronary disease seen on CT scan 50 years of smoking,  Cessation recommended Cholesterol at goal, denies chest pain concerning for angina, no further testing at this time  Chronic fatigue -  Prior stress test with no ischemia 2019 Prior COVID Wife reports he needs a nap in the afternoon, falling asleep at 7 PM, wakes early to go work in the garden 6 AM  Tobacco abuse We have encouraged him to continue to work on weaning his cigarettes and smoking cessation. He will continue to work on this and does not want any assistance with chantix.    Essential hypertension - Plan: EKG 12-Lead Blood pressure is well controlled on today's visit. No changes made to the medications.  Memory loss, compression Recent trip to [facility], was confused in the apartment where the bathroom was, confused when he got home from [facility], thought he was still at [employer] Wife concerned about this memory issues and confusion Recommend he talk with Dr. Birdie Sons, may need baseline studies with neurology Suspect early onset of mild disease, exacerbated by travel, stress, strange location  Mixed hyperlipidemia -  Cholesterol at goal on statin   Total encounter time more than 40 minutes  Greater than 50% was spent in counseling and coordination of care with the patient    Orders Placed This Encounter  Procedures   EKG 12-Lead     Signed, Dossie Arbour, M.D., Ph.D. 12/07/2022  [facility] [place], [place] [phone]

## 2022-12-07 ENCOUNTER — Encounter: Payer: Self-pay | Admitting: Cardiovascular Disease

## 2022-12-07 ENCOUNTER — Ambulatory Visit: Payer: Medicare HMO | Attending: Cardiovascular Disease | Admitting: Cardiovascular Disease

## 2022-12-07 VITALS — BP 130/52 | HR 71 | Ht 68.0 in | Wt 162.1 lb

## 2022-12-07 DIAGNOSIS — E782 Mixed hyperlipidemia: Secondary | ICD-10-CM | POA: Diagnosis not present

## 2022-12-07 DIAGNOSIS — I10 Essential (primary) hypertension: Secondary | ICD-10-CM

## 2022-12-07 DIAGNOSIS — Z720 Tobacco use: Secondary | ICD-10-CM | POA: Diagnosis not present

## 2022-12-07 DIAGNOSIS — I480 Paroxysmal atrial fibrillation: Secondary | ICD-10-CM | POA: Diagnosis not present

## 2022-12-07 DIAGNOSIS — I25118 Atherosclerotic heart disease of native coronary artery with other forms of angina pectoris: Secondary | ICD-10-CM | POA: Diagnosis not present

## 2022-12-07 NOTE — Patient Instructions (Signed)
Medication Instructions:  No changes  If you need a refill on your cardiac medications before your next appointment, please call your pharmacy.   Lab work: No new labs needed  Testing/Procedures: No new testing needed  Follow-Up: At [facility], you and your health needs are our priority.  As part of our continuing mission to provide you with exceptional heart care, we have created designated Provider Care Teams.  These Care Teams include your primary Cardiologist (physician) and Advanced Practice Providers (APPs -  Physician Assistants and Nurse Practitioners) who all work together to provide you with the care you need, when you need it.  You will need a follow up appointment in 12 months  Providers on your designated Care Team:   Christopher Berge, NP Ryan Dunn, PA-C Cadence Furth, PA-C  COVID-19 Vaccine Information can be found at: [email or web address] For questions related to vaccine distribution or appointments, please email [email or web address] or call [phone].   

## 2022-12-09 ENCOUNTER — Ambulatory Visit (INDEPENDENT_AMBULATORY_CARE_PROVIDER_SITE_OTHER): Payer: Medicare HMO | Admitting: Family Medicine

## 2022-12-09 ENCOUNTER — Encounter: Payer: Self-pay | Admitting: Family Medicine

## 2022-12-09 VITALS — BP 128/80 | HR 66 | Temp 97.7°F | Ht 68.0 in | Wt 163.8 lb

## 2022-12-09 DIAGNOSIS — F40298 Other specified phobia: Secondary | ICD-10-CM | POA: Diagnosis not present

## 2022-12-09 DIAGNOSIS — I10 Essential (primary) hypertension: Secondary | ICD-10-CM | POA: Diagnosis not present

## 2022-12-09 DIAGNOSIS — G479 Sleep disorder, unspecified: Secondary | ICD-10-CM | POA: Diagnosis not present

## 2022-12-09 DIAGNOSIS — J449 Chronic obstructive pulmonary disease, unspecified: Secondary | ICD-10-CM

## 2022-12-09 DIAGNOSIS — R7303 Prediabetes: Secondary | ICD-10-CM

## 2022-12-09 DIAGNOSIS — G309 Alzheimer's disease, unspecified: Secondary | ICD-10-CM | POA: Diagnosis not present

## 2022-12-09 DIAGNOSIS — R413 Other amnesia: Secondary | ICD-10-CM | POA: Diagnosis not present

## 2022-12-09 DIAGNOSIS — E782 Mixed hyperlipidemia: Secondary | ICD-10-CM | POA: Diagnosis not present

## 2022-12-09 LAB — TSH: TSH: 0.67 u[IU]/mL (ref 0.35–5.50)

## 2022-12-09 MED ORDER — ATENOLOL 25 MG PO TABS: 25.0000 mg | ORAL_TABLET | Freq: Every day | ORAL | 3 refills | Status: DC

## 2022-12-09 MED ORDER — TRAZODONE HCL 150 MG PO TABS: 150.0000 mg | ORAL_TABLET | Freq: Every day | ORAL | 1 refills | Status: DC

## 2022-12-09 MED ORDER — PROMETHAZINE-DM 6.25-15 MG/5ML PO SYRP
5.0000 mL | ORAL_SOLUTION | Freq: Four times a day (QID) | ORAL | 0 refills | Status: DC | PRN
Start: 2022-12-09 — End: 2023-03-30

## 2022-12-09 MED ORDER — ALPRAZOLAM 0.5 MG PO TABS
ORAL_TABLET | ORAL | 0 refills | Status: DC
Start: 2022-12-09 — End: 2023-01-29

## 2022-12-09 MED ORDER — GABAPENTIN 100 MG PO CAPS: 100.0000 mg | ORAL_CAPSULE | Freq: Three times a day (TID) | ORAL | 1 refills | Status: DC

## 2022-12-09 NOTE — Progress Notes (Signed)
Marikay Alar, MD Phone: [phone]  Karl Myers is a 77 y.o. male who presents today for F/U.  Memory difficulty: Patient notes this is an ongoing issue that has progressively worsened since having COVID previously.  Notes on 2 occasions recently his memory has been an issue.  They were on vacation and he could not find [place].  Another occasion occurred once he got back he stated he was in [facility] though he was at his house locally.  His wife and family are concerned about this.  Fear of falling: Patient reports he is slow down his walking pace due to being afraid of falling.  He has not recently.  He describes a slow gait.  He notes his wife is concerned about this.  He requests refill of his cough medication to have on hand in case he gets sick.  He notes he does not use this very frequently.  Social History   Tobacco Use  Smoking Status Every Day   Packs/day: 0.25   Years: 65.00   Additional pack years: 0.00   Total pack years: 16.25   Types: Cigars, Cigarettes  Smokeless Tobacco Never  Tobacco Comments   0.5PPD 12/07/2022    Current Outpatient Medications on File Prior to Visit  Medication Sig Dispense Refill   apixaban (ELIQUIS) 5 MG TABS tablet TAKE ONE TABLET BY MOUTH TWICE DAILY 180 tablet 0   atorvastatin (LIPITOR) 80 MG tablet TAKE 1 TABLET BY MOUTH DAILY 90 tablet 1   cyanocobalamin (VITAMIN B12) 1000 MCG tablet Take 1 tablet (1,000 mcg total) by mouth daily. 90 tablet 1   Fluticasone-Umeclidin-Vilant (TRELEGY ELLIPTA) 100-62.5-25 MCG/ACT AEPB Inhale 1 puff into the lungs daily. 28 each 11   ketoconazole (NIZORAL) 2 % cream Apply to feet and between toes every night. 60 g 2   mupirocin ointment (BACTROBAN) 2 % Apply to affected area after cleansing followed by bandage every 2-3 days. 22 g 0   nitroGLYCERIN (NITROSTAT) 0.4 MG SL tablet Place 1 tablet (0.4 mg total) under the tongue every 5 (five) minutes as needed for chest  pain. For up to 3 doses per episode. 50 tablet 0   Spacer/Aero-Holding Chambers (AEROCHAMBER PLUS) inhaler Use with inhaler 1 each 2   valsartan (DIOVAN) 160 MG tablet Take 1 tablet (160 mg total) by mouth daily. 90 tablet 3   No current facility-administered medications on file prior to visit.     ROS see history of present illness  Objective  Physical Exam Vitals:   12/09/22 1104 12/09/22 1122  BP: (!) 142/78 128/80  Pulse: 66   Temp: 97.7 F (36.5 C)   SpO2: 95%     BP Readings from Last 3 Encounters:  12/09/22 128/80  12/07/22 (!) 130/52  11/17/22 (!) 117/57   Wt Readings from Last 3 Encounters:  12/09/22 163 lb 12.8 oz (74.3 kg)  12/07/22 162 lb 2 oz (73.5 kg)  11/04/22 162 lb 6.4 oz (73.7 kg)    Physical Exam Neurological:     Comments: Slow steady gait walking in a straight line, slums testing 19/30      Assessment/Plan: Please see individual problem list.  Primary hypertension  Chronic obstructive pulmonary disease, unspecified COPD type (HCC) -     Promethazine-DM; Take 5 mLs by mouth 4 (four) times daily as needed for cough.  Dispense: 118 mL; Refill: 0  Mixed hyperlipidemia  Prediabetes  Sleeping difficulty -     ALPRAZolam; TAKE ONE  TABLET BY MOUTH AT BEDTIME AS NEEDED FOR ANXIETY  Dispense: 30 tablet; Refill: 0  Memory difficulty Assessment & Plan: Chronic issue.  Likely progressing.  Patient likely has dementia given his slums score.  We will check a TSH.  Will get an MRI brain.  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  Patient with fear of falling.  Does have a slow gait.  Discussed possibly doing some physical therapy to help with this.  Referral placed.  Orders: -     Ambulatory referral to Physical Therapy  Alzheimer's disease, unspecified (CODE) (HCC) -     MR BRAIN WO CONTRAST; Future  Other orders -     Atenolol; Take 1 tablet (25 mg total) by mouth at bedtime.  Dispense: 90 tablet; Refill: 3 -      Gabapentin; Take 1 capsule (100 mg total) by mouth 3 (three) times daily.  Dispense: 270 capsule; Refill: 1 -     traZODone HCl; Take 1 tablet (150 mg total) by mouth at bedtime.  Dispense: 90 tablet; Refill: 1   Refill of cough medicine provided to use only when he is sick.  Patient understands this.  Return in about 3 months (around 03/11/2023).   Marikay Alar, MD [facility]

## 2022-12-09 NOTE — Assessment & Plan Note (Signed)
Chronic issue.  Patient with fear of falling.  Does have a slow gait.  Discussed possibly doing some physical therapy to help with this.  Referral placed.

## 2022-12-09 NOTE — Patient Instructions (Signed)
Nice to see you. You do appear to have dementia.  We are doing an evaluation to figure out if there is some other underlying cause for this.  Will contact you with those results when they return.  Somebody will contact you to schedule an MRI of your brain. Somebody will contact you to set up physical therapy to help with your walking.

## 2022-12-09 NOTE — Assessment & Plan Note (Signed)
Chronic issue.  Likely progressing.  Patient likely has dementia given his slums score.  We will check a TSH.  Will get an MRI brain.

## 2022-12-17 ENCOUNTER — Ambulatory Visit
Admission: RE | Admit: 2022-12-17 | Discharge: 2022-12-17 | Disposition: A | Discharge status: Home / Self Care | Payer: Medicare HMO | Source: Ambulatory Visit | Attending: Family Medicine | Admitting: Family Medicine

## 2022-12-17 DIAGNOSIS — G309 Alzheimer's disease, unspecified: Secondary | ICD-10-CM | POA: Insufficient documentation

## 2022-12-17 DIAGNOSIS — F0390 Unspecified dementia without behavioral disturbance: Secondary | ICD-10-CM | POA: Diagnosis not present

## 2022-12-17 DIAGNOSIS — J329 Chronic sinusitis, unspecified: Secondary | ICD-10-CM | POA: Diagnosis not present

## 2022-12-23 ENCOUNTER — Telehealth: Payer: Self-pay

## 2022-12-23 NOTE — Telephone Encounter (Signed)
Spoke to [place] to let her know that the MRI results are not back yet and when they come in we will call them.

## 2022-12-23 NOTE — Telephone Encounter (Signed)
Patient's wife, Meko Masterson, called to state patient would like to know the results of his MRI.

## 2023-01-02 ENCOUNTER — Other Ambulatory Visit: Payer: Self-pay | Admitting: Family Medicine

## 2023-01-02 DIAGNOSIS — G479 Sleep disorder, unspecified: Secondary | ICD-10-CM

## 2023-01-11 ENCOUNTER — Ambulatory Visit: Payer: Medicare HMO | Admitting: Physical Therapy

## 2023-01-13 ENCOUNTER — Encounter: Payer: Medicare HMO | Admitting: Physical Therapy

## 2023-01-19 ENCOUNTER — Encounter: Payer: Medicare HMO | Admitting: Physical Therapy

## 2023-01-21 ENCOUNTER — Encounter: Payer: Medicare HMO | Admitting: Physical Therapy

## 2023-01-26 ENCOUNTER — Encounter: Payer: Medicare HMO | Admitting: Physical Therapy

## 2023-01-28 ENCOUNTER — Encounter: Payer: Medicare HMO | Admitting: Physical Therapy

## 2023-01-29 ENCOUNTER — Other Ambulatory Visit: Payer: Self-pay | Admitting: Family Medicine

## 2023-01-29 DIAGNOSIS — G479 Sleep disorder, unspecified: Secondary | ICD-10-CM

## 2023-02-02 ENCOUNTER — Encounter: Payer: Medicare HMO | Admitting: Physical Therapy

## 2023-02-03 ENCOUNTER — Encounter: Payer: Medicare HMO | Admitting: Physical Therapy

## 2023-02-10 ENCOUNTER — Encounter: Payer: Medicare HMO | Admitting: Physical Therapy

## 2023-02-15 ENCOUNTER — Encounter: Payer: Medicare HMO | Admitting: Physical Therapy

## 2023-02-17 ENCOUNTER — Telehealth: Payer: Self-pay

## 2023-02-17 ENCOUNTER — Encounter: Payer: Medicare HMO | Admitting: Physical Therapy

## 2023-02-17 NOTE — Patient Outreach (Signed)
  Care Coordination   Initial Visit Note   02/17/2023 Name: Aerick Simek MRN: [id number] DOB: 01-08-1946  Ramel Rodela is a 77 y.o. year old male who sees Birdie Sons, Yehuda Mao, MD for primary care. I spoke with  [name] by phone today.  What matters to the patients health and wellness today?  Patient states he is independent in his care and managing his health conditions.  Denies need for nursing or care coordination services at this time.     Goals Addressed             This Visit's Progress    COMPLETED: Care coordination activities - no follow up needed.       Interventions Today    Flowsheet Row Most Recent Value  General Interventions   General Interventions Discussed/Reviewed General Interventions Discussed  [Care coordination services discussed. SDOH survey completed. AWV discussed and patient advised to contact provider office to schedule. Discussed vaccines. Advised to contact PCP office if care coordination services needed in the future.]              SDOH assessments and interventions completed:  Yes  SDOH Interventions Today    Flowsheet Row Most Recent Value  SDOH Interventions   Food Insecurity Interventions Intervention Not Indicated  Housing Interventions Intervention Not Indicated  Transportation Interventions Intervention Not Indicated        Care Coordination Interventions:  Yes, provided   Follow up plan: No further intervention required.   Encounter Outcome:  Patient Visit Completed   [facility] Care Coordination [phone] direct line

## 2023-02-22 ENCOUNTER — Encounter: Payer: Medicare HMO | Admitting: Physical Therapy

## 2023-02-24 ENCOUNTER — Encounter: Payer: Medicare HMO | Admitting: Physical Therapy

## 2023-03-01 ENCOUNTER — Encounter: Payer: Medicare HMO | Admitting: Physical Therapy

## 2023-03-03 ENCOUNTER — Encounter: Payer: Medicare HMO | Admitting: Physical Therapy

## 2023-03-04 DIAGNOSIS — F325 Major depressive disorder, single episode, in full remission: Secondary | ICD-10-CM | POA: Diagnosis not present

## 2023-03-04 DIAGNOSIS — M1990 Unspecified osteoarthritis, unspecified site: Secondary | ICD-10-CM | POA: Diagnosis not present

## 2023-03-04 DIAGNOSIS — I10 Essential (primary) hypertension: Secondary | ICD-10-CM | POA: Diagnosis not present

## 2023-03-04 DIAGNOSIS — I2510 Atherosclerotic heart disease of native coronary artery without angina pectoris: Secondary | ICD-10-CM | POA: Diagnosis not present

## 2023-03-04 DIAGNOSIS — I4891 Unspecified atrial fibrillation: Secondary | ICD-10-CM | POA: Diagnosis not present

## 2023-03-04 DIAGNOSIS — G629 Polyneuropathy, unspecified: Secondary | ICD-10-CM | POA: Diagnosis not present

## 2023-03-04 DIAGNOSIS — E785 Hyperlipidemia, unspecified: Secondary | ICD-10-CM | POA: Diagnosis not present

## 2023-03-04 DIAGNOSIS — J449 Chronic obstructive pulmonary disease, unspecified: Secondary | ICD-10-CM | POA: Diagnosis not present

## 2023-03-04 DIAGNOSIS — Z008 Encounter for other general examination: Secondary | ICD-10-CM | POA: Diagnosis not present

## 2023-03-04 DIAGNOSIS — G4700 Insomnia, unspecified: Secondary | ICD-10-CM | POA: Diagnosis not present

## 2023-03-04 DIAGNOSIS — I739 Peripheral vascular disease, unspecified: Secondary | ICD-10-CM | POA: Diagnosis not present

## 2023-03-04 DIAGNOSIS — D6869 Other thrombophilia: Secondary | ICD-10-CM | POA: Diagnosis not present

## 2023-03-04 DIAGNOSIS — F419 Anxiety disorder, unspecified: Secondary | ICD-10-CM | POA: Diagnosis not present

## 2023-03-08 ENCOUNTER — Telehealth: Payer: Self-pay | Admitting: Family Medicine

## 2023-03-08 ENCOUNTER — Encounter: Payer: Medicare HMO | Admitting: Physical Therapy

## 2023-03-08 DIAGNOSIS — R001 Bradycardia, unspecified: Secondary | ICD-10-CM | POA: Diagnosis not present

## 2023-03-08 DIAGNOSIS — Z79899 Other long term (current) drug therapy: Secondary | ICD-10-CM | POA: Diagnosis not present

## 2023-03-08 DIAGNOSIS — R519 Headache, unspecified: Secondary | ICD-10-CM | POA: Diagnosis not present

## 2023-03-08 DIAGNOSIS — G8929 Other chronic pain: Secondary | ICD-10-CM | POA: Diagnosis not present

## 2023-03-08 DIAGNOSIS — J811 Chronic pulmonary edema: Secondary | ICD-10-CM | POA: Diagnosis not present

## 2023-03-08 DIAGNOSIS — I10 Essential (primary) hypertension: Secondary | ICD-10-CM | POA: Diagnosis not present

## 2023-03-08 DIAGNOSIS — Z885 Allergy status to narcotic agent status: Secondary | ICD-10-CM | POA: Diagnosis not present

## 2023-03-08 DIAGNOSIS — N19 Unspecified kidney failure: Secondary | ICD-10-CM | POA: Diagnosis not present

## 2023-03-08 DIAGNOSIS — J449 Chronic obstructive pulmonary disease, unspecified: Secondary | ICD-10-CM | POA: Diagnosis not present

## 2023-03-08 DIAGNOSIS — Z7901 Long term (current) use of anticoagulants: Secondary | ICD-10-CM | POA: Diagnosis not present

## 2023-03-08 DIAGNOSIS — Z7902 Long term (current) use of antithrombotics/antiplatelets: Secondary | ICD-10-CM | POA: Diagnosis not present

## 2023-03-08 DIAGNOSIS — I4891 Unspecified atrial fibrillation: Secondary | ICD-10-CM | POA: Diagnosis not present

## 2023-03-08 DIAGNOSIS — F17210 Nicotine dependence, cigarettes, uncomplicated: Secondary | ICD-10-CM | POA: Diagnosis not present

## 2023-03-08 DIAGNOSIS — R29898 Other symptoms and signs involving the musculoskeletal system: Secondary | ICD-10-CM | POA: Diagnosis not present

## 2023-03-08 DIAGNOSIS — G9340 Encephalopathy, unspecified: Secondary | ICD-10-CM | POA: Diagnosis not present

## 2023-03-08 NOTE — Telephone Encounter (Signed)
Noted.  It sounds like the patient needs to be seen today.  Can you please find out what access nurse advised him to do?  Please find out what symptoms he is currently having.  Please find out what symptoms he had yesterday.

## 2023-03-08 NOTE — Telephone Encounter (Signed)
Called Patient and his wife answered and states she is on the way to her doctor appointment in [place]. Wife states they called 911 like advised by access nurse and the ambulance was in route so she called her daughter to be with her husband. I let her know I will call back later to check on the Patient.

## 2023-03-08 NOTE — Telephone Encounter (Signed)
Pt called stating his BP today was 152/ 89 at 730am and at 9am it was 162/100 with a heart rate of 55. Pt stated his heart rate went down yesterday to 40 and he was staggering while talking to someone at church. Sent to access nurse

## 2023-03-09 ENCOUNTER — Other Ambulatory Visit: Payer: Self-pay | Admitting: Family Medicine

## 2023-03-09 DIAGNOSIS — E785 Hyperlipidemia, unspecified: Secondary | ICD-10-CM

## 2023-03-10 ENCOUNTER — Encounter: Payer: Medicare HMO | Admitting: Physical Therapy

## 2023-03-11 DIAGNOSIS — M5416 Radiculopathy, lumbar region: Secondary | ICD-10-CM | POA: Diagnosis not present

## 2023-03-11 DIAGNOSIS — M5136 Other intervertebral disc degeneration, lumbar region with discogenic back pain only: Secondary | ICD-10-CM | POA: Diagnosis not present

## 2023-03-15 ENCOUNTER — Ambulatory Visit (INDEPENDENT_AMBULATORY_CARE_PROVIDER_SITE_OTHER): Payer: Medicare HMO | Admitting: Family Medicine

## 2023-03-15 ENCOUNTER — Encounter: Payer: Medicare HMO | Admitting: Physical Therapy

## 2023-03-15 ENCOUNTER — Encounter: Payer: Self-pay | Admitting: Family Medicine

## 2023-03-15 VITALS — BP 138/78 | HR 52 | Temp 97.6°F | Ht 68.0 in | Wt 160.4 lb

## 2023-03-15 DIAGNOSIS — I10 Essential (primary) hypertension: Secondary | ICD-10-CM

## 2023-03-15 DIAGNOSIS — M5416 Radiculopathy, lumbar region: Secondary | ICD-10-CM | POA: Diagnosis not present

## 2023-03-15 DIAGNOSIS — F419 Anxiety disorder, unspecified: Secondary | ICD-10-CM

## 2023-03-15 DIAGNOSIS — Z23 Encounter for immunization: Secondary | ICD-10-CM | POA: Diagnosis not present

## 2023-03-15 DIAGNOSIS — E782 Mixed hyperlipidemia: Secondary | ICD-10-CM

## 2023-03-15 LAB — BASIC METABOLIC PANEL
BUN: 17 mg/dL (ref 6–23)
CO2: 30 meq/L (ref 19–32)
Calcium: 9.3 mg/dL (ref 8.4–10.5)
Chloride: 103 meq/L (ref 96–112)
Creatinine, Ser: 1.02 mg/dL (ref 0.40–1.50)
GFR: 70.88 mL/min (ref >60.00)
Glucose, Bld: 104 mg/dL — ABNORMAL HIGH (ref 70–99)
Potassium: 4.2 meq/L (ref 3.5–5.1)
Sodium: 141 meq/L (ref 135–145)

## 2023-03-15 NOTE — Assessment & Plan Note (Signed)
Patient will see his specialist as planned.

## 2023-03-15 NOTE — Assessment & Plan Note (Signed)
Chronic issue.  Adequately controlled at this time.  Patient will continue atenolol 25 mg daily and valsartan 160 mg daily.  Check labs.

## 2023-03-15 NOTE — Patient Instructions (Signed)
Please let me know if you are blood pressure trends back up. Please contact us immediately if your blood pressure is greater than 180/110.

## 2023-03-15 NOTE — Progress Notes (Signed)
Karl Alar, MD Phone: [phone]  Kerby Borner is a 77 y.o. male who presents today for f/u.  HYPERTENSION Disease Monitoring Home BP Monitoring had a day or two with elevated BPs to 150s-160s/100, though since then BP has been <140/80.  Patient notes no specific known cause for the elevated blood pressures. Chest pain- no    Dyspnea- no Medications Compliance-  taking valsartan, atenolol edema- no BMET    Component Value Date/Time   NA 137 10/30/2022 0753   NA 137 05/06/2014 0751   K 4.7 10/30/2022 0753   K 4.1 05/06/2014 0751   CL 103 10/30/2022 0753   CL 103 05/06/2014 0751   CO2 27 10/30/2022 0753   CO2 28 05/06/2014 0751   GLUCOSE 112 (H) 10/30/2022 0753   GLUCOSE 121 (H) 05/06/2014 0751   BUN 15 10/30/2022 0753   BUN 10 05/06/2014 0751   CREATININE 1.15 10/30/2022 0753   CREATININE 1.00 06/17/2018 1439   CALCIUM 9.1 10/30/2022 0753   CALCIUM 8.6 05/06/2014 0751   GFRNONAA >60 10/30/2022 0753   GFRNONAA >60 05/06/2014 0751   GFRAA >60 07/27/2019 0259   GFRAA >60 05/06/2014 0751   HYPERLIPIDEMIA Symptoms Chest pain on exertion:  no   Medications: Compliance- taking lipitor Right upper quadrant pain- no  Muscle aches- no Lipid Panel     Component Value Date/Time   CHOL 131 09/11/2022 0859   TRIG 95.0 09/11/2022 0859   HDL 46.80 09/11/2022 0859   CHOLHDL 3 09/11/2022 0859   VLDL 19.0 09/11/2022 0859   LDLCALC 65 09/11/2022 0859   LDLCALC 75 06/17/2018 1439   LDLDIRECT 61.0 08/05/2020 0830   Chronic low back pain: Patient notes he is scheduled to have an injection in his back on 10/16.  He did been having some pain radiating to his right leg.  Anxiety: Patient reports anxiety is stable.  Taking Xanax at night.  No depression.  Social History   Tobacco Use  Smoking Status Every Day   Current packs/day: 0.25   Average packs/day: 0.3 packs/day for 65.0 years (16.3 ttl pk-yrs)   Types: Cigars, Cigarettes  Smokeless Tobacco Never  Tobacco  Comments   0.5PPD 12/07/2022    Current Outpatient Medications on File Prior to Visit  Medication Sig Dispense Refill   ALPRAZolam (XANAX) 0.5 MG tablet TAKE 1 TABLET BY MOUTH AT BEDTIME AS NEEDED FOR ANXIETY. 30 tablet 1   apixaban (ELIQUIS) 5 MG TABS tablet TAKE ONE TABLET BY MOUTH TWICE DAILY 180 tablet 0   atenolol (TENORMIN) 25 MG tablet Take 1 tablet (25 mg total) by mouth at bedtime. 90 tablet 3   atorvastatin (LIPITOR) 80 MG tablet TAKE 1 TABLET BY MOUTH DAILY 90 tablet 1   cyanocobalamin (VITAMIN B12) 1000 MCG tablet Take 1 tablet (1,000 mcg total) by mouth daily. 90 tablet 1   Fluticasone-Umeclidin-Vilant (TRELEGY ELLIPTA) 100-62.5-25 MCG/ACT AEPB Inhale 1 puff into the lungs daily. 28 each 11   gabapentin (NEURONTIN) 100 MG capsule Take 1 capsule (100 mg total) by mouth 3 (three) times daily. 270 capsule 1   ketoconazole (NIZORAL) 2 % cream Apply to feet and between toes every night. 60 g 2   mupirocin ointment (BACTROBAN) 2 % Apply to affected area after cleansing followed by bandage every 2-3 days. 22 g 0   nitroGLYCERIN (NITROSTAT) 0.4 MG SL tablet Place 1 tablet (0.4 mg total) under the tongue every 5 (five) minutes as needed for chest pain. For up to 3 doses per episode. 50  tablet 0   promethazine-dextromethorphan (PROMETHAZINE-DM) 6.25-15 MG/5ML syrup Take 5 mLs by mouth 4 (four) times daily as needed for cough. 118 mL 0   Spacer/Aero-Holding Chambers (AEROCHAMBER PLUS) inhaler Use with inhaler 1 each 2   traZODone (DESYREL) 150 MG tablet Take 1 tablet (150 mg total) by mouth at bedtime. 90 tablet 1   valsartan (DIOVAN) 160 MG tablet Take 1 tablet (160 mg total) by mouth daily. 90 tablet 3   No current facility-administered medications on file prior to visit.     ROS see history of present illness  Objective  Physical Exam Vitals:   03/15/23 0809 03/15/23 0821  BP: (!) 142/80 138/78  Pulse: (!) 52   Temp: 97.6 F (36.4 C)   SpO2: 94%     BP Readings from Last 3  Encounters:  03/15/23 138/78  12/09/22 128/80  12/07/22 (!) 130/52   Wt Readings from Last 3 Encounters:  03/15/23 160 lb 6.4 oz (72.8 kg)  12/09/22 163 lb 12.8 oz (74.3 kg)  12/07/22 162 lb 2 oz (73.5 kg)    Physical Exam Constitutional:      General: He is not in acute distress.    Appearance: He is not diaphoretic.  Cardiovascular:     Rate and Rhythm: Normal rate and regular rhythm.     Heart sounds: Normal heart sounds.  Pulmonary:     Effort: Pulmonary effort is normal.     Breath sounds: Normal breath sounds.  Skin:    General: Skin is warm and dry.  Neurological:     Mental Status: He is alert.      Assessment/Plan: Please see individual problem list.  Primary hypertension Assessment & Plan: Chronic issue.  Adequately controlled at this time.  Patient will continue atenolol 25 mg daily and valsartan 160 mg daily.  Check labs.  Orders: -     Basic metabolic panel  Mixed hyperlipidemia Assessment & Plan: Chronic issue.  Continue Lipitor 80 mg daily.   Anxiety Assessment & Plan: Chronic issue.  Stable.  Patient can continue Xanax 1 tablet by mouth at bedtime.   Lumbar radiculopathy Assessment & Plan: Patient will see his specialist as planned.   Encounter for administration of vaccine -     Flu Vaccine Trivalent High Dose (Fluad)     Health Maintenance: Patient will get tetanus vaccine at the pharmacy.  Flu vaccine given today.  Return in about 6 months (around 09/13/2023) for transfer of care visit.   Karl Alar, MD [facility]

## 2023-03-15 NOTE — Assessment & Plan Note (Signed)
Chronic issue.  Continue Lipitor 80 mg daily.

## 2023-03-15 NOTE — Assessment & Plan Note (Signed)
Chronic issue.  Stable.  Patient can continue Xanax 1 tablet by mouth at bedtime.

## 2023-03-17 ENCOUNTER — Encounter: Payer: Medicare HMO | Admitting: Physical Therapy

## 2023-03-22 ENCOUNTER — Encounter: Payer: Medicare HMO | Admitting: Physical Therapy

## 2023-03-24 DIAGNOSIS — M48062 Spinal stenosis, lumbar region with neurogenic claudication: Secondary | ICD-10-CM | POA: Diagnosis not present

## 2023-03-24 DIAGNOSIS — M5416 Radiculopathy, lumbar region: Secondary | ICD-10-CM | POA: Diagnosis not present

## 2023-03-26 ENCOUNTER — Ambulatory Visit
Admission: RE | Admit: 2023-03-26 | Discharge: 2023-03-26 | Disposition: A | Discharge status: Home / Self Care | Payer: Medicare HMO | Source: Ambulatory Visit | Attending: Family Medicine | Admitting: Family Medicine

## 2023-03-26 DIAGNOSIS — F17210 Nicotine dependence, cigarettes, uncomplicated: Secondary | ICD-10-CM | POA: Diagnosis not present

## 2023-03-26 DIAGNOSIS — Z122 Encounter for screening for malignant neoplasm of respiratory organs: Secondary | ICD-10-CM | POA: Diagnosis not present

## 2023-03-26 DIAGNOSIS — Z87891 Personal history of nicotine dependence: Secondary | ICD-10-CM | POA: Diagnosis not present

## 2023-03-30 ENCOUNTER — Ambulatory Visit (INDEPENDENT_AMBULATORY_CARE_PROVIDER_SITE_OTHER): Payer: Medicare HMO | Admitting: *Deleted

## 2023-03-30 ENCOUNTER — Telehealth: Payer: Self-pay | Admitting: *Deleted

## 2023-03-30 VITALS — BP 144/93 | HR 64 | Ht 68.0 in | Wt 162.0 lb

## 2023-03-30 DIAGNOSIS — Z0000 Encounter for general adult medical examination without abnormal findings: Secondary | ICD-10-CM | POA: Diagnosis not present

## 2023-03-30 DIAGNOSIS — I10 Essential (primary) hypertension: Secondary | ICD-10-CM

## 2023-03-30 NOTE — Patient Instructions (Signed)
Karl Myers , Thank you for taking time to come for your Medicare Wellness Visit. I appreciate your ongoing commitment to your health goals. Please review the following plan we discussed and let me know if I can assist you in the future.   Referrals/Orders/Follow-Ups/Clinician Recommendations: Remember to update your tetanus  This is a list of the screening recommended for you and due dates:  Health Maintenance  Topic Date Due   DTaP/Tdap/Td vaccine (1 - Tdap) Never done   Medicare Annual Wellness Visit  03/29/2024   Pneumonia Vaccine  Completed   Flu Shot  Completed   Hepatitis C Screening  Completed   Zoster (Shingles) Vaccine  Completed   HPV Vaccine  Aged Out   COVID-19 Vaccine  Discontinued    Advanced directives: (In Chart) A copy of your advanced directives are scanned into your chart should your provider ever need it.  Next Medicare Annual Wellness Visit scheduled for next year: Yes 04/03/24 @ 8:15

## 2023-03-30 NOTE — Telephone Encounter (Signed)
Noted.  I would suggest increasing his valsartan to 320 mg once daily.  He needs follow-up with me in the office in 10 days to recheck his blood pressure and complete lab work.  I typically send his Xanax in with 1 refill.  The most I could send in would be 2 refills.

## 2023-03-30 NOTE — Telephone Encounter (Signed)
Called patient to perform his AWV. Patient's blood pressure was 142/96 and later 144/93 and heart rate 64. Patient stated that he has a slight headache at times that comes and goes. Patient had no other symptoms. Patient stated that his blood pressure has been elevated recently. Patient stated that he has not missed any of his medications and takes them at night. Patient stated that he is only taking Eliquis once a day because of the cost.  Patient also stated that he has to call the office every month for a refill on his Alprazolam and request that more than refill be sent in at a time. Patient stated that he can only get 30 at a time but would like more refills so that he does not have to call the office every month. [facility]

## 2023-03-30 NOTE — Progress Notes (Signed)
Subjective:   Karl Myers is a 77 y.o. male who presents for Medicare Annual/Subsequent preventive examination.  Visit Complete: Virtual I connected with  Karl Myers on 03/30/23 by a audio enabled telemedicine application and verified that I am speaking with the correct person using two identifiers.  Patient Location: Home  Provider Location: Office/Clinic  I discussed the limitations of evaluation and management by telemedicine. The patient expressed understanding and agreed to proceed.  Vital Signs: Because this visit was a virtual/telehealth visit, some criteria may be missing or patient reported. Any vitals not documented were not able to be obtained and vitals that have been documented are patient reported.Patient reported some vital signs which has been documented.   Cardiac Risk Factors include: advanced age (>105men, >16 women);male gender;dyslipidemia;hypertension;smoking/ tobacco exposure;Other (see comment), Risk factor comments: CAD, History of AFib     Objective:    Today's Vitals   03/30/23 0818 03/30/23 0842  BP: (!) 142/96 (!) 144/93  Pulse: 63 64  Weight: 162 lb (73.5 kg)   Height: 5\' 8"  (1.727 m)    Body mass index is 24.63 kg/m.     03/30/2023    8:37 AM 11/04/2022   10:03 AM 05/07/2022    2:40 PM 03/25/2022    3:08 PM 03/24/2022   12:47 PM 05/22/2021    3:12 PM 03/07/2021   10:29 AM  Advanced Directives  Does Patient Have a Medical Advance Directive? Yes No Yes No Yes No Yes  Type of Estate agent of [place];Living will  Healthcare Power of [place];Living will  Healthcare Power of [place];Living will  Healthcare Power of [place];Living will  Does patient want to make changes to medical advance directive? No - Patient declined    No - Patient declined  No - Patient declined  Copy of Healthcare Power of Attorney in Chart? Yes - validated most recent copy scanned in chart (See row information)    Yes - validated most  recent copy scanned in chart (See row information)  Yes - validated most recent copy scanned in chart (See row information)  Would patient like information on creating a medical advance directive?  No - Patient declined  No - Patient declined  No - Patient declined     Current Medications (verified) Outpatient Encounter Medications as of 03/30/2023  Medication Sig   ALPRAZolam (XANAX) 0.5 MG tablet TAKE 1 TABLET BY MOUTH AT BEDTIME AS NEEDED FOR ANXIETY.   apixaban (ELIQUIS) 5 MG TABS tablet TAKE ONE TABLET BY MOUTH TWICE DAILY   atenolol (TENORMIN) 25 MG tablet Take 1 tablet (25 mg total) by mouth at bedtime.   atorvastatin (LIPITOR) 80 MG tablet TAKE 1 TABLET BY MOUTH DAILY   cyanocobalamin (VITAMIN B12) 1000 MCG tablet Take 1 tablet (1,000 mcg total) by mouth daily.   Fluticasone-Umeclidin-Vilant (TRELEGY ELLIPTA) 100-62.5-25 MCG/ACT AEPB Inhale 1 puff into the lungs daily.   gabapentin (NEURONTIN) 100 MG capsule Take 1 capsule (100 mg total) by mouth 3 (three) times daily.   nitroGLYCERIN (NITROSTAT) 0.4 MG SL tablet Place 1 tablet (0.4 mg total) under the tongue every 5 (five) minutes as needed for chest pain. For up to 3 doses per episode.   Spacer/Aero-Holding Chambers (AEROCHAMBER PLUS) inhaler Use with inhaler   traZODone (DESYREL) 150 MG tablet Take 1 tablet (150 mg total) by mouth at bedtime.   valsartan (DIOVAN) 160 MG tablet Take 1 tablet (160 mg total) by mouth daily.   ketoconazole (NIZORAL) 2 % cream Apply  to feet and between toes every night. (Patient not taking: Reported on 03/30/2023)   mupirocin ointment (BACTROBAN) 2 % Apply to affected area after cleansing followed by bandage every 2-3 days. (Patient not taking: Reported on 03/30/2023)   [DISCONTINUED] promethazine-dextromethorphan (PROMETHAZINE-DM) 6.25-15 MG/5ML syrup Take 5 mLs by mouth 4 (four) times daily as needed for cough. (Patient not taking: Reported on 03/30/2023)   No facility-administered encounter medications  on file as of 03/30/2023.    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 Breslow's   Pre-diabetes    Past Surgical History:  Procedure Laterality Date   BACK SURGERY     x 3   CARPAL TUNNEL RELEASE Right 08/19/2017   Procedure: CARPAL TUNNEL RELEASE;  Surgeon: Kennedy Bucker, MD;  Location: [facility] ORS;  Service: Orthopedics;  Laterality: Right;   CATARACT EXTRACTION W/PHACO Right 11/17/2016   Procedure: CATARACT EXTRACTION PHACO AND INTRAOCULAR LENS PLACEMENT (IOC);  Surgeon: Galen Manila, MD;  Location: [facility] ORS;  Service: Ophthalmology;  Laterality: Right;  [place] 00:37 AP% 12.6 CDE 4.69 Fluid pack lot # [id number]   CATARACT EXTRACTION W/PHACO Left 12/08/2016   Procedure: CATARACT EXTRACTION PHACO AND INTRAOCULAR LENS PLACEMENT (IOC);  Surgeon: Galen Manila, MD;  Location: [facility] ORS;  Service: Ophthalmology;  Laterality: Left;  [place] 00:33 AP% 13.9 CDE 4.63 Fluid pack lot # [id number]   TOE AMPUTATION Left    ULNAR TUNNEL RELEASE Right 08/19/2017   Procedure: CUBITAL TUNNEL RELEASE;  Surgeon: Kennedy Bucker, MD;  Location: [facility] ORS;  Service: Orthopedics;  Laterality: Right;   Family History  Problem Relation Age of Onset   Heart attack Mother    Prostate cancer Father    Breast cancer Sister 70   Dementia Sister    Cancer Brother        unknown what kind   Social History   Socioeconomic History   Marital status: Married    Spouse name: Not on file   Number of children: Not on file   Years of education: Not on file   Highest education level: Not on file  Occupational History   Not on file   Tobacco Use   Smoking status: Every Day    Current packs/day: 0.25    Average packs/day: 0.3 packs/day for 65.0 years (16.3 ttl pk-yrs)    Types: Cigars, Cigarettes   Smokeless tobacco: Never   Tobacco comments:    0.5PPD 12/07/2022  Vaping Use   Vaping status: Never Used  Substance and Sexual Activity   Alcohol use: No   Drug use: No   Sexual activity: Not on file  Other Topics Concern   Not on file  Social History Narrative   Married   Social Determinants of Health   Financial Resource Strain: Low Risk  (03/30/2023)   Overall Financial Resource Strain (CARDIA)    Difficulty of Paying Living Expenses: Not hard at all  Food Insecurity: No Food Insecurity (03/30/2023)   Hunger Vital Sign    Worried About Running Out of Food in the Last Year: Never true    Ran Out of Food in the  Last Year: Never true  Transportation Needs: No Transportation Needs (03/30/2023)   PRAPARE - Administrator, Civil Service (Medical): No    Lack of Transportation (Non-Medical): No  Physical Activity: Inactive (03/30/2023)   Exercise Vital Sign    Days of Exercise per Week: 0 days    Minutes of Exercise per Session: 0 min  Stress: No Stress Concern Present (03/30/2023)   [employer] of Occupational Health - Occupational Stress Questionnaire    Feeling of Stress : Only a little  Social Connections: Socially Integrated (03/30/2023)   Social Connection and Isolation Panel [NHANES]    Frequency of Communication with Friends and Family: More than three times a week    Frequency of Social Gatherings with Friends and Family: More than three times a week    Attends Religious Services: More than 4 times per year    Active Member of [employer] or Organizations: Yes    Attends Engineer, structural: More than 4 times per year    Marital Status: Married    Tobacco Counseling Ready to quit: No Counseling given: Not Answered Tobacco comments: 0.5PPD 12/07/2022   Clinical  Intake:  Pre-visit preparation completed: Yes  Pain : No/denies pain     BMI - recorded: 24.63 Nutritional Status: BMI of 19-24  Normal Nutritional Risks: None Diabetes: No  How often do you need to have someone help you when you read instructions, pamphlets, or other written materials from your doctor or pharmacy?: 1 - Never  Interpreter Needed?: No  Information entered by :: R. Sereena Marando LPN   Activities of Daily Living    03/30/2023    8:21 AM  In your present state of health, do you have any difficulty performing the following activities:  Hearing? 0  Vision? 0  Difficulty concentrating or making decisions? 1  Comment at times  Walking or climbing stairs? 0  Dressing or bathing? 0  Doing errands, shopping? 0  Preparing Food and eating ? N  Using the Toilet? N  In the past six months, have you accidently leaked urine? N  Do you have problems with loss of bowel control? N  Managing your Medications? N  Managing your Finances? N  Housekeeping or managing your Housekeeping? N    Patient Care Team: Glori Luis, MD as PCP - General (Family Medicine) Antonieta Iba, MD as PCP - Cardiology (Cardiology)  Indicate any recent Medical Services you may have received from other than [facility] providers in the past year (date may be approximate).     Assessment:   This is a routine wellness examination for [place].  Hearing/Vision screen Hearing Screening - Comments:: No issues Vision Screening - Comments:: No glasses   Goals Addressed             This Visit's Progress    Patient Stated       Wants to do more work around the house       Depression Screen    03/30/2023    8:33 AM 03/15/2023    8:10 AM 12/09/2022   11:06 AM 09/11/2022    8:35 AM 08/17/2022    2:49 PM 07/27/2022   11:05 AM 03/24/2022   12:43 PM  PHQ 2/9 Scores  PHQ - 2 Score 0 0 0 0 0 1 0  PHQ- 9 Score 0 0 0 0 0 3     Fall Risk    03/30/2023    8:24 AM 03/15/2023    8:10 AM  12/09/2022    11:06 AM 09/11/2022    8:35 AM 08/17/2022    2:49 PM  Fall Risk   Falls in the past year? 0 0 0 0 0  Number falls in past yr: 0 0 0 0 0  Injury with Fall? 0 0 0 0 0  Risk for fall due to : No Fall Risks No Fall Risks No Fall Risks No Fall Risks No Fall Risks  Follow up Falls prevention discussed;Falls evaluation completed Falls evaluation completed Falls evaluation completed Falls evaluation completed Falls evaluation completed    MEDICARE RISK AT HOME: Medicare Risk at Home Any stairs in or around the home?: Yes If so, are there any without handrails?: No Home free of loose throw rugs in walkways, pet beds, electrical cords, etc?: Yes Adequate lighting in your home to reduce risk of falls?: Yes Life alert?: No Use of a cane, walker or w/c?: No Grab bars in the bathroom?: No Shower chair or bench in shower?: Yes Elevated toilet seat or a handicapped toilet?: Yes     Cognitive Function:    02/02/2018   10:20 AM  MMSE - Mini Mental State Exam  Orientation to time 5  Orientation to Place 5  Registration 3  Attention/ Calculation 5  Recall 3  Language- name 2 objects 2  Language- repeat 1  Language- follow 3 step command 3  Language- read & follow direction 1  Write a sentence 1  Copy design 1  Total score 30        03/30/2023    8:37 AM 03/24/2022   12:48 PM 03/07/2021   10:37 AM 02/17/2019    9:53 AM  6CIT Screen  What Year? 0 points 0 points 0 points 0 points  What month? 0 points 0 points 0 points 0 points  What time? 0 points 0 points 0 points 0 points  Count back from 20 2 points 0 points 0 points 0 points  Months in reverse 2 points 0 points 0 points 0 points  Repeat phrase 2 points 2 points  0 points  Total Score 6 points 2 points  0 points    Immunizations Immunization History  Administered Date(s) Administered   Fluad Quad(high Dose 65+) 02/20/2019, 03/07/2020, 04/21/2021   Fluad Trivalent(High Dose 65+) 03/15/2023   Influenza, High Dose Seasonal PF  03/12/2017, 03/31/2018   Influenza-Unspecified 02/20/2019   PFIZER(Purple Top)SARS-COV-2 Vaccination 10/27/2019, 12/01/2019   PNEUMOCOCCAL CONJUGATE-20 09/11/2022   Zoster Recombinant(Shingrix) 12/03/2020, 03/07/2021    TDAP status: Due, Education has been provided regarding the importance of this vaccine. Advised may receive this vaccine at local pharmacy or Health Dept. Aware to provide a copy of the vaccination record if obtained from local pharmacy or Health Dept. Verbalized acceptance and understanding.  Flu Vaccine status: Up to date  Pneumococcal vaccine status: Up to date  Covid-19 vaccine status: Information provided on how to obtain vaccines.   Qualifies for Shingles Vaccine? Yes   Zostavax completed No   Shingrix Completed?: Yes  Screening Tests Health Maintenance  Topic Date Due   DTaP/Tdap/Td (1 - Tdap) Never done   Medicare Annual Wellness (AWV)  03/29/2024   Pneumonia Vaccine 14+ Years old  Completed   INFLUENZA VACCINE  Completed   Hepatitis C Screening  Completed   Zoster Vaccines- Shingrix  Completed   HPV VACCINES  Aged Out   COVID-19 Vaccine  Discontinued    Health Maintenance  Health Maintenance Due  Topic Date Due   DTaP/Tdap/Td (1 -  Tdap) Never done    Colorectal cancer screening: No longer required.   Lung Cancer Screening: (Low Dose CT Chest recommended if Age 63-80 years, 20 pack-year currently smoking OR have quit w/in 15years.) does qualify. Goes yearly and has this done    Additional Screening:  Hepatitis C Screening: does qualify; Completed 01/2018  Vision Screening: Recommended annual ophthalmology exams for early detection of glaucoma and other disorders of the eye. Is the patient up to date with their annual eye exam?  Yes  Who is the provider or what is the name of the office in which the patient attends annual eye exams? [facility] If pt is not established with a provider, would they like to be referred to a provider to establish  care? No .   Dental Screening: Recommended annual dental exams for proper oral hygiene    Community Resource Referral / Chronic Care Management: CRR required this visit?  No   CCM required this visit?  No     Plan:     I have personally reviewed and noted the following in the patient's chart:   Medical and social history Use of alcohol, tobacco or illicit drugs  Current medications and supplements including opioid prescriptions. Patient is not currently taking opioid prescriptions. Functional ability and status Nutritional status Physical activity Advanced directives List of other physicians Hospitalizations, surgeries, and ER visits in previous 12 months Vitals Screenings to include cognitive, depression, and falls Referrals and appointments  In addition, I have reviewed and discussed with patient certain preventive protocols, quality metrics, and best practice recommendations. A written personalized care plan for preventive services as well as general preventive health recommendations were provided to patient.     Sydell Axon, LPN   [id number]   After Visit Summary: (Declined) Due to this being a telephonic visit, with patients personalized plan was offered to patient but patient Declined AVS at this time   Nurse Notes: Phone note sent to PCP

## 2023-03-31 MED ORDER — VALSARTAN 320 MG PO TABS
320.0000 mg | ORAL_TABLET | Freq: Every day | ORAL | 1 refills | Status: DC
Start: 2023-03-31 — End: 2023-09-10

## 2023-03-31 NOTE — Telephone Encounter (Signed)
Noted  

## 2023-03-31 NOTE — Telephone Encounter (Signed)
Valsartan 320 mg sent to pharmacy.  Plan to check labs at visit on 04/09/2023.

## 2023-03-31 NOTE — Addendum Note (Signed)
Addended by: Birdie Sons, Siler Mavis G on: 03/31/2023 10:08 AM   Modules accepted: Orders

## 2023-04-01 ENCOUNTER — Other Ambulatory Visit: Payer: Self-pay | Admitting: Family Medicine

## 2023-04-01 DIAGNOSIS — G479 Sleep disorder, unspecified: Secondary | ICD-10-CM

## 2023-04-09 ENCOUNTER — Ambulatory Visit (INDEPENDENT_AMBULATORY_CARE_PROVIDER_SITE_OTHER): Payer: Medicare HMO | Admitting: Family Medicine

## 2023-04-09 ENCOUNTER — Encounter: Payer: Self-pay | Admitting: Family Medicine

## 2023-04-09 VITALS — BP 122/72 | HR 62 | Temp 97.7°F | Ht 68.0 in | Wt 162.2 lb

## 2023-04-09 DIAGNOSIS — I10 Essential (primary) hypertension: Secondary | ICD-10-CM | POA: Diagnosis not present

## 2023-04-09 DIAGNOSIS — I739 Peripheral vascular disease, unspecified: Secondary | ICD-10-CM | POA: Diagnosis not present

## 2023-04-09 DIAGNOSIS — R5382 Chronic fatigue, unspecified: Secondary | ICD-10-CM

## 2023-04-09 LAB — VITAMIN D 25 HYDROXY (VIT D DEFICIENCY, FRACTURES): VITD: 50.08 ng/mL (ref 30.00–100.00)

## 2023-04-09 LAB — BASIC METABOLIC PANEL
BUN: 18 mg/dL (ref 6–23)
CO2: 30 meq/L (ref 19–32)
Calcium: 8.9 mg/dL (ref 8.4–10.5)
Chloride: 103 meq/L (ref 96–112)
Creatinine, Ser: 1.1 mg/dL (ref 0.40–1.50)
GFR: 64.71 mL/min (ref >60.00)
Glucose, Bld: 102 mg/dL — ABNORMAL HIGH (ref 70–99)
Potassium: 4.3 meq/L (ref 3.5–5.1)
Sodium: 138 meq/L (ref 135–145)

## 2023-04-09 LAB — VITAMIN B12: Vitamin B-12: 637 pg/mL (ref 211–911)

## 2023-04-09 LAB — TSH: TSH: 0.55 u[IU]/mL (ref 0.35–5.50)

## 2023-04-09 NOTE — Assessment & Plan Note (Signed)
Discussed that this could be related to aging or could be related to his atenolol.  Will check lab work as outlined to look for underlying causes.  If lab work is negative we may need to consider backing off on his atenolol dose.

## 2023-04-09 NOTE — Assessment & Plan Note (Signed)
Chronic issue.  Adequately controlled.  Patient will continue valsartan 320 mg daily and atenolol 25 mg daily.

## 2023-04-09 NOTE — Assessment & Plan Note (Signed)
Chronic issue recently diagnosed.  Refer to vascular surgery for further management and evaluation.

## 2023-04-09 NOTE — Progress Notes (Signed)
Marikay Alar, MD Phone: [phone]  Karl Myers is a 77 y.o. male who presents today for follow-up.  Hypertension: Not checking blood pressures.  He is taking valsartan and atenolol.  No chest pain.  No change to chronic dyspnea.  No edema.  Fatigue: Patient notes a lack of energy.  He notes he can do stuff for about 3 hours and then he feels ready for a nap.  He feels like he sleeps well at night.  He gets 6 to 7 hours of sleep nightly.  He wakes up well rested.  Occasionally he will take a nap during the day.  PAD: Patient was found to have PAD on home screening with his insurance company.  ABI score of 0.77 on the left side and 0.49 on the right side.  Patient notes no claudication.  Social History   Tobacco Use  Smoking Status Every Day   Current packs/day: 0.25   Average packs/day: 0.3 packs/day for 65.0 years (16.3 ttl pk-yrs)   Types: Cigars, Cigarettes  Smokeless Tobacco Never  Tobacco Comments   0.5PPD 12/07/2022    Current Outpatient Medications on File Prior to Visit  Medication Sig Dispense Refill   ALPRAZolam (XANAX) 0.5 MG tablet TAKE 1 TABLET BY MOUTH AT BEDTIME AS NEEDED FOR ANXIETY. 30 tablet 1   apixaban (ELIQUIS) 5 MG TABS tablet TAKE ONE TABLET BY MOUTH TWICE DAILY 180 tablet 0   atenolol (TENORMIN) 25 MG tablet Take 1 tablet (25 mg total) by mouth at bedtime. 90 tablet 3   atorvastatin (LIPITOR) 80 MG tablet TAKE 1 TABLET BY MOUTH DAILY 90 tablet 1   cyanocobalamin (VITAMIN B12) 1000 MCG tablet Take 1 tablet (1,000 mcg total) by mouth daily. 90 tablet 1   Fluticasone-Umeclidin-Vilant (TRELEGY ELLIPTA) 100-62.5-25 MCG/ACT AEPB Inhale 1 puff into the lungs daily. 28 each 11   gabapentin (NEURONTIN) 100 MG capsule Take 1 capsule (100 mg total) by mouth 3 (three) times daily. 270 capsule 1   nitroGLYCERIN (NITROSTAT) 0.4 MG SL tablet Place 1 tablet (0.4 mg total) under the tongue every 5 (five) minutes as needed for chest pain. For up to 3 doses per  episode. 50 tablet 0   traZODone (DESYREL) 150 MG tablet Take 1 tablet (150 mg total) by mouth at bedtime. 90 tablet 1   valsartan (DIOVAN) 320 MG tablet Take 1 tablet (320 mg total) by mouth daily. 90 tablet 1   No current facility-administered medications on file prior to visit.     ROS see history of present illness  Objective  Physical Exam Vitals:   04/09/23 1023  BP: 122/72  Pulse: 62  Temp: 97.7 F (36.5 C)  SpO2: 92%    BP Readings from Last 3 Encounters:  04/09/23 122/72  03/30/23 (!) 144/93  03/15/23 138/78   Wt Readings from Last 3 Encounters:  04/09/23 162 lb 3.2 oz (73.6 kg)  03/30/23 162 lb (73.5 kg)  03/15/23 160 lb 6.4 oz (72.8 kg)    Physical Exam Constitutional:      General: He is not in acute distress.    Appearance: He is not diaphoretic.  Cardiovascular:     Rate and Rhythm: Normal rate and regular rhythm.     Heart sounds: Normal heart sounds.  Pulmonary:     Effort: Pulmonary effort is normal.     Breath sounds: Normal breath sounds.  Skin:    General: Skin is warm and dry.  Neurological:     Mental Status: He is alert.  Assessment/Plan: Please see individual problem list.  Primary hypertension Assessment & Plan: Chronic issue.  Adequately controlled.  Patient will continue valsartan 320 mg daily and atenolol 25 mg daily.  Orders: -     Basic metabolic panel  Chronic fatigue Assessment & Plan: Discussed that this could be related to aging or could be related to his atenolol.  Will check lab work as outlined to look for underlying causes.  If lab work is negative we may need to consider backing off on his atenolol dose.  Orders: -     Vitamin B12 -     VITAMIN D 25 Hydroxy (Vit-D Deficiency, Fractures) -     TSH  PAD (peripheral artery disease) (HCC) Assessment & Plan: Chronic issue recently diagnosed.  Refer to vascular surgery for further management and evaluation.  Orders: -     Ambulatory referral to Vascular  Surgery     Return for As scheduled.   Marikay Alar, MD [facility]

## 2023-04-14 DIAGNOSIS — M5416 Radiculopathy, lumbar region: Secondary | ICD-10-CM | POA: Diagnosis not present

## 2023-04-14 DIAGNOSIS — M48062 Spinal stenosis, lumbar region with neurogenic claudication: Secondary | ICD-10-CM | POA: Diagnosis not present

## 2023-04-21 ENCOUNTER — Other Ambulatory Visit: Payer: Self-pay | Admitting: Family Medicine

## 2023-04-21 DIAGNOSIS — I10 Essential (primary) hypertension: Secondary | ICD-10-CM

## 2023-04-21 NOTE — Telephone Encounter (Signed)
Error

## 2023-04-28 DIAGNOSIS — M5416 Radiculopathy, lumbar region: Secondary | ICD-10-CM | POA: Diagnosis not present

## 2023-04-28 DIAGNOSIS — M48062 Spinal stenosis, lumbar region with neurogenic claudication: Secondary | ICD-10-CM | POA: Diagnosis not present

## 2023-04-29 ENCOUNTER — Other Ambulatory Visit: Payer: Self-pay | Admitting: Family

## 2023-04-29 DIAGNOSIS — G479 Sleep disorder, unspecified: Secondary | ICD-10-CM

## 2023-04-30 ENCOUNTER — Ambulatory Visit (INDEPENDENT_AMBULATORY_CARE_PROVIDER_SITE_OTHER): Payer: Medicare HMO | Admitting: Family Medicine

## 2023-04-30 ENCOUNTER — Encounter: Payer: Self-pay | Admitting: Family Medicine

## 2023-04-30 VITALS — BP 138/60 | HR 65 | Temp 97.6°F | Resp 16 | Ht 68.0 in | Wt 163.0 lb

## 2023-04-30 DIAGNOSIS — H6122 Impacted cerumen, left ear: Secondary | ICD-10-CM

## 2023-04-30 DIAGNOSIS — L570 Actinic keratosis: Secondary | ICD-10-CM | POA: Diagnosis not present

## 2023-04-30 DIAGNOSIS — H6690 Otitis media, unspecified, unspecified ear: Secondary | ICD-10-CM | POA: Diagnosis not present

## 2023-04-30 MED ORDER — AMOXICILLIN-POT CLAVULANATE 875-125 MG PO TABS: 1.0000 | ORAL_TABLET | Freq: Two times a day (BID) | ORAL | 0 refills | Status: AC

## 2023-04-30 MED ORDER — SACCHAROMYCES BOULARDII 250 MG PO CAPS: 250.0000 mg | ORAL_CAPSULE | Freq: Every day | ORAL | 0 refills | Status: DC

## 2023-04-30 MED ORDER — DEBROX 6.5 % OT SOLN: 5.0000 [drp] | Freq: Two times a day (BID) | OTIC | 0 refills | Status: AC

## 2023-04-30 NOTE — Progress Notes (Signed)
SUBJECTIVE:   Chief Complaint  Patient presents with   Ear Pain    Right ear pain since the middle of the night    HPI Presents for acute visit  Discussed the use of AI scribe software for clinical note transcription with the patient, who gave verbal consent to proceed.  History of Present Illness The patient, with a history of smoking for 60 years, presented with a severe earache that started overnight and was severe enough to wake her up. She reported feeling chills but did not measure a fever. She attempted to alleviate the pain by covering the ear with a toboggan and applying over-the-counter ear drops. The patient reported a decrease in hearing in the affected ear. This is the first time she has experienced an earache.  The patient also reported a recent episode of diarrhea, which has since resolved. She denied any recent flu-like symptoms, difficulty swallowing, or headaches, but did report a runny nose.  In addition to the earache, the patient has noticed two lumps on the side of her ear, one of which has been present for over a year, and the other appeared in the last two to three weeks. He has not sought medical attention for these lumps.  PERTINENT PMH / PSH: As above  OBJECTIVE:  BP 138/60   Pulse 65   Temp 97.6 F (36.4 C)   Resp 16   Ht 5\' 8"  (1.727 m)   Wt 163 lb (73.9 kg)   SpO2 98%   BMI 24.78 kg/m    Physical Exam Vitals reviewed.  Constitutional:      Appearance: He is normal weight. He is not ill-appearing or toxic-appearing.  HENT:     Head: Normocephalic.     Right Ear: External ear normal. Decreased hearing noted. Swelling and tenderness present. No drainage. Tympanic membrane is bulging.     Left Ear: Tympanic membrane, ear canal and external ear normal. There is impacted cerumen.     Ears:      Comments: Lesion appears to be consistent with AK    Nose: Nose normal.     Mouth/Throat:     Mouth: Mucous membranes are moist.  Eyes:      Conjunctiva/sclera: Conjunctivae normal.  Neck:     Vascular: No carotid bruit.  Cardiovascular:     Rate and Rhythm: Normal rate and regular rhythm.     Pulses: Normal pulses.     Heart sounds: Normal heart sounds.  Pulmonary:     Effort: Pulmonary effort is normal.     Breath sounds: Normal breath sounds. No wheezing or rhonchi.  Musculoskeletal:        General: Normal range of motion.     Cervical back: Normal range of motion and neck supple.     Right lower leg: No edema.     Left lower leg: No edema.  Lymphadenopathy:     Cervical: No cervical adenopathy.  Skin:    General: Skin is warm.  Neurological:     General: No focal deficit present.     Mental Status: He is alert and oriented to person, place, and time. Mental status is at baseline.  Psychiatric:        Mood and Affect: Mood normal.        Behavior: Behavior normal.        Thought Content: Thought content normal.        Judgment: Judgment normal.        04/30/2023  9:50 AM 04/09/2023   10:34 AM 03/30/2023    8:33 AM 03/15/2023    8:10 AM 12/09/2022   11:06 AM  Depression screen PHQ 2/9  Decreased Interest 0 0 0 0 0  Down, Depressed, Hopeless 0 0 0 0 0  PHQ - 2 Score 0 0 0 0 0  Altered sleeping 0 0 0 0 0  Tired, decreased energy 2 0 0 0 0  Change in appetite 0 0 0 0 0  Feeling bad or failure about yourself  0 0 0 0 0  Trouble concentrating 0 0 0 0 0  Moving slowly or fidgety/restless 3 0 0 0 0  Suicidal thoughts 0 0 0 0 0  PHQ-9 Score 5 0 0 0 0  Difficult doing work/chores Not difficult at all Not difficult at all Not difficult at all Not difficult at all Not difficult at all      04/30/2023    9:51 AM 04/09/2023   10:34 AM 03/15/2023    8:10 AM 12/09/2022   11:06 AM  GAD 7 : Generalized Anxiety Score  Nervous, Anxious, on Edge 2 0 0 0  Control/stop worrying 0 0 0 0  Worry too much - different things 0 0 0 0  Trouble relaxing 0 0 0 0  Restless 0 0 0 0  Easily annoyed or irritable 0 0 0 0  Afraid -  awful might happen 0 0 0 0  Total GAD 7 Score 2 0 0 0  Anxiety Difficulty Not difficult at all Not difficult at all Not difficult at all Not difficult at all    ASSESSMENT/PLAN:  Acute otitis media, unspecified otitis media type Assessment & Plan: Acute onset of severe ear pain with decreased hearing and chills. No fever, drainage, or trauma. Over-the-counter ear drops used with no relief. Examination revealed tenderness at tragus, edema of ear canal, bulging TM  -Prescribe Augmentin BID -Start Probiotics daily -Advise to report if symptoms worsen or if there is any discharge or fever.  Orders: -     Amoxicillin-Pot Clavulanate; Take 1 tablet by mouth 2 (two) times daily for 5 days.  Dispense: 10 tablet; Refill: 0 -     Saccharomyces boulardii; Take 1 capsule (250 mg total) by mouth daily.  Dispense: 90 capsule; Refill: 0  Impacted cerumen of left ear Assessment & Plan: Recommend OTC Debrox If no improvement can schedule RN appointment for irrigation  Orders: -     Debrox; Place 5 drops into the left ear 2 (two) times daily. 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 No pain or recent trauma. Patient has a dermatology appointment in December. History of AK -Advise to discuss lesions with dermatologist at upcoming appointment.    PDMP reviewed  Return if symptoms worsen or fail to improve, for PCP.  Dana Allan, MD

## 2023-04-30 NOTE — Patient Instructions (Addendum)
It was a pleasure meeting you today. Thank you for allowing me to take part in your health care.  Our goals for today as we discussed include:  Take Augmentin 1 tablet 2 times a day for 5 days Take Probiotics daily while on antibiotics and continue for at least 14 days after completion of Augmentin.  Debrox 5 drops to left ear two times a day for 5-7 days.  If no improvement can schedule nurse appointment for ear flushing.  If worsening symptoms follow up with PCP  This is a list of the screening recommended for you and due dates:  Health Maintenance  Topic Date Due   DTaP/Tdap/Td vaccine (1 - Tdap) Never done   Medicare Annual Wellness Visit  03/29/2024   Pneumonia Vaccine  Completed   Flu Shot  Completed   Hepatitis C Screening  Completed   Zoster (Shingles) Vaccine  Completed   HPV Vaccine  Aged Out   COVID-19 Vaccine  Discontinued      If you have any questions or concerns, please do not hesitate to call the office at [phone].  I look forward to our next visit and until then take care and stay safe.  Regards,   Dana Allan, MD   [facility]

## 2023-05-08 ENCOUNTER — Encounter: Payer: Self-pay | Admitting: Family Medicine

## 2023-05-08 DIAGNOSIS — H6690 Otitis media, unspecified, unspecified ear: Secondary | ICD-10-CM | POA: Insufficient documentation

## 2023-05-08 DIAGNOSIS — L570 Actinic keratosis: Secondary | ICD-10-CM | POA: Insufficient documentation

## 2023-05-08 DIAGNOSIS — H6122 Impacted cerumen, left ear: Secondary | ICD-10-CM | POA: Insufficient documentation

## 2023-05-08 HISTORY — DX: Otitis media, unspecified, unspecified ear: H66.90

## 2023-05-08 NOTE — Assessment & Plan Note (Addendum)
Acute onset of severe ear pain with decreased hearing and chills. No fever, drainage, or trauma. Over-the-counter ear drops used with no relief. Examination revealed tenderness at tragus, edema of ear canal, bulging TM  -Prescribe Augmentin BID -Start Probiotics daily -Advise to report if symptoms worsen or if there is any discharge or fever.

## 2023-05-08 NOTE — Assessment & Plan Note (Signed)
Chronic skin lesion on the ear and a new lesion noted in the past 2-3 weeks. No pain or recent trauma. Patient has a dermatology appointment in December. History of AK -Advise to discuss lesions with dermatologist at upcoming appointment.

## 2023-05-08 NOTE — Assessment & Plan Note (Signed)
Recommend OTC Debrox If no improvement can schedule RN appointment for irrigation

## 2023-05-10 ENCOUNTER — Ambulatory Visit: Payer: Medicare HMO | Admitting: Dermatology

## 2023-05-10 ENCOUNTER — Encounter: Payer: Self-pay | Admitting: Dermatology

## 2023-05-10 DIAGNOSIS — Z1283 Encounter for screening for malignant neoplasm of skin: Secondary | ICD-10-CM | POA: Diagnosis not present

## 2023-05-10 DIAGNOSIS — D225 Melanocytic nevi of trunk: Secondary | ICD-10-CM

## 2023-05-10 DIAGNOSIS — L821 Other seborrheic keratosis: Secondary | ICD-10-CM | POA: Diagnosis not present

## 2023-05-10 DIAGNOSIS — L853 Xerosis cutis: Secondary | ICD-10-CM | POA: Diagnosis not present

## 2023-05-10 DIAGNOSIS — L578 Other skin changes due to chronic exposure to nonionizing radiation: Secondary | ICD-10-CM

## 2023-05-10 DIAGNOSIS — W908XXA Exposure to other nonionizing radiation, initial encounter: Secondary | ICD-10-CM

## 2023-05-10 DIAGNOSIS — Z85820 Personal history of malignant melanoma of skin: Secondary | ICD-10-CM

## 2023-05-10 DIAGNOSIS — L570 Actinic keratosis: Secondary | ICD-10-CM | POA: Diagnosis not present

## 2023-05-10 DIAGNOSIS — D1801 Hemangioma of skin and subcutaneous tissue: Secondary | ICD-10-CM

## 2023-05-10 DIAGNOSIS — D2262 Melanocytic nevi of left upper limb, including shoulder: Secondary | ICD-10-CM

## 2023-05-10 DIAGNOSIS — D229 Melanocytic nevi, unspecified: Secondary | ICD-10-CM

## 2023-05-10 DIAGNOSIS — Z872 Personal history of diseases of the skin and subcutaneous tissue: Secondary | ICD-10-CM | POA: Diagnosis not present

## 2023-05-10 DIAGNOSIS — L814 Other melanin hyperpigmentation: Secondary | ICD-10-CM | POA: Diagnosis not present

## 2023-05-10 NOTE — Patient Instructions (Addendum)

## 2023-05-10 NOTE — Progress Notes (Signed)
Follow-Up Visit   Subjective  Karl Myers is a 77 y.o. male who presents for the following: Skin Cancer Screening and Full Body Skin Exam hx of Melanoma, Aks. Check spot on nose, not sure how long he has had  The patient presents for Total-Body Skin Exam (TBSE) for skin cancer screening and mole check. The patient has spots, moles and lesions to be evaluated, some may be new or changing and the patient may have concern these could be cancer.    The following portions of the chart were reviewed this encounter and updated as appropriate: medications, allergies, medical history  Review of Systems:  No other skin or systemic complaints except as noted in HPI or Assessment and Plan.  Objective  Well appearing patient in no apparent distress; mood and affect are within normal limits.  A full examination was performed including scalp, head, eyes, ears, nose, lips, neck, chest, axillae, abdomen, back, buttocks, bilateral upper extremities, bilateral lower extremities, hands, feet, fingers, toes, fingernails, and toenails. All findings within normal limits unless otherwise noted below.   Relevant physical exam findings are noted in the Assessment and Plan.  R paranasal x 1, L temple x 1, R alar rim x 2, L nasal dorsum x 1, R vertex scalp x 2 (7) Pink scaly macules    Assessment & Plan   SKIN CANCER SCREENING PERFORMED TODAY.  ACTINIC DAMAGE - Chronic condition, secondary to cumulative UV/sun exposure - diffuse scaly erythematous macules with underlying dyspigmentation - Recommend daily broad spectrum sunscreen SPF 30+ to sun-exposed areas, reapply every 2 hours as needed.  - Staying in the shade or wearing long sleeves, sun glasses (UVA+UVB protection) and wide brim hats (4-inch brim around the entire circumference of the hat) are also recommended for sun protection.  - Call for new or changing lesions.  LENTIGINES, SEBORRHEIC KERATOSES, HEMANGIOMAS - Benign normal skin lesions -  Benign-appearing - Call for any changes  LENTIGINES Crown scalp Exam: 0.6cm speckled tan macule  Treatment Plan: Benign-appearing, observe. Recommend daily broad spectrum sunscreen SPF 30+ to sun-exposed areas, reapply every 2 hours as needed.  Call for any changes  SEBORRHEIC KERATOSIS R pretibia Exam: 0.6cm speckled waxy macule R pretibia  Treatment: Benign-appearing.  Observation.  Call clinic for new or changing moles.  Recommend daily use of broad spectrum spf 30+ sunscreen to sun-exposed areas.     MELANOCYTIC NEVI - Tan-brown and/or pink-flesh-colored symmetric macules and papules, including L upper arm, L flank - Benign appearing on exam today - Observation - Call clinic for new or changing moles - Recommend daily use of broad spectrum spf 30+ sunscreen to sun-exposed areas.   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  - Call if any new or changing lesions are noted between office visits   Xerosis - diffuse xerotic patches - recommend gentle, hydrating skin care - gentle skin care handout given, moisturizer samples given  AK (actinic keratosis) (7) R paranasal x 1, L temple x 1, R alar rim x 2, L nasal dorsum x 1, R vertex scalp x 2  Actinic keratoses are precancerous spots that appear secondary to cumulative UV radiation exposure/sun exposure over time. They are chronic with expected duration over 1 year. A portion of actinic keratoses will progress to squamous  cell carcinoma of the skin. It is not possible to reliably predict which spots will progress to skin cancer and so treatment is recommended to prevent development of skin cancer.  Recommend daily broad spectrum sunscreen SPF 30+ to sun-exposed areas, reapply every 2 hours as needed.  Recommend  staying in the shade or wearing long sleeves, sun glasses (UVA+UVB protection) and wide brim hats (4-inch brim around the entire circumference of the hat). Call for new or changing lesions.  Destruction of lesion - R paranasal x 1, L temple x 1, R alar rim x 2, L nasal dorsum x 1, R vertex scalp x 2 (7)  Destruction method: cryotherapy   Informed consent: discussed and consent obtained   Lesion destroyed using liquid nitrogen: Yes   Region frozen until ice ball extended beyond lesion: Yes   Outcome: patient tolerated procedure well with no complications   Post-procedure details: wound care instructions given   Additional details:  Prior to procedure, discussed risks of blister formation, small wound, skin dyspigmentation, or rare scar following cryotherapy. Recommend Vaseline ointment to treated areas while healing.    Return in about 6 months (around 11/08/2023) for TBSE, Hx of Melanoma, Hx of AKs.  I, Ardis Rowan, RMA, am acting as scribe for Willeen Niece, MD .   Documentation: I have reviewed the above documentation for accuracy and completeness, and I agree with the above.  Willeen Niece, MD

## 2023-05-11 ENCOUNTER — Other Ambulatory Visit: Payer: Self-pay | Admitting: Family Medicine

## 2023-05-12 ENCOUNTER — Inpatient Hospital Stay (HOSPITAL_BASED_OUTPATIENT_CLINIC_OR_DEPARTMENT_OTHER): Payer: Medicare HMO | Admitting: Oncology

## 2023-05-12 ENCOUNTER — Inpatient Hospital Stay: Payer: Medicare HMO | Attending: Oncology

## 2023-05-12 ENCOUNTER — Encounter: Payer: Self-pay | Admitting: Oncology

## 2023-05-12 VITALS — BP 138/67 | HR 61 | Temp 97.1°F | Resp 18 | Wt 161.0 lb

## 2023-05-12 DIAGNOSIS — Z85820 Personal history of malignant melanoma of skin: Secondary | ICD-10-CM | POA: Diagnosis not present

## 2023-05-12 DIAGNOSIS — Z803 Family history of malignant neoplasm of breast: Secondary | ICD-10-CM | POA: Diagnosis not present

## 2023-05-12 DIAGNOSIS — D7589 Other specified diseases of blood and blood-forming organs: Secondary | ICD-10-CM | POA: Diagnosis not present

## 2023-05-12 DIAGNOSIS — E538 Deficiency of other specified B group vitamins: Secondary | ICD-10-CM | POA: Diagnosis not present

## 2023-05-12 DIAGNOSIS — F17290 Nicotine dependence, other tobacco product, uncomplicated: Secondary | ICD-10-CM | POA: Diagnosis not present

## 2023-05-12 DIAGNOSIS — Z8042 Family history of malignant neoplasm of prostate: Secondary | ICD-10-CM | POA: Diagnosis not present

## 2023-05-12 DIAGNOSIS — F17210 Nicotine dependence, cigarettes, uncomplicated: Secondary | ICD-10-CM | POA: Diagnosis not present

## 2023-05-12 LAB — CBC WITH DIFFERENTIAL (CANCER CENTER ONLY)
Abs Immature Granulocytes: 0.05 10*3/uL (ref 0.00–0.07)
Basophils Absolute: 0.1 10*3/uL (ref 0.0–0.1)
Basophils Relative: 1 %
Eosinophils Absolute: 0.5 10*3/uL (ref 0.0–0.5)
Eosinophils Relative: 5 %
HCT: 46.3 % (ref 39.0–52.0)
Hemoglobin: 15.4 g/dL (ref 13.0–17.0)
Immature Granulocytes: 1 %
Lymphocytes Relative: 26 %
Lymphs Abs: 2.7 10*3/uL (ref 0.7–4.0)
MCH: 33.5 pg (ref 26.0–34.0)
MCHC: 33.3 g/dL (ref 30.0–36.0)
MCV: 100.7 fL — ABNORMAL HIGH (ref 80.0–100.0)
Monocytes Absolute: 0.6 10*3/uL (ref 0.1–1.0)
Monocytes Relative: 6 %
Neutro Abs: 6.6 10*3/uL (ref 1.7–7.7)
Neutrophils Relative %: 61 %
Platelet Count: 195 10*3/uL (ref 150–400)
RBC: 4.6 MIL/uL (ref 4.22–5.81)
RDW: 14 % (ref 11.5–15.5)
WBC Count: 10.5 10*3/uL (ref 4.0–10.5)
nRBC: 0 % (ref 0.0–0.2)

## 2023-05-12 LAB — LACTATE DEHYDROGENASE: LDH: 117 U/L (ref 98–192)

## 2023-05-12 NOTE — Progress Notes (Signed)
Hematology/Oncology Progress note [phone] [phone]         Patient Care Team: Glori Luis, MD as PCP - General (Family Medicine) Antonieta Iba, MD as PCP - Cardiology (Cardiology) [name], MD as Consulting Physician (Oncology)   REFERRING PROVIDER: Glori Luis, MD  Microcytosis  ASSESSMENT & PLAN:  Macrocytosis Chronic macrocytosis.  Stable.  No anemia. Previous workup negative for hemolysis, normal multiple myeloma workup Patient is asymptomatic and macrocytosis is very mild.   Hold off bone marrow biopsy Recommend observation.   Low serum vitamin B12 B12 is stable Continue Vitamin B12 2-3 times per week.  Orders Placed This Encounter  Procedures   CBC with Differential (Cancer Center Only)    Standing Status:   Future    Standing Expiration Date:   05/11/2024   Vitamin B12    Standing Status:   Future    Standing Expiration Date:   05/11/2024   Folate    Standing Status:   Future    Standing Expiration Date:   05/11/2024   Follow up in 1 year  all questions were answered. The patient knows to call the clinic with any problems, questions or concerns.  [name], MD, PhD [facility] Hematology Oncology 05/12/2023     HISTORY OF PRESENTING ILLNESS:  Von Lownes is a  77 y.o.  male with PMH listed below who was referred to me for elevated MCV Reviewed patient's recent labs that was done.  He was found to have abnormal CBC on 03/19/22 with normal hemoglobin 15.2, MCV 102.4 Reviewed patient's previous labs ordered by primary care physician's office, anemia is chronic onset , duration is since 2022 He had not noticed any recent bleeding such as epistaxis, hematuria or hematochezia.  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  Denies alcohol use.  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 male who has above history reviewed by me today presents for follow up visit for macrocytosis.  Patient takes vitamin B12 supplementation 2-3 times per week..  He has no new complaints.   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's   Pre-diabetes     SURGICAL HISTORY: Past Surgical History:  Procedure Laterality Date   BACK SURGERY     x 3   CARPAL TUNNEL RELEASE Right 08/19/2017   Procedure: CARPAL TUNNEL RELEASE;  Surgeon: Kennedy Bucker, MD;  Location: [facility] ORS;  Service: Orthopedics;  Laterality:  Right;   CATARACT EXTRACTION W/PHACO Right 11/17/2016   Procedure: CATARACT EXTRACTION PHACO AND INTRAOCULAR LENS PLACEMENT (IOC);  Surgeon: Galen Manila, MD;  Location: [facility] ORS;  Service: Ophthalmology;  Laterality: Right;  [place] 00:37 AP% 12.6 CDE 4.69 Fluid pack lot # [id number]   CATARACT EXTRACTION W/PHACO Left 12/08/2016   Procedure: CATARACT EXTRACTION PHACO AND INTRAOCULAR LENS PLACEMENT (IOC);  Surgeon: Galen Manila, MD;  Location: [facility] ORS;  Service: Ophthalmology;  Laterality: Left;  [place] 00:33 AP% 13.9 CDE  4.63 Fluid pack lot # [id number]   TOE AMPUTATION Left    ULNAR TUNNEL RELEASE Right 08/19/2017   Procedure: CUBITAL TUNNEL RELEASE;  Surgeon: Kennedy Bucker, MD;  Location: [facility] ORS;  Service: Orthopedics;  Laterality: Right;    SOCIAL HISTORY: Social History   Socioeconomic History   Marital status: Married    Spouse name: Not on file   Number of children: Not on file   Years of education: Not on file   Highest education level: Not on file  Occupational History   Not on file  Tobacco Use   Smoking status: Every Day    Current packs/day: 0.25    Average packs/day: 0.3 packs/day for 65.0 years (16.3 ttl pk-yrs)    Types: Cigars, Cigarettes   Smokeless tobacco: Never   Tobacco comments:    0.5PPD 12/07/2022  Vaping Use   Vaping status: Never Used  Substance and Sexual Activity   Alcohol use: No   Drug use: No   Sexual activity: Not on file  Other Topics Concern   Not on file  Social History Narrative   Married   Social Determinants of Health   Financial Resource Strain: Low Risk  (03/30/2023)   Overall Financial Resource Strain (CARDIA)    Difficulty of Paying Living Expenses: Not hard at all  Food Insecurity: No Food Insecurity (03/30/2023)   Hunger Vital Sign    Worried About Running Out of Food in the Last Year: Never true    Ran Out of Food in the Last Year: Never true  Transportation Needs: No Transportation Needs (03/30/2023)   PRAPARE - Administrator, Civil Service (Medical): No    Lack of Transportation (Non-Medical): No  Physical Activity: Inactive (03/30/2023)   Exercise Vital Sign    Days of Exercise per Week: 0 days    Minutes of Exercise per Session: 0 min  Stress: No Stress Concern Present (03/30/2023)   [employer] of Occupational Health - Occupational Stress Questionnaire    Feeling of Stress : Only a little  Social Connections: Socially Integrated (03/30/2023)   Social Connection and Isolation Panel [NHANES]    Frequency of  Communication with Friends and Family: More than three times a week    Frequency of Social Gatherings with Friends and Family: More than three times a week    Attends Religious Services: More than 4 times per year    Active Member of [employer] or Organizations: Yes    Attends Engineer, structural: More than 4 times per year    Marital Status: Married  Catering manager Violence: Not At Risk (03/30/2023)   Humiliation, Afraid, Rape, and Kick questionnaire    Fear of Current or Ex-Partner: No    Emotionally Abused: No    Physically Abused: No    Sexually Abused: No    FAMILY HISTORY: Family History  Problem Relation Age of Onset   Heart attack Mother    Prostate cancer Father    Breast cancer Sister  82   Dementia Sister    Cancer Brother        unknown what kind    ALLERGIES:  is allergic to hydrocodone-acetaminophen and neurontin [gabapentin].  MEDICATIONS:  Current Outpatient Medications  Medication Sig Dispense Refill   ALPRAZolam (XANAX) 0.5 MG tablet TAKE 1 TABLET BY MOUTH AT BEDTIME AS NEEDED FOR ANXIETY. 30 tablet 1   apixaban (ELIQUIS) 5 MG TABS tablet TAKE ONE TABLET BY MOUTH TWICE DAILY 180 tablet 0   atenolol (TENORMIN) 25 MG tablet Take 1 tablet (25 mg total) by mouth at bedtime. 90 tablet 3   atorvastatin (LIPITOR) 80 MG tablet TAKE 1 TABLET BY MOUTH DAILY 90 tablet 1   carbamide peroxide (DEBROX) 6.5 % OTIC solution Place 5 drops into the left ear 2 (two) times daily. For 5-7 days 15 mL 0   cyanocobalamin (VITAMIN B12) 1000 MCG tablet Take 1 tablet (1,000 mcg total) by mouth daily. 90 tablet 1   Fluticasone-Umeclidin-Vilant (TRELEGY ELLIPTA) 100-62.5-25 MCG/ACT AEPB Inhale 1 puff into the lungs daily. 28 each 11   gabapentin (NEURONTIN) 100 MG capsule TAKE 1 CAPSULE BY MOUTH 3 TIMES DAILY. 270 capsule 1   nitroGLYCERIN (NITROSTAT) 0.4 MG SL tablet Place 1 tablet (0.4 mg total) under the tongue every 5 (five) minutes as needed for chest pain. For up to 3 doses per  episode. 50 tablet 0   saccharomyces boulardii (FLORASTOR) 250 MG capsule Take 1 capsule (250 mg total) by mouth daily. 90 capsule 0   traZODone (DESYREL) 150 MG tablet Take 1 tablet (150 mg total) by mouth at bedtime. 90 tablet 1   valsartan (DIOVAN) 320 MG tablet Take 1 tablet (320 mg total) by mouth daily. 90 tablet 1   No current facility-administered medications for this visit.    Review of Systems  Constitutional:  Negative for appetite change, chills, fatigue, fever and unexpected weight change.  HENT:   Negative for hearing loss and voice change.   Eyes:  Negative for eye problems and icterus.  Respiratory:  Negative for chest tightness, cough and shortness of breath.   Cardiovascular:  Negative for chest pain and leg swelling.  Gastrointestinal:  Negative for abdominal distention and abdominal pain.  Endocrine: Negative for hot flashes.  Genitourinary:  Negative for difficulty urinating, dysuria and frequency.   Musculoskeletal:  Negative for arthralgias.       History of melanoma s/p right 5th toe amputation.   Skin:  Negative for itching and rash.  Neurological:  Negative for light-headedness and numbness.  Hematological:  Negative for adenopathy. Does not bruise/bleed easily.  Psychiatric/Behavioral:  Negative for confusion.     PHYSICAL EXAMINATION: ECOG PERFORMANCE STATUS: 1 - Symptomatic but completely ambulatory Vitals:   05/12/23 0944  BP: 138/67  Pulse: 61  Resp: 18  Temp: (!) 97.1 F (36.2 C)  SpO2: 97%   Filed Weights   05/12/23 0944  Weight: 161 lb (73 kg)    Physical Exam Constitutional:      General: He is not in acute distress. HENT:     Head: Normocephalic and atraumatic.  Eyes:     General: No scleral icterus. Cardiovascular:     Rate and Rhythm: Normal rate and regular rhythm.     Heart sounds: Normal heart sounds.  Pulmonary:     Effort: Pulmonary effort is normal. No respiratory distress.     Breath sounds: No wheezing.  Abdominal:      General: Bowel sounds are normal. There is no distension.  Palpations: Abdomen is soft.  Musculoskeletal:        General: No deformity. Normal range of motion.     Cervical back: Normal range of motion and neck supple.  Skin:    General: Skin is warm and dry.     Findings: No erythema or rash.  Neurological:     Mental Status: He is alert and oriented to person, place, and time. Mental status is at baseline.     Cranial Nerves: No cranial nerve deficit.     Coordination: Coordination normal.  Psychiatric:        Mood and Affect: Mood normal.      LABORATORY DATA:  I have reviewed the data as listed    Latest Ref Rng & Units 05/12/2023    9:30 AM 10/30/2022    7:53 AM 03/26/2022   11:53 AM  CBC  WBC 4.0 - 10.5 K/uL 10.5  9.9  20.8   Hemoglobin 13.0 - 17.0 g/dL 36.6  44.0  34.7   Hematocrit 39.0 - 52.0 % 46.3  50.1  48.7   Platelets 150 - 400 K/uL 195  192  226       Latest Ref Rng & Units 04/09/2023   10:49 AM 03/15/2023    8:25 AM 10/30/2022    7:53 AM  CMP  Glucose 70 - 99 mg/dL 425  956  387   BUN 6 - 23 mg/dL 18  17  15    Creatinine 0.40 - 1.50 mg/dL 5.64  3.32  9.51   Sodium 135 - 145 mEq/L 138  141  137   Potassium 3.5 - 5.1 mEq/L 4.3  4.2  4.7   Chloride 96 - 112 mEq/L 103  103  103   CO2 19 - 32 mEq/L 30  30  27    Calcium 8.4 - 10.5 mg/dL 8.9  9.3  9.1   Total Protein 6.5 - 8.1 g/dL   7.0   Total Bilirubin 0.3 - 1.2 mg/dL   0.8   Alkaline Phos 38 - 126 U/L   86   AST 15 - 41 U/L   23   ALT 0 - 44 U/L   17       Component Value Date/Time   FERRITIN 469 (H) 07/26/2019 0330     RADIOGRAPHIC STUDIES: I have personally reviewed the radiological images as listed and agreed with the findings in the report. No results found.

## 2023-05-12 NOTE — Assessment & Plan Note (Addendum)
Chronic macrocytosis.  Stable.  No anemia. Previous workup negative for hemolysis, normal multiple myeloma workup Patient is asymptomatic and macrocytosis is very mild.   Hold off bone marrow biopsy Recommend observation.

## 2023-05-12 NOTE — Assessment & Plan Note (Addendum)
B12 is stable Continue Vitamin B12 2-3 times per week.

## 2023-05-18 DIAGNOSIS — M48062 Spinal stenosis, lumbar region with neurogenic claudication: Secondary | ICD-10-CM | POA: Diagnosis not present

## 2023-05-18 DIAGNOSIS — M5416 Radiculopathy, lumbar region: Secondary | ICD-10-CM | POA: Diagnosis not present

## 2023-05-27 ENCOUNTER — Other Ambulatory Visit: Payer: Self-pay | Admitting: Cardiovascular Disease

## 2023-05-27 DIAGNOSIS — I480 Paroxysmal atrial fibrillation: Secondary | ICD-10-CM

## 2023-05-27 NOTE — Telephone Encounter (Signed)
Prescription refill request for Eliquis received. Indication: AF Last office visit: 12/07/22  Concha Se MD Scr: 1.10 on 04/09/23  Epic Age: 77 Weight: 73.5kg  Based on above findings Eliquis 5mg  twice daily is the appropriate dose.  Refill approved.

## 2023-05-27 NOTE — Telephone Encounter (Signed)
Please review

## 2023-05-31 ENCOUNTER — Telehealth: Payer: Self-pay

## 2023-05-31 DIAGNOSIS — G479 Sleep disorder, unspecified: Secondary | ICD-10-CM

## 2023-05-31 MED ORDER — ALPRAZOLAM 0.5 MG PO TABS: ORAL_TABLET | ORAL | 1 refills | Status: DC

## 2023-05-31 NOTE — Telephone Encounter (Signed)
Called Patient to let him know DR. Birdie Sons called his medication in.

## 2023-05-31 NOTE — Telephone Encounter (Signed)
Sent to pharmacy 

## 2023-05-31 NOTE — Telephone Encounter (Signed)
Copied from CRM [id number]. Topic: Clinical - Medication Refill >> May 31, 2023  8:41 AM Almira Coaster wrote: Most Recent Primary Care Visit:  Provider: Dana Allan  Department: [facility]  Visit Type: OFFICE VISIT  Date: 04/30/2023  Medication: ALPRAZolam Prudy Feeler) 0.5 MG tablet  Has the patient contacted their pharmacy? Yes, they sent a refill request to the office.  (Agent: If no, request that the patient contact the pharmacy for the refill. If patient does not wish to contact the pharmacy document the reason why and proceed with request.) (Agent: If yes, when and what did the pharmacy advise?)  Is this the correct pharmacy for this prescription? Yes If no, delete pharmacy and type the correct one.  This is the patient's preferred pharmacy:  [facility] - [place], [place] - [address] [place] [address] Phone: [phone] Fax: [phone]     Has the prescription been filled recently? No  Is the patient out of the medication? No, patient has five pills left, but is going out of town and it will not be enough while he is away.  Has the patient been seen for an appointment in the last year OR does the patient have an upcoming appointment? Yes  Can we respond through MyChart? No  Agent: Please be advised that Rx refills may take up to 3 business days. We ask that you follow-up with your pharmacy.

## 2023-05-31 NOTE — Addendum Note (Signed)
Addended by: Birdie Sons, Mason Burleigh G on: 05/31/2023 12:16 PM   Modules accepted: Orders

## 2023-06-07 ENCOUNTER — Other Ambulatory Visit (INDEPENDENT_AMBULATORY_CARE_PROVIDER_SITE_OTHER): Payer: Self-pay | Admitting: Nurse Practitioner

## 2023-06-07 DIAGNOSIS — I739 Peripheral vascular disease, unspecified: Secondary | ICD-10-CM

## 2023-06-08 ENCOUNTER — Emergency Department: Payer: Medicare Other

## 2023-06-08 ENCOUNTER — Other Ambulatory Visit: Payer: Self-pay

## 2023-06-08 ENCOUNTER — Encounter: Payer: Self-pay | Admitting: Internal Medicine

## 2023-06-08 ENCOUNTER — Observation Stay
Admission: EM | Admit: 2023-06-08 | Discharge: 2023-06-09 | Disposition: A | Discharge status: Home / Self Care | Payer: Medicare Other | Attending: Internal Medicine | Admitting: Internal Medicine

## 2023-06-08 DIAGNOSIS — Z89422 Acquired absence of other left toe(s): Secondary | ICD-10-CM | POA: Insufficient documentation

## 2023-06-08 DIAGNOSIS — E860 Dehydration: Secondary | ICD-10-CM | POA: Diagnosis not present

## 2023-06-08 DIAGNOSIS — R0989 Other specified symptoms and signs involving the circulatory and respiratory systems: Secondary | ICD-10-CM | POA: Diagnosis not present

## 2023-06-08 DIAGNOSIS — R062 Wheezing: Secondary | ICD-10-CM | POA: Diagnosis not present

## 2023-06-08 DIAGNOSIS — Z720 Tobacco use: Secondary | ICD-10-CM | POA: Diagnosis present

## 2023-06-08 DIAGNOSIS — S0990XA Unspecified injury of head, initial encounter: Principal | ICD-10-CM

## 2023-06-08 DIAGNOSIS — I10 Essential (primary) hypertension: Secondary | ICD-10-CM | POA: Diagnosis not present

## 2023-06-08 DIAGNOSIS — I2510 Atherosclerotic heart disease of native coronary artery without angina pectoris: Secondary | ICD-10-CM | POA: Diagnosis not present

## 2023-06-08 DIAGNOSIS — R0902 Hypoxemia: Secondary | ICD-10-CM | POA: Diagnosis not present

## 2023-06-08 DIAGNOSIS — Z7901 Long term (current) use of anticoagulants: Secondary | ICD-10-CM | POA: Diagnosis not present

## 2023-06-08 DIAGNOSIS — R0602 Shortness of breath: Secondary | ICD-10-CM | POA: Diagnosis present

## 2023-06-08 DIAGNOSIS — U071 COVID-19: Secondary | ICD-10-CM | POA: Diagnosis not present

## 2023-06-08 DIAGNOSIS — J441 Chronic obstructive pulmonary disease with (acute) exacerbation: Secondary | ICD-10-CM | POA: Diagnosis not present

## 2023-06-08 DIAGNOSIS — F17210 Nicotine dependence, cigarettes, uncomplicated: Secondary | ICD-10-CM | POA: Diagnosis not present

## 2023-06-08 DIAGNOSIS — F419 Anxiety disorder, unspecified: Secondary | ICD-10-CM | POA: Diagnosis present

## 2023-06-08 DIAGNOSIS — E785 Hyperlipidemia, unspecified: Secondary | ICD-10-CM | POA: Diagnosis present

## 2023-06-08 DIAGNOSIS — Z85828 Personal history of other malignant neoplasm of skin: Secondary | ICD-10-CM | POA: Insufficient documentation

## 2023-06-08 DIAGNOSIS — Z1152 Encounter for screening for COVID-19: Secondary | ICD-10-CM | POA: Insufficient documentation

## 2023-06-08 DIAGNOSIS — I739 Peripheral vascular disease, unspecified: Secondary | ICD-10-CM | POA: Diagnosis present

## 2023-06-08 DIAGNOSIS — I4891 Unspecified atrial fibrillation: Secondary | ICD-10-CM | POA: Insufficient documentation

## 2023-06-08 DIAGNOSIS — F32A Depression, unspecified: Secondary | ICD-10-CM | POA: Insufficient documentation

## 2023-06-08 DIAGNOSIS — J9601 Acute respiratory failure with hypoxia: Secondary | ICD-10-CM

## 2023-06-08 DIAGNOSIS — I959 Hypotension, unspecified: Secondary | ICD-10-CM | POA: Diagnosis not present

## 2023-06-08 DIAGNOSIS — G479 Sleep disorder, unspecified: Secondary | ICD-10-CM | POA: Diagnosis present

## 2023-06-08 DIAGNOSIS — Z85820 Personal history of malignant melanoma of skin: Secondary | ICD-10-CM

## 2023-06-08 DIAGNOSIS — Z79899 Other long term (current) drug therapy: Secondary | ICD-10-CM | POA: Insufficient documentation

## 2023-06-08 DIAGNOSIS — R531 Weakness: Secondary | ICD-10-CM | POA: Insufficient documentation

## 2023-06-08 DIAGNOSIS — Z8616 Personal history of COVID-19: Secondary | ICD-10-CM | POA: Insufficient documentation

## 2023-06-08 DIAGNOSIS — J449 Chronic obstructive pulmonary disease, unspecified: Secondary | ICD-10-CM | POA: Diagnosis present

## 2023-06-08 DIAGNOSIS — W19XXXA Unspecified fall, initial encounter: Secondary | ICD-10-CM | POA: Diagnosis not present

## 2023-06-08 HISTORY — DX: COVID-19: U07.1

## 2023-06-08 HISTORY — DX: Acute respiratory failure with hypoxia: J96.01

## 2023-06-08 LAB — TROPONIN I (HIGH SENSITIVITY)
Troponin I (High Sensitivity): 8 ng/L (ref <18)
Troponin I (High Sensitivity): 8 ng/L (ref <18)

## 2023-06-08 LAB — CBC
HCT: 44.8 % (ref 39.0–52.0)
Hemoglobin: 15 g/dL (ref 13.0–17.0)
MCH: 33.9 pg (ref 26.0–34.0)
MCHC: 33.5 g/dL (ref 30.0–36.0)
MCV: 101.4 fL — ABNORMAL HIGH (ref 80.0–100.0)
Platelets: 153 10*3/uL (ref 150–400)
RBC: 4.42 MIL/uL (ref 4.22–5.81)
RDW: 14.1 % (ref 11.5–15.5)
WBC: 10.4 10*3/uL (ref 4.0–10.5)
nRBC: 0 % (ref 0.0–0.2)

## 2023-06-08 LAB — RESP PANEL BY RT-PCR (RSV, FLU A&B, COVID)  RVPGX2
Influenza A by PCR: NEGATIVE
Influenza B by PCR: NEGATIVE
Resp Syncytial Virus by PCR: NEGATIVE
SARS Coronavirus 2 by RT PCR: POSITIVE — AB

## 2023-06-08 LAB — COMPREHENSIVE METABOLIC PANEL
ALT: 17 U/L (ref 0–44)
AST: 23 U/L (ref 15–41)
Albumin: 3.9 g/dL (ref 3.5–5.0)
Alkaline Phosphatase: 76 U/L (ref 38–126)
Anion gap: 8 (ref 5–15)
BUN: 19 mg/dL (ref 8–23)
CO2: 25 mmol/L (ref 22–32)
Calcium: 8.5 mg/dL — ABNORMAL LOW (ref 8.9–10.3)
Chloride: 103 mmol/L (ref 98–111)
Creatinine, Ser: 1 mg/dL (ref 0.61–1.24)
GFR, Estimated: >60 mL/min (ref >60)
Glucose, Bld: 143 mg/dL — ABNORMAL HIGH (ref 70–99)
Potassium: 3.8 mmol/L (ref 3.5–5.1)
Sodium: 136 mmol/L (ref 135–145)
Total Bilirubin: 1 mg/dL (ref 0.0–1.2)
Total Protein: 6.5 g/dL (ref 6.5–8.1)

## 2023-06-08 LAB — URINALYSIS, ROUTINE W REFLEX MICROSCOPIC
Bacteria, UA: NONE SEEN
Bilirubin Urine: NEGATIVE
Glucose, UA: NEGATIVE mg/dL
Hgb urine dipstick: NEGATIVE
Ketones, ur: NEGATIVE mg/dL
Leukocytes,Ua: NEGATIVE
Nitrite: NEGATIVE
Protein, ur: 100 mg/dL — AB
Specific Gravity, Urine: 1.027 (ref 1.005–1.030)
Squamous Epithelial / HPF: 0 /[HPF] (ref 0–5)
pH: 5 (ref 5.0–8.0)

## 2023-06-08 MED ORDER — ONDANSETRON HCL 4 MG/2ML IJ SOLN
4.0000 mg | Freq: Four times a day (QID) | INTRAMUSCULAR | Status: DC | PRN
Start: 2023-06-08 — End: 2023-06-13

## 2023-06-08 MED ORDER — ALPRAZOLAM 0.5 MG PO TABS: 0.5000 mg | ORAL_TABLET | Freq: Every evening | ORAL | Status: DC | PRN

## 2023-06-08 MED ORDER — METHYLPREDNISOLONE SODIUM SUCC 125 MG IJ SOLR
125.0000 mg | Freq: Every day | INTRAMUSCULAR | Status: AC
  Administered 2023-06-08 – 2023-06-09 (×2): 125 mg via INTRAVENOUS
  Filled 2023-06-08 (×2): qty 2

## 2023-06-08 MED ORDER — NITROGLYCERIN 0.4 MG SL SUBL: 0.4000 mg | SUBLINGUAL_TABLET | SUBLINGUAL | Status: DC | PRN

## 2023-06-08 MED ORDER — APIXABAN 5 MG PO TABS
5.0000 mg | ORAL_TABLET | Freq: Two times a day (BID) | ORAL | Status: DC
  Administered 2023-06-08 – 2023-06-09 (×3): 5 mg via ORAL
  Filled 2023-06-08 (×3): qty 1

## 2023-06-08 MED ORDER — ATENOLOL 25 MG PO TABS
25.0000 mg | ORAL_TABLET | Freq: Every day | ORAL | Status: DC
  Administered 2023-06-08: 25 mg via ORAL
  Filled 2023-06-08: qty 1

## 2023-06-08 MED ORDER — TRAZODONE HCL 50 MG PO TABS
150.0000 mg | ORAL_TABLET | Freq: Every day | ORAL | Status: DC
  Administered 2023-06-08: 150 mg via ORAL
  Filled 2023-06-08: qty 1

## 2023-06-08 MED ORDER — HYDRALAZINE HCL 20 MG/ML IJ SOLN: 5.0000 mg | Freq: Four times a day (QID) | INTRAMUSCULAR | Status: DC | PRN

## 2023-06-08 MED ORDER — SACCHAROMYCES BOULARDII 250 MG PO CAPS
250.0000 mg | ORAL_CAPSULE | Freq: Every day | ORAL | Status: DC
  Administered 2023-06-08 – 2023-06-09 (×2): 250 mg via ORAL
  Filled 2023-06-08 (×2): qty 1

## 2023-06-08 MED ORDER — TRAMADOL HCL 50 MG PO TABS: 50.0000 mg | ORAL_TABLET | Freq: Two times a day (BID) | ORAL | Status: DC | PRN

## 2023-06-08 MED ORDER — ACETAMINOPHEN 650 MG RE SUPP
650.0000 mg | Freq: Four times a day (QID) | RECTAL | Status: DC | PRN
Start: 2023-06-08 — End: 2023-06-13

## 2023-06-08 MED ORDER — GABAPENTIN 100 MG PO CAPS
100.0000 mg | ORAL_CAPSULE | Freq: Every day | ORAL | Status: DC
  Administered 2023-06-08: 100 mg via ORAL
  Filled 2023-06-08: qty 1

## 2023-06-08 MED ORDER — ONDANSETRON HCL 4 MG PO TABS: 4.0000 mg | ORAL_TABLET | Freq: Four times a day (QID) | ORAL | Status: DC | PRN

## 2023-06-08 MED ORDER — UMECLIDINIUM BROMIDE 62.5 MCG/ACT IN AEPB
1.0000 | INHALATION_SPRAY | Freq: Every day | RESPIRATORY_TRACT | Status: DC
  Administered 2023-06-08 – 2023-06-09 (×2): 1 via RESPIRATORY_TRACT
  Filled 2023-06-08: qty 7

## 2023-06-08 MED ORDER — METHYLPREDNISOLONE SODIUM SUCC 40 MG IJ SOLR: 40.0000 mg | Freq: Every day | INTRAMUSCULAR | Status: DC

## 2023-06-08 MED ORDER — ACETAMINOPHEN 325 MG PO TABS: 650.0000 mg | ORAL_TABLET | Freq: Four times a day (QID) | ORAL | Status: DC | PRN

## 2023-06-08 MED ORDER — IRBESARTAN 150 MG PO TABS
300.0000 mg | ORAL_TABLET | Freq: Every day | ORAL | Status: DC
  Administered 2023-06-09: 300 mg via ORAL
  Filled 2023-06-08: qty 2

## 2023-06-08 MED ORDER — LACTATED RINGERS IV BOLUS
1000.0000 mL | Freq: Once | INTRAVENOUS | Status: AC
Start: 2023-06-08 — End: 2023-06-08
  Administered 2023-06-08: 1000 mL via INTRAVENOUS

## 2023-06-08 MED ORDER — IPRATROPIUM-ALBUTEROL 0.5-2.5 (3) MG/3ML IN SOLN
3.0000 mL | Freq: Four times a day (QID) | RESPIRATORY_TRACT | Status: AC
  Administered 2023-06-09: 3 mL via RESPIRATORY_TRACT
  Filled 2023-06-08: qty 3

## 2023-06-08 MED ORDER — CYANOCOBALAMIN 500 MCG PO TABS
1000.0000 ug | ORAL_TABLET | Freq: Every day | ORAL | Status: DC
  Administered 2023-06-08 – 2023-06-09 (×2): 1000 ug via ORAL
  Filled 2023-06-08 (×2): qty 2

## 2023-06-08 MED ORDER — ATORVASTATIN CALCIUM 20 MG PO TABS
80.0000 mg | ORAL_TABLET | Freq: Every day | ORAL | Status: DC
  Administered 2023-06-08: 80 mg via ORAL
  Filled 2023-06-08: qty 4

## 2023-06-08 MED ORDER — FLUTICASONE FUROATE-VILANTEROL 100-25 MCG/ACT IN AEPB
1.0000 | INHALATION_SPRAY | Freq: Every day | RESPIRATORY_TRACT | Status: DC
  Administered 2023-06-08 – 2023-06-09 (×2): 1 via RESPIRATORY_TRACT
  Filled 2023-06-08: qty 28

## 2023-06-08 MED ORDER — IPRATROPIUM-ALBUTEROL 0.5-2.5 (3) MG/3ML IN SOLN
9.0000 mL | Freq: Once | RESPIRATORY_TRACT | Status: AC
  Administered 2023-06-08: 9 mL via RESPIRATORY_TRACT
  Filled 2023-06-08: qty 9

## 2023-06-08 MED ORDER — NICOTINE 21 MG/24HR TD PT24
21.0000 mg | MEDICATED_PATCH | Freq: Every day | TRANSDERMAL | Status: DC | PRN
  Administered 2023-06-08: 21 mg via TRANSDERMAL
  Filled 2023-06-08: qty 1

## 2023-06-08 MED ORDER — SENNOSIDES-DOCUSATE SODIUM 8.6-50 MG PO TABS: 1.0000 | ORAL_TABLET | Freq: Every evening | ORAL | Status: DC | PRN

## 2023-06-08 NOTE — Assessment & Plan Note (Signed)
 With anxiety Home trazodone 150 mg nightly resumed Alprazolam 0.5 mg nightly as needed for anxiety, sleep ordered

## 2023-06-08 NOTE — Assessment & Plan Note (Signed)
-   As needed nicotine patch ordered ?

## 2023-06-08 NOTE — Assessment & Plan Note (Signed)
 Home atenolol 25 mg nightly resumed Eliquis 5 mg p.o. twice daily resumed on admission

## 2023-06-08 NOTE — Assessment & Plan Note (Signed)
Atorvastatin 80 mg nightly resumed, nitroglycerin sublingual every 5 minutes as needed for chest pain ordered

## 2023-06-08 NOTE — Assessment & Plan Note (Signed)
Home atorvastatin 80 mg nightly resumed on admission

## 2023-06-08 NOTE — ED Notes (Signed)
Pt placed on O2 at 2 l [place] in triage

## 2023-06-08 NOTE — Assessment & Plan Note (Signed)
 Presume multifactorial in setting of COVID-19 infection causing COPD exacerbation Continue oxygen supplementation to maintain SpO2 greater than 92% Continuous pulse oximetry ordered on admission

## 2023-06-08 NOTE — H&P (Addendum)
 History and Physical   Karl Myers FMW:[id number] DOB: 05-23-1946 DOA: 06/08/2023  PCP: Karl Camellia MATSU, MD  Outpatient Specialists: Dr. Babara, hematology/oncology Patient coming from: Home via EMS  I have personally briefly reviewed patient's old medical records in [facility] EMR.  Chief Concern: Weakness, fell on Sunday  HPI: [name] is a 77 year old male with history of COPD, hypertension, history of melanoma, history of tobacco use, who presents emergency department for chief concerns of weakness after a fall on Sunday.  Vitals in the ED showed temperature of 99.9, respiration rate 20, heart rate 69, blood pressure 103/59, SpO2 of 93% on 2 L nasal cannula.  Serum sodium is 136, potassium 3.8, chloride 103, bicarb 25, BUN 19, serum creatinine 1.00, EGFR greater than 60, nonfasting blood glucose 143, WBC 10.4, hemoglobin 15, platelets of 153.  High sensitivity troponin is 8 and on repeat is 8.  Patient tested positive by COVID PCR.  Influenza A/influenza B/RSV PCR were negative.  ED treatment: DuoNebs 9 mg liter nebulizer one-time dose ordered, LR 1 L bolus. --------------------------------- At bedside, patient he is able to tell me his name, age, current location, month is December. He states the year is 2026.   He does not know how he fell. He fell on Sunday on his left face. He denies lost of consciousness. His spouse Karl Myers states he was walking down the sloop outside at [employer] walking and he fell. He denies chest pain, shortness of breath, dysuria, hematuria, blood in his stool. He endorses off/on diarrhea for about two years.   He endorses a dry cough that has been ongoing for years and is unchanged.   Social history: He lives at home with his wife. He smokes less than 1 pack per day. He denies etoh, recreational drug use. 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   ROS: Constitutional: no weight change, no  fever ENT/Mouth: no sore throat, no rhinorrhea Eyes: no eye pain, no vision changes Cardiovascular: no chest pain, + dyspnea,  no edema, no palpitations Respiratory: + cough, no sputum, no wheezing Gastrointestinal: no nausea, no vomiting, no diarrhea, no constipation Genitourinary: no urinary incontinence, no dysuria, no hematuria Musculoskeletal: no arthralgias, no myalgias Skin: no skin lesions, no pruritus, Neuro: + weakness, no loss of consciousness, no syncope Psych: no anxiety, no depression, + decrease appetite Heme/Lymph: no bruising, no bleeding  ED Course: Discussed with the EDP, patient requiring hospitalization for chief concerns of COPD exacerbation.  Assessment/Plan  Principal Problem:   COPD exacerbation (HCC) Active Problems:   Hypertension   Hyperlipidemia   Sleeping difficulty   Tobacco abuse   CAD (coronary artery disease)   COPD (chronic obstructive pulmonary disease) (HCC)   Anxiety   Atrial fibrillation (HCC)   History of melanoma   PAD (peripheral artery disease) (HCC)   Depression   Acute hypoxic respiratory failure (HCC)   Weakness   COVID-19 virus infection   Assessment and Plan:  * COPD exacerbation (HCC) Suspect secondary to COVID-19 infection Status post DuoNebs 9 mL Nebulizer one-time dose ordered DuoNebs 4 times daily, 3 doses ordered on admission Solu-Medrol  125 mg IV daily, 2 doses ordered on admission Continuous pulse oximetry Maintain SpO2 greater than 92% Admit to telemetry medical, inpatient  COVID-19 virus infection Treat per above  Weakness Spouse is concerned that patient may not be able to walk or take care of himself if he goes home.  And she is by  herself with him. PT, OT consulted Fall precautions  Acute hypoxic respiratory failure (HCC) Presume multifactorial in setting of COVID-19 infection causing COPD exacerbation Continue oxygen supplementation to maintain SpO2 greater than 92% Continuous pulse oximetry ordered  on admission  Depression With anxiety Home trazodone  150 mg nightly resumed Alprazolam  0.5 mg nightly as needed for anxiety, sleep ordered  Atrial fibrillation (HCC) Home atenolol  25 mg nightly resumed Eliquis  5 mg p.o. twice daily resumed on admission  CAD (coronary artery disease) Atorvastatin  80 mg nightly resumed, nitroglycerin  sublingual every 5 minutes as needed for chest pain ordered  Tobacco abuse As needed nicotine  patch ordered  Sleeping difficulty Trazodone  150 mg nightly and alprazolam  0.5 mg nightly as needed for anxiety, sleep resumed on admission  Hyperlipidemia Home atorvastatin  80 mg nightly resumed on admission  Hypertension Hydralazine  5 mg IV every 6 hours as needed for SBP > 160, 5 days ordered  Chart reviewed.   DVT prophylaxis: Eliquis  5 mg p.o. 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   Pre-diabetes    Past Surgical History:  Procedure Laterality Date   BACK SURGERY     x 3   CARPAL TUNNEL RELEASE Right 08/19/2017   Procedure: CARPAL TUNNEL RELEASE;  Surgeon: Kathlynn Sharper, MD;  Location: [facility] ORS;  Service: Orthopedics;  Laterality: Right;   CATARACT EXTRACTION W/PHACO Right 11/17/2016   Procedure: CATARACT EXTRACTION PHACO AND INTRAOCULAR LENS PLACEMENT (IOC);  Surgeon: Jaye Fallow, MD;  Location: [facility] ORS;  Service: Ophthalmology;  Laterality: Right;  US  00:37 AP% 12.6 CDE 4.69 Fluid pack  lot # [id number]   CATARACT EXTRACTION W/PHACO Left 12/08/2016   Procedure: CATARACT EXTRACTION PHACO AND INTRAOCULAR LENS PLACEMENT (IOC);  Surgeon: Jaye Fallow, MD;  Location: [facility] ORS;  Service: Ophthalmology;  Laterality: Left;  US  00:33 AP% 13.9 CDE 4.63 Fluid pack lot # [id number]   TOE AMPUTATION Left    ULNAR TUNNEL RELEASE Right 08/19/2017   Procedure: CUBITAL TUNNEL RELEASE;  Surgeon: Kathlynn Sharper, MD;  Location: [facility] ORS;  Service: Orthopedics;  Laterality: Right;   Social History:  reports that he has been smoking cigars and cigarettes. He has a 16.3 pack-year smoking history. He has never used smokeless tobacco. He reports that he does not drink alcohol and does not use drugs.  Allergies  Allergen Reactions   Hydrocodone-Acetaminophen  Other (See Comments)    With large quantities hyper   Neurontin  [Gabapentin ] Other (See Comments)    With large quantities hyper    Family History  Problem Relation Age of Onset   Heart attack Mother    Prostate cancer Father    Breast cancer Sister 30   Dementia Sister    Cancer Brother        unknown what kind   Family history: Family history reviewed and not pertinent.   Prior to Admission medications   Medication Sig Start Date End Date Taking? Authorizing Provider  ALPRAZolam  (XANAX ) 0.5 MG  tablet TAKE 1 TABLET BY MOUTH AT BEDTIME AS NEEDED FOR ANXIETY. 05/31/23  Yes Karl Camellia MATSU, MD  apixaban  (ELIQUIS ) 5 MG TABS tablet TAKE ONE TABLET BY MOUTH TWICE DAILY 05/27/23  Yes Gollan, Timothy J, MD  atenolol  (TENORMIN ) 25 MG tablet Take 1 tablet (25 mg total) by mouth at bedtime. 12/09/22  Yes Karl Camellia MATSU, MD  atorvastatin  (LIPITOR) 80 MG tablet TAKE 1 TABLET BY MOUTH DAILY 03/10/23  Yes Karl Camellia MATSU, MD  cyanocobalamin  (VITAMIN B12) 1000 MCG tablet Take 1 tablet (1,000 mcg total) by mouth daily. 03/26/22  Yes Karl Myers Call, MD  gabapentin  (NEURONTIN ) 100 MG capsule TAKE 1 CAPSULE BY MOUTH 3 TIMES DAILY. Patient taking  differently: Take 100 mg by mouth at bedtime. 05/11/23  Yes Karl Camellia MATSU, MD  nitroGLYCERIN  (NITROSTAT ) 0.4 MG SL tablet Place 1 tablet (0.4 mg total) under the tongue every 5 (five) minutes as needed for chest pain. For up to 3 doses per episode. 02/21/19  Yes Karl Camellia MATSU, MD  saccharomyces boulardii (FLORASTOR) 250 MG capsule Take 1 capsule (250 mg total) by mouth daily. 04/30/23  Yes Hope Merle, MD  traMADol  (ULTRAM ) 50 MG tablet Take 50 mg by mouth 2 (two) times daily as needed for moderate pain (pain score 4-6) or severe pain (pain score 7-10). 05/18/23  Yes [provider]  traZODone  (DESYREL ) 150 MG tablet Take 1 tablet (150 mg total) by mouth at bedtime. 12/09/22  Yes Karl Camellia MATSU, MD  valsartan  (DIOVAN ) 320 MG tablet Take 1 tablet (320 mg total) by mouth daily. 03/31/23  Yes Karl Camellia MATSU, MD  Fluticasone -Umeclidin-Vilant (TRELEGY ELLIPTA ) 100-62.5-25 MCG/ACT AEPB Inhale 1 puff into the lungs daily. Patient not taking: Reported on 06/08/2023 09/11/22   Karl Camellia MATSU, MD   Physical Exam: Vitals:   06/08/23 0337 06/08/23 0610 06/08/23 1433  BP: (!) 120/54 (!) 103/59   Pulse: 76 69   Resp: 20 20   Temp: 98.3 F (36.8 C) 99.9 F (37.7 C) 98.6 F (37 C)  TempSrc: Oral Oral Oral  SpO2: (!) 88% 93%   Weight: 81.6 kg    Height: 5' 8 (1.727 m)     Constitutional: appears age appropriate, NAD, calm Eyes: PERRL, lids and conjunctivae normal ENMT: Mucous membranes are moist. Posterior pharynx clear of any exudate or lesions. Age-appropriate dentition. Hearing appropriate Neck: normal, supple, no masses, no thyromegaly Respiratory: clear to auscultation bilaterally, + end expiratory wheezing, no crackles. Normal respiratory effort. No accessory muscle use.  Cardiovascular: Regular rate and rhythm, no murmurs / rubs / gallops. No extremity edema. 2+ pedal pulses. No carotid bruits.  Abdomen: no tenderness, no masses palpated, no hepatosplenomegaly. Bowel  sounds positive.  Musculoskeletal: no clubbing / cyanosis. No joint deformity upper and lower extremities. Good ROM, no contractures, no atrophy. Normal muscle tone.  Skin: no rashes, lesions, ulcers. No induration Neurologic: Sensation intact. Strength 5/5 in all 4.  Psychiatric: Normal judgment and insight. Alert and oriented x 3. Normal mood.   EKG: independently reviewed, showing sinus rhythm with rate of 76, QTc 405  Chest x-ray on Admission: I personally reviewed and I agree with radiologist reading as below.  CT Head Wo Contrast Result Date: 06/08/2023 CLINICAL DATA:  Multiple falls, generalized weakness, hypoxia EXAM: CT HEAD WITHOUT CONTRAST TECHNIQUE: Contiguous axial images were obtained from the base of the skull through the vertex without intravenous contrast. RADIATION DOSE REDUCTION: This exam was performed according to the departmental dose-optimization program which includes automated exposure  control, adjustment of the mA and/or kV according to patient size and/or use of iterative reconstruction technique. COMPARISON:  07/27/2022, 12/17/2022 FINDINGS: Brain: Chronic small vessel ischemic changes are again noted within the basal ganglia and periventricular white matter, stable. Stable generalized cerebral atrophy. No acute infarct or hemorrhage. Lateral ventricles and remaining midline structures are stable. No acute extra-axial fluid collections. No mass effect. Vascular: No hyperdense vessel or unexpected calcification. Skull: Normal. Negative for fracture or focal lesion. Sinuses/Orbits: Mucous retention cysts or polyps within the anterior ethmoid air cells. Remaining paranasal sinuses are clear. Other: None. IMPRESSION: 1. Stable head CT, no acute intracranial process. Electronically Signed   By: Ozell Daring M.D.   On: 06/08/2023 14:57   DG Chest 2 View Result Date: 06/08/2023 CLINICAL DATA:  Generalized weakness. EXAM: CHEST - 2 VIEW COMPARISON:  May 22, 2021 FINDINGS:  The heart size and mediastinal contours are within normal limits. Low lung volumes are noted without evidence of an acute infiltrate, pleural effusion or pneumothorax. Multilevel degenerative changes seen throughout the thoracic spine. IMPRESSION: Low lung volumes without evidence of acute cardiopulmonary disease. Electronically Signed   By: Suzen Dials M.D.   On: 06/08/2023 05:30   Labs on Admission: I have personally reviewed following labs  CBC: Recent Labs  Lab 06/08/23 0341  WBC 10.4  HGB 15.0  HCT 44.8  MCV 101.4*  PLT 153   Basic Metabolic Panel: Recent Labs  Lab 06/08/23 0341  NA 136  K 3.8  CL 103  CO2 25  GLUCOSE 143*  BUN 19  CREATININE 1.00  CALCIUM  8.5*   GFR: Estimated Creatinine Clearance: 59.9 mL/min (by C-G formula based on SCr of 1 mg/dL).  Liver Function Tests: Recent Labs  Lab 06/08/23 0341  AST 23  ALT 17  ALKPHOS 76  BILITOT 1.0  PROT 6.5  ALBUMIN 3.9   Urine analysis:    Component Value Date/Time   COLORURINE YELLOW (A) 06/08/2023 1430   APPEARANCEUR CLEAR (A) 06/08/2023 1430   LABSPEC 1.027 06/08/2023 1430   PHURINE 5.0 06/08/2023 1430   GLUCOSEU NEGATIVE 06/08/2023 1430   HGBUR NEGATIVE 06/08/2023 1430   BILIRUBINUR NEGATIVE 06/08/2023 1430   KETONESUR NEGATIVE 06/08/2023 1430   PROTEINUR 100 (A) 06/08/2023 1430   NITRITE NEGATIVE 06/08/2023 1430   LEUKOCYTESUR NEGATIVE 06/08/2023 1430   This document was prepared using Dragon Voice Recognition software and may include unintentional dictation errors.  Dr. Sherre [facility]  If 7PM-7AM, please contact overnight-coverage provider If 7AM-7PM, please contact day attending provider [email or web address]  06/08/2023, 5:34 PM

## 2023-06-08 NOTE — Assessment & Plan Note (Signed)
Spouse is concerned that patient may not be able to walk or take care of himself if he goes home.  And she is by herself with him. PT, OT consulted Fall precautions

## 2023-06-08 NOTE — Assessment & Plan Note (Signed)
 Suspect secondary to COVID-19 infection Status post DuoNebs 9 mL Nebulizer one-time dose ordered DuoNebs 4 times daily, 3 doses ordered on admission Solu-Medrol  125 mg IV daily, 2 doses ordered on admission Continuous pulse oximetry Maintain SpO2 greater than 92% Admit to telemetry medical, inpatient

## 2023-06-08 NOTE — Assessment & Plan Note (Signed)
 Trazodone 150 mg nightly and alprazolam 0.5 mg nightly as needed for anxiety, sleep resumed on admission

## 2023-06-08 NOTE — ED Triage Notes (Signed)
 Pt in with co weakness from home. Brought in by [facility] states has been falling recently, pt has had generalized weakness. PT has hx of copd per EMS RA sats 88%. Pt has old abrasions to face but unable to give history. Pt with no acute distress noted but does seem somnolent .

## 2023-06-08 NOTE — Hospital Course (Addendum)
 [name] is a 77 year old male with history of COPD, ongoing tobacco use, HTN, history of melanoma, who presented to the ER with shortness of breath, cough, weakness. Workup was positive for COVID-19 infection.  He was briefly started on oxygen for oxygen saturation 88% on admission.  He was able to be weaned back to room air and ambulated with lowest desaturation 91% prior to discharge. He was strongly recommended to refrain from ongoing smoking at discharge.  Due to quick subjective improvement, he was comfortable with discharging home for ongoing supportive care at home. He had not been using his Trelegy inhaler and this was refilled at discharge as well.

## 2023-06-08 NOTE — ED Provider Notes (Signed)
 [facility] Provider Note    Event Date/Time   First MD Initiated Contact with Patient 06/08/23 1132     (approximate)   History   Weakness   HPI  Karl Myers is a 77 y.o. male past medical history significant for hypertension, history of melanoma, who presents to the emergency department with not feeling well with cough and weakness.  States that on Sunday started having generalized weakness and fatigue.  Cough and congestion was not feeling well.  Had a fall on Sunday where he had hit his hand on the right hand and his head.  States that this morning he was unable to get out of bed secondary to generalized weakness.  No new falls since Sunday.  Complaining of significant cough and some shortness of breath.  On arrival found to be 88% on room air.  Does endorse a history of COPD and tobacco use.  Denies recent hospitalization.     Physical Exam   Triage Vital Signs: ED Triage Vitals [06/08/23 0337]  Encounter Vitals Group     BP (!) 120/54     Systolic BP Percentile      Diastolic BP Percentile      Pulse Rate 76     Resp 20     Temp 98.3 F (36.8 C)     Temp Source Oral     SpO2 (!) 88 %     Weight 180 lb (81.6 kg)     Height 5' 8 (1.727 m)     Head Circumference      Peak Flow      Pain Score 0     Pain Loc      Pain Education      Exclude from Growth Chart     Most recent vital signs: Vitals:   06/08/23 0337 06/08/23 0610  BP: (!) 120/54 (!) 103/59  Pulse: 76 69  Resp: 20 20  Temp: 98.3 F (36.8 C) 99.9 F (37.7 C)  SpO2: (!) 88% 93%    Physical Exam Constitutional:      Appearance: He is well-developed. He is ill-appearing.  HENT:     Head:     Comments: Abrasion to the left cheek. Eyes:     Extraocular Movements: Extraocular movements intact.     Conjunctiva/sclera: Conjunctivae normal.     Pupils: Pupils are equal, round, and reactive to light.  Cardiovascular:     Rate and Rhythm: Regular rhythm.  Pulmonary:      Effort: Respiratory distress present.     Breath sounds: Wheezing and rhonchi present.     Comments: 88% on room air, placed on 2 L nasal cannula. Abdominal:     Tenderness: There is no abdominal tenderness.  Musculoskeletal:        General: Normal range of motion.     Cervical back: Normal range of motion.     Right lower leg: No edema.     Left lower leg: No edema.  Skin:    General: Skin is warm.     Capillary Refill: Capillary refill takes less than 2 seconds.  Neurological:     Mental Status: He is alert. Mental status is at baseline.     IMPRESSION / MDM / ASSESSMENT AND PLAN / ED COURSE  I reviewed the triage vital signs and the nursing notes.  Differential diagnosis including dehydration, viral illness including COVID/influenza, pneumonia, intracranial hemorrhage, infarction, electrolyte abnormality, urinary tract infection  EKG  I, Clotilda Punter,  the attending physician, personally viewed and interpreted this ECG.   Rate: Normal  Rhythm: Normal sinus  Axis: Normal  Intervals: Normal  ST&T Change: None  RADIOLOGY I independently reviewed imaging, my interpretation of imaging: Chest x-ray with no focal findings consistent with pneumonia.  Read as low lung volumes.  LABS (all labs ordered are listed, but only abnormal results are displayed) Labs interpreted as -    Labs Reviewed  RESP PANEL BY RT-PCR (RSV, FLU A&B, COVID)  [id number] - Abnormal; Notable for the following components:      Result Value   SARS Coronavirus 2 by RT PCR POSITIVE (*)    All other components within normal limits  CBC - Abnormal; Notable for the following components:   MCV 101.4 (*)    All other components within normal limits  COMPREHENSIVE METABOLIC PANEL - Abnormal; Notable for the following components:   Glucose, Bld 143 (*)    Calcium  8.5 (*)    All other components within normal limits  URINALYSIS, ROUTINE W REFLEX MICROSCOPIC  TROPONIN I (HIGH SENSITIVITY)  TROPONIN I (HIGH  SENSITIVITY)     MDM    COVID tested positive.  Creatinine at baseline.  No significant electrolyte abnormality.  Does appear clinically dehydrated.  Will given 1 L of IV fluids and will reevaluate.  Troponin is negative, have a low suspicion for ACS.  Chest x-ray with no focal findings consistent with pneumonia.  Concerning for COPD exacerbation secondary to COVID.  Acute hypoxia.  Given DuoNeb treatment.  Adding on a CT scan of the head given recent fall and new weakness.  Consulted hospitalist for admission for dehydration, COVID, acute hypoxia, COPD exacerbation   PROCEDURES:  Critical Care performed: No  Procedures  Patient's presentation is most consistent with acute presentation with potential threat to life or bodily function.   MEDICATIONS ORDERED IN ED: Medications  ipratropium-albuterol  (DUONEB) 0.5-2.5 (3) MG/3ML nebulizer solution 9 mL (has no administration in time range)  lactated ringers  bolus 1,000 mL (has no administration in time range)    FINAL CLINICAL IMPRESSION(S) / ED DIAGNOSES   Final diagnoses:  Traumatic injury of head, initial encounter  Weakness  Dehydration  COVID  Hypoxia     Rx / DC Orders   ED Discharge Orders     None        Note:  This document was prepared using Dragon voice recognition software and may include unintentional dictation errors.   Suzanne Kirsch, MD 06/08/23 [phone]

## 2023-06-08 NOTE — ED Notes (Signed)
Family refused Duoneb due to patient agitation

## 2023-06-08 NOTE — Assessment & Plan Note (Signed)
-   Treat per above 

## 2023-06-08 NOTE — Assessment & Plan Note (Signed)
Hydralazine 5 mg IV every 6 hours as needed for SBP >160, 5 days ordered

## 2023-06-09 DIAGNOSIS — J441 Chronic obstructive pulmonary disease with (acute) exacerbation: Principal | ICD-10-CM

## 2023-06-09 DIAGNOSIS — Z8616 Personal history of COVID-19: Secondary | ICD-10-CM | POA: Diagnosis not present

## 2023-06-09 DIAGNOSIS — Z85828 Personal history of other malignant neoplasm of skin: Secondary | ICD-10-CM | POA: Diagnosis not present

## 2023-06-09 DIAGNOSIS — U071 COVID-19: Secondary | ICD-10-CM | POA: Diagnosis present

## 2023-06-09 DIAGNOSIS — J9601 Acute respiratory failure with hypoxia: Secondary | ICD-10-CM | POA: Diagnosis not present

## 2023-06-09 DIAGNOSIS — R531 Weakness: Secondary | ICD-10-CM | POA: Diagnosis not present

## 2023-06-09 DIAGNOSIS — Z7901 Long term (current) use of anticoagulants: Secondary | ICD-10-CM | POA: Diagnosis not present

## 2023-06-09 DIAGNOSIS — I10 Essential (primary) hypertension: Secondary | ICD-10-CM | POA: Diagnosis not present

## 2023-06-09 DIAGNOSIS — Z1152 Encounter for screening for COVID-19: Secondary | ICD-10-CM | POA: Diagnosis not present

## 2023-06-09 DIAGNOSIS — S0990XA Unspecified injury of head, initial encounter: Secondary | ICD-10-CM | POA: Diagnosis not present

## 2023-06-09 DIAGNOSIS — R0902 Hypoxemia: Secondary | ICD-10-CM | POA: Diagnosis not present

## 2023-06-09 DIAGNOSIS — I4891 Unspecified atrial fibrillation: Secondary | ICD-10-CM | POA: Diagnosis not present

## 2023-06-09 DIAGNOSIS — R0602 Shortness of breath: Secondary | ICD-10-CM | POA: Diagnosis not present

## 2023-06-09 DIAGNOSIS — I2510 Atherosclerotic heart disease of native coronary artery without angina pectoris: Secondary | ICD-10-CM | POA: Diagnosis not present

## 2023-06-09 DIAGNOSIS — E860 Dehydration: Secondary | ICD-10-CM | POA: Diagnosis not present

## 2023-06-09 DIAGNOSIS — F17210 Nicotine dependence, cigarettes, uncomplicated: Secondary | ICD-10-CM | POA: Diagnosis not present

## 2023-06-09 DIAGNOSIS — Z79899 Other long term (current) drug therapy: Secondary | ICD-10-CM | POA: Diagnosis not present

## 2023-06-09 DIAGNOSIS — R0989 Other specified symptoms and signs involving the circulatory and respiratory systems: Secondary | ICD-10-CM | POA: Diagnosis not present

## 2023-06-09 DIAGNOSIS — Z89422 Acquired absence of other left toe(s): Secondary | ICD-10-CM | POA: Diagnosis not present

## 2023-06-09 LAB — CBC
HCT: 38.5 % — ABNORMAL LOW (ref 39.0–52.0)
Hemoglobin: 13.2 g/dL (ref 13.0–17.0)
MCH: 34.1 pg — ABNORMAL HIGH (ref 26.0–34.0)
MCHC: 34.3 g/dL (ref 30.0–36.0)
MCV: 99.5 fL (ref 80.0–100.0)
Platelets: 126 10*3/uL — ABNORMAL LOW (ref 150–400)
RBC: 3.87 MIL/uL — ABNORMAL LOW (ref 4.22–5.81)
RDW: 14.4 % (ref 11.5–15.5)
WBC: 7.1 10*3/uL (ref 4.0–10.5)
nRBC: 0 % (ref 0.0–0.2)

## 2023-06-09 LAB — BASIC METABOLIC PANEL
Anion gap: 11 (ref 5–15)
BUN: 21 mg/dL (ref 8–23)
CO2: 23 mmol/L (ref 22–32)
Calcium: 8.2 mg/dL — ABNORMAL LOW (ref 8.9–10.3)
Chloride: 101 mmol/L (ref 98–111)
Creatinine, Ser: 0.91 mg/dL (ref 0.61–1.24)
GFR, Estimated: >60 mL/min (ref >60)
Glucose, Bld: 137 mg/dL — ABNORMAL HIGH (ref 70–99)
Potassium: 4.3 mmol/L (ref 3.5–5.1)
Sodium: 135 mmol/L (ref 135–145)

## 2023-06-09 MED ORDER — TRELEGY ELLIPTA 100-62.5-25 MCG/ACT IN AEPB: 1.0000 | INHALATION_SPRAY | Freq: Every day | RESPIRATORY_TRACT | 2 refills | Status: DC

## 2023-06-09 NOTE — ED Notes (Signed)
Provided water and crackers. 

## 2023-06-09 NOTE — Care Management Obs Status (Signed)
 MEDICARE OBSERVATION STATUS NOTIFICATION   Patient Details  Name: Karl Myers MRN: [id number] Date of Birth: 1945-10-15   Medicare Observation Status Notification Given:  Yes    Margarito Liner, LCSW 06/09/2023, 10:27 AM

## 2023-06-09 NOTE — Discharge Summary (Signed)
 Physician Discharge Summary   Karl Myers FMW:[id number] DOB: 1945-08-24 DOA: 06/08/2023  PCP: Maribeth Camellia MATSU, MD  Admit date: 06/08/2023 Discharge date: 06/09/2023  Admitted From: Home Disposition:  Home Discharging physician: Alm Apo, MD Barriers to discharge: none  Recommendations at discharge: Continue chronic medical management    Discharge Condition: stable CODE STATUS: Full Diet recommendation:  Diet Orders (From admission, onward)     Start     Ordered   06/09/23 0000  Diet - low sodium heart healthy        06/09/23 0948   06/08/23 1257  Diet Heart Room service appropriate? Yes; Fluid consistency: Thin  Diet effective now       Question Answer Comment  Room service appropriate? Yes   Fluid consistency: Thin      06/08/23 1258            Hospital Course: [name] is a 78 year old male with history of COPD, ongoing tobacco use, HTN, history of melanoma, who presented to the ER with shortness of breath, cough, weakness. Workup was positive for COVID-19 infection.  He was briefly started on oxygen for oxygen saturation 88% on admission.  He was able to be weaned back to room air and ambulated with lowest desaturation 91% prior to discharge. He was strongly recommended to refrain from ongoing smoking at discharge.  Due to quick subjective improvement, he was comfortable with discharging home for ongoing supportive care at home. He had not been using his Trelegy inhaler and this was refilled at discharge as well.  Assessment and Plan: * COPD exacerbation (HCC) Suspect secondary to COVID-19 infection Status post DuoNebs 9 mL Nebulizer one-time dose ordered DuoNebs 4 times daily, 3 doses ordered on admission Solu-Medrol  125 mg IV daily, 2 doses ordered on admission Continuous pulse oximetry Maintain SpO2 greater than 92% Admit to telemetry medical, inpatient  COVID-19 virus infection Treat per above  Weakness Spouse is concerned that  patient may not be able to walk or take care of himself if he goes home.  And she is by herself with him. PT, OT consulted Fall precautions  Acute hypoxic respiratory failure (HCC) Presume multifactorial in setting of COVID-19 infection causing COPD exacerbation Continue oxygen supplementation to maintain SpO2 greater than 92% Continuous pulse oximetry ordered on admission  Depression With anxiety Home trazodone  150 mg nightly resumed Alprazolam  0.5 mg nightly as needed for anxiety, sleep ordered  Atrial fibrillation (HCC) Home atenolol  25 mg nightly resumed Eliquis  5 mg p.o. twice daily resumed on admission  CAD (coronary artery disease) Atorvastatin  80 mg nightly resumed, nitroglycerin  sublingual every 5 minutes as needed for chest pain ordered  Tobacco abuse As needed nicotine  patch ordered  Sleeping difficulty Trazodone  150 mg nightly and alprazolam  0.5 mg nightly as needed for anxiety, sleep resumed on admission  Hyperlipidemia Home atorvastatin  80 mg nightly resumed on admission  Hypertension Hydralazine  5 mg IV every 6 hours as needed for SBP > 160, 5 days ordered   The patient's acute and chronic medical conditions were treated accordingly. On day of discharge, patient was felt deemed stable for discharge. Patient/family member advised to call PCP or come back to ER if needed.   Principal Diagnosis: COPD exacerbation [facility])  Discharge Diagnoses: Active Hospital Problems   Diagnosis Date Noted   COPD exacerbation (HCC) 06/08/2023   COVID-19 06/09/2023   Depression 06/08/2023   Acute hypoxic respiratory failure (HCC) 06/08/2023   Weakness 06/08/2023   COVID-19 virus infection 06/08/2023   PAD (  peripheral artery disease) (HCC) 04/09/2023   History of melanoma 10/13/2019   Atrial fibrillation (HCC) 08/01/2019   COPD (chronic obstructive pulmonary disease) (HCC)    Anxiety    CAD (coronary artery disease) 07/27/2017   Tobacco abuse 06/24/2017   Hyperlipidemia  03/12/2017   Hypertension 03/12/2017   Sleeping difficulty 03/12/2017    Resolved Hospital Problems  No resolved problems to display.     Discharge Instructions     Diet - low sodium heart healthy   Complete by: As directed    Increase activity slowly   Complete by: As directed       Allergies as of 06/09/2023       Reactions   Hydrocodone-acetaminophen  Other (See Comments)   With large quantities hyper   Neurontin  [gabapentin ] Other (See Comments)   With large quantities hyper        Medication List     TAKE these medications    ALPRAZolam  0.5 MG tablet Commonly known as: XANAX  TAKE 1 TABLET BY MOUTH AT BEDTIME AS NEEDED FOR ANXIETY.   atenolol  25 MG tablet Commonly known as: TENORMIN  Take 1 tablet (25 mg total) by mouth at bedtime.   atorvastatin  80 MG tablet Commonly known as: LIPITOR TAKE 1 TABLET BY MOUTH DAILY   cyanocobalamin  1000 MCG tablet Commonly known as: VITAMIN B12 Take 1 tablet (1,000 mcg total) by mouth daily.   Eliquis  5 MG Tabs tablet Generic drug: apixaban  TAKE ONE TABLET BY MOUTH TWICE DAILY   gabapentin  100 MG capsule Commonly known as: NEURONTIN  TAKE 1 CAPSULE BY MOUTH 3 TIMES DAILY. What changed: when to take this   nitroGLYCERIN  0.4 MG SL tablet Commonly known as: NITROSTAT  Place 1 tablet (0.4 mg total) under the tongue every 5 (five) minutes as needed for chest pain. For up to 3 doses per episode.   saccharomyces boulardii 250 MG capsule Commonly known as: Florastor Take 1 capsule (250 mg total) by mouth daily.   traMADol  50 MG tablet Commonly known as: ULTRAM  Take 50 mg by mouth 2 (two) times daily as needed for moderate pain (pain score 4-6) or severe pain (pain score 7-10).   traZODone  150 MG tablet Commonly known as: DESYREL  Take 1 tablet (150 mg total) by mouth at bedtime.   Trelegy Ellipta  100-62.5-25 MCG/ACT Aepb Generic drug: Fluticasone -Umeclidin-Vilant Inhale 1 puff into the lungs daily.   valsartan  320  MG tablet Commonly known as: DIOVAN  Take 1 tablet (320 mg total) by mouth daily.        Allergies  Allergen Reactions   Hydrocodone-Acetaminophen  Other (See Comments)    With large quantities hyper   Neurontin  [Gabapentin ] Other (See Comments)    With large quantities hyper     Consultations:   Procedures:   Discharge Exam: BP 129/63   Pulse 64   Temp 98.5 F (36.9 C) (Oral)   Resp 18   Ht 5' 8 (1.727 m)   Wt 81.6 kg   SpO2 95%   BMI 27.37 kg/m  Physical Exam Constitutional:      General: He is not in acute distress.    Appearance: Normal appearance.  HENT:     Head: Normocephalic and atraumatic.     Mouth/Throat:     Mouth: Mucous membranes are moist.  Eyes:     Extraocular Movements: Extraocular movements intact.  Cardiovascular:     Rate and Rhythm: Normal rate and regular rhythm.  Pulmonary:     Effort: Pulmonary effort is normal. No respiratory distress.  Breath sounds: Normal breath sounds. No wheezing.  Abdominal:     General: Bowel sounds are normal. There is no distension.     Palpations: Abdomen is soft.     Tenderness: There is no abdominal tenderness.  Musculoskeletal:        General: Normal range of motion.     Cervical back: Normal range of motion and neck supple.  Skin:    General: Skin is warm and dry.  Neurological:     General: No focal deficit present.     Mental Status: He is alert.  Psychiatric:        Mood and Affect: Mood normal.        Behavior: Behavior normal.      The results of significant diagnostics from this hospitalization (including imaging, microbiology, ancillary and laboratory) are listed below for reference.   Microbiology: Recent Results (from the past 240 hours)  Resp panel by RT-PCR (RSV, Flu A&B, Covid) Anterior Nasal Swab     Status: Abnormal   Collection Time: 06/08/23  3:41 AM   Specimen: Anterior Nasal Swab  Result Value Ref Range Status   SARS Coronavirus 2 by RT PCR POSITIVE (A) NEGATIVE  Final    Comment: (NOTE) SARS-CoV-2 target nucleic acids are DETECTED.  The SARS-CoV-2 RNA is generally detectable in upper respiratory specimens during the acute phase of infection. Positive results are indicative of the presence of the identified virus, but do not rule out bacterial infection or co-infection with other pathogens not detected by the test. Clinical correlation with patient history and other diagnostic information is necessary to determine patient infection status. The expected result is Negative.  Fact Sheet for Patients: bloggercourse.com  Fact Sheet for Healthcare Providers: seriousbroker.it  This test is not yet approved or cleared by the [place]  FDA and  has been authorized for detection and/or diagnosis of SARS-CoV-2 by FDA under an Emergency Use Authorization (EUA).  This EUA will remain in effect (meaning this test can be used) for the duration of  the COVID-19 declaration under Section 564(b)(1) of the A ct, 21 U.S.C. section 360bbb-3(b)(1), unless the authorization is terminated or revoked sooner.     Influenza A by PCR NEGATIVE NEGATIVE Final   Influenza B by PCR NEGATIVE NEGATIVE Final    Comment: (NOTE) The Xpert Xpress SARS-CoV-2/FLU/RSV plus assay is intended as an aid in the diagnosis of influenza from Nasopharyngeal swab specimens and should not be used as a sole basis for treatment. Nasal washings and aspirates are unacceptable for Xpert Xpress SARS-CoV-2/FLU/RSV testing.  Fact Sheet for Patients: bloggercourse.com  Fact Sheet for Healthcare Providers: seriousbroker.it  This test is not yet approved or cleared by the [place]  FDA and has been authorized for detection and/or diagnosis of SARS-CoV-2 by FDA under an Emergency Use Authorization (EUA). This EUA will remain in effect (meaning this test can be used) for the duration of  the COVID-19 declaration under Section 564(b)(1) of the Act, 21 U.S.C. section 360bbb-3(b)(1), unless the authorization is terminated or revoked.     Resp Syncytial Virus by PCR NEGATIVE NEGATIVE Final    Comment: (NOTE) Fact Sheet for Patients: bloggercourse.com  Fact Sheet for Healthcare Providers: seriousbroker.it  This test is not yet approved or cleared by the [place]  FDA and has been authorized for detection and/or diagnosis of SARS-CoV-2 by FDA under an Emergency Use Authorization (EUA). This EUA will remain in effect (meaning this test can be used) for the duration of the COVID-19 declaration under  Section 564(b)(1) of the Act, 21 U.S.C. section 360bbb-3(b)(1), unless the authorization is terminated or revoked.  Performed at [facility], [address] [place], [place] [address]      Labs: BNP (last 3 results) No results for input(s): BNP in the last 8760 hours. Basic Metabolic Panel: Recent Labs  Lab 06/08/23 0341 06/09/23 0428  NA 136 135  K 3.8 4.3  CL 103 101  CO2 25 23  GLUCOSE 143* 137*  BUN 19 21  CREATININE 1.00 0.91  CALCIUM  8.5* 8.2*   Liver Function Tests: Recent Labs  Lab 06/08/23 0341  AST 23  ALT 17  ALKPHOS 76  BILITOT 1.0  PROT 6.5  ALBUMIN 3.9   No results for input(s): LIPASE, AMYLASE in the last 168 hours. No results for input(s): AMMONIA in the last 168 hours. CBC: Recent Labs  Lab 06/08/23 0341 06/09/23 0428  WBC 10.4 7.1  HGB 15.0 13.2  HCT 44.8 38.5*  MCV 101.4* 99.5  PLT 153 126*   Cardiac Enzymes: No results for input(s): CKTOTAL, CKMB, CKMBINDEX, TROPONINI in the last 168 hours. BNP: Invalid input(s): POCBNP CBG: No results for input(s): GLUCAP in the last 168 hours. D-Dimer No results for input(s): DDIMER in the last 72 hours. Hgb A1c No results for input(s): HGBA1C in the last 72 hours. Lipid Profile No results  for input(s): CHOL, HDL, LDLCALC, TRIG, CHOLHDL, LDLDIRECT in the last 72 hours. Thyroid  function studies No results for input(s): TSH, T4TOTAL, T3FREE, THYROIDAB in the last 72 hours.  Invalid input(s): FREET3 Anemia work up No results for input(s): VITAMINB12, FOLATE, FERRITIN, TIBC, IRON, RETICCTPCT in the last 72 hours. Urinalysis    Component Value Date/Time   COLORURINE YELLOW (A) 06/08/2023 1430   APPEARANCEUR CLEAR (A) 06/08/2023 1430   LABSPEC 1.027 06/08/2023 1430   PHURINE 5.0 06/08/2023 1430   GLUCOSEU NEGATIVE 06/08/2023 1430   HGBUR NEGATIVE 06/08/2023 1430   BILIRUBINUR NEGATIVE 06/08/2023 1430   KETONESUR NEGATIVE 06/08/2023 1430   PROTEINUR 100 (A) 06/08/2023 1430   NITRITE NEGATIVE 06/08/2023 1430   LEUKOCYTESUR NEGATIVE 06/08/2023 1430   Sepsis Labs Recent Labs  Lab 06/08/23 0341 06/09/23 0428  WBC 10.4 7.1   Microbiology Recent Results (from the past 240 hours)  Resp panel by RT-PCR (RSV, Flu A&B, Covid) Anterior Nasal Swab     Status: Abnormal   Collection Time: 06/08/23  3:41 AM   Specimen: Anterior Nasal Swab  Result Value Ref Range Status   SARS Coronavirus 2 by RT PCR POSITIVE (A) NEGATIVE Final    Comment: (NOTE) SARS-CoV-2 target nucleic acids are DETECTED.  The SARS-CoV-2 RNA is generally detectable in upper respiratory specimens during the acute phase of infection. Positive results are indicative of the presence of the identified virus, but do not rule out bacterial infection or co-infection with other pathogens not detected by the test. Clinical correlation with patient history and other diagnostic information is necessary to determine patient infection status. The expected result is Negative.  Fact Sheet for Patients: bloggercourse.com  Fact Sheet for Healthcare Providers: seriousbroker.it  This test is not yet approved or cleared by the [place]   FDA and  has been authorized for detection and/or diagnosis of SARS-CoV-2 by FDA under an Emergency Use Authorization (EUA).  This EUA will remain in effect (meaning this test can be used) for the duration of  the COVID-19 declaration under Section 564(b)(1) of the A ct, 21 U.S.C. section 360bbb-3(b)(1), unless the authorization is terminated or revoked sooner.  Influenza A by PCR NEGATIVE NEGATIVE Final   Influenza B by PCR NEGATIVE NEGATIVE Final    Comment: (NOTE) The Xpert Xpress SARS-CoV-2/FLU/RSV plus assay is intended as an aid in the diagnosis of influenza from Nasopharyngeal swab specimens and should not be used as a sole basis for treatment. Nasal washings and aspirates are unacceptable for Xpert Xpress SARS-CoV-2/FLU/RSV testing.  Fact Sheet for Patients: bloggercourse.com  Fact Sheet for Healthcare Providers: seriousbroker.it  This test is not yet approved or cleared by the [place]  FDA and has been authorized for detection and/or diagnosis of SARS-CoV-2 by FDA under an Emergency Use Authorization (EUA). This EUA will remain in effect (meaning this test can be used) for the duration of the COVID-19 declaration under Section 564(b)(1) of the Act, 21 U.S.C. section 360bbb-3(b)(1), unless the authorization is terminated or revoked.     Resp Syncytial Virus by PCR NEGATIVE NEGATIVE Final    Comment: (NOTE) Fact Sheet for Patients: bloggercourse.com  Fact Sheet for Healthcare Providers: seriousbroker.it  This test is not yet approved or cleared by the [place]  FDA and has been authorized for detection and/or diagnosis of SARS-CoV-2 by FDA under an Emergency Use Authorization (EUA). This EUA will remain in effect (meaning this test can be used) for the duration of the COVID-19 declaration under Section 564(b)(1) of the Act, 21 U.S.C. section  360bbb-3(b)(1), unless the authorization is terminated or revoked.  Performed at [facility], [address] [place], [place] [address]     Procedures/Studies: CT Head Wo Contrast Result Date: 06/08/2023 CLINICAL DATA:  Multiple falls, generalized weakness, hypoxia EXAM: CT HEAD WITHOUT CONTRAST TECHNIQUE: Contiguous axial images were obtained from the base of the skull through the vertex without intravenous contrast. RADIATION DOSE REDUCTION: This exam was performed according to the departmental dose-optimization program which includes automated exposure control, adjustment of the mA and/or kV according to patient size and/or use of iterative reconstruction technique. COMPARISON:  07/27/2022, 12/17/2022 FINDINGS: Brain: Chronic small vessel ischemic changes are again noted within the basal ganglia and periventricular white matter, stable. Stable generalized cerebral atrophy. No acute infarct or hemorrhage. Lateral ventricles and remaining midline structures are stable. No acute extra-axial fluid collections. No mass effect. Vascular: No hyperdense vessel or unexpected calcification. Skull: Normal. Negative for fracture or focal lesion. Sinuses/Orbits: Mucous retention cysts or polyps within the anterior ethmoid air cells. Remaining paranasal sinuses are clear. Other: None. IMPRESSION: 1. Stable head CT, no acute intracranial process. Electronically Signed   By: Ozell Daring M.D.   On: 06/08/2023 14:57   DG Chest 2 View Result Date: 06/08/2023 CLINICAL DATA:  Generalized weakness. EXAM: CHEST - 2 VIEW COMPARISON:  May 22, 2021 FINDINGS: The heart size and mediastinal contours are within normal limits. Low lung volumes are noted without evidence of an acute infiltrate, pleural effusion or pneumothorax. Multilevel degenerative changes seen throughout the thoracic spine. IMPRESSION: Low lung volumes without evidence of acute cardiopulmonary disease. Electronically Signed   By: Suzen Dials M.D.   On: 06/08/2023 05:30     Time coordinating discharge: Over 30 minutes    Alm Apo, MD  [facility] 06/09/2023, 12:40 PM

## 2023-06-09 NOTE — ED Notes (Signed)
 Pt placed on cardiac monitor. 02 noted to be 88% pt placed on 2L [place]. Pt assisted with using urinal at bedside. Pt helped back to bed. Respirations even and non labored. Pt encouraged to alert staff to any needs.

## 2023-06-09 NOTE — ED Notes (Signed)
 Pt is eating breakfast

## 2023-06-09 NOTE — Evaluation (Signed)
 Occupational Therapy Evaluation Patient Details Name: Karl Myers MRN: [id number] DOB: 1945-07-09 Today's Date: 06/09/2023   History of Present Illness 78 y/o male presented to ED on 06/08/23 for weakness and falls. Tested positive for COVID. Admitted for COPD exacerbation along with COVID. PMH: COPD, HTN, hx of tobacco use   Clinical Impression   Pt was seen for OT evaluation this date. Prior to hospital admission, pt was independent with ADL and mobility. Pt lives with his spouse. Pt presents to acute OT demonstrating impaired ADL performance and functional mobility 2/2 mild impairments in activity tolerance and mild baseline balance deficits (See OT problem list for additional functional deficits). Pt currently mod indep with ADL mobility in the room, LB dressing, and SpO2 remained >90% on RA throughout. Pt/wife eager to return home. Pt near baseline. Encouraged activity pacing and use of pulse oximeter at home to monitor SpO2. No skilled OT needs at this time.  Will sign off.     If plan is discharge home, recommend the following: Assistance with cooking/housework;Help with stairs or ramp for entrance    Functional Status Assessment  Patient has had a recent decline in their functional status and demonstrates the ability to make significant improvements in function in a reasonable and predictable amount of time.  Equipment Recommendations  None recommended by OT    Recommendations for Other Services       Precautions / Restrictions Precautions Precautions: Fall Precaution Comments: O2 Restrictions Weight Bearing Restrictions Per Provider Order: No      Mobility Bed Mobility Overal bed mobility: Modified Independent                  Transfers Overall transfer level: Modified independent                        Balance Overall balance assessment: History of Falls, Mild deficits observed, not formally tested                                          ADL either performed or assessed with clinical judgement   ADL Overall ADL's : Modified independent                                             Vision         Perception         Praxis         Pertinent Vitals/Pain Pain Assessment Pain Assessment: No/denies pain     Extremity/Trunk Assessment Upper Extremity Assessment Upper Extremity Assessment: Defer to OT evaluation   Lower Extremity Assessment Lower Extremity Assessment: Generalized weakness   Cervical / Trunk Assessment Cervical / Trunk Assessment: Normal   Communication Communication Communication: No apparent difficulties   Cognition Arousal: Alert Behavior During Therapy: WFL for tasks assessed/performed Overall Cognitive Status: Within Functional Limits for tasks assessed                                       General Comments  On RA throuhgout,  able to maintain SpO2 >90% with mobility    Exercises Other Exercises Other Exercises: Pt encouraged to implement activity pacing to ensure  he does not over exert himself. Encouraged checking SpO2 at home.   Shoulder Instructions      Home Living Family/patient expects to be discharged to:: Private residence Living Arrangements: Spouse/significant other Available Help at Discharge: Family;Available 24 hours/day Type of Home: House Home Access: Stairs to enter [employer] of Steps: 1 Entrance Stairs-Rails: Left Home Layout: One level     Bathroom Shower/Tub: Producer, Television/film/video: Handicapped height     Home Equipment: Agricultural Consultant (2 wheels);Shower seat - built in          Prior Functioning/Environment Prior Level of Function : History of Falls (last six months);Driving;Independent/Modified Independent                        OT Problem List: Decreased activity tolerance      OT Treatment/Interventions:      OT Goals(Current goals can be found in the care  plan section) Acute Rehab OT Goals Patient Stated Goal: go home OT Goal Formulation: All assessment and education complete, DC therapy  OT Frequency:      Co-evaluation PT/OT/SLP Co-Evaluation/Treatment: Yes Reason for Co-Treatment: To address functional/ADL transfers PT goals addressed during session: Mobility/safety with mobility OT goals addressed during session: ADL's and self-care      AM-PAC OT 6 Clicks Daily Activity     Outcome Measure Help from another person eating meals?: None Help from another person taking care of personal grooming?: None Help from another person toileting, which includes using toliet, bedpan, or urinal?: None Help from another person bathing (including washing, rinsing, drying)?: None Help from another person to put on and taking off regular upper body clothing?: None Help from another person to put on and taking off regular lower body clothing?: None 6 Click Score: 24   End of Session Nurse Communication: Mobility status  Activity Tolerance: Patient tolerated treatment well Patient left: with call bell/phone within reach;with family/visitor present  OT Visit Diagnosis: Other abnormalities of gait and mobility (R26.89)                Time: 9147-9088 OT Time Calculation (min): 19 min Charges:  OT General Charges $OT Visit: 1 Visit OT Evaluation $OT Eval Low Complexity: 1 Low  Warren SAUNDERS., MPH, MS, OTR/L ascom [phone] 06/09/23, 11:00 AM

## 2023-06-09 NOTE — ED Notes (Signed)
 Pt is on toilet for BM. Wife at bedside.

## 2023-06-09 NOTE — Care Management CC44 (Signed)
 Condition Code 44 Documentation Completed  Patient Details  Name: Guled Gahan MRN: [id number] Date of Birth: January 13, 1946   Condition Code 44 given:  Yes Patient signature on Condition Code 44 notice:  Yes Documentation of 2 MD's agreement:  Yes Code 44 added to claim:  Yes    Lauraine JAYSON Carpen, LCSW 06/09/2023, 10:27 AM

## 2023-06-09 NOTE — ED Notes (Signed)
 Pt ambulated to bedside toilet without incident. Pt to call staff before attempting to stand up.

## 2023-06-09 NOTE — Evaluation (Signed)
 Physical Therapy Evaluation Patient Details Name: Karl Myers MRN: [id number] DOB: 05-10-1946 Today's Date: 06/09/2023  History of Present Illness  78 y/o male presented to ED on 06/08/23 for weakness and falls. Tested positive for COVID. Admitted for COPD exacerbation along with COVID. PMH: COPD, HTN, hx of tobacco use  Clinical Impression  Patient admitted with the above. PTA, patient lives with wife and reports independence with mobility and reports 1 fall in past 6 months leading to this admission. Patient continues to function at modI level with no AD. On RA during session, spO2 maintained >90% during mobility. Patient denies SOB during mobility. Discussed utilizing pulse ox at home more frequently when SOB to monitor spO2. No further skilled PT needs identified acutely. PT will sign off at this time.        Equipment Recommendations None recommended by PT  Recommendations for Other Services       Functional Status Assessment Patient has not had a recent decline in their functional status     Precautions / Restrictions Precautions Precautions: None Precaution Comments: O2      Mobility  Bed Mobility Overal bed mobility: Modified Independent                  Transfers Overall transfer level: Modified independent                      Ambulation/Gait Ambulation/Gait assistance: Modified independent (Device/Increase time) Gait Distance (Feet): 40 Feet Assistive device: None   Gait velocity: decreased     General Gait Details: patient reports slower gait speed at baseline  Stairs            Wheelchair Mobility     Tilt Bed    Modified Rankin (Stroke Patients Only)       Balance Overall balance assessment: History of Falls, Mild deficits observed, not formally tested                                           Pertinent Vitals/Pain Pain Assessment Pain Assessment: No/denies pain    Home Living Family/patient  expects to be discharged to:: Private residence Living Arrangements: Spouse/significant other Available Help at Discharge: Family;Available 24 hours/day Type of Home: House Home Access: Stairs to enter Entrance Stairs-Rails: Left Entrance Stairs-Number of Steps: 1   Home Layout: One level Home Equipment: [employer] (2 wheels);Shower seat - built in      Prior Function Prior Level of Function : History of Falls (last six months);Driving;Independent/Modified Independent                     Extremity/Trunk Assessment   Upper Extremity Assessment Upper Extremity Assessment: Defer to OT evaluation    Lower Extremity Assessment Lower Extremity Assessment: Generalized weakness    Cervical / Trunk Assessment Cervical / Trunk Assessment: Normal  Communication   Communication Communication: No apparent difficulties  Cognition Arousal: Alert Behavior During Therapy: WFL for tasks assessed/performed Overall Cognitive Status: Within Functional Limits for tasks assessed                                          General Comments General comments (skin integrity, edema, etc.): On RA throughout session, able to maintain spO2 >90% during mobility  Exercises     Assessment/Plan    PT Assessment Patient does not need any further PT services  PT Problem List         PT Treatment Interventions      PT Goals (Current goals can be found in the Care Plan section)  Acute Rehab PT Goals Patient Stated Goal: to go home PT Goal Formulation: All assessment and education complete, DC therapy    Frequency       Co-evaluation PT/OT/SLP Co-Evaluation/Treatment: Yes Reason for Co-Treatment: To address functional/ADL transfers PT goals addressed during session: Mobility/safety with mobility         AM-PAC PT 6 Clicks Mobility  Outcome Measure Help needed turning from your back to your side while in a flat bed without using bedrails?: None Help needed  moving from lying on your back to sitting on the side of a flat bed without using bedrails?: None Help needed moving to and from a bed to a chair (including a wheelchair)?: None Help needed standing up from a chair using your arms (e.g., wheelchair or bedside chair)?: None Help needed to walk in hospital room?: None Help needed climbing 3-5 steps with a railing? : None 6 Click Score: 24    End of Session Equipment Utilized During Treatment: Oxygen Activity Tolerance: Patient tolerated treatment well Patient left: in bed;with call bell/phone within reach;with family/visitor present Nurse Communication: Mobility status PT Visit Diagnosis: Muscle weakness (generalized) (M62.81)    Time: 9147-9087 PT Time Calculation (min) (ACUTE ONLY): 20 min   Charges:   PT Evaluation $PT Eval Low Complexity: 1 Low   PT General Charges $$ ACUTE PT VISIT: 1 Visit         Maryanne Finder, PT, DPT Physical Therapist - [facility]  [facility]   Miki Labuda A Verline Kong 06/09/2023, 10:11 AM

## 2023-06-09 NOTE — ED Notes (Signed)
 Pt walked with PT on RA (they repositioned pulse ox probe) and remained 91% and above while walking.

## 2023-06-09 NOTE — ED Notes (Signed)
 Upon arrival to room 9 patient placed in hospital bed to increase comfort. Daughter at bedside. CB within reach. Pt denies any needs at this time.

## 2023-06-10 ENCOUNTER — Telehealth: Payer: Self-pay

## 2023-06-10 ENCOUNTER — Encounter (INDEPENDENT_AMBULATORY_CARE_PROVIDER_SITE_OTHER): Payer: Medicare HMO | Admitting: Nurse Practitioner

## 2023-06-10 ENCOUNTER — Encounter (INDEPENDENT_AMBULATORY_CARE_PROVIDER_SITE_OTHER): Payer: Self-pay

## 2023-06-10 NOTE — Transitions of Care (Post Inpatient/ED Visit) (Signed)
 06/10/2023  Name: Karl Myers MRN: [id number] DOB: 08-13-1945  Today's TOC FU Call Status: Today's TOC FU Call Status:: Successful TOC FU Call Completed TOC FU Call Complete Date: 06/10/23 Patient's Name and Date of Birth confirmed.  Transition Care Management Follow-up Telephone Call Date of Discharge: 06/09/23 Discharge Facility: [facility]) Type of Discharge: Inpatient Admission Primary Inpatient Discharge Diagnosis:: COPD exacerbation, Covid, traumatic injury to head How have you been since you were released from the hospital?: Better Any questions or concerns?: No  Items Reviewed: Did you receive and understand the discharge instructions provided?: Yes Medications obtained,verified, and reconciled?: Yes (Medications Reviewed) Any new allergies since your discharge?: No Dietary orders reviewed?: Yes Type of Diet Ordered:: heart healthy low sodium Do you have support at home?: Yes People in Home: spouse  Medications Reviewed Today: Medications Reviewed Today     Reviewed by Shameer Molstad, [place] LABOR, CMA (Certified Medical Assistant) on 06/10/23 at 1040  Med List Status: <None>   Medication Order Taking? Sig Documenting Provider Last Dose Status Informant  ALPRAZolam  (XANAX ) 0.5 MG tablet [id number] No TAKE 1 TABLET BY MOUTH AT BEDTIME AS NEEDED FOR ANXIETY. Maribeth Camellia MATSU, MD 06/07/2023 Active Self, Spouse/Significant Other, Pharmacy Records  apixaban  (ELIQUIS ) 5 MG TABS tablet [id number] No TAKE ONE TABLET BY MOUTH TWICE DAILY Gollan, Timothy J, MD 06/07/2023  9:00 PM Active Self, Spouse/Significant Other, Pharmacy Records  atenolol  (TENORMIN ) 25 MG tablet [id number] No Take 1 tablet (25 mg total) by mouth at bedtime. Maribeth Camellia MATSU, MD 06/07/2023 Active Self, Spouse/Significant Other, Pharmacy Records  atorvastatin  (LIPITOR) 80 MG tablet [id number] No TAKE 1 TABLET BY MOUTH DAILY Maribeth Camellia MATSU, MD 06/07/2023 Active Self, Spouse/Significant  Other, Pharmacy Records  cyanocobalamin  (VITAMIN B12) 1000 MCG tablet [id number] No Take 1 tablet (1,000 mcg total) by mouth daily. Babara Call, MD 06/07/2023 Active Self, Spouse/Significant Other, Pharmacy Records  Fluticasone -Umeclidin-Vilant (TRELEGY ELLIPTA ) 100-62.5-25 MCG/ACT AEPB [id number]  Inhale 1 puff into the lungs daily. Patsy Lenis, MD  Active   gabapentin  (NEURONTIN ) 100 MG capsule [id number] No TAKE 1 CAPSULE BY MOUTH 3 TIMES DAILY.  Patient taking differently: Take 100 mg by mouth at bedtime.   Maribeth Camellia MATSU, MD 06/07/2023 Active Self, Spouse/Significant Other, Pharmacy Records           Med Note [facility]   Tue Jun 08, 2023  1:19 PM) Patient usually just takes one capsule at bedtime- if he takes anymore he starts getting confused  nitroGLYCERIN  (NITROSTAT ) 0.4 MG SL tablet [id number] No Place 1 tablet (0.4 mg total) under the tongue every 5 (five) minutes as needed for chest pain. For up to 3 doses per episode. Maribeth Camellia MATSU, MD Taking Active Self, Spouse/Significant Other, Pharmacy Records  saccharomyces boulardii [facility]) 250 MG capsule [id number] No Take 1 capsule (250 mg total) by mouth daily. Hope Merle, MD 06/07/2023 Active Self, Spouse/Significant Other, Pharmacy Records  traMADol  (ULTRAM ) 50 MG tablet [id number] No Take 50 mg by mouth 2 (two) times daily as needed for moderate pain (pain score 4-6) or severe pain (pain score 7-10). [provider] Past Week Active Self, Spouse/Significant Other, Pharmacy Records  traZODone  (DESYREL ) 150 MG tablet [id number] No Take 1 tablet (150 mg total) by mouth at bedtime. Maribeth Camellia MATSU, MD 06/07/2023 Active Self, Spouse/Significant Other, Pharmacy Records  valsartan  (DIOVAN ) 320 MG tablet [id number] No Take 1 tablet (320 mg total) by mouth daily. Maribeth Camellia MATSU, MD 06/07/2023 Active Self, Spouse/Significant Other, Pharmacy Records  Home Care and Equipment/Supplies: Were Home Health Services  Ordered?: NA Any new equipment or medical supplies ordered?: NA  Functional Questionnaire: Do you need assistance with bathing/showering or dressing?: No Do you need assistance with meal preparation?: No Do you need assistance with eating?: No Do you have difficulty maintaining continence: No Do you need assistance with getting out of bed/getting out of a chair/moving?: No Do you have difficulty managing or taking your medications?: No  Follow up appointments reviewed: PCP Follow-up appointment confirmed?: No MD Provider Line Number:[phone] Given: Yes (message sent to scheduling) Specialist Hospital Follow-up appointment confirmed?: NA Do you need transportation to your follow-up appointment?: No Do you understand care options if your condition(s) worsen?: Yes-patient verbalized understanding    Darsh Vandevoort, CMA  [facility] AWV Team Direct Dial: [phone]

## 2023-06-11 ENCOUNTER — Telehealth: Payer: Self-pay | Admitting: Family Medicine

## 2023-06-11 NOTE — Telephone Encounter (Signed)
 Patient's wife was in the office today for a visit and noted that the patient was having some issues at night since being home from hospitalization for COVID-19.  Reports some confusion in the middle of the night.  I encouraged her to have him looked at prior to our visit next week if he continues to have these issues.

## 2023-06-13 ENCOUNTER — Inpatient Hospital Stay
Admission: RE | Admit: 2023-06-13 | Discharge: 2023-06-13 | Disposition: A | Discharge status: Home / Self Care | Payer: Self-pay | Source: Ambulatory Visit

## 2023-06-13 DIAGNOSIS — Z885 Allergy status to narcotic agent status: Secondary | ICD-10-CM | POA: Diagnosis not present

## 2023-06-13 DIAGNOSIS — Z91199 Patient's noncompliance with other medical treatment and regimen due to unspecified reason: Secondary | ICD-10-CM | POA: Diagnosis not present

## 2023-06-13 DIAGNOSIS — I2510 Atherosclerotic heart disease of native coronary artery without angina pectoris: Secondary | ICD-10-CM | POA: Diagnosis not present

## 2023-06-13 DIAGNOSIS — I4891 Unspecified atrial fibrillation: Secondary | ICD-10-CM | POA: Diagnosis not present

## 2023-06-13 DIAGNOSIS — Z7901 Long term (current) use of anticoagulants: Secondary | ICD-10-CM | POA: Diagnosis not present

## 2023-06-13 DIAGNOSIS — J189 Pneumonia, unspecified organism: Secondary | ICD-10-CM | POA: Diagnosis not present

## 2023-06-13 DIAGNOSIS — J440 Chronic obstructive pulmonary disease with acute lower respiratory infection: Secondary | ICD-10-CM | POA: Diagnosis not present

## 2023-06-13 DIAGNOSIS — R4182 Altered mental status, unspecified: Secondary | ICD-10-CM | POA: Diagnosis not present

## 2023-06-13 DIAGNOSIS — G4700 Insomnia, unspecified: Secondary | ICD-10-CM | POA: Diagnosis not present

## 2023-06-13 DIAGNOSIS — J9811 Atelectasis: Secondary | ICD-10-CM | POA: Diagnosis not present

## 2023-06-13 DIAGNOSIS — F419 Anxiety disorder, unspecified: Secondary | ICD-10-CM | POA: Diagnosis not present

## 2023-06-13 DIAGNOSIS — Z79899 Other long term (current) drug therapy: Secondary | ICD-10-CM | POA: Diagnosis not present

## 2023-06-13 DIAGNOSIS — E785 Hyperlipidemia, unspecified: Secondary | ICD-10-CM | POA: Diagnosis not present

## 2023-06-13 DIAGNOSIS — I119 Hypertensive heart disease without heart failure: Secondary | ICD-10-CM | POA: Diagnosis not present

## 2023-06-13 DIAGNOSIS — U071 COVID-19: Secondary | ICD-10-CM | POA: Diagnosis not present

## 2023-06-13 DIAGNOSIS — F17210 Nicotine dependence, cigarettes, uncomplicated: Secondary | ICD-10-CM | POA: Diagnosis not present

## 2023-06-13 DIAGNOSIS — R410 Disorientation, unspecified: Secondary | ICD-10-CM | POA: Diagnosis not present

## 2023-06-14 DIAGNOSIS — F17210 Nicotine dependence, cigarettes, uncomplicated: Secondary | ICD-10-CM | POA: Diagnosis not present

## 2023-06-14 DIAGNOSIS — U071 COVID-19: Secondary | ICD-10-CM | POA: Diagnosis not present

## 2023-06-14 DIAGNOSIS — R410 Disorientation, unspecified: Secondary | ICD-10-CM | POA: Diagnosis not present

## 2023-06-14 DIAGNOSIS — R059 Cough, unspecified: Secondary | ICD-10-CM | POA: Diagnosis not present

## 2023-06-15 ENCOUNTER — Ambulatory Visit (INDEPENDENT_AMBULATORY_CARE_PROVIDER_SITE_OTHER): Payer: Medicare Other | Admitting: Family Medicine

## 2023-06-15 VITALS — BP 130/76 | HR 49 | Temp 97.6°F | Ht 68.0 in | Wt 159.2 lb

## 2023-06-15 DIAGNOSIS — U071 COVID-19: Secondary | ICD-10-CM | POA: Diagnosis not present

## 2023-06-15 DIAGNOSIS — J189 Pneumonia, unspecified organism: Secondary | ICD-10-CM | POA: Diagnosis not present

## 2023-06-15 HISTORY — DX: Pneumonia, unspecified organism: J18.9

## 2023-06-15 NOTE — Assessment & Plan Note (Signed)
 Progressively improving.  Patient will complete his course of azithromycin and Augmentin.  He will have a repeat chest x-ray in about a month.  If he has worsening symptoms he will let us know.

## 2023-06-15 NOTE — Progress Notes (Signed)
 Camellia Her, MD Phone: [phone]  Karl Myers is a 78 y.o. male who presents today for f/u.  Pneumonia/COVID-19: Patient recently diagnosed with COVID-19.  Was initially evaluated in the emergency department after having a fall and hitting his head.  He tested positive for COVID at that time.  Negative CT of his head.  He was discharged without Paxlovid.  He had persistent symptoms and presented to an outside hospital and was admitted.  They started him on Paxlovid and treated him for pneumonia with azithromycin  and Augmentin .  Notes since discharge yesterday his cough is somewhat better.  He notes some head and chest congestion.  Some shortness of breath.  No fevers.  He is sleeping better than he was.  Social History   Tobacco Use  Smoking Status Every Day   Current packs/day: 0.25   Average packs/day: 0.3 packs/day for 65.0 years (16.3 ttl pk-yrs)   Types: Cigars, Cigarettes  Smokeless Tobacco Never  Tobacco Comments   0.5PPD 12/07/2022    Current Outpatient Medications on File Prior to Visit  Medication Sig Dispense Refill   amoxicillin -clavulanate (AUGMENTIN ) 875-125 MG tablet Take 1 tablet by mouth.     azithromycin  (ZITHROMAX ) 500 MG tablet Take 500 mg by mouth.     fluticasone  furoate-vilanterol (BREO ELLIPTA ) 200-25 MCG/ACT AEPB Inhale into the lungs.     guaiFENesin  (SCOT-TUSSIN EXPECTORANT) 100 MG/5ML liquid Take by mouth.     nirmatrelvir/ritonavir (PAXLOVID) 20 x 150 MG & 10 x 100MG  TBPK Complete 8 more doses of the package. 3 tablets in the morning and 3 tablets at night.     umeclidinium bromide  (INCRUSE ELLIPTA ) 62.5 MCG/ACT AEPB Inhale into the lungs.     ALPRAZolam  (XANAX ) 0.5 MG tablet TAKE 1 TABLET BY MOUTH AT BEDTIME AS NEEDED FOR ANXIETY. 30 tablet 1   apixaban  (ELIQUIS ) 5 MG TABS tablet TAKE ONE TABLET BY MOUTH TWICE DAILY 180 tablet 1   atenolol  (TENORMIN ) 25 MG tablet Take 1 tablet (25 mg total) by mouth at bedtime. 90 tablet 3   atorvastatin   (LIPITOR) 80 MG tablet TAKE 1 TABLET BY MOUTH DAILY 90 tablet 1   cyanocobalamin  (VITAMIN B12) 1000 MCG tablet Take 1 tablet (1,000 mcg total) by mouth daily. 90 tablet 1   Fluticasone -Umeclidin-Vilant (TRELEGY ELLIPTA ) 100-62.5-25 MCG/ACT AEPB Inhale 1 puff into the lungs daily. 28 each 2   gabapentin  (NEURONTIN ) 100 MG capsule TAKE 1 CAPSULE BY MOUTH 3 TIMES DAILY. (Patient taking differently: Take 100 mg by mouth at bedtime.) 270 capsule 1   nitroGLYCERIN  (NITROSTAT ) 0.4 MG SL tablet Place 1 tablet (0.4 mg total) under the tongue every 5 (five) minutes as needed for chest pain. For up to 3 doses per episode. 50 tablet 0   saccharomyces boulardii (FLORASTOR) 250 MG capsule Take 1 capsule (250 mg total) by mouth daily. 90 capsule 0   traMADol  (ULTRAM ) 50 MG tablet Take 50 mg by mouth 2 (two) times daily as needed for moderate pain (pain score 4-6) or severe pain (pain score 7-10).     traZODone  (DESYREL ) 150 MG tablet Take 1 tablet (150 mg total) by mouth at bedtime. 90 tablet 1   valsartan  (DIOVAN ) 320 MG tablet Take 1 tablet (320 mg total) by mouth daily. 90 tablet 1   No current facility-administered medications on file prior to visit.     ROS see history of present illness  Objective  Physical Exam Vitals:   06/15/23 1049  BP: 130/76  Pulse: (!) 49  Temp: 97.6  F (36.4 C)  SpO2: 96%    BP Readings from Last 3 Encounters:  06/15/23 130/76  06/09/23 (!) 108/91  05/12/23 138/67   Wt Readings from Last 3 Encounters:  06/15/23 159 lb 3.2 oz (72.2 kg)  06/08/23 180 lb (81.6 kg)  05/12/23 161 lb (73 kg)    Physical Exam Constitutional:      General: He is not in acute distress.    Appearance: He is not diaphoretic.  Cardiovascular:     Rate and Rhythm: Normal rate and regular rhythm.     Heart sounds: Normal heart sounds.  Pulmonary:     Effort: Pulmonary effort is normal.     Breath sounds: Normal breath sounds.  Skin:    General: Skin is warm and dry.  Neurological:      Mental Status: He is alert.      Assessment/Plan: Please see individual problem list.  Pneumonia due to infectious organism, unspecified laterality, unspecified part of lung Assessment & Plan: Progressively improving.  Patient will complete his course of azithromycin  and Augmentin .  He will have a repeat chest x-ray in about a month.  If he has worsening symptoms he will let us  know.  Orders: -     DG Chest 2 View; Future  COVID-19 Assessment & Plan: Progressively improving.  Patient will finish off his course of Paxlovid.  [facility] advised he hold his Xanax , Lipitor, and gabapentin  until he finishes the Paxlovid.  If he has worsening symptoms he will let us  know.     Return in about 1 month (around 07/16/2023) for CXR.   Camellia Her, MD [facility]

## 2023-06-15 NOTE — Assessment & Plan Note (Signed)
 Progressively improving.  Patient will finish off his course of Paxlovid.  [facility] advised he hold his Xanax, Lipitor, and gabapentin until he finishes the Paxlovid.  If he has worsening symptoms he will let us know.

## 2023-06-21 ENCOUNTER — Telehealth: Payer: Self-pay

## 2023-06-21 DIAGNOSIS — Z89421 Acquired absence of other right toe(s): Secondary | ICD-10-CM | POA: Diagnosis not present

## 2023-06-21 DIAGNOSIS — Z7901 Long term (current) use of anticoagulants: Secondary | ICD-10-CM | POA: Diagnosis not present

## 2023-06-21 DIAGNOSIS — F419 Anxiety disorder, unspecified: Secondary | ICD-10-CM | POA: Diagnosis not present

## 2023-06-21 DIAGNOSIS — J440 Chronic obstructive pulmonary disease with acute lower respiratory infection: Secondary | ICD-10-CM | POA: Diagnosis not present

## 2023-06-21 DIAGNOSIS — J441 Chronic obstructive pulmonary disease with (acute) exacerbation: Secondary | ICD-10-CM | POA: Diagnosis not present

## 2023-06-21 DIAGNOSIS — J1282 Pneumonia due to coronavirus disease 2019: Secondary | ICD-10-CM | POA: Diagnosis not present

## 2023-06-21 DIAGNOSIS — Z853 Personal history of malignant neoplasm of breast: Secondary | ICD-10-CM | POA: Diagnosis not present

## 2023-06-21 DIAGNOSIS — F32A Depression, unspecified: Secondary | ICD-10-CM | POA: Diagnosis not present

## 2023-06-21 DIAGNOSIS — F17210 Nicotine dependence, cigarettes, uncomplicated: Secondary | ICD-10-CM | POA: Diagnosis not present

## 2023-06-21 DIAGNOSIS — Z7951 Long term (current) use of inhaled steroids: Secondary | ICD-10-CM | POA: Diagnosis not present

## 2023-06-21 DIAGNOSIS — G4700 Insomnia, unspecified: Secondary | ICD-10-CM | POA: Diagnosis not present

## 2023-06-21 DIAGNOSIS — U071 COVID-19: Secondary | ICD-10-CM | POA: Diagnosis not present

## 2023-06-21 DIAGNOSIS — Z9181 History of falling: Secondary | ICD-10-CM | POA: Diagnosis not present

## 2023-06-21 DIAGNOSIS — I4891 Unspecified atrial fibrillation: Secondary | ICD-10-CM | POA: Diagnosis not present

## 2023-06-21 DIAGNOSIS — E785 Hyperlipidemia, unspecified: Secondary | ICD-10-CM | POA: Diagnosis not present

## 2023-06-21 DIAGNOSIS — I739 Peripheral vascular disease, unspecified: Secondary | ICD-10-CM | POA: Diagnosis not present

## 2023-06-21 DIAGNOSIS — I10 Essential (primary) hypertension: Secondary | ICD-10-CM | POA: Diagnosis not present

## 2023-06-21 DIAGNOSIS — I2510 Atherosclerotic heart disease of native coronary artery without angina pectoris: Secondary | ICD-10-CM | POA: Diagnosis not present

## 2023-06-21 DIAGNOSIS — J9811 Atelectasis: Secondary | ICD-10-CM | POA: Diagnosis not present

## 2023-06-21 NOTE — Telephone Encounter (Signed)
 Copied from CRM [id number]. Topic: General - Other >> Jun 21, 2023  4:22 PM Larwance Sachs wrote: Reason for CRM: Florentina Addison called to advise Dr. Birdie Sons that she saw patient today for home health PT and plans to follow to week 4 and OT to evaluate

## 2023-06-22 NOTE — Telephone Encounter (Signed)
 Noted.

## 2023-06-23 DIAGNOSIS — I739 Peripheral vascular disease, unspecified: Secondary | ICD-10-CM | POA: Diagnosis not present

## 2023-06-23 DIAGNOSIS — I10 Essential (primary) hypertension: Secondary | ICD-10-CM | POA: Diagnosis not present

## 2023-06-23 DIAGNOSIS — F17210 Nicotine dependence, cigarettes, uncomplicated: Secondary | ICD-10-CM | POA: Diagnosis not present

## 2023-06-23 DIAGNOSIS — J440 Chronic obstructive pulmonary disease with acute lower respiratory infection: Secondary | ICD-10-CM | POA: Diagnosis not present

## 2023-06-23 DIAGNOSIS — F419 Anxiety disorder, unspecified: Secondary | ICD-10-CM | POA: Diagnosis not present

## 2023-06-23 DIAGNOSIS — J9811 Atelectasis: Secondary | ICD-10-CM | POA: Diagnosis not present

## 2023-06-23 DIAGNOSIS — Z7951 Long term (current) use of inhaled steroids: Secondary | ICD-10-CM | POA: Diagnosis not present

## 2023-06-23 DIAGNOSIS — E785 Hyperlipidemia, unspecified: Secondary | ICD-10-CM | POA: Diagnosis not present

## 2023-06-23 DIAGNOSIS — U071 COVID-19: Secondary | ICD-10-CM | POA: Diagnosis not present

## 2023-06-23 DIAGNOSIS — F32A Depression, unspecified: Secondary | ICD-10-CM | POA: Diagnosis not present

## 2023-06-23 DIAGNOSIS — I2510 Atherosclerotic heart disease of native coronary artery without angina pectoris: Secondary | ICD-10-CM | POA: Diagnosis not present

## 2023-06-23 DIAGNOSIS — J1282 Pneumonia due to coronavirus disease 2019: Secondary | ICD-10-CM | POA: Diagnosis not present

## 2023-06-23 DIAGNOSIS — J441 Chronic obstructive pulmonary disease with (acute) exacerbation: Secondary | ICD-10-CM | POA: Diagnosis not present

## 2023-06-23 DIAGNOSIS — Z7901 Long term (current) use of anticoagulants: Secondary | ICD-10-CM | POA: Diagnosis not present

## 2023-06-23 DIAGNOSIS — G4700 Insomnia, unspecified: Secondary | ICD-10-CM | POA: Diagnosis not present

## 2023-06-23 DIAGNOSIS — I4891 Unspecified atrial fibrillation: Secondary | ICD-10-CM | POA: Diagnosis not present

## 2023-06-24 DIAGNOSIS — E785 Hyperlipidemia, unspecified: Secondary | ICD-10-CM | POA: Diagnosis not present

## 2023-06-24 DIAGNOSIS — I4891 Unspecified atrial fibrillation: Secondary | ICD-10-CM | POA: Diagnosis not present

## 2023-06-24 DIAGNOSIS — J9811 Atelectasis: Secondary | ICD-10-CM | POA: Diagnosis not present

## 2023-06-24 DIAGNOSIS — F17210 Nicotine dependence, cigarettes, uncomplicated: Secondary | ICD-10-CM | POA: Diagnosis not present

## 2023-06-24 DIAGNOSIS — G4700 Insomnia, unspecified: Secondary | ICD-10-CM | POA: Diagnosis not present

## 2023-06-24 DIAGNOSIS — J440 Chronic obstructive pulmonary disease with acute lower respiratory infection: Secondary | ICD-10-CM | POA: Diagnosis not present

## 2023-06-24 DIAGNOSIS — I739 Peripheral vascular disease, unspecified: Secondary | ICD-10-CM | POA: Diagnosis not present

## 2023-06-24 DIAGNOSIS — U071 COVID-19: Secondary | ICD-10-CM | POA: Diagnosis not present

## 2023-06-24 DIAGNOSIS — F419 Anxiety disorder, unspecified: Secondary | ICD-10-CM | POA: Diagnosis not present

## 2023-06-24 DIAGNOSIS — I2510 Atherosclerotic heart disease of native coronary artery without angina pectoris: Secondary | ICD-10-CM | POA: Diagnosis not present

## 2023-06-24 DIAGNOSIS — I10 Essential (primary) hypertension: Secondary | ICD-10-CM | POA: Diagnosis not present

## 2023-06-24 DIAGNOSIS — J1282 Pneumonia due to coronavirus disease 2019: Secondary | ICD-10-CM | POA: Diagnosis not present

## 2023-06-24 DIAGNOSIS — Z7901 Long term (current) use of anticoagulants: Secondary | ICD-10-CM | POA: Diagnosis not present

## 2023-06-24 DIAGNOSIS — F32A Depression, unspecified: Secondary | ICD-10-CM | POA: Diagnosis not present

## 2023-06-24 DIAGNOSIS — J441 Chronic obstructive pulmonary disease with (acute) exacerbation: Secondary | ICD-10-CM | POA: Diagnosis not present

## 2023-06-24 DIAGNOSIS — Z7951 Long term (current) use of inhaled steroids: Secondary | ICD-10-CM | POA: Diagnosis not present

## 2023-06-27 DIAGNOSIS — M5416 Radiculopathy, lumbar region: Secondary | ICD-10-CM | POA: Diagnosis not present

## 2023-06-29 DIAGNOSIS — Z7951 Long term (current) use of inhaled steroids: Secondary | ICD-10-CM | POA: Diagnosis not present

## 2023-06-29 DIAGNOSIS — Z7901 Long term (current) use of anticoagulants: Secondary | ICD-10-CM | POA: Diagnosis not present

## 2023-06-29 DIAGNOSIS — F17210 Nicotine dependence, cigarettes, uncomplicated: Secondary | ICD-10-CM | POA: Diagnosis not present

## 2023-06-29 DIAGNOSIS — U071 COVID-19: Secondary | ICD-10-CM | POA: Diagnosis not present

## 2023-06-29 DIAGNOSIS — F419 Anxiety disorder, unspecified: Secondary | ICD-10-CM | POA: Diagnosis not present

## 2023-06-29 DIAGNOSIS — I739 Peripheral vascular disease, unspecified: Secondary | ICD-10-CM | POA: Diagnosis not present

## 2023-06-29 DIAGNOSIS — J440 Chronic obstructive pulmonary disease with acute lower respiratory infection: Secondary | ICD-10-CM | POA: Diagnosis not present

## 2023-06-29 DIAGNOSIS — J441 Chronic obstructive pulmonary disease with (acute) exacerbation: Secondary | ICD-10-CM | POA: Diagnosis not present

## 2023-06-29 DIAGNOSIS — F32A Depression, unspecified: Secondary | ICD-10-CM | POA: Diagnosis not present

## 2023-06-29 DIAGNOSIS — I2510 Atherosclerotic heart disease of native coronary artery without angina pectoris: Secondary | ICD-10-CM | POA: Diagnosis not present

## 2023-06-29 DIAGNOSIS — G4700 Insomnia, unspecified: Secondary | ICD-10-CM | POA: Diagnosis not present

## 2023-06-29 DIAGNOSIS — I10 Essential (primary) hypertension: Secondary | ICD-10-CM | POA: Diagnosis not present

## 2023-06-29 DIAGNOSIS — E785 Hyperlipidemia, unspecified: Secondary | ICD-10-CM | POA: Diagnosis not present

## 2023-06-29 DIAGNOSIS — I4891 Unspecified atrial fibrillation: Secondary | ICD-10-CM | POA: Diagnosis not present

## 2023-06-29 DIAGNOSIS — J9811 Atelectasis: Secondary | ICD-10-CM | POA: Diagnosis not present

## 2023-06-29 DIAGNOSIS — J1282 Pneumonia due to coronavirus disease 2019: Secondary | ICD-10-CM | POA: Diagnosis not present

## 2023-06-30 DIAGNOSIS — J1282 Pneumonia due to coronavirus disease 2019: Secondary | ICD-10-CM | POA: Diagnosis not present

## 2023-06-30 DIAGNOSIS — Z7901 Long term (current) use of anticoagulants: Secondary | ICD-10-CM | POA: Diagnosis not present

## 2023-06-30 DIAGNOSIS — Z7951 Long term (current) use of inhaled steroids: Secondary | ICD-10-CM | POA: Diagnosis not present

## 2023-06-30 DIAGNOSIS — I10 Essential (primary) hypertension: Secondary | ICD-10-CM | POA: Diagnosis not present

## 2023-06-30 DIAGNOSIS — F419 Anxiety disorder, unspecified: Secondary | ICD-10-CM | POA: Diagnosis not present

## 2023-06-30 DIAGNOSIS — J441 Chronic obstructive pulmonary disease with (acute) exacerbation: Secondary | ICD-10-CM | POA: Diagnosis not present

## 2023-06-30 DIAGNOSIS — F17210 Nicotine dependence, cigarettes, uncomplicated: Secondary | ICD-10-CM | POA: Diagnosis not present

## 2023-06-30 DIAGNOSIS — I739 Peripheral vascular disease, unspecified: Secondary | ICD-10-CM | POA: Diagnosis not present

## 2023-06-30 DIAGNOSIS — G4700 Insomnia, unspecified: Secondary | ICD-10-CM | POA: Diagnosis not present

## 2023-06-30 DIAGNOSIS — E785 Hyperlipidemia, unspecified: Secondary | ICD-10-CM | POA: Diagnosis not present

## 2023-06-30 DIAGNOSIS — I2510 Atherosclerotic heart disease of native coronary artery without angina pectoris: Secondary | ICD-10-CM | POA: Diagnosis not present

## 2023-06-30 DIAGNOSIS — F32A Depression, unspecified: Secondary | ICD-10-CM | POA: Diagnosis not present

## 2023-06-30 DIAGNOSIS — J9811 Atelectasis: Secondary | ICD-10-CM | POA: Diagnosis not present

## 2023-06-30 DIAGNOSIS — I4891 Unspecified atrial fibrillation: Secondary | ICD-10-CM | POA: Diagnosis not present

## 2023-06-30 DIAGNOSIS — U071 COVID-19: Secondary | ICD-10-CM | POA: Diagnosis not present

## 2023-06-30 DIAGNOSIS — J440 Chronic obstructive pulmonary disease with acute lower respiratory infection: Secondary | ICD-10-CM | POA: Diagnosis not present

## 2023-07-01 DIAGNOSIS — Z7951 Long term (current) use of inhaled steroids: Secondary | ICD-10-CM | POA: Diagnosis not present

## 2023-07-01 DIAGNOSIS — I2510 Atherosclerotic heart disease of native coronary artery without angina pectoris: Secondary | ICD-10-CM | POA: Diagnosis not present

## 2023-07-01 DIAGNOSIS — G4700 Insomnia, unspecified: Secondary | ICD-10-CM | POA: Diagnosis not present

## 2023-07-01 DIAGNOSIS — F419 Anxiety disorder, unspecified: Secondary | ICD-10-CM | POA: Diagnosis not present

## 2023-07-01 DIAGNOSIS — F32A Depression, unspecified: Secondary | ICD-10-CM | POA: Diagnosis not present

## 2023-07-01 DIAGNOSIS — I739 Peripheral vascular disease, unspecified: Secondary | ICD-10-CM | POA: Diagnosis not present

## 2023-07-01 DIAGNOSIS — J440 Chronic obstructive pulmonary disease with acute lower respiratory infection: Secondary | ICD-10-CM | POA: Diagnosis not present

## 2023-07-01 DIAGNOSIS — I10 Essential (primary) hypertension: Secondary | ICD-10-CM | POA: Diagnosis not present

## 2023-07-01 DIAGNOSIS — U071 COVID-19: Secondary | ICD-10-CM | POA: Diagnosis not present

## 2023-07-01 DIAGNOSIS — Z7901 Long term (current) use of anticoagulants: Secondary | ICD-10-CM | POA: Diagnosis not present

## 2023-07-01 DIAGNOSIS — J9811 Atelectasis: Secondary | ICD-10-CM | POA: Diagnosis not present

## 2023-07-01 DIAGNOSIS — F17210 Nicotine dependence, cigarettes, uncomplicated: Secondary | ICD-10-CM | POA: Diagnosis not present

## 2023-07-01 DIAGNOSIS — E785 Hyperlipidemia, unspecified: Secondary | ICD-10-CM | POA: Diagnosis not present

## 2023-07-01 DIAGNOSIS — I4891 Unspecified atrial fibrillation: Secondary | ICD-10-CM | POA: Diagnosis not present

## 2023-07-01 DIAGNOSIS — J441 Chronic obstructive pulmonary disease with (acute) exacerbation: Secondary | ICD-10-CM | POA: Diagnosis not present

## 2023-07-01 DIAGNOSIS — J1282 Pneumonia due to coronavirus disease 2019: Secondary | ICD-10-CM | POA: Diagnosis not present

## 2023-07-13 ENCOUNTER — Ambulatory Visit: Payer: Medicare Other

## 2023-07-13 ENCOUNTER — Other Ambulatory Visit: Payer: Medicare Other

## 2023-07-13 DIAGNOSIS — J189 Pneumonia, unspecified organism: Secondary | ICD-10-CM | POA: Diagnosis not present

## 2023-07-13 DIAGNOSIS — I517 Cardiomegaly: Secondary | ICD-10-CM | POA: Diagnosis not present

## 2023-07-14 ENCOUNTER — Telehealth: Payer: Self-pay

## 2023-07-14 ENCOUNTER — Encounter (INDEPENDENT_AMBULATORY_CARE_PROVIDER_SITE_OTHER): Payer: Self-pay | Admitting: Nurse Practitioner

## 2023-07-14 ENCOUNTER — Ambulatory Visit (INDEPENDENT_AMBULATORY_CARE_PROVIDER_SITE_OTHER): Payer: Medicare Other | Admitting: Nurse Practitioner

## 2023-07-14 ENCOUNTER — Ambulatory Visit (INDEPENDENT_AMBULATORY_CARE_PROVIDER_SITE_OTHER): Payer: Medicare Other

## 2023-07-14 VITALS — BP 126/86 | HR 86 | Resp 18 | Ht 67.0 in | Wt 156.8 lb

## 2023-07-14 DIAGNOSIS — I10 Essential (primary) hypertension: Secondary | ICD-10-CM

## 2023-07-14 DIAGNOSIS — R410 Disorientation, unspecified: Secondary | ICD-10-CM | POA: Diagnosis not present

## 2023-07-14 DIAGNOSIS — I739 Peripheral vascular disease, unspecified: Secondary | ICD-10-CM | POA: Diagnosis not present

## 2023-07-14 DIAGNOSIS — M7989 Other specified soft tissue disorders: Secondary | ICD-10-CM | POA: Diagnosis not present

## 2023-07-14 NOTE — Telephone Encounter (Signed)
 Received [facility] orders signed and faxed back.

## 2023-07-15 NOTE — Progress Notes (Signed)
 Subjective:    Patient ID: Karl Myers, male    DOB: 19-Dec-1945, 78 y.o.   MRN: [id number] Chief Complaint  Patient presents with   New Patient (Initial Visit)    np. ABI + consult. PAD. sonnenberg, eric.    The patient is a 78 year old male who presents today as a referral after having recent abnormal home health test showing possible significant peripheral arterial disease.  He denies claudication-like symptoms.  There is no rest pain or open wounds or ulcerations.  The patient's only complaint is of lower extremity edema.  He notes that its happened recently over the last several weeks.  They have tried compression but they have been too tight to utilize.  He has also been elevating his lower extremities and that has been helpful for him.  He denies any TIA or amaurosis fugax like symptoms.  His wife does have concerns for memory and some confusion issues.  The patient notes that this has happened since he had COVID.  Today his noninvasive studies show an ABI of 1.12 in the left and 1.12 on the right.  He has good toe pressures bilaterally.  He has strong triphasic tibial artery waveforms bilaterally normal toe waveforms bilaterally.    Review of Systems  Cardiovascular:  Positive for leg swelling.  Psychiatric/Behavioral:  Positive for confusion.   All other systems reviewed and are negative.      Objective:   Physical Exam Vitals reviewed.  HENT:     Head: Normocephalic.  Cardiovascular:     Rate and Rhythm: Normal rate.     Pulses: Normal pulses.  Pulmonary:     Effort: Pulmonary effort is normal.  Musculoskeletal:     Right lower leg: No edema.     Left lower leg: No edema.  Skin:    General: Skin is warm and dry.  Neurological:     Mental Status: He is alert and oriented to person, place, and time.  Psychiatric:        Mood and Affect: Mood normal.        Behavior: Behavior normal.        Thought Content: Thought content normal.        Judgment: Judgment  normal.     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 Breslow's   Pre-diabetes     Social History   Socioeconomic History   Marital status: Married    Spouse name: Not on file   Number of children: Not on file   Years of education: Not on file   Highest education level: Not on file  Occupational History   Not on file  Tobacco Use   Smoking status: Every Day    Current packs/day: 0.25    Average packs/day: 0.3 packs/day for 65.0 years (16.3 ttl pk-yrs)    Types: Cigars, Cigarettes   Smokeless tobacco: Never   Tobacco comments:    0.5PPD 12/07/2022  Vaping Use  Vaping status: Never Used  Substance and Sexual Activity   Alcohol use: No   Drug use: No   Sexual activity: Not Currently  Other Topics Concern   Not on file  Social History Narrative   Married   Social Drivers of Health   Financial Resource Strain: Low Risk  (03/30/2023)   Overall Financial Resource Strain (CARDIA)    Difficulty of Paying Living Expenses: Not hard at all  Food Insecurity: No Food Insecurity (03/30/2023)   Hunger Vital Sign    Worried About Running Out of Food in the Last Year: Never true    Ran Out of Food in the Last Year: Never true  Transportation Needs: No Transportation Needs (03/30/2023)   PRAPARE - Administrator, Civil Service (Medical): No    Lack of Transportation (Non-Medical): No  Physical Activity: Inactive (03/30/2023)   Exercise Vital Sign    Days of Exercise per Week: 0 days    Minutes of Exercise per Session: 0 min  Stress: No Stress Concern Present  (03/30/2023)   [employer] of Occupational Health - Occupational Stress Questionnaire    Feeling of Stress : Only a little  Social Connections: Socially Integrated (03/30/2023)   Social Connection and Isolation Panel [NHANES]    Frequency of Communication with Friends and Family: More than three times a week    Frequency of Social Gatherings with Friends and Family: More than three times a week    Attends Religious Services: More than 4 times per year    Active Member of [employer] or Organizations: Yes    Attends Banker Meetings: More than 4 times per year    Marital Status: Married  Catering Manager Violence: Not At Risk (03/30/2023)   Humiliation, Afraid, Rape, and Kick questionnaire    Fear of Current or Ex-Partner: No    Emotionally Abused: No    Physically Abused: No    Sexually Abused: No    Past Surgical History:  Procedure Laterality Date   BACK SURGERY     x 3   CARPAL TUNNEL RELEASE Right 08/19/2017   Procedure: CARPAL TUNNEL RELEASE;  Surgeon: Kathlynn Sharper, MD;  Location: [facility] ORS;  Service: Orthopedics;  Laterality: Right;   CATARACT EXTRACTION W/PHACO Right 11/17/2016   Procedure: CATARACT EXTRACTION PHACO AND INTRAOCULAR LENS PLACEMENT (IOC);  Surgeon: Jaye Fallow, MD;  Location: [facility] ORS;  Service: Ophthalmology;  Laterality: Right;  US  00:37 AP% 12.6 CDE 4.69 Fluid pack lot # [id number]   CATARACT EXTRACTION W/PHACO Left 12/08/2016   Procedure: CATARACT EXTRACTION PHACO AND INTRAOCULAR LENS PLACEMENT (IOC);  Surgeon: Jaye Fallow, MD;  Location: [facility] OR[facility];  Service: Ophthalmology;  Laterality: Left;  US  00:33 AP% 13.9 CDE 4.63 Fluid pack lot # [id number]   TOE AMPUTATION Left    ULNAR TUNNEL RELEASE Right 08/19/2017   Procedure: CUBITAL TUNNEL RELEASE;  Surgeon: Kathlynn Sharper, MD;  Location: [facility] OR[facility];  Service: Orthopedics;  Laterality: Right;    Family History  Problem Relation Age of Onset   Heart attack Mother    Prostate cancer  Father    Breast cancer Sister 63   Dementia Sister    Cancer Brother        unknown what kind    Allergies  Allergen Reactions   Hydrocodone-Acetaminophen  Other (See Comments)    With large quantities hyper   Neurontin  [Gabapentin ] Other (See Comments)    With large quantities hyper        Latest Ref Rng &  Units 06/09/2023    4:28 AM 06/08/2023    3:41 AM 05/12/2023    9:30 AM  CBC  WBC 4.0 - 10.5 K/uL 7.1  10.4  10.5   Hemoglobin 13.0 - 17.0 g/dL 86.7  84.9  84.5   Hematocrit 39.0 - 52.0 % 38.5  44.8  46.3   Platelets 150 - 400 K/uL 126  153  195       CMP     Component Value Date/Time   NA 135 06/09/2023 0428   NA 137 05/06/2014 0751   K 4.3 06/09/2023 0428   K 4.1 05/06/2014 0751   CL 101 06/09/2023 0428   CL 103 05/06/2014 0751   CO2 23 06/09/2023 0428   CO2 28 05/06/2014 0751   GLUCOSE 137 (H) 06/09/2023 0428   GLUCOSE 121 (H) 05/06/2014 0751   BUN 21 06/09/2023 0428   BUN 10 05/06/2014 0751   CREATININE 0.91 06/09/2023 0428   CREATININE 1.00 06/17/2018 1439   CALCIUM  8.2 (L) 06/09/2023 0428   CALCIUM  8.6 05/06/2014 0751   PROT 6.5 06/08/2023 0341   ALBUMIN 3.9 06/08/2023 0341   AST 23 06/08/2023 0341   ALT 17 06/08/2023 0341   ALKPHOS 76 06/08/2023 0341   BILITOT 1.0 06/08/2023 0341   GFR 64.71 04/09/2023 1049   GFRNONAA >60 06/09/2023 0428   GFRNONAA >60 05/06/2014 0751     No results found.     Assessment & Plan:   1. PAD (peripheral artery disease) (HCC) (Primary) Today noninvasive studies show normal arterial perfusion.  No significant alteration in perfusion as detected by his abnormal home health test.  No further intervention required on his bilateral lower extremities.  2. Primary hypertension Continue antihypertensive medications as already ordered, these medications have been reviewed and there are no changes at this time.  3. Leg swelling Since the patient's referral he is recently started having some lower extremity edema.   He does have some varicosities and so venous insufficiency is certainly a possibility.  I have discussed conservative therapy including use of medical grade compression, elevation and activity.  They have tried compression but have difficulty with sliding the monitor so I have suggested zippered compression.  Will have her return with a bilateral venous reflux study.  4. Confusion The patient's wife has some concerns about the patient's memory and confusion at times.  She notes that this has happened after he had COVID.  There is concern that he may have some carotid disease causing some of his issues.  Will have a carotid duplex when he returns.   Current Outpatient Medications on File Prior to Visit  Medication Sig Dispense Refill   ALPRAZolam  (XANAX ) 0.5 MG tablet TAKE 1 TABLET BY MOUTH AT BEDTIME AS NEEDED FOR ANXIETY. 30 tablet 1   apixaban  (ELIQUIS ) 5 MG TABS tablet TAKE ONE TABLET BY MOUTH TWICE DAILY 180 tablet 1   atenolol  (TENORMIN ) 25 MG tablet Take 1 tablet (25 mg total) by mouth at bedtime. 90 tablet 3   atorvastatin  (LIPITOR) 80 MG tablet TAKE 1 TABLET BY MOUTH DAILY 90 tablet 1   Cholecalciferol (VITAMIN D3) 25 MCG (1000 UT) CAPS Take by mouth.     cyanocobalamin  (VITAMIN B12) 1000 MCG tablet Take 1 tablet (1,000 mcg total) by mouth daily. 90 tablet 1   fluticasone  furoate-vilanterol (BREO ELLIPTA ) 200-25 MCG/ACT AEPB Inhale into the lungs.     Fluticasone -Umeclidin-Vilant (TRELEGY ELLIPTA ) 100-62.5-25 MCG/ACT AEPB Inhale 1 puff into the lungs daily. 28 each 2  gabapentin  (NEURONTIN ) 100 MG capsule TAKE 1 CAPSULE BY MOUTH 3 TIMES DAILY. (Patient taking differently: Take 100 mg by mouth at bedtime.) 270 capsule 1   guaiFENesin  (SCOT-TUSSIN EXPECTORANT) 100 MG/5ML liquid Take by mouth.     lidocaine  (LIDODERM ) 5 % as needed.     lisinopril  (ZESTRIL ) 10 MG tablet Take by mouth.     nirmatrelvir/ritonavir (PAXLOVID) 20 x 150 MG & 10 x 100MG  TBPK Complete 8 more doses of the package. 3  tablets in the morning and 3 tablets at night.     nitroGLYCERIN  (NITROSTAT ) 0.4 MG SL tablet Place 1 tablet (0.4 mg total) under the tongue every 5 (five) minutes as needed for chest pain. For up to 3 doses per episode. 50 tablet 0   saccharomyces boulardii (FLORASTOR) 250 MG capsule Take 1 capsule (250 mg total) by mouth daily. 90 capsule 0   traMADol  (ULTRAM ) 50 MG tablet Take 50 mg by mouth 2 (two) times daily as needed for moderate pain (pain score 4-6) or severe pain (pain score 7-10).     traZODone  (DESYREL ) 150 MG tablet Take 1 tablet (150 mg total) by mouth at bedtime. 90 tablet 1   umeclidinium bromide  (INCRUSE ELLIPTA ) 62.5 MCG/ACT AEPB Inhale into the lungs.     valsartan  (DIOVAN ) 320 MG tablet Take 1 tablet (320 mg total) by mouth daily. 90 tablet 1   No current facility-administered medications on file prior to visit.    There are no Patient Instructions on file for this visit. No follow-ups on file.   Loi Rennaker E Claris Pech, NP

## 2023-07-21 NOTE — Telephone Encounter (Signed)
Copied from CRM [id number]. Topic: Clinical - Lab/Test Results >> Jul 21, 2023  9:53 AM Karl Myers wrote: Reason for CRM: Patient called to follow up on his chest X-ray results. No results found in chart. Please check on results since it has been 2 weeks since Xray

## 2023-07-21 NOTE — Telephone Encounter (Signed)
Spoke with [facility] Radiology Reading room to ask if they could add patient to the STAT results list. Jasmine December verbalized understanding.

## 2023-07-30 DIAGNOSIS — M48062 Spinal stenosis, lumbar region with neurogenic claudication: Secondary | ICD-10-CM | POA: Diagnosis not present

## 2023-07-30 DIAGNOSIS — M5416 Radiculopathy, lumbar region: Secondary | ICD-10-CM | POA: Diagnosis not present

## 2023-08-04 ENCOUNTER — Other Ambulatory Visit: Payer: Self-pay | Admitting: Family Medicine

## 2023-08-04 DIAGNOSIS — G479 Sleep disorder, unspecified: Secondary | ICD-10-CM

## 2023-08-08 DIAGNOSIS — R7303 Prediabetes: Secondary | ICD-10-CM | POA: Diagnosis not present

## 2023-08-08 DIAGNOSIS — R9431 Abnormal electrocardiogram [ECG] [EKG]: Secondary | ICD-10-CM | POA: Diagnosis not present

## 2023-08-08 DIAGNOSIS — R079 Chest pain, unspecified: Secondary | ICD-10-CM | POA: Diagnosis not present

## 2023-08-08 DIAGNOSIS — F419 Anxiety disorder, unspecified: Secondary | ICD-10-CM | POA: Diagnosis not present

## 2023-08-08 DIAGNOSIS — I10 Essential (primary) hypertension: Secondary | ICD-10-CM | POA: Diagnosis not present

## 2023-08-08 DIAGNOSIS — Z79899 Other long term (current) drug therapy: Secondary | ICD-10-CM | POA: Diagnosis not present

## 2023-08-08 DIAGNOSIS — I4891 Unspecified atrial fibrillation: Secondary | ICD-10-CM | POA: Diagnosis not present

## 2023-08-08 DIAGNOSIS — I739 Peripheral vascular disease, unspecified: Secondary | ICD-10-CM | POA: Diagnosis not present

## 2023-08-08 DIAGNOSIS — J189 Pneumonia, unspecified organism: Secondary | ICD-10-CM | POA: Diagnosis not present

## 2023-08-08 DIAGNOSIS — J9811 Atelectasis: Secondary | ICD-10-CM | POA: Diagnosis not present

## 2023-08-08 DIAGNOSIS — N400 Enlarged prostate without lower urinary tract symptoms: Secondary | ICD-10-CM | POA: Diagnosis not present

## 2023-08-08 DIAGNOSIS — R918 Other nonspecific abnormal finding of lung field: Secondary | ICD-10-CM | POA: Diagnosis not present

## 2023-08-08 DIAGNOSIS — Z20822 Contact with and (suspected) exposure to covid-19: Secondary | ICD-10-CM | POA: Diagnosis not present

## 2023-08-08 DIAGNOSIS — R0602 Shortness of breath: Secondary | ICD-10-CM | POA: Diagnosis not present

## 2023-08-08 DIAGNOSIS — N200 Calculus of kidney: Secondary | ICD-10-CM | POA: Diagnosis not present

## 2023-08-08 DIAGNOSIS — J449 Chronic obstructive pulmonary disease, unspecified: Secondary | ICD-10-CM | POA: Diagnosis not present

## 2023-08-08 DIAGNOSIS — E279 Disorder of adrenal gland, unspecified: Secondary | ICD-10-CM | POA: Diagnosis not present

## 2023-08-08 DIAGNOSIS — E278 Other specified disorders of adrenal gland: Secondary | ICD-10-CM | POA: Diagnosis not present

## 2023-08-08 DIAGNOSIS — I2510 Atherosclerotic heart disease of native coronary artery without angina pectoris: Secondary | ICD-10-CM | POA: Diagnosis not present

## 2023-08-08 DIAGNOSIS — F17210 Nicotine dependence, cigarettes, uncomplicated: Secondary | ICD-10-CM | POA: Diagnosis not present

## 2023-08-08 DIAGNOSIS — M549 Dorsalgia, unspecified: Secondary | ICD-10-CM | POA: Diagnosis not present

## 2023-08-08 DIAGNOSIS — Z7901 Long term (current) use of anticoagulants: Secondary | ICD-10-CM | POA: Diagnosis not present

## 2023-08-08 DIAGNOSIS — E785 Hyperlipidemia, unspecified: Secondary | ICD-10-CM | POA: Diagnosis not present

## 2023-08-08 DIAGNOSIS — M4316 Spondylolisthesis, lumbar region: Secondary | ICD-10-CM | POA: Diagnosis not present

## 2023-08-08 DIAGNOSIS — Z7951 Long term (current) use of inhaled steroids: Secondary | ICD-10-CM | POA: Diagnosis not present

## 2023-08-08 DIAGNOSIS — M545 Low back pain, unspecified: Secondary | ICD-10-CM | POA: Diagnosis not present

## 2023-08-09 DIAGNOSIS — J189 Pneumonia, unspecified organism: Secondary | ICD-10-CM | POA: Diagnosis not present

## 2023-08-09 DIAGNOSIS — R9431 Abnormal electrocardiogram [ECG] [EKG]: Secondary | ICD-10-CM | POA: Diagnosis not present

## 2023-08-09 DIAGNOSIS — I4891 Unspecified atrial fibrillation: Secondary | ICD-10-CM | POA: Diagnosis not present

## 2023-08-10 ENCOUNTER — Telehealth: Payer: Self-pay

## 2023-08-10 NOTE — Transitions of Care (Post Inpatient/ED Visit) (Signed)
 08/10/2023  Name: Karl Myers MRN: [id number] DOB: 12-23-1945  Today's TOC FU Call Status: Today's TOC FU Call Status:: Successful TOC FU Call Completed TOC FU Call Complete Date: 08/10/23 Patient's Name and Date of Birth confirmed.  Transition Care Management Follow-up Telephone Call Date of Discharge: 08/09/23 Discharge Facility: Other [facility]) Name of Other (Non[facility]) Discharge Facility: [facility] Type of Discharge: Emergency Department Reason for ED Visit: Respiratory Respiratory Diagnosis: Pnuemonia How have you been since you were released from the hospital?: Better Any questions or concerns?: No  Items Reviewed: Did you receive and understand the discharge instructions provided?: Yes Medications obtained,verified, and reconciled?: Yes (Medications Reviewed) Any new allergies since your discharge?: No Dietary orders reviewed?: Yes Do you have support at home?: Yes People in Home: spouse  Medications Reviewed Today: Medications Reviewed Today     Reviewed by Karena Addison, LPN (Licensed Practical Nurse) on 08/10/23 at 1211  Med List Status: <None>   Medication Order Taking? Sig Documenting Provider Last Dose Status Informant  ALPRAZolam (XANAX) 0.5 MG tablet [id number]  TAKE ONE TABLET AT BEDTIME AS NEEDED Orland Jarred, Yehuda Mao, MD  Active   apixaban (ELIQUIS) 5 MG TABS tablet [id number] No TAKE ONE TABLET BY MOUTH TWICE DAILY Gollan, Tollie Pizza, MD Taking Active Self, Spouse/Significant Other, Pharmacy Records  atenolol (TENORMIN) 25 MG tablet [id number] No Take 1 tablet (25 mg total) by mouth at bedtime. Glori Luis, MD Taking Active Self, Spouse/Significant Other, Pharmacy Records  atorvastatin (LIPITOR) 80 MG tablet [id number] No TAKE 1 TABLET BY MOUTH DAILY Glori Luis, MD Taking Active Self, Spouse/Significant Other, Pharmacy Records  Cholecalciferol (VITAMIN D3) 25 MCG (1000 UT) CAPS [id number] No Take by mouth. [provider] Taking Active   cyanocobalamin (VITAMIN B12) 1000 MCG tablet [id number] No Take 1 tablet (1,000 mcg total) by mouth daily. [name], MD Taking Active Self, Spouse/Significant Other, Pharmacy Records  fluticasone furoate-vilanterol (BREO ELLIPTA) 200-25 MCG/ACT AEPB [id number] No Inhale into the lungs. [provider] Taking Active   Fluticasone-Umeclidin-Vilant (TRELEGY ELLIPTA) 100-62.5-25 MCG/ACT AEPB [id number] No Inhale 1 puff into the lungs daily. Lewie Chamber, MD Taking Active   gabapentin (NEURONTIN) 100 MG capsule [id number] No TAKE 1 CAPSULE BY MOUTH 3 TIMES DAILY.  Patient taking differently: Take 100 mg by mouth at bedtime.   Glori Luis, MD Taking Active Self, Spouse/Significant Other, Pharmacy Records           Med Note [facility] A   Tue Jun 08, 2023  1:19 PM) Patient usually just takes one capsule at bedtime- if he takes anymore he starts getting confused  guaiFENesin (SCOT-TUSSIN EXPECTORANT) 100 MG/5ML liquid [id number] No Take by mouth. [provider] Taking Active   lidocaine (LIDODERM) 5 % [id number] No as needed. [provider] Taking Active   lisinopril (ZESTRIL) 10 MG tablet [id number] No Take by mouth. [provider] Taking Active   nirmatrelvir/ritonavir (PAXLOVID) 20 x 150 MG & 10 x 100MG  TBPK [id number] No Complete 8 more doses of the package. 3 tablets in the morning and 3 tablets at night. [provider] Taking Active   nitroGLYCERIN (NITROSTAT) 0.4 MG SL tablet [id number] No Place 1 tablet (0.4 mg total) under the tongue every 5 (five) minutes as needed for chest pain. For up to 3 doses per episode. Glori Luis, MD Taking Active Self, Spouse/Significant Other, Pharmacy Records  saccharomyces boulardii [facility]) 250 MG capsule [id number] No Take 1 capsule (250 mg total) by mouth  daily. Dana Allan, MD Taking Active Self, Spouse/Significant Other, Pharmacy Records  traMADol (ULTRAM) 50 MG tablet  [id number] No Take 50 mg by mouth 2 (two) times daily as needed for moderate pain (pain score 4-6) or severe pain (pain score 7-10). [provider] Taking Active Self, Spouse/Significant Other, Pharmacy Records  traZODone (DESYREL) 150 MG tablet [id number]  TAKE 1 TABLET BY MOUTH AT BEDTIME. Glori Luis, MD  Active   umeclidinium bromide (INCRUSE ELLIPTA) 62.5 MCG/ACT AEPB [id number] No Inhale into the lungs. [provider] Taking Active   valsartan (DIOVAN) 320 MG tablet [id number] No Take 1 tablet (320 mg total) by mouth daily. Glori Luis, MD Taking Active Self, Spouse/Significant Other, Pharmacy Records            Home Care and Equipment/Supplies: Were Home Health Services Ordered?: NA Any new equipment or medical supplies ordered?: NA  Functional Questionnaire: Do you need assistance with bathing/showering or dressing?: No Do you need assistance with meal preparation?: No Do you need assistance with eating?: No Do you have difficulty maintaining continence: No Do you need assistance with getting out of bed/getting out of a chair/moving?: No Do you have difficulty managing or taking your medications?: No  Follow up appointments reviewed: PCP Follow-up appointment confirmed?: No (wife will call schedule appt) MD Provider Line [phone] Given: No Specialist Hospital Follow-up appointment confirmed?: NA Do you need transportation to your follow-up appointment?: No Do you understand care options if your condition(s) worsen?: Yes-patient verbalized understanding    SIGNATURE Karena Addison, LPN [facility] Nurse Health Advisor Direct Dial [phone]

## 2023-08-16 ENCOUNTER — Other Ambulatory Visit: Payer: Self-pay

## 2023-08-16 ENCOUNTER — Encounter: Payer: Self-pay | Admitting: Internal Medicine

## 2023-08-16 ENCOUNTER — Ambulatory Visit (INDEPENDENT_AMBULATORY_CARE_PROVIDER_SITE_OTHER): Admitting: Internal Medicine

## 2023-08-16 ENCOUNTER — Other Ambulatory Visit: Payer: Self-pay | Admitting: *Deleted

## 2023-08-16 VITALS — BP 118/68 | HR 71 | Temp 97.4°F | Ht 67.0 in | Wt 163.0 lb

## 2023-08-16 DIAGNOSIS — N179 Acute kidney failure, unspecified: Secondary | ICD-10-CM | POA: Diagnosis not present

## 2023-08-16 DIAGNOSIS — E279 Disorder of adrenal gland, unspecified: Secondary | ICD-10-CM

## 2023-08-16 DIAGNOSIS — J189 Pneumonia, unspecified organism: Secondary | ICD-10-CM | POA: Diagnosis not present

## 2023-08-16 LAB — BASIC METABOLIC PANEL WITH GFR
BUN: 16 mg/dL (ref 6–23)
CO2: 30 meq/L (ref 19–32)
Calcium: 8.9 mg/dL (ref 8.4–10.5)
Chloride: 103 meq/L (ref 96–112)
Creatinine, Ser: 1.07 mg/dL (ref 0.40–1.50)
GFR: 66.72 mL/min
Glucose, Bld: 121 mg/dL — ABNORMAL HIGH (ref 70–99)
Potassium: 4.2 meq/L (ref 3.5–5.1)
Sodium: 140 meq/L (ref 135–145)

## 2023-08-16 NOTE — Patient Instructions (Addendum)
-  It was a pleasure meeting you today -I am glad that you are feeling better.  Please complete the course of the antibiotics for your pneumonia. -I have put an order for repeat chest x-ray to be done in early April to ensure resolution of the pneumonia -I have put in for swallow evaluation as we are concerned that the pneumonia may have been secondary to aspirating food content down to your lung. -I have also put in for blood work to check your kidney function today.  It did look like it was abnormal at your hospital visit likely secondary to your pneumonia. -Please contact us with any questions or concerns or if you have any recurrent symptoms

## 2023-08-16 NOTE — Assessment & Plan Note (Signed)
-  Patient was recently diagnosed with left lower lobe likely aspiration pneumonia at [facility] at the beginning of March (March 3) -Patient was prescribed a 7-day course of metronidazole and vomiting in the course of Augmentin -Patient states that he has no shortness of breath and only occasional cough currently -He does state that he has had no issues swallowing but that occasionally he may fall asleep with food in his mouth and that may have caused the aspiration -We will refer him to speech therapy for swallow evaluation -No further workup at this time -Return precautions given to the patient

## 2023-08-16 NOTE — Progress Notes (Signed)
 Acute Office Visit  Subjective:     Patient ID: Karl Myers, male    DOB: 10-09-1945, 78 y.o.   MRN: [id number]  Chief Complaint  Patient presents with   Hospitalization Follow-up    HPI Patient is in today for follow-up from recent ED visit for pneumonia.  Patient states that he was seen recently at [facility] for acute left-sided back pain.  At that visit he was diagnosed with left lower lobe pneumonia likely secondary to aspiration.  He was discharged on a 10-day course of Augmentin and a 7-day course of metronidazole for possible aspiration pneumonia.  Patient states that he currently feels well with only occasional cough and no shortness of breath.  He has no fevers or chills.  His back pain has improved after a recent spinal injection.  Review of Systems  Constitutional: Negative.   HENT: Negative.    Respiratory:  Positive for cough. Negative for sputum production and shortness of breath.   Cardiovascular: Negative.   Musculoskeletal:  Negative for back pain.  Skin: Negative.   Neurological: Negative.   Psychiatric/Behavioral: Negative.          Objective:    BP 118/68   Pulse 71   Temp (!) 97.4 F (36.3 C)   Ht 5\' 7"  (1.702 m)   Wt 163 lb (73.9 kg)   SpO2 95%   BMI 25.53 kg/m    Physical Exam Constitutional:      Appearance: Normal appearance.  HENT:     Head: Normocephalic and atraumatic.  Cardiovascular:     Rate and Rhythm: Normal rate and regular rhythm.  Pulmonary:     Effort: Pulmonary effort is normal. No respiratory distress.     Breath sounds: Normal breath sounds. No wheezing or rales.  Neurological:     Mental Status: He is alert and oriented to person, place, and time.  Psychiatric:        Mood and Affect: Mood normal.        Behavior: Behavior normal.     No results found for any visits on 08/16/23.      Assessment & Plan:   Problem List Items Addressed This Visit       Respiratory   Pneumonia due to infectious  organism   -Patient was recently diagnosed with left lower lobe likely aspiration pneumonia at [facility] at the beginning of March (March 3) -Patient was prescribed a 7-day course of metronidazole and vomiting in the course of Augmentin -Patient states that he has no shortness of breath and only occasional cough currently -He does state that he has had no issues swallowing but that occasionally he may fall asleep with food in his mouth and that may have caused the aspiration -We will refer him to speech therapy for swallow evaluation -No further workup at this time -Return precautions given to the patient      Relevant Orders   DG Chest 2 View   Ambulatory referral to Speech Therapy     Genitourinary   AKI (acute kidney injury) (HCC) - Primary   -At his recent visit to [facility] he was noted to have elevated creatinine up to 1.3 (March 3) up from his baseline of 0.9 consistent with an AKI -I suspect is likely secondary to prerenal etiology. -Patient feels like he is back to baseline now -Will repeat his BMP today -No further workup for now      Relevant Orders   Basic metabolic panel  Other   Adrenal nodule (HCC)   -Patient had a CT abdomen/pelvis at [facility] and was noted to have bilateral intermediate density adrenal nodules (1.4 cm on the right and 2 cm on the left) -It was recommended to do CT adrenal protocol for further evaluation of these nodules -I have put this order in today and will follow-up results -No further workup for now      Relevant Orders   CT ADRENAL ABDOMEN WO CONTRAST    No orders of the defined types were placed in this encounter.   No follow-ups on file.  Earl Lagos, MD

## 2023-08-16 NOTE — Assessment & Plan Note (Signed)
-  Patient had a CT abdomen/pelvis at [facility] and was noted to have bilateral intermediate density adrenal nodules (1.4 cm on the right and 2 cm on the left) -It was recommended to do CT adrenal protocol for further evaluation of these nodules -I have put this order in today and will follow-up results -No further workup for now

## 2023-08-16 NOTE — Addendum Note (Signed)
 Addended by: Warden Fillers on: 08/16/2023 03:41 PM   Modules accepted: Orders

## 2023-08-16 NOTE — Assessment & Plan Note (Signed)
-  At his recent visit to [facility] he was noted to have elevated creatinine up to 1.3 (March 3) up from his baseline of 0.9 consistent with an AKI -I suspect is likely secondary to prerenal etiology. -Patient feels like he is back to baseline now -Will repeat his BMP today -No further workup for now

## 2023-08-18 ENCOUNTER — Telehealth: Payer: Self-pay

## 2023-08-18 NOTE — Telephone Encounter (Signed)
 Message already handled please see result note.

## 2023-08-18 NOTE — Telephone Encounter (Signed)
Left message to call the office back. Please send call to [facility].

## 2023-08-18 NOTE — Telephone Encounter (Signed)
-----   Message from Earl Lagos sent at 08/18/2023  4:17 PM EDT ----- Hello all, Can you contact this patient and let him know that his kidney function has improved.  No further workup at this time.  Can we also ask him if he knows when his appointment is for his CT abdomen adrenal protocol?  Thank you, Dr. Heide Spark ----- Message ----- From: Interface, Lab In [facility] One Sent: 08/16/2023   2:18 PM EDT To: Earl Lagos, MD

## 2023-08-25 ENCOUNTER — Other Ambulatory Visit (INDEPENDENT_AMBULATORY_CARE_PROVIDER_SITE_OTHER): Payer: Self-pay | Admitting: Nurse Practitioner

## 2023-08-25 DIAGNOSIS — M25561 Pain in right knee: Secondary | ICD-10-CM | POA: Diagnosis not present

## 2023-08-25 DIAGNOSIS — M7989 Other specified soft tissue disorders: Secondary | ICD-10-CM

## 2023-08-25 DIAGNOSIS — R410 Disorientation, unspecified: Secondary | ICD-10-CM

## 2023-08-25 IMAGING — DX DG KNEE COMPLETE 4+V*R*
5 series · 5 of 5 positions shown · non-contrast
Comparison: None.

CLINICAL DATA: Right knee pain for 2 weeks

EXAM:
RIGHT KNEE - COMPLETE 4+ VIEW

[knee standing ap]
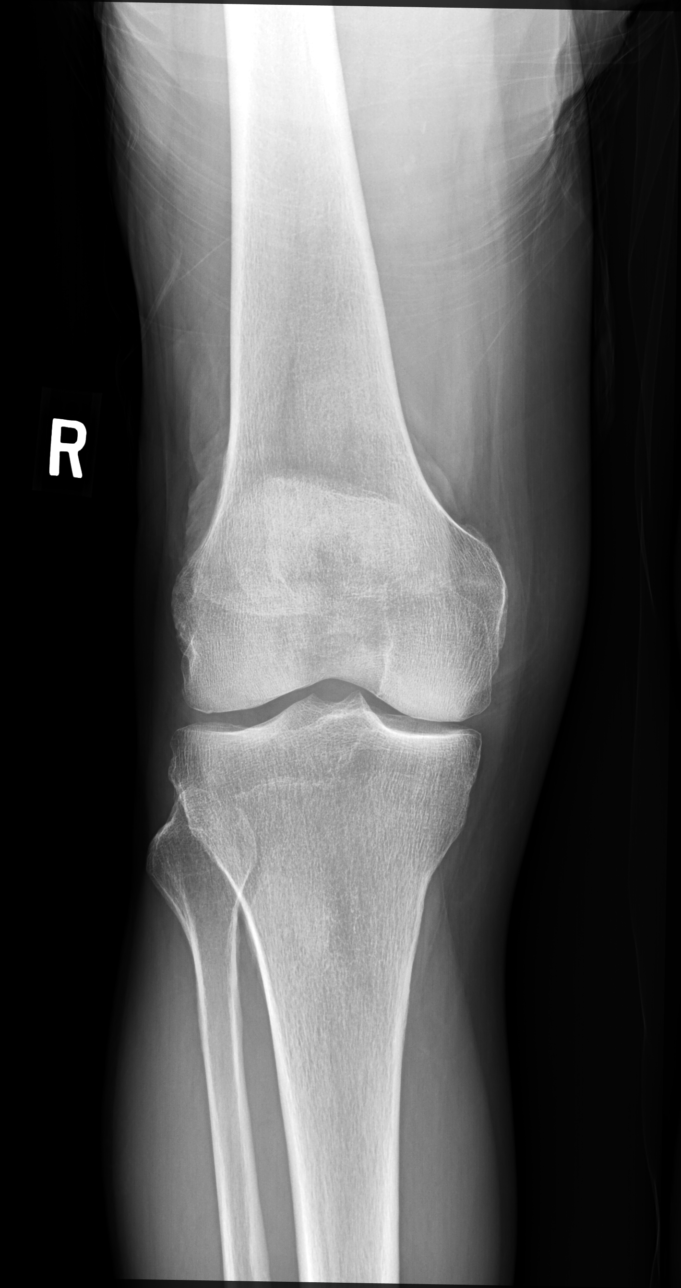

[knee standing external ap]
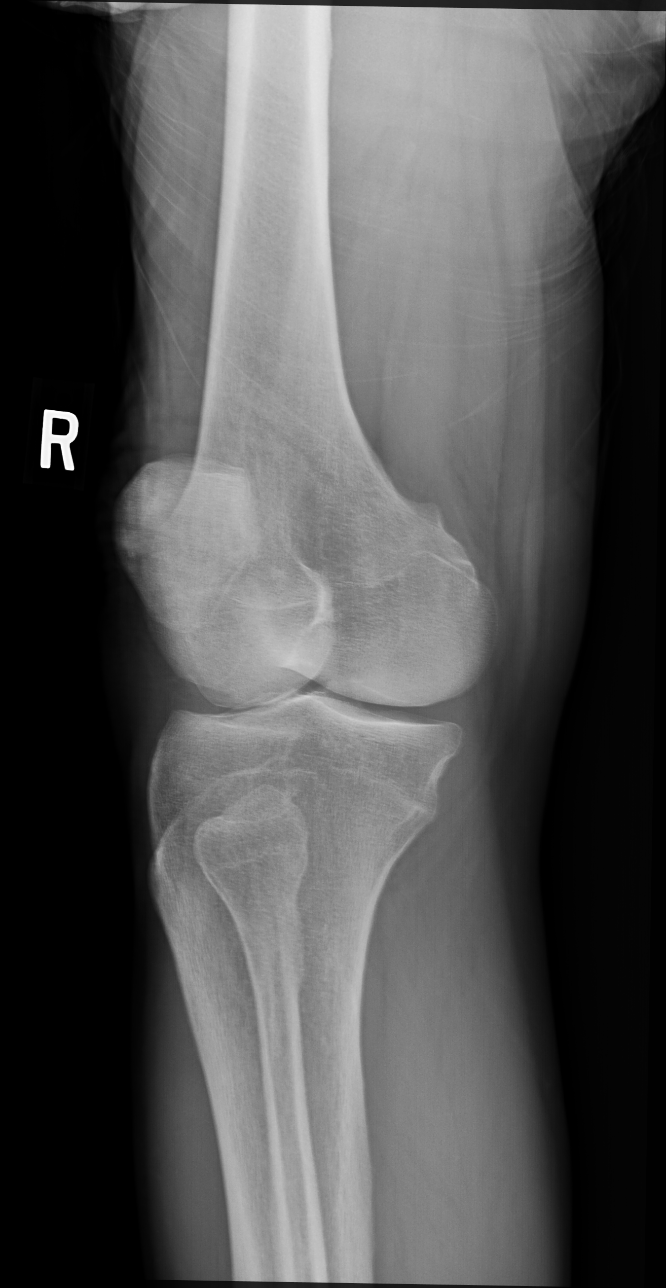

[knee standing internal ap]
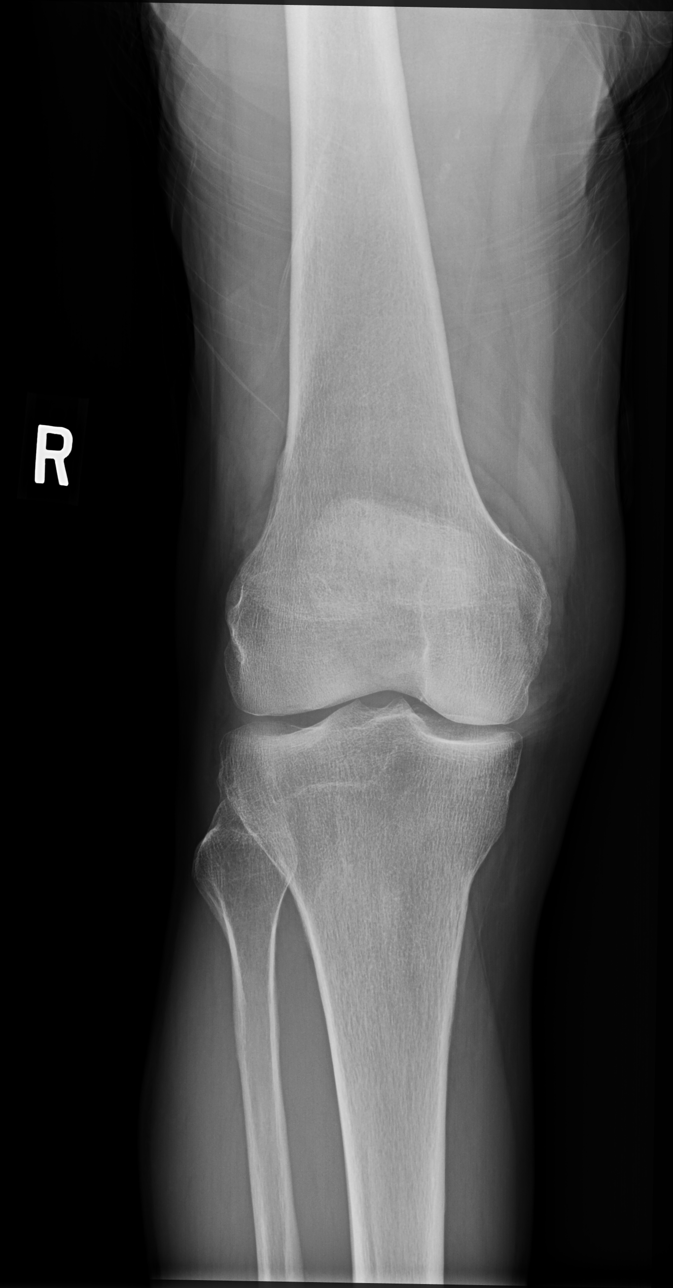

[knee standing lat]
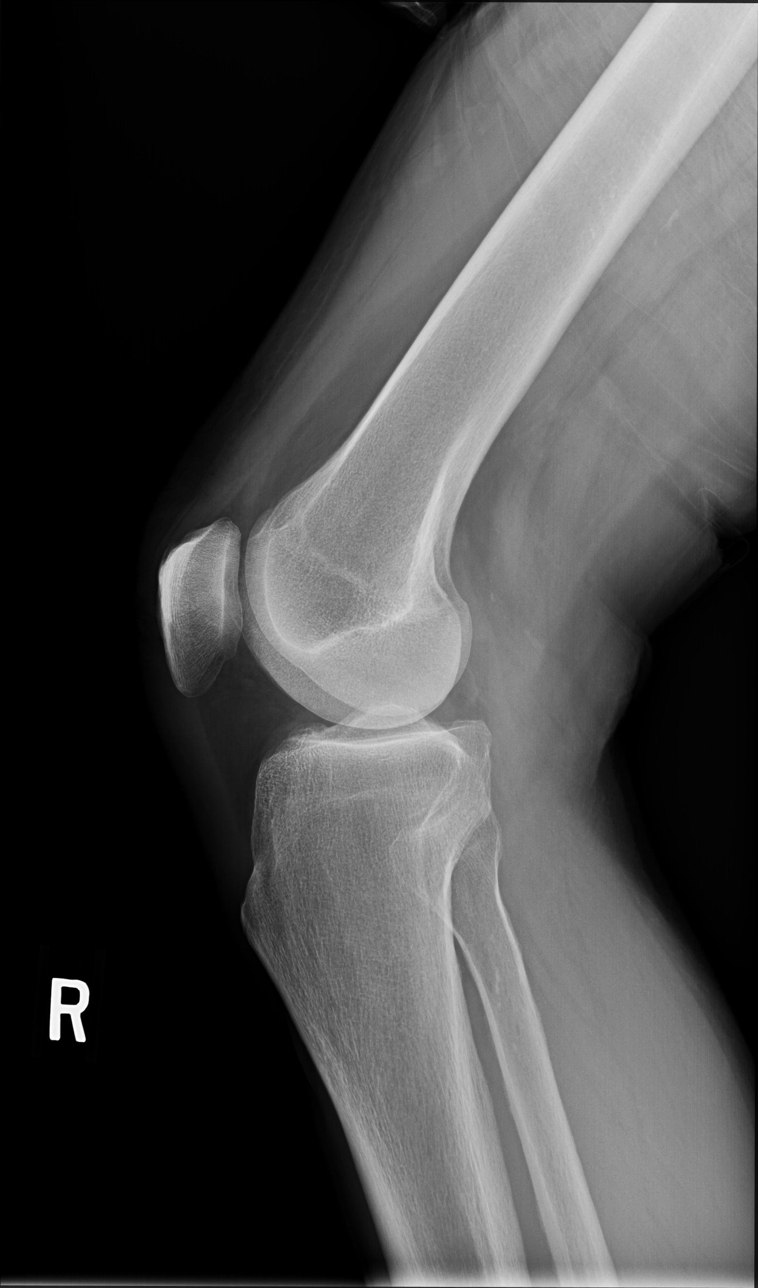

[knee [person_name] view pa]
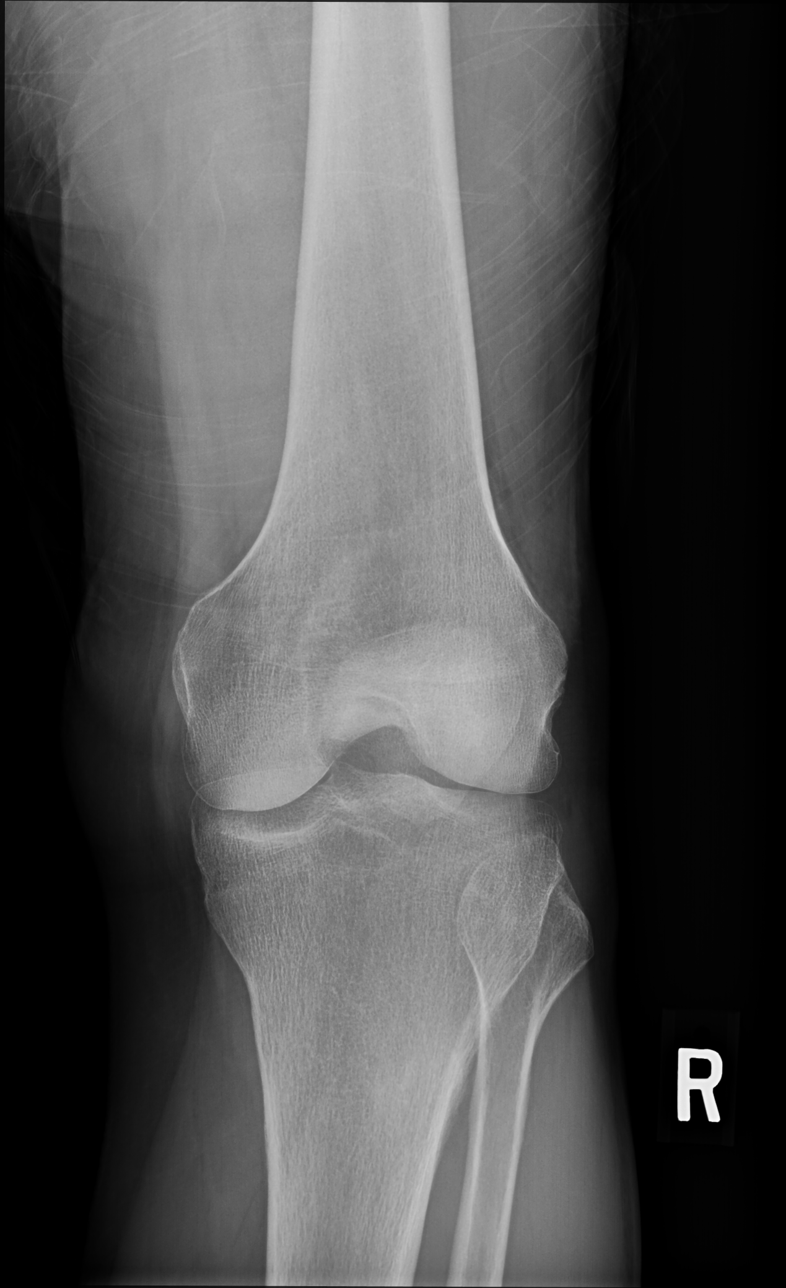

[5 of 5 positions shown; findings below may reference images not displayed]

FINDINGS: No evidence of fracture, dislocation, or joint effusion. No evidence
of arthropathy or other focal bone abnormality. Soft tissues are
unremarkable.
IMPRESSION: Negative.

## 2023-08-27 ENCOUNTER — Ambulatory Visit (INDEPENDENT_AMBULATORY_CARE_PROVIDER_SITE_OTHER): Payer: Medicare Other

## 2023-08-27 ENCOUNTER — Ambulatory Visit (INDEPENDENT_AMBULATORY_CARE_PROVIDER_SITE_OTHER): Payer: Medicare Other | Admitting: Nurse Practitioner

## 2023-08-27 ENCOUNTER — Ambulatory Visit
Admission: RE | Admit: 2023-08-27 | Discharge: 2023-08-27 | Disposition: A | Discharge status: Home / Self Care | Source: Ambulatory Visit | Attending: Internal Medicine | Admitting: Internal Medicine

## 2023-08-27 ENCOUNTER — Encounter (INDEPENDENT_AMBULATORY_CARE_PROVIDER_SITE_OTHER): Payer: Self-pay | Admitting: Nurse Practitioner

## 2023-08-27 VITALS — BP 124/80 | HR 74 | Resp 16 | Wt 166.4 lb

## 2023-08-27 DIAGNOSIS — I10 Essential (primary) hypertension: Secondary | ICD-10-CM | POA: Diagnosis not present

## 2023-08-27 DIAGNOSIS — E279 Disorder of adrenal gland, unspecified: Secondary | ICD-10-CM

## 2023-08-27 DIAGNOSIS — D3502 Benign neoplasm of left adrenal gland: Secondary | ICD-10-CM | POA: Insufficient documentation

## 2023-08-27 DIAGNOSIS — I739 Peripheral vascular disease, unspecified: Secondary | ICD-10-CM | POA: Diagnosis not present

## 2023-08-27 DIAGNOSIS — R410 Disorientation, unspecified: Secondary | ICD-10-CM | POA: Diagnosis not present

## 2023-08-27 DIAGNOSIS — D3501 Benign neoplasm of right adrenal gland: Secondary | ICD-10-CM | POA: Insufficient documentation

## 2023-08-27 DIAGNOSIS — M7989 Other specified soft tissue disorders: Secondary | ICD-10-CM | POA: Diagnosis not present

## 2023-08-27 DIAGNOSIS — N281 Cyst of kidney, acquired: Secondary | ICD-10-CM | POA: Diagnosis not present

## 2023-08-27 MED ORDER — IOHEXOL 300 MG/ML  SOLN
100.0000 mL | Freq: Once | INTRAMUSCULAR | Status: AC | PRN
  Administered 2023-08-27: 100 mL via INTRAVENOUS

## 2023-08-29 ENCOUNTER — Encounter (INDEPENDENT_AMBULATORY_CARE_PROVIDER_SITE_OTHER): Payer: Self-pay | Admitting: Nurse Practitioner

## 2023-08-29 NOTE — Progress Notes (Signed)
 Subjective:    Patient ID: Karl Myers, male    DOB: [birth year], 78 y.o.   MRN: [id number] Chief Complaint  Patient presents with   Follow-up    Pt conv ble venous reflux    The patient is a 77 year old male who presents today for follow-up regarding lower extremity edema and fusion.  The patient's wife was rightfully concerned that his carotids may have worsened given his recent episodes of confusion.  Today he has very minimal plaque noted bilaterally.  No evidence of any significant vertebral stenosis or subclavian steal.  In addition the patient had some lower extremity edema.  He notes that the edema is sporadic and not always constant day-to-day.  Today no evidence of edema is noted.  Today noninvasive study showed no evidence of DVT or superficial thrombophlebitis bilaterally.  No evidence of deep venous insufficiency or superficial venous reflux bilaterally.    Review of Systems  Cardiovascular:  Positive for leg swelling.  Psychiatric/Behavioral:  Positive for confusion.   All other systems reviewed and are negative.      Objective:   Physical Exam Vitals reviewed.  HENT:     Head: Normocephalic.  Cardiovascular:     Rate and Rhythm: Normal rate.     Pulses: Normal pulses.  Pulmonary:     Effort: Pulmonary effort is normal.  Musculoskeletal:     Right lower leg: No edema.     Left lower leg: No edema.  Skin:    General: Skin is warm and dry.  Neurological:     Mental Status: He is alert and oriented to person, place, and time.  Psychiatric:        Mood and Affect: Mood normal.        Behavior: Behavior normal.        Thought Content: Thought content normal.        Judgment: Judgment normal.     BP 124/80   Pulse 74   Resp 16   Wt 166 lb 6.4 oz (75.5 kg)   BMI 26.06 kg/m   Past Medical History:  Diagnosis Date   Actinic keratosis    Arthritis    COPD (chronic obstructive pulmonary disease) (HCC)    DDD (degenerative disc disease), cervical     DDD (degenerative disc disease), cervical    Depression    History of COVID-19    Hypertension    Melanoma (HCC) 09/04/2019   R 5th toe lateral tip, Stage IV, 0.34mm Breslow's   Pre-diabetes     Social History   Socioeconomic History   Marital status: Married    Spouse name: Not on file   Number of children: Not on file   Years of education: Not on file   Highest education level: Not on file  Occupational History   Not on file  Tobacco Use   Smoking status: Every Day    Current packs/day: 0.25    Average packs/day: 0.3 packs/day for 65.0 years (16.3 ttl pk-yrs)    Types: Cigars, Cigarettes   Smokeless tobacco: Never   Tobacco comments:    0.5PPD 12/07/2022  Vaping Use   Vaping status: Never Used  Substance and Sexual Activity   Alcohol use: No   Drug use: No   Sexual activity: Not Currently  Other Topics Concern   Not on file  Social History Narrative   Married   Social Drivers of Health   Financial Resource Strain: Low Risk  (03/30/2023)   Overall Physicist, medical Strain (  CARDIA)    Difficulty of Paying Living Expenses: Not hard at all  Food Insecurity: No Food Insecurity (03/30/2023)   Hunger Vital Sign    Worried About Running Out of Food in the Last Year: Never true    Ran Out of Food in the Last Year: Never true  Transportation Needs: No Transportation Needs (03/30/2023)   PRAPARE - Administrator, Civil Service (Medical): No    Lack of Transportation (Non-Medical): No  Physical Activity: Inactive (03/30/2023)   Exercise Vital Sign    Days of Exercise per Week: 0 days    Minutes of Exercise per Session: 0 min  Stress: No Stress Concern Present (03/30/2023)   [employer] of Occupational Health - Occupational Stress Questionnaire    Feeling of Stress : Only a little  Social Connections: Socially Integrated (03/30/2023)   Social Connection and Isolation Panel [NHANES]    Frequency of Communication with Friends and Family: More than three  times a week    Frequency of Social Gatherings with Friends and Family: More than three times a week    Attends Religious Services: More than 4 times per year    Active Member of [employer] or Organizations: Yes    Attends Banker Meetings: More than 4 times per year    Marital Status: Married  Catering manager Violence: Not At Risk (03/30/2023)   Humiliation, Afraid, Rape, and Kick questionnaire    Fear of Current or Ex-Partner: No    Emotionally Abused: No    Physically Abused: No    Sexually Abused: No    Past Surgical History:  Procedure Laterality Date   BACK SURGERY     x 3   CARPAL TUNNEL RELEASE Right 08/19/2017   Procedure: CARPAL TUNNEL RELEASE;  Surgeon: Kennedy Bucker, MD;  Location: [facility] OR[facility];  Service: Orthopedics;  Laterality: Right;   CATARACT EXTRACTION W/PHACO Right 11/17/2016   Procedure: CATARACT EXTRACTION PHACO AND INTRAOCULAR LENS PLACEMENT (IOC);  Surgeon: Galen Manila, MD;  Location: [facility];  Service: Ophthalmology;  Laterality: Right;  [place] 00:37 AP% 12.6 CDE 4.69 Fluid pack lot # [id number]   CATARACT EXTRACTION W/PHACO Left 12/08/2016   Procedure: CATARACT EXTRACTION PHACO AND INTRAOCULAR LENS PLACEMENT (IOC);  Surgeon: Galen Manila, MD;  Location: [facility];  Service: Ophthalmology;  Laterality: Left;  [place] 00:33 AP% 13.9 CDE 4.63 Fluid pack lot # [id number]   TOE AMPUTATION Left    ULNAR TUNNEL RELEASE Right 08/19/2017   Procedure: CUBITAL TUNNEL RELEASE;  Surgeon: Kennedy Bucker, MD;  Location: [facility];  Service: Orthopedics;  Laterality: Right;    Family History  Problem Relation Age of Onset   Heart attack Mother    Prostate cancer Father    Breast cancer Sister 101   Dementia Sister    Cancer Brother        unknown what kind    Allergies  Allergen Reactions   Hydrocodone-Acetaminophen Other (See Comments)    With large quantities "hyper"   Neurontin [Gabapentin] Other (See Comments)    With large quantities "hyper"         Latest Ref Rng & Units 06/09/2023    4:28 AM 06/08/2023    3:41 AM 05/12/2023    9:30 AM  CBC  WBC 4.0 - 10.5 K/uL 7.1  10.4  10.5   Hemoglobin 13.0 - 17.0 g/dL 41.3  24.4  01.0   Hematocrit 39.0 - 52.0 % 38.5  44.8  46.3   Platelets 150 - 400  K/uL 126  153  195       CMP     Component Value Date/Time   NA 140 08/16/2023 1038   NA 137 05/06/2014 0751   K 4.2 08/16/2023 1038   K 4.1 05/06/2014 0751   CL 103 08/16/2023 1038   CL 103 05/06/2014 0751   CO2 30 08/16/2023 1038   CO2 28 05/06/2014 0751   GLUCOSE 121 (H) 08/16/2023 1038   GLUCOSE 121 (H) 05/06/2014 0751   BUN 16 08/16/2023 1038   BUN 10 05/06/2014 0751   CREATININE 1.07 08/16/2023 1038   CREATININE 1.00 06/17/2018 1439   CALCIUM 8.9 08/16/2023 1038   CALCIUM 8.6 05/06/2014 0751   PROT 6.5 06/08/2023 0341   ALBUMIN 3.9 06/08/2023 0341   AST 23 06/08/2023 0341   ALT 17 06/08/2023 0341   ALKPHOS 76 06/08/2023 0341   BILITOT 1.0 06/08/2023 0341   GFR 66.72 08/16/2023 1038   GFRNONAA >60 06/09/2023 0428   GFRNONAA >60 05/06/2014 0751     VAS [place] ABI WITH/WO TBI Result Date: 07/16/2023  LOWER EXTREMITY DOPPLER STUDY Patient Name:  Karl Myers  Date of Exam:   07/14/2023 Medical Rec #: [id number]           Accession #:    [id number] Date of Birth: August 12, 1945           Patient Gender: M Patient Age:   28 years Exam Location:  [facility] Procedure:      VAS [place] ABI WITH/WO TBI Referring Phys: --------------------------------------------------------------------------------  Other Factors: Abnormal home health test.  Performing Technologist: Salvadore Farber RVT  Examination Guidelines: A complete evaluation includes at minimum, Doppler waveform signals and systolic blood pressure reading at the level of bilateral brachial, anterior tibial, and posterior tibial arteries, when vessel segments are accessible. Bilateral testing is considered an integral part of a complete examination. Photoelectric Plethysmograph (PPG)  waveforms and toe systolic pressure readings are included as required and additional duplex testing as needed. Limited examinations for reoccurring indications may be performed as noted.  ABI Findings: +---------+------------------+-----+---------+--------+ Right    Rt Pressure (mmHg)IndexWaveform Comment  +---------+------------------+-----+---------+--------+ Brachial 129                                      +---------+------------------+-----+---------+--------+ PTA      144               1.11 triphasic         +---------+------------------+-----+---------+--------+ DP       145               1.12 triphasic         +---------+------------------+-----+---------+--------+ Great Toe122               0.94 Normal            +---------+------------------+-----+---------+--------+ +---------+------------------+-----+---------+-------+ Left     Lt Pressure (mmHg)IndexWaveform Comment +---------+------------------+-----+---------+-------+ Brachial 130                                     +---------+------------------+-----+---------+-------+ PTA      142               1.09 triphasic        +---------+------------------+-----+---------+-------+ DP       145  1.12 triphasic        +---------+------------------+-----+---------+-------+ Great Toe121               0.93 Normal           +---------+------------------+-----+---------+-------+  Summary: Right: Resting right ankle-brachial index is within normal range. The right toe-brachial index is normal. Left: Resting left ankle-brachial index is within normal range. The left toe-brachial index is normal. *See table(s) above for measurements and observations.  Electronically signed by Festus Barren MD on 07/16/2023 at 7:01:24 AM.    Final        Assessment & Plan:   1. Leg swelling (Primary) Today noninvasive study showed no evidence of DVT or superficial phlebitis with no significant venous insufficiency.   We discussed the use of conservative therapies including medical grade compression socks, elevation and activity as ways to help with leg swelling.  We discussed a lymphedema pump however I do not feel that the patient's swelling is to the point to where that would be necessary or useful at this time.  He will follow-up if the swelling worsens and is not controlled with conservative measures.  2. Confusion Today the patient had no evidence of significant carotid artery disease bilaterally.  Patient will follow up as needed.  3. Primary hypertension Continue antihypertensive medications as already ordered, these medications have been reviewed and there are no changes at this time.  4. PAD (peripheral artery disease) (HCC) Patient's ABIs were normal.  No further workup necessary intervention   Current Outpatient Medications on File Prior to Visit  Medication Sig Dispense Refill   ALPRAZolam (XANAX) 0.5 MG tablet TAKE ONE TABLET AT BEDTIME AS NEEDED FORANXIETY 30 tablet 0   apixaban (ELIQUIS) 5 MG TABS tablet TAKE ONE TABLET BY MOUTH TWICE DAILY 180 tablet 1   atenolol (TENORMIN) 25 MG tablet Take 1 tablet (25 mg total) by mouth at bedtime. 90 tablet 3   atorvastatin (LIPITOR) 80 MG tablet TAKE 1 TABLET BY MOUTH DAILY 90 tablet 1   Cholecalciferol (VITAMIN D3) 25 MCG (1000 UT) CAPS Take by mouth.     cyanocobalamin (VITAMIN B12) 1000 MCG tablet Take 1 tablet (1,000 mcg total) by mouth daily. 90 tablet 1   fluticasone furoate-vilanterol (BREO ELLIPTA) 200-25 MCG/ACT AEPB Inhale into the lungs.     Fluticasone-Umeclidin-Vilant (TRELEGY ELLIPTA) 100-62.5-25 MCG/ACT AEPB Inhale 1 puff into the lungs daily. 28 each 2   gabapentin (NEURONTIN) 100 MG capsule TAKE 1 CAPSULE BY MOUTH 3 TIMES DAILY. (Patient taking differently: Take 100 mg by mouth at bedtime.) 270 capsule 1   lisinopril (ZESTRIL) 10 MG tablet Take by mouth.     meloxicam (MOBIC) 15 MG tablet Take 15 mg by mouth daily.     methocarbamol  (ROBAXIN) 500 MG tablet Take 500 mg by mouth 2 (two) times daily.     nitroGLYCERIN (NITROSTAT) 0.4 MG SL tablet Place 1 tablet (0.4 mg total) under the tongue every 5 (five) minutes as needed for chest pain. For up to 3 doses per episode. 50 tablet 0   saccharomyces boulardii (FLORASTOR) 250 MG capsule Take 1 capsule (250 mg total) by mouth daily. 90 capsule 0   traMADol (ULTRAM) 50 MG tablet Take 50 mg by mouth 2 (two) times daily as needed for moderate pain (pain score 4-6) or severe pain (pain score 7-10).     traZODone (DESYREL) 150 MG tablet TAKE 1 TABLET BY MOUTH AT BEDTIME. 90 tablet 1   umeclidinium bromide (INCRUSE ELLIPTA) 62.5 MCG/ACT AEPB  Inhale into the lungs.     valsartan (DIOVAN) 320 MG tablet Take 1 tablet (320 mg total) by mouth daily. 90 tablet 1   No current facility-administered medications on file prior to visit.    There are no Patient Instructions on file for this visit. No follow-ups on file.   Georgiana Spinner, NP

## 2023-09-08 ENCOUNTER — Other Ambulatory Visit: Payer: Self-pay

## 2023-09-08 DIAGNOSIS — G479 Sleep disorder, unspecified: Secondary | ICD-10-CM

## 2023-09-08 NOTE — Telephone Encounter (Signed)
 Copied from CRM [id number]. Topic: Clinical - Medication Refill >> Sep 08, 2023  2:59 PM Sasha H wrote: Most Recent Primary Care Visit:  Provider: Earl Lagos  Department: [facility]  Visit Type: OFFICE VISIT  Date: 08/16/2023  Medication: ALPRAZolam (XANAX) 0.5 MG tablet  Has the patient contacted their pharmacy? Yes (Agent: If no, request that the patient contact the pharmacy for the refill. If patient does not wish to contact the pharmacy document the reason why and proceed with request.) (Agent: If yes, when and what did the pharmacy advise?)  Is this the correct pharmacy for this prescription? Yes If no, delete pharmacy and type the correct one.  This is the patient's preferred pharmacy:  [facility] - [place], [place] - [address] [place] [address] Phone: [phone] Fax: [phone]    Has the prescription been filled recently? Yes  Is the patient out of the medication? Yes  Has the patient been seen for an appointment in the last year OR does the patient have an upcoming appointment? Yes  Can we respond through MyChart? No  Agent: Please be advised that Rx refills may take up to 3 business days. We ask that you follow-up with your pharmacy.

## 2023-09-08 NOTE — Telephone Encounter (Addendum)
 [place] Refilled on 08/04/23 he has an appt to establish with [facility] on 09/14/23. Patient out of medication.

## 2023-09-08 NOTE — Addendum Note (Signed)
 Addended by: Dennie Bible on: 09/08/2023 04:43 PM   Modules accepted: Orders

## 2023-09-09 MED ORDER — ALPRAZOLAM 0.5 MG PO TABS: ORAL_TABLET | ORAL | 0 refills | Status: DC

## 2023-09-10 ENCOUNTER — Other Ambulatory Visit: Payer: Self-pay

## 2023-09-10 DIAGNOSIS — I10 Essential (primary) hypertension: Secondary | ICD-10-CM

## 2023-09-10 DIAGNOSIS — E785 Hyperlipidemia, unspecified: Secondary | ICD-10-CM

## 2023-09-10 MED ORDER — ATORVASTATIN CALCIUM 80 MG PO TABS: 80.0000 mg | ORAL_TABLET | Freq: Every day | ORAL | 3 refills | Status: DC

## 2023-09-10 MED ORDER — VALSARTAN 320 MG PO TABS: 320.0000 mg | ORAL_TABLET | Freq: Every day | ORAL | 3 refills | Status: DC

## 2023-09-13 ENCOUNTER — Encounter: Payer: Medicare HMO | Admitting: Family Medicine

## 2023-09-13 NOTE — Progress Notes (Unsigned)
 Established Patient Office Visit   Subjective  Patient ID: Karl Myers, male    DOB: 1945-09-28  Age: 78 y.o. MRN: [id number]  No chief complaint on file.   He  has a past medical history of Actinic keratosis, Arthritis, COPD (chronic obstructive pulmonary disease) (HCC), DDD (degenerative disc disease), cervical, DDD (degenerative disc disease), cervical, Depression, History of COVID-19, Hypertension, Melanoma (HCC) (09/04/2019), and Pre-diabetes.  HPI Previous patient of Dr. Birdie Sons presenting to establish care.   1) Office visit for follow up on Pneumonia on 06/15/23: Positive for COVID-19. Treated with Paxlovid, azithromycin and Augmentin at [facility]. Had vomiting when on Augmentin, with concern for aspiration pneumonia he was treated with 7-day course of Metronidazole.   2) AKI: OV for hospital follow up with Dr. Heide Spark on 08/16/23 for AKI after he was seen at [facility]. Baseline Cr 0.9, was elevated ot 1.3. Was suspected to be from prerenal cause.   3) Bilateral benign adrenal adenomas: Patient was found to have b/l adrenal nodules at [facility] on a CT abdomen/pelvis. CT adrenal abdomen was ordered by Dr. Heide Spark on 3/10 visit. Patient had CT abdomen on 08/27/23, result shows:  -Bilateral benign adrenal adenomas, measuring 19 mm on the left and 15 mm on the right. No follow-up is recommended  4) BMP 08/16/23: Cr 1.07, GFR 66.72, BG 121.   5) Essential hypertension: Goal <130/80 mmHg. On Valsartan 320 mg and Atenolol 25 mg. Why not hydrochlorothiazide, CCB??  6) Insomnia: On Trazodone 150 mg.   7) Depression:   8) COPD: Following up with Pulmonology? Was listed to be on Incruse Ellipta/LAMA.  Vs Trelegy Ellipta (ICS, LAMA, LABA)??   9) PAD: Per medical record review from Dr. Purvis Sheffield note from 04/09/23, "PAD on home screening with his insurance company. ABI score of 0.77 on the left side and 0.49 on the right side. Was seen by vascular surgery on 07/14/23  where patient was found to have normal arterial perfusion and no further intervention was recommended. Was found to have leg swelling, was recommended zippered compression and f/u for b/l venous reflux study. There was some concern for confusion as well and vascular surgery plans on doing carotid duplex during follow up.   10) Lumbar radiculopathy: Had epidural steroid injection to R L4-5, S1 on 07/30/23 at [facility] clinic by Dr. Merri Ray. He already has a h/o lumbar spine surgery x 3. He has also seen pain and spine clinic/[facility] pain clinic with Dr. Shireen Quan and had morphine pump implanted and removed years ago.   11) On Xanax 0.5 mg: Last sent by Dr. Darrick Huntsman on 09/09/2022.  12) Chronic macrocytosis: Has seen heme/onc Dr.  Cathie Hoops, on B12 1000 mcg 2-3 times per week.   13) Smoking: Declined Chantix  14) Coronary artery disease: on previous CT scan, ON Atorvastatin 80 mg.   15) A fib: On Eliquis   ROS As per HPI    Objective:     There were no vitals taken for this visit. {Vitals History (Optional):23777}    08/16/2023   10:00 AM 04/30/2023    9:50 AM 04/09/2023   10:34 AM  PHQ9 SCORE ONLY  PHQ-9 Total Score 6 5 0      08/16/2023   10:03 AM 04/30/2023    9:51 AM 04/09/2023   10:34 AM 03/15/2023    8:10 AM  GAD 7 : Generalized Anxiety Score  Nervous, Anxious, on Edge 0 2 0 0  Control/stop worrying 0 0 0 0  Worry  too much - different things 0 0 0 0  Trouble relaxing 0 0 0 0  Restless 0 0 0 0  Easily annoyed or irritable 0 0 0 0  Afraid - awful might happen 0 0 0 0  Total GAD 7 Score 0 2 0 0  Anxiety Difficulty Not difficult at all Not difficult at all Not difficult at all Not difficult at all     Physical Exam   No results found for any visits on 09/14/23.  The ASCVD Risk score (Arnett DK, et al., 2019) failed to calculate for the following reasons:   Risk score cannot be calculated because patient has a medical history suggesting prior/existing ASCVD    Assessment &  Plan:  Hyperlipidemia, unspecified hyperlipidemia type  Primary hypertension  Chronic fatigue    No follow-ups on file.   Skip Estimable, MD

## 2023-09-14 ENCOUNTER — Ambulatory Visit (INDEPENDENT_AMBULATORY_CARE_PROVIDER_SITE_OTHER)

## 2023-09-14 DIAGNOSIS — Z720 Tobacco use: Secondary | ICD-10-CM

## 2023-09-14 DIAGNOSIS — F0390 Unspecified dementia without behavioral disturbance: Secondary | ICD-10-CM | POA: Insufficient documentation

## 2023-09-14 DIAGNOSIS — G548 Other nerve root and plexus disorders: Secondary | ICD-10-CM

## 2023-09-14 DIAGNOSIS — R52 Pain, unspecified: Secondary | ICD-10-CM

## 2023-09-14 DIAGNOSIS — D7589 Other specified diseases of blood and blood-forming organs: Secondary | ICD-10-CM

## 2023-09-14 DIAGNOSIS — E785 Hyperlipidemia, unspecified: Secondary | ICD-10-CM

## 2023-09-14 DIAGNOSIS — E279 Disorder of adrenal gland, unspecified: Secondary | ICD-10-CM | POA: Diagnosis not present

## 2023-09-14 DIAGNOSIS — J449 Chronic obstructive pulmonary disease, unspecified: Secondary | ICD-10-CM

## 2023-09-14 DIAGNOSIS — I4891 Unspecified atrial fibrillation: Secondary | ICD-10-CM

## 2023-09-14 DIAGNOSIS — I10 Essential (primary) hypertension: Secondary | ICD-10-CM

## 2023-09-14 DIAGNOSIS — G309 Alzheimer's disease, unspecified: Secondary | ICD-10-CM | POA: Insufficient documentation

## 2023-09-14 DIAGNOSIS — F17209 Nicotine dependence, unspecified, with unspecified nicotine-induced disorders: Secondary | ICD-10-CM

## 2023-09-14 DIAGNOSIS — F419 Anxiety disorder, unspecified: Secondary | ICD-10-CM

## 2023-09-14 DIAGNOSIS — R5382 Chronic fatigue, unspecified: Secondary | ICD-10-CM

## 2023-09-14 MED ORDER — ALPRAZOLAM 0.25 MG PO TABS: 0.2500 mg | ORAL_TABLET | Freq: Every day | ORAL | 0 refills | Status: DC

## 2023-09-14 NOTE — Assessment & Plan Note (Signed)
 Currently on Valsartan 320 mg, Atenolol 25 mg daily. BP within goal today. Patient is also listed to have Lisinopril 10 mg listed in his medication list and patient reports he has been taking this medication. However last refill for Lisinopril was done in 2019. I recommend patient and his wife bring his home medications for his next office visit for [place] to review. Continue current medication, no refill at this time.

## 2023-09-14 NOTE — Assessment & Plan Note (Signed)
 Result of CT/abdomen dating 08/27/23 discussed with patient and his wife:  Bilateral benign adrenal adenomas, measuring 19 mm on the left and 15 mm on the right. No follow-up is recommended.

## 2023-09-14 NOTE — Assessment & Plan Note (Addendum)
 PDMP reviewed:  Last Alprazolam 30 tab refilled on 09/09/23.  Last Gabapentin 100 mg, 90 days supply filled on 07/13/23.    Reports anxiety, insomnia stable on Xanax 0.5 mg and Trazodone 150 mg. Patient and his wife was counseled potential risks of taking prolong Benzodiazepines including risk of dementia, aspiration pneumonia, fall. Plan is to taper him off Xanax. I also counseled the patient on potential risks of tapering him too fast from Xanax.  PDMP reviewed:  Last Alprazolam 30 tab refilled on 09/09/23.  Last Gabapentin 100 mg, 90 days supply filled on 07/13/23.   Goal:  Start him on Xanax 0.25 mg, one tab at night once he runs out of current prescription. I sent a refill for Xanax 0.25 mg with note to pharmacy to not to refill it till 10/07/23.

## 2023-09-14 NOTE — Assessment & Plan Note (Addendum)
 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

## 2023-09-14 NOTE — Assessment & Plan Note (Signed)
 Management per cardiology.

## 2023-09-14 NOTE — Patient Instructions (Addendum)
 1) Follow up with your lung doctor for COPD.   2) Next time you need refill on Xanax we will be sending you 0.25 mg tablet with intention of tapering you off this medication.   3) Follow up in 1 month.

## 2023-09-14 NOTE — Assessment & Plan Note (Signed)
 On Atorvastatin 80 mg, daily, continue. Not needing refill. Repeat lipid panel ordered.  Lab Results  Component Value Date   CHOL 131 09/11/2022   HDL 46.80 09/11/2022   LDLCALC 65 09/11/2022   LDLDIRECT 61.0 08/05/2020   TRIG 95.0 09/11/2022   CHOLHDL 3 09/11/2022

## 2023-09-14 NOTE — Assessment & Plan Note (Addendum)
 Brief counseling  on smoking cessation provided during today's visit. Patient declines pharmacological intervention. Reports he is aware of potential health consequences related to continuing to smoke cigar and would like to continue to do so.   Reviewed result from 03/26/23: Last CT lung cancer screening: RADS 2. Recommend annual screening with low dose CT after 03/25/24.

## 2023-09-14 NOTE — Assessment & Plan Note (Signed)
 Offered referral to pulmonology, provided smoking cessation counseling.  Patient reports he is already established with pulmonology and his wife confirms this as well.  They are going to call pulmonology office to schedule a follow-up appointment.  If patient reaches out requesting pulmonology referral we will go ahead and do that.

## 2023-09-22 DIAGNOSIS — M25561 Pain in right knee: Secondary | ICD-10-CM | POA: Diagnosis not present

## 2023-09-30 ENCOUNTER — Telehealth: Payer: Self-pay

## 2023-09-30 ENCOUNTER — Ambulatory Visit

## 2023-09-30 DIAGNOSIS — Z8673 Personal history of transient ischemic attack (TIA), and cerebral infarction without residual deficits: Secondary | ICD-10-CM | POA: Diagnosis not present

## 2023-09-30 DIAGNOSIS — Z8616 Personal history of COVID-19: Secondary | ICD-10-CM | POA: Diagnosis not present

## 2023-09-30 DIAGNOSIS — I2510 Atherosclerotic heart disease of native coronary artery without angina pectoris: Secondary | ICD-10-CM | POA: Diagnosis not present

## 2023-09-30 DIAGNOSIS — I4891 Unspecified atrial fibrillation: Secondary | ICD-10-CM | POA: Diagnosis not present

## 2023-09-30 DIAGNOSIS — I729 Aneurysm of unspecified site: Secondary | ICD-10-CM | POA: Diagnosis not present

## 2023-09-30 DIAGNOSIS — Z1152 Encounter for screening for COVID-19: Secondary | ICD-10-CM | POA: Diagnosis not present

## 2023-09-30 DIAGNOSIS — Z7951 Long term (current) use of inhaled steroids: Secondary | ICD-10-CM | POA: Diagnosis not present

## 2023-09-30 DIAGNOSIS — Z79899 Other long term (current) drug therapy: Secondary | ICD-10-CM | POA: Diagnosis not present

## 2023-09-30 DIAGNOSIS — I10 Essential (primary) hypertension: Secondary | ICD-10-CM | POA: Diagnosis not present

## 2023-09-30 DIAGNOSIS — J449 Chronic obstructive pulmonary disease, unspecified: Secondary | ICD-10-CM | POA: Diagnosis not present

## 2023-09-30 DIAGNOSIS — E785 Hyperlipidemia, unspecified: Secondary | ICD-10-CM | POA: Diagnosis not present

## 2023-09-30 DIAGNOSIS — Z7409 Other reduced mobility: Secondary | ICD-10-CM | POA: Diagnosis not present

## 2023-09-30 DIAGNOSIS — R269 Unspecified abnormalities of gait and mobility: Secondary | ICD-10-CM | POA: Diagnosis not present

## 2023-09-30 DIAGNOSIS — F419 Anxiety disorder, unspecified: Secondary | ICD-10-CM | POA: Diagnosis not present

## 2023-09-30 DIAGNOSIS — F0394 Unspecified dementia, unspecified severity, with anxiety: Secondary | ICD-10-CM | POA: Diagnosis not present

## 2023-09-30 DIAGNOSIS — R7303 Prediabetes: Secondary | ICD-10-CM | POA: Diagnosis not present

## 2023-09-30 DIAGNOSIS — R410 Disorientation, unspecified: Secondary | ICD-10-CM | POA: Diagnosis not present

## 2023-09-30 DIAGNOSIS — R4182 Altered mental status, unspecified: Secondary | ICD-10-CM | POA: Diagnosis not present

## 2023-09-30 DIAGNOSIS — D6859 Other primary thrombophilia: Secondary | ICD-10-CM | POA: Diagnosis not present

## 2023-09-30 DIAGNOSIS — G319 Degenerative disease of nervous system, unspecified: Secondary | ICD-10-CM | POA: Diagnosis not present

## 2023-09-30 DIAGNOSIS — I671 Cerebral aneurysm, nonruptured: Secondary | ICD-10-CM | POA: Diagnosis not present

## 2023-09-30 DIAGNOSIS — F17210 Nicotine dependence, cigarettes, uncomplicated: Secondary | ICD-10-CM | POA: Diagnosis not present

## 2023-09-30 DIAGNOSIS — F0390 Unspecified dementia without behavioral disturbance: Secondary | ICD-10-CM | POA: Diagnosis not present

## 2023-09-30 DIAGNOSIS — Z66 Do not resuscitate: Secondary | ICD-10-CM | POA: Diagnosis not present

## 2023-09-30 DIAGNOSIS — Z7901 Long term (current) use of anticoagulants: Secondary | ICD-10-CM | POA: Diagnosis not present

## 2023-09-30 DIAGNOSIS — Z85820 Personal history of malignant melanoma of skin: Secondary | ICD-10-CM | POA: Diagnosis not present

## 2023-09-30 NOTE — Telephone Encounter (Signed)
 Pt has been checked in at [facility]

## 2023-09-30 NOTE — Telephone Encounter (Signed)
 Spoke to pt's wife. Wife stated that pt was experiencing hallucinations, confusion, loss of sleep, headache and high bp. Asked wife how long symptoms had been going on she stated 3 days. Wife stated his bp was 165/78 heart rate 77 and o2 was 99. Advise wife that pt needs to be seen in ED due to his symptoms and the severity of them. Wife stated that they would try to go to [facility] for eval..

## 2023-09-30 NOTE — Telephone Encounter (Signed)
 Havent received any faxes as of yet

## 2023-09-30 NOTE — Telephone Encounter (Signed)
 Copied from CRM [id number]. Topic: Referral - Question >> Sep 30, 2023  8:31 AM Ovid Blow wrote: Reason for CRM: patient stated that [facility] research needs moca and frequently asked questions to be faxed over also the referral letter so that patient can be seen. Fax: [phone]

## 2023-09-30 NOTE — Telephone Encounter (Signed)
 Noted. Thank you for the update.   Jacklin Mascot, MD

## 2023-10-01 DIAGNOSIS — I671 Cerebral aneurysm, nonruptured: Secondary | ICD-10-CM | POA: Diagnosis not present

## 2023-10-01 DIAGNOSIS — I10 Essential (primary) hypertension: Secondary | ICD-10-CM | POA: Diagnosis not present

## 2023-10-01 DIAGNOSIS — G319 Degenerative disease of nervous system, unspecified: Secondary | ICD-10-CM | POA: Diagnosis not present

## 2023-10-01 DIAGNOSIS — R4182 Altered mental status, unspecified: Secondary | ICD-10-CM | POA: Diagnosis not present

## 2023-10-01 DIAGNOSIS — E785 Hyperlipidemia, unspecified: Secondary | ICD-10-CM | POA: Diagnosis not present

## 2023-10-01 DIAGNOSIS — I4891 Unspecified atrial fibrillation: Secondary | ICD-10-CM | POA: Diagnosis not present

## 2023-10-02 DIAGNOSIS — I4891 Unspecified atrial fibrillation: Secondary | ICD-10-CM | POA: Diagnosis not present

## 2023-10-02 DIAGNOSIS — F419 Anxiety disorder, unspecified: Secondary | ICD-10-CM | POA: Diagnosis not present

## 2023-10-02 DIAGNOSIS — I10 Essential (primary) hypertension: Secondary | ICD-10-CM | POA: Diagnosis not present

## 2023-10-02 DIAGNOSIS — R4182 Altered mental status, unspecified: Secondary | ICD-10-CM | POA: Diagnosis not present

## 2023-10-04 ENCOUNTER — Telehealth: Payer: Self-pay

## 2023-10-04 NOTE — Transitions of Care (Post Inpatient/ED Visit) (Signed)
 10/04/2023  Name: Karl Myers MRN: [id number] DOB: 10/05/1945  Today's TOC FU Call Status: Today's TOC FU Call Status:: Successful TOC FU Call Completed TOC FU Call Complete Date: 10/04/23 Patient's Name and Date of Birth confirmed.  Transition Care Management Follow-up Telephone Call Date of Discharge: 10/02/23 Discharge Facility: Other [facility]) Name of Other (Non[facility]) Discharge Facility: [facility] Type of Discharge: Inpatient Admission Primary Inpatient Discharge Diagnosis:: Altered Mental Status How have you been since you were released from the hospital?: Better Any questions or concerns?: Yes Patient Questions/Concerns:: Medications Patient Questions/Concerns Addressed: Other: (RNCM reconciled the meds)  Items Reviewed: Did you receive and understand the discharge instructions provided?: Yes Medications obtained,verified, and reconciled?: Yes (Medications Reviewed) Any new allergies since your discharge?: No Dietary orders reviewed?: Yes Type of Diet Ordered:: Low sodium Heart Healthy Do you have support at home?: Yes People in Home [RPT]: spouse Name of Support/Comfort Primary Source: Avante Wittkopp  Medications Reviewed Today: Medications Reviewed Today   Medications were not reviewed in this encounter     Home Care and Equipment/Supplies: Were Home Health Services Ordered?: No Any new equipment or medical supplies ordered?: No  Functional Questionnaire: Do you need assistance with bathing/showering or dressing?: No Do you need assistance with meal preparation?: Yes (The Spouse provides meal preperation) Do you need assistance with eating?: No Do you have difficulty maintaining continence: Yes (Has occasional periods of incontinence) Do you need assistance with getting out of bed/getting out of a chair/moving?: No Do you have difficulty managing or taking your medications?: Yes (The spouce provides assistance)  Follow up appointments  reviewed: PCP Follow-up appointment confirmed?: Yes Date of PCP follow-up appointment?: 10/05/23 [facility]up [facility] Follow-up appointment confirmed?: NA Do you need transportation to your follow-up appointment?: No Do you understand care options if your condition(s) worsen?: Yes-patient verbalized understanding  SDOH Interventions Today    Flowsheet Row Most Recent Value  SDOH Interventions   Food Insecurity Interventions Intervention Not Indicated  Housing Interventions Intervention Not Indicated  Transportation Interventions Intervention Not Indicated  Utilities Interventions Intervention Not Indicated       Goals Addressed             This Visit's Progress    VBCI Transitions of Care (TOC) Care Plan       Problems:  Recent Hospitalization for treatment of Dementia Limited social support: The spouse is the primary support and this is a new diagnosis for the patient   Goal:  Over the next 30 days, the patient will not experience hospital readmission  Interventions:   Dementia: Evaluation of current treatment plan related to misuse of: Alzheimer's dementia Emotional Support Provided to patient/caregiver, Sleep assessment completed, Sleep hygiene recommendations and education provided, Consideration of in-home help encouraged , Discussed Health Care Power of [place] , and Advised to contact provider for new or worsening symptoms  Patient Self Care Activities:  Attend all scheduled provider appointments Call pharmacy for medication refills 3-7 days in advance of running out of medications Call provider office for new concerns or questions  Notify RN Care Manager of [facility] call rescheduling needs Participate in Transition of Care Program/Attend TOC scheduled calls Take medications as prescribed    Plan:  Telephone follow up appointment with care management team member scheduled for:  Tuesday May 6th at 1:00pm        The patient has a  new diagnosis of dementia. The family requires support and education with each visit Karl Myers  Karl Myers, BSN, RN [employer]  VBCI - Population Health RN Care Manager [phone]

## 2023-10-04 NOTE — Patient Instructions (Signed)
 Visit Information  Thank you for taking time to visit with me today. Please don't hesitate to contact me if I can be of assistance to you before our next scheduled telephone appointment.  Our next appointment is by telephone on May 6th at 1:00pm  Following is a copy of your care plan:   Goals Addressed             This Visit's Progress    VBCI Transitions of Care (TOC) Care Plan       Problems:  Recent Hospitalization for treatment of Dementia Limited social support: The spouse is the primary support and this is a new diagnosis for the patient   Goal:  Over the next 30 days, the patient will not experience hospital readmission  Interventions:   Dementia: Evaluation of current treatment plan related to misuse of: Alzheimer's dementia Emotional Support Provided to patient/caregiver, Sleep assessment completed, Sleep hygiene recommendations and education provided, Consideration of in-home help encouraged , Discussed Health Care Power of Attorney , and Advised to contact provider for new or worsening symptoms  Patient Self Care Activities:  Attend all scheduled provider appointments Call pharmacy for medication refills 3-7 days in advance of running out of medications Call provider office for new concerns or questions  Notify RN Care Manager of [facility] call rescheduling needs Participate in Transition of Care Program/Attend TOC scheduled calls Take medications as prescribed    Plan:  Telephone follow up appointment with care management team member scheduled for:  Tuesday May 6th at 1:00pm        The patient has been provided with contact information for the care management team and has been advised to call with any health related questions or concerns.   Please call the care guide team at [phone] if you need to cancel or reschedule your appointment.   Please call the Suicide and Crisis Lifeline: 988 call the [place]  National Suicide Prevention Lifeline: 608-135-8091 or TTY:  225 197 3196 TTY 712 597 2169) to talk to a trained counselor if you are experiencing a Mental Health or Behavioral Health Crisis or need someone to talk to.  Gareld June, BSN, RN [place]  VBCI - [employer] [facility] RN Care Manager [phone]

## 2023-10-05 ENCOUNTER — Ambulatory Visit

## 2023-10-06 ENCOUNTER — Ambulatory Visit

## 2023-10-07 ENCOUNTER — Ambulatory Visit (INDEPENDENT_AMBULATORY_CARE_PROVIDER_SITE_OTHER)

## 2023-10-07 VITALS — BP 134/78 | HR 66 | Temp 97.7°F | Ht 67.0 in | Wt 157.8 lb

## 2023-10-07 DIAGNOSIS — I671 Cerebral aneurysm, nonruptured: Secondary | ICD-10-CM | POA: Diagnosis not present

## 2023-10-07 DIAGNOSIS — G4700 Insomnia, unspecified: Secondary | ICD-10-CM | POA: Insufficient documentation

## 2023-10-07 DIAGNOSIS — F0390 Unspecified dementia without behavioral disturbance: Secondary | ICD-10-CM

## 2023-10-07 DIAGNOSIS — Z716 Tobacco abuse counseling: Secondary | ICD-10-CM | POA: Insufficient documentation

## 2023-10-07 DIAGNOSIS — J189 Pneumonia, unspecified organism: Secondary | ICD-10-CM

## 2023-10-07 DIAGNOSIS — I6381 Other cerebral infarction due to occlusion or stenosis of small artery: Secondary | ICD-10-CM | POA: Insufficient documentation

## 2023-10-07 DIAGNOSIS — R790 Abnormal level of blood mineral: Secondary | ICD-10-CM | POA: Diagnosis not present

## 2023-10-07 DIAGNOSIS — F17209 Nicotine dependence, unspecified, with unspecified nicotine-induced disorders: Secondary | ICD-10-CM | POA: Diagnosis not present

## 2023-10-07 MED ORDER — MAGNESIUM OXIDE -MG SUPPLEMENT 400 (240 MG) MG PO TABS: 200.0000 mg | ORAL_TABLET | Freq: Every day | ORAL | Status: AC

## 2023-10-07 NOTE — Assessment & Plan Note (Signed)
 Take Magnesium  oxide 200 mg once a day. Medication sent to the pharmacy. Repeat Magnesium  before next appointment, lab ordered.

## 2023-10-07 NOTE — Assessment & Plan Note (Signed)
 Likely multifactorial, Minicog score 4. Recommend referral to neurology. Patient agreeable.   We also went through patient's medications, risks of driving with dementia, safety at home specially given patient is on Apixaban .   Recommend close f/u within next 2 weeks. DMV form was filled to request patient goes through driving test and refrain from driving for the time he waits to have driving evaluation done.

## 2023-10-07 NOTE — Telephone Encounter (Signed)
 Patient was seen in office today with provider Dr Casimir Cleaver and still did not receive any of the requested paperwork.

## 2023-10-07 NOTE — Assessment & Plan Note (Addendum)
 Has been treated with multiple medications including Benzodiazepines in the past with s/e recommend d/c the following:  Robaxin, Tramadol , Robitussin, and Trazodone , gabapentin , and alprazolam  Sleep hygiene discussed.  Take Melatonin around 7 PM.  Recommend evaluation by sleep specialist for potentially less sedating sleep aid. Referral made.

## 2023-10-07 NOTE — Patient Instructions (Addendum)
--   Discontinue Robaxin, Tramadol , Robitussin, Trazodone , gabapentin , and alprazolam . These can cause worsening dementia, increase risk of fall.  -- Please take Magnesium  and Melatonin in the evening around 7 PM. I am also sending Hewitt Lou to sleep clinic to see if they can help with insomnia.  -- I have put a referral to neurology to evaluate for dementia and further treat it. If you do not hear from the neurology clinic with in one week please call us .  -- At some point you will need to have conversation about being safe at home. Vs need for potential assisted lived.  I Understand that you guys want to stay at home as long as possible.  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 I recommend we do speech therapy to help reduce risk of aspiration. Please cut down on smoking cigarettes. May be next time you will be smoking only 4 sticks instead of 6.  --  I will reach out to [facility] to see what the next process is to go for a driving test. Please refrain from driving till you hear back from me to be safe for you and for others.  -- Continue taking the following medications:  - B12 1,000 mcg once a day.  - Valsartan  320 mg, once a day.  - Meloxicam 15 mg as needed daily for pain.  - Atorvastain 80 mg, continue this once a day.  - Vitamin D  1,000 U, take this once a day. - Atenolol  25 mg, take this once a day.  - Apixaban  5 mg, twice a day.

## 2023-10-07 NOTE — Progress Notes (Signed)
 Established Patient Office Visit   Subjective  Patient ID: Karl Myers, male    DOB: 1946-04-04  Age: 78 y.o. MRN: [id number]  Chief Complaint  Patient presents with   Insomnia   Hospitalization Follow-up    He  has a past medical history of Actinic keratosis, Acute otitis media (05/08/2023), Arthritis, COPD (chronic obstructive pulmonary disease) (HCC), DDD (degenerative disc disease), cervical, DDD (degenerative disc disease), cervical, Dementia (HCC), Depression, History of COVID-19, Hypertension, Melanoma (HCC) (09/04/2019), and Pre-diabetes.  HPI Patient is a 78 year old male here with his wife for the following concerns:  1) Transition of care: Was hospitalized at [facility] for acute on chronic change in mental status from 09/30/23-10/02/23. Defer to documentiation from Rudell Corrigan RN from 10/04/23.  CT head w/o acute findings, notable for global cerebral atrophy, chronic small vessel ischemic disease, chronic lacunar infarct in L basal ganglia. 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 T4, B12, inflammatory markers WNL. Blood cultures NGTD.  During his hospital stay patient was recommended to discontinue Robaxin, Tramadol , Robitussin, and Trazodone , gabapentin , and alprazolam . He has not taken above medications since hospital discharge. Since hospital discharge patient has been at baseline.   2) Chronic insomnia: Currently taking Melatonin 12 mg, before bedtime. Patient goes to bed around 9 PM, he grunts, tosses and turns and is not able to sleep. Wife reports she finds him awake in different room doing different tasks in the middle of the night. He is getting about 4-5 hours of sleep per night.   3) Patient has a h/o dementia. He currently drives. Does some ADLs by himself. They have 2 kids who live within 2 miles from their house. Their daughter Sherrlyn Dolores comes to see them twice a day. She is also the one who takes care of patient's medication. Patient currently  drives and denies concern with safety from driving. Family has a tracker in patient's truck. He has gotten lost once when driving. He has not seen a neurologist in the past.   4) Current medications:  - B12 1,000 mcg.  - Valsartan  320 mg, once a day.  - Meloxicam 15 mg 30 mg.  - He has been taking Lisinopril  10 mg and Valsartan . He is not supposed to be on Lisinopril . - Atorvastain 80 mg.  - Vitamin D  1,000 U - Atenolol  25 mg, from me - Apixaban  5 mg, twice a day.   5) Continues to smoke 6 sticks of cigarette per day. Was hospitalized for pneumonia recently.    ROS As per HPI    Objective:     BP 134/78   Pulse 66   Temp 97.7 F (36.5 C) (Oral)   Ht 5\' 7"  (1.702 m)   Wt 157 lb 12.8 oz (71.6 kg)   SpO2 96%   BMI 24.71 kg/m      10/07/2023   10:00 AM 08/16/2023   10:00 AM 04/30/2023    9:50 AM  Depression screen PHQ 2/9  Decreased Interest 0 0 0  Down, Depressed, Hopeless 0 0 0  PHQ - 2 Score 0 0 0  Altered sleeping 3 0 0  Tired, decreased energy 3 3 2   Change in appetite 0 0 0  Feeling bad or failure about yourself  0 0 0  Trouble concentrating 0 0 0  Moving slowly or fidgety/restless 3 3 3   Suicidal thoughts 0 0 0  PHQ-9 Score 9 6 5   Difficult doing work/chores Not difficult at all Somewhat  difficult Not difficult at all      10/07/2023   10:00 AM 08/16/2023   10:03 AM 04/30/2023    9:51 AM 04/09/2023   10:34 AM  GAD 7 : Generalized Anxiety Score  Nervous, Anxious, on Edge 3 0 2 0  Control/stop worrying 0 0 0 0  Worry too much - different things 0 0 0 0  Trouble relaxing 1 0 0 0  Restless 3 0 0 0  Easily annoyed or irritable 1 0 0 0  Afraid - awful might happen 0 0 0 0  Total GAD 7 Score 8 0 2 0  Anxiety Difficulty Not difficult at all Not difficult at all Not difficult at all Not difficult at all    Physical Exam HENT:     Head: Normocephalic and atraumatic.  Eyes:     Conjunctiva/sclera: Conjunctivae normal.     Pupils: Pupils are equal, round, and  reactive to light.  Cardiovascular:     Rate and Rhythm: Normal rate.  Pulmonary:     Breath sounds: No wheezing.  Musculoskeletal:     Right lower leg: No edema.     Left lower leg: No edema.  Neurological:     Mental Status: He is alert and oriented to person, place, and time.     Cranial Nerves: No dysarthria or facial asymmetry.     Sensory: No sensory deficit.     Motor: Motor function is intact.  Psychiatric:        Mood and Affect: Mood normal.        No results found for any visits on 10/07/23.  The ASCVD Risk score (Arnett DK, et al., 2019) failed to calculate for the following reasons:   Risk score cannot be calculated because patient has a medical history suggesting prior/existing ASCVD    Assessment & Plan:  Dementia, unspecified dementia severity, unspecified dementia type, unspecified whether behavioral, psychotic, or mood disturbance or anxiety (HCC) Assessment & Plan: Likely multifactorial, Minicog score 4. Recommend referral to neurology. Patient agreeable.   We also went through patient's medications, risks of driving with dementia, safety at home specially given patient is on Apixaban .   Recommend close f/u within next 2 weeks. DMV form was filled to request patient goes through driving test and refrain from driving for the time he waits to have driving evaluation done.      Orders: -     Ambulatory referral to Neurology  Low blood magnesium  Assessment & Plan: Take Magnesium  oxide 200 mg once a day. Medication sent to the pharmacy. Repeat Magnesium  before next appointment, lab ordered.    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 Recommend BP, cholesterol control. Recommend referral to neurology. Patient agreeable. Counseling on smoking cessation done as well.   Orders: -     Ambulatory referral to Neurology  Insomnia, unspecified type Assessment & Plan: Has been treated  with multiple medications including Benzodiazepines in the past with s/e recommend d/c the following:  Robaxin, Tramadol , Robitussin, and Trazodone , gabapentin , and alprazolam  Sleep hygiene discussed.  Take Melatonin around 7 PM.  Recommend evaluation by sleep specialist for potentially less sedating sleep aid. Referral made.   Orders: -     Pulmonary Visit  Pneumonia of left lower lobe due to infectious organism Assessment & Plan: - Resolved, no pulmonary concerns. Given h/o dementia, aspiration concern I recommend speech therapy referral. Patient and his wife agreeable,  referral made.   Orders: -     Ambulatory referral to Speech Therapy  Tobacco use disorder, continuous Assessment & Plan: Not interested in cessation at this time. Brief counseling provided.    Tobacco abuse counseling  Left sided lacunar stroke [facility]) Assessment & Plan: MRI result from 09/30/23 discussed with the patient. Recommend neurology referral. Already on statin.     I spent more than 50 minutes in going thorough patient's medical records, hospital course form 4/24-4/26/2025, medication lists, side effects, coordinating care, referrals, discussing plans with the patient and his wife.   F/U: 10/14/23  Jacklin Mascot, MD

## 2023-10-07 NOTE — Assessment & Plan Note (Signed)
 Evident on MRI on 10/01/23. Recommend BP, cholesterol control. Recommend referral to neurology. Patient agreeable. Counseling on smoking cessation done as well.

## 2023-10-07 NOTE — Assessment & Plan Note (Signed)
-   Resolved, no pulmonary concerns. Given h/o dementia, aspiration concern I recommend speech therapy referral. Patient and his wife agreeable, referral made.

## 2023-10-07 NOTE — Assessment & Plan Note (Signed)
 MRI result from 09/30/23 discussed with the patient. Recommend neurology referral. Already on statin.

## 2023-10-07 NOTE — Assessment & Plan Note (Signed)
 Not interested in cessation at this time. Brief counseling provided.

## 2023-10-12 ENCOUNTER — Other Ambulatory Visit: Payer: Self-pay

## 2023-10-12 NOTE — Patient Instructions (Signed)
 Visit Information  Thank you for taking time to visit with me today. Please don't hesitate to contact me if I can be of assistance to you before our next scheduled telephone appointment.  Our next appointment is by telephone on Tuesday May 13th at 1:00pm  Following is a copy of your care plan:   Goals Addressed             This Visit's Progress    VBCI Transitions of Care (TOC) Care Plan   On track    Problems: (reviewed 10/12/23) Recent Hospitalization for treatment of Dementia Limited social support: The spouse is the primary support and this is a new diagnosis for the patient   Goal: (reviewed 10/12/23) Over the next 30 days, the patient will not experience hospital readmission  Interventions: (reviewed 10/12/23)  Dementia: Evaluation of current treatment plan related to misuse of: Alzheimer's dementia Emotional Support Provided to patient/caregiver, Sleep assessment completed, Sleep hygiene recommendations and education provided, Consideration of in-home help encouraged , Discussed Health Care Power of [place] , and Advised to contact provider for new or worsening symptoms Driving test scheduled for 10/12/23 to assess competency ST referral for choking episode - Rule Out Aspiration Magnesium  oxide 200mg  daily Referral to Neurology Sleep study due to insomnia  Patient Self Care Activities: (reviewed 10/12/23) Attend all scheduled provider appointments Call pharmacy for medication refills 3-7 days in advance of running out of medications Call provider office for new concerns or questions  Notify RN Care Manager of [facility] call rescheduling needs Participate in Transition of Care Program/Attend TOC scheduled calls Take medications as prescribed   Fall Precautions - The patient is on Apixaban . Educated on increased risk of bleeding  Plan:  Telephone follow up appointment with care management team member scheduled for:  Tuesday May 13th at 1:00pm        The patient has been  provided with contact information for the care management team and has been advised to call with any health related questions or concerns.   Please call the care guide team at [phone] if you need to cancel or reschedule your appointment.   Please call the Suicide and Crisis Lifeline: 988 call the [place]  National Suicide Prevention Lifeline: 539-272-7005 or TTY: (205)438-0148 TTY (925)745-2920) to talk to a trained counselor if you are experiencing a Mental Health or Behavioral Health Crisis or need someone to talk to.  Gareld June, BSN, RN [place]  VBCI - [employer] [facility] RN Care Manager [phone]

## 2023-10-12 NOTE — Transitions of Care (Post Inpatient/ED Visit) (Signed)
 Transition of Care week 2  Visit Note  10/12/2023  Name: Karl Myers MRN: [id number]          DOB: 11-Aug-1945  Situation: Patient enrolled in [facility] 30-day program. Visit completed with King Penning by telephone.   Background:     Past Medical History:  Diagnosis Date   Actinic keratosis    Acute otitis media 05/08/2023   Arthritis    COPD (chronic obstructive pulmonary disease) (HCC)    DDD (degenerative disc disease), cervical    DDD (degenerative disc disease), cervical    Dementia (HCC)    Depression    History of COVID-19    Hypertension    Melanoma (HCC) 09/04/2019   R 5th toe lateral tip, Stage IV, 0.56mm Breslow's   Pre-diabetes     Assessment: TOC Outreach completed today. The patient went to see his PCP on Oct 07 2023 for post hospital discharge follow up. The patient has a new diagnosis of dementia but he states he does not feel he has forgetfulness. He states his wife thinks he cannot drive and has problems with remembering. He does have a referral for Neurology. He states he choked on a piece of Cantaloupe and he has a referral for ST to rule out aspiration. He is having some troubles with sleeping and he has been referred for a sleep study. The provider also recommended Melatonin at night. He was started on Magnesium . He does have an appointment with [facility] to complete a driving test to make sure his new diagnosis of dementia is not impeding his driving. The patient states he has been smoking since he was a teenager and is not interested in quitting.   Patient Reported Symptoms: Cognitive Cognitive Status: Alert and oriented to person, place, and time, Requires Assistance Decision Making Cognitive/Intellectual Conditions Management [RPT]: None reported or documented in medical history or problem list      Neurological Neurological Review of Symptoms: No symptoms reported Neurological Conditions: Alzheimer's disease Neurological Management Strategies:  Coping strategies Neurological Self-Management Outcome: 3 (uncertain) Neurological Comment: Under going testing  HEENT HEENT Symptoms Reported: No symptoms reported      Cardiovascular Cardiovascular Symptoms Reported: No symptoms reported Does patient have uncontrolled Hypertension?: No Cardiovascular Conditions: High blood cholesterol, Hypertension Cardiovascular Management Strategies: Medication therapy Weight: 157 lb (71.2 kg) Cardiovascular Self-Management Outcome: 4 (good) Cardiovascular Comment: He does not check BP at home  Respiratory Respiratory Symptoms Reported: No symptoms reported    Endocrine Patient reports the following symptoms related to hypoglycemia or hyperglycemia : No symptoms reported Is patient diabetic?: No    Gastrointestinal Gastrointestinal Symptoms Reported: No symptoms reported      Genitourinary Genitourinary Symptoms Reported: Incontinence Additional Genitourinary Details: Occasional Genitourinary Conditions: Incontinence Genitourinary Self-Management Outcome: 3 (uncertain)  Integumentary Integumentary Symptoms Reported: No symptoms reported    Musculoskeletal Musculoskelatal Symptoms Reviewed: No symptoms reported   Falls in the past year?: Yes Number of falls in past year: 1 or less Was there an injury with Fall?: No Fall Risk Category Calculator: 1 Patient Fall Risk Level: Low Fall Risk Patient at Risk for Falls Due to: History of fall(s), Other (Comment) (The patient is on Apixaban . Risk for bleeding) Fall risk Follow up: Education provided  Psychosocial Psychosocial Symptoms Reported: No symptoms reported         There were no vitals filed for this visit.  Medications Reviewed Today     Reviewed by Claudene Crystal, RN (Case Manager) on 10/12/23 at 1311  Med List Status: <None>   Medication Order Taking? Sig Documenting Provider Last Dose Status Informant  apixaban  (ELIQUIS ) 5 MG TABS tablet [id number] No TAKE ONE TABLET BY MOUTH  TWICE DAILY Gollan, Timothy J, MD Taking Active Self, Spouse/Significant Other, Pharmacy Records  atenolol  (TENORMIN ) 25 MG tablet [id number] No Take 1 tablet (25 mg total) by mouth at bedtime. Kent Pear, MD Taking Active Self, Spouse/Significant Other, Pharmacy Records  atorvastatin  (LIPITOR) 80 MG tablet [id number] No Take 1 tablet (80 mg total) by mouth daily. Thersia Flax, MD Taking Active   Cholecalciferol (VITAMIN D3) 25 MCG (1000 UT) CAPS [id number] No Take by mouth. [provider] Taking Active   cyanocobalamin  (VITAMIN B12) 1000 MCG tablet [id number] No Take 1 tablet (1,000 mcg total) by mouth daily. Timmy Forbes, MD Taking Active Self, Spouse/Significant Other, Pharmacy Records  Fluticasone -Umeclidin-Vilant (TRELEGY ELLIPTA ) 100-62.5-25 MCG/ACT AEPB [id number] No Inhale 1 puff into the lungs daily. Faith Homes, MD Taking Active   magnesium  oxide (MAG-OX) tablet 200 mg [id number]   Bair, Kalpana, MD  Active   meloxicam (MOBIC) 15 MG tablet [id number] No Take 15 mg by mouth daily. [provider] Taking Active   nitroGLYCERIN  (NITROSTAT ) 0.4 MG SL tablet [id number] No Place 1 tablet (0.4 mg total) under the tongue every 5 (five) minutes as needed for chest pain. For up to 3 doses per episode. Kent Pear, MD Taking Active Self, Spouse/Significant Other, Pharmacy Records  umeclidinium bromide  (INCRUSE ELLIPTA ) 62.5 MCG/ACT AEPB [id number] No Inhale into the lungs. [provider] Taking Active   valsartan  (DIOVAN ) 320 MG tablet [id number] No Take 320 mg by mouth daily. [provider] Taking Active             Recommendation:   Follow through on recommendations from PCP : Driving test, ST referral, Sleep study, cutting back on cigarettes  Follow Up Plan:   Telephone follow-up in 1 week  Gareld June, BSN, RN [place]  [facility] - [facility] RN Care Manager [phone]

## 2023-10-14 ENCOUNTER — Telehealth: Payer: Self-pay | Admitting: Cardiovascular Disease

## 2023-10-14 ENCOUNTER — Other Ambulatory Visit: Payer: Self-pay

## 2023-10-14 ENCOUNTER — Ambulatory Visit (INDEPENDENT_AMBULATORY_CARE_PROVIDER_SITE_OTHER)

## 2023-10-14 ENCOUNTER — Ambulatory Visit

## 2023-10-14 VITALS — BP 120/68 | HR 83 | Temp 97.7°F | Ht 67.0 in | Wt 154.8 lb

## 2023-10-14 DIAGNOSIS — F0390 Unspecified dementia without behavioral disturbance: Secondary | ICD-10-CM

## 2023-10-14 DIAGNOSIS — R790 Abnormal level of blood mineral: Secondary | ICD-10-CM | POA: Diagnosis not present

## 2023-10-14 DIAGNOSIS — I10 Essential (primary) hypertension: Secondary | ICD-10-CM

## 2023-10-14 DIAGNOSIS — E162 Hypoglycemia, unspecified: Secondary | ICD-10-CM | POA: Insufficient documentation

## 2023-10-14 LAB — BASIC METABOLIC PANEL WITH GFR
BUN: 15 mg/dL (ref 6–23)
CO2: 30 meq/L (ref 19–32)
Calcium: 9.3 mg/dL (ref 8.4–10.5)
Chloride: 102 meq/L (ref 96–112)
Creatinine, Ser: 1.21 mg/dL (ref 0.40–1.50)
GFR: 57.51 mL/min — ABNORMAL LOW (ref >60.00)
Glucose, Bld: 66 mg/dL — ABNORMAL LOW (ref 70–99)
Potassium: 4.4 meq/L (ref 3.5–5.1)
Sodium: 138 meq/L (ref 135–145)

## 2023-10-14 LAB — MAGNESIUM: Magnesium: 2.1 mg/dL (ref 1.5–2.5)

## 2023-10-14 NOTE — Progress Notes (Signed)
 Established Patient Office Visit   Subjective  Patient ID: Karl Myers, male    DOB: 05/17/1946  Age: 78 y.o. MRN: [id number]  Chief Complaint  Patient presents with   Dementia    Patient has been scheduled for his driving assessment since diagnosis on 11/08/23.   Insomnia    Patient states his sleep has not gotten any better.     He  has a past medical history of Actinic keratosis, Acute otitis media (05/08/2023), Arthritis, COPD (chronic obstructive pulmonary disease) (HCC), DDD (degenerative disc disease), cervical, DDD (degenerative disc disease), cervical, Dementia (HCC), Depression, History of COVID-19, Hypertension, Melanoma (HCC) (09/04/2019), and Pre-diabetes.  HPI Patient is here with his wife for follow up on insomnia, dementia and to discuss a form he received from [facility] that he would like to discuss. History obtained from both patient and his wife.   -- Patient received a letter dated 10/07/23 from the [facility] Medical Review Program requesting a completed Medical Report Form due to concerns about general health condition. It states the form must be filled out by and returned within 30 days to avoid cancellation of the driver's license. [place]5 [place] from his home. He has not gotten in any accident.   -- Patient has appointment with [facility] neurology clinic on 12/27/23 with Dr. Mason Sole.   -- He has an official driving test scheduled for June 2nd  -- He has an appointment with pulmonology Dr. Viva Grise on 10/26/23.   -- Has not seen sleep clinic. He goes to bed around 9-10 PM but wakes up around 11-12 AM, confused. His wife often finds the patient in the kitchen making coffee and getting ready for the day. Since he has been off  Robaxin, Tramadol , Robitussin,Trazodone , gabapentin , and alprazolam  patient's wife believes he has more mental clarity.   ROS As per HPI    Objective:     BP 120/68   Pulse 83   Temp 97.7 F  (36.5 C) (Oral)   Ht 5\' 7"  (1.702 m)   Wt 154 lb 12.8 oz (70.2 kg)   SpO2 94%   BMI 24.25 kg/m      10/14/2023    9:16 AM 10/07/2023   10:00 AM 08/16/2023   10:00 AM  Depression screen PHQ 2/9  Decreased Interest 1 0 0  Down, Depressed, Hopeless 1 0 0  PHQ - 2 Score 2 0 0  Altered sleeping 3 3 0  Tired, decreased energy 1 3 3   Change in appetite 2 0 0  Feeling bad or failure about yourself  0 0 0  Trouble concentrating 1 0 0  Moving slowly or fidgety/restless 2 3 3   Suicidal thoughts 0 0 0  PHQ-9 Score 11 9 6   Difficult doing work/chores Not difficult at all Not difficult at all Somewhat difficult      10/14/2023    9:16 AM 10/07/2023   10:00 AM 08/16/2023   10:03 AM 04/30/2023    9:51 AM  GAD 7 : Generalized Anxiety Score  Nervous, Anxious, on Edge 1 3 0 2  Control/stop worrying 1 0 0 0  Worry too much - different things 1 0 0 0  Trouble relaxing 1 1 0 0  Restless 1 3 0 0  Easily annoyed or irritable 1 1 0 0  Afraid - awful might happen 0 0 0 0  Total GAD 7 Score 6 8 0 2  Anxiety Difficulty Somewhat  difficult Not difficult at all Not difficult at all Not difficult at all    Physical Exam Constitutional:      Appearance: Normal appearance.  HENT:     Head: Normocephalic and atraumatic.     Mouth/Throat:     Mouth: Mucous membranes are moist.  Neck:     Thyroid : No thyroid  mass or thyroid  tenderness.  Cardiovascular:     Rate and Rhythm: Normal rate and regular rhythm.  Pulmonary:     Effort: Pulmonary effort is normal.     Breath sounds: Normal breath sounds.  Abdominal:     General: Bowel sounds are normal.     Palpations: Abdomen is soft.  Musculoskeletal:     Cervical back: Neck supple. No rigidity.     Right lower leg: No edema.     Left lower leg: No edema.  Skin:    General: Skin is warm.  Neurological:     Mental Status: He is alert and oriented to person, place, and time.  Psychiatric:        Mood and Affect: Mood normal.        Behavior: Behavior  normal.       No results found for any visits on 10/14/23.  The ASCVD Risk score (Arnett DK, et al., 2019) failed to calculate for the following reasons:   Risk score cannot be calculated because patient has a medical history suggesting prior/existing ASCVD    Assessment & Plan:  Primary hypertension Assessment & Plan: BP with in goal. Continue current treatment.   Dementia, unspecified dementia severity, unspecified dementia type, unspecified whether behavioral, psychotic, or mood disturbance or anxiety [facility]) Assessment & Plan: -- We were able to reach out to [facility] clinic and reschedule his appointment with neurology for May 29th with Dr. Carlis Cherry NP.  I counsel the patient to take the form from [facility] with them for his neurology appointment.  -- We also reached out to [facility] to request extension for his medical clearance for driving form. DMV requesting patient/ his wife reach out to them 1 week before May 30th for extension of date. Patient and his wife extensively counseled on this. They plan on calling DMV on may 24th.  -- Also counseled patient to schedule f/u with cardiology, ophthalmology and take this form with him to be filled out by them.   -- Has pulmonology appointment on 10/26/23. Keep this appointment.  -- Wait till patient is evaluated bu neurology before filling out the [facility] form, will wait for patient/his wife to reach out to us  after neurology appointment, call with DMV, driving test on June 2nd to decide what part of the form needs to be filled out by me.  -- PHQ-9 score reviewed. Patient's symptoms seems to be related with his sleep. Denies SI/HI. We will reach out to sleep clinic to f/u on his previous referral   Orders: -     Basic metabolic panel with GFR  Low blood magnesium  Assessment & Plan: Check Magnesium  level today, continue Magnesium  oxide 200 mg once a day. Check BMP for serum potassium level as well.   Orders: -     Magnesium   I spent 45 minutes on the  day of this face to face encounter reviewing patient's most recent visit with cardiology, hospital stay, reviewing the [facility] form he has, reaching out to neurology clinic,   prior relevant history, medical records, recent  labs reviewing the assessment and plan with patient as well as his wife extensively and  post visit ordering and reviewing of  diagnostics and therapeutics with patient .    Return in about 2 months (around 12/14/2023) for Chronic follow up.   Jacklin Mascot, MD

## 2023-10-14 NOTE — Assessment & Plan Note (Signed)
 BP with in goal. Continue current treatment.

## 2023-10-14 NOTE — Assessment & Plan Note (Signed)
 Check Magnesium  level today, continue Magnesium  oxide 200 mg once a day. Check BMP for serum potassium level as well.

## 2023-10-14 NOTE — Progress Notes (Signed)
 1. Low blood glucose measurement (Primary) - Glucose; Future  Karl Mascot, MD

## 2023-10-14 NOTE — Telephone Encounter (Signed)
 Pt called back to let them know pt's cardio notified and pt will bring paperwork by the office for me to review and make copies

## 2023-10-14 NOTE — Assessment & Plan Note (Addendum)
--   We were able to reach out to [facility] clinic and reschedule his appointment with neurology for May 29th with Dr. Carlis Cherry NP.  I counsel the patient to take the form from [facility] with them for his neurology appointment.  -- We also reached out to [facility] to request extension for his medical clearance for driving form. DMV requesting patient/ his wife reach out to them 1 week before May 30th for extension of date. Patient and his wife extensively counseled on this. They plan on calling DMV on may 24th.  -- Also counseled patient to schedule f/u with cardiology, ophthalmology and take this form with him to be filled out by them.   -- Has pulmonology appointment on 10/26/23. Keep this appointment.  -- Wait till patient is evaluated bu neurology before filling out the [facility] form, will wait for patient/his wife to reach out to us  after neurology appointment, call with DMV, driving test on June 2nd to decide what part of the form needs to be filled out by me.  -- PHQ-9 score reviewed. Patient's symptoms seems to be related with his sleep. Denies SI/HI. We will reach out to sleep clinic to f/u on his previous referral

## 2023-10-14 NOTE — Patient Instructions (Addendum)
--   Please call the number that is in the front of the form one week before May 30th to extend the date for this form to be filled out.  -- You have an appointment with neurology at [facility] clinic on May 29th at 2: 00 PM with Dr. Carlis Cherry NP. They will likely reach out to you to confirm the appointment. Please take this form during your appointment with the NP.  -- You will also need to take the form when you go to see the lung doctor on 10/26/23. -- You will have to schedule an appointment with your eye doctor, they will have to fill out part of the form. Please call your eye doctor's office to see if you need to schedule an eye appointment.  -- You will also need to see your cardiologist, where he has to fill out a portion of the form.   -- Your last appointment with cardiologist Dr. Gollan was on 12/07/2022. I would reach out to his office to see if you need an appointment to fill this form out or you can just take it to his office and they will fill out the cariology part of the form.

## 2023-10-14 NOTE — Telephone Encounter (Signed)
 Wife Stana Ear) stated she will be dropping off paperwork tomorrow for the doctor to complete for patient's driving license.

## 2023-10-14 NOTE — Progress Notes (Signed)
 Please call the patient to let him know his magnesium , kidney function looks stable. He did have low blood glucose this morning. Please check to see if he was fasting. Low blood glucose can cause shakiness, dizziness, sweating, confusion. Recommend regular balances meals, snacks, protein to maintain stable blood glucose. If he develops low blood glucose symptoms as mentioned I recommend he drinks some regular juice, soda, eats candy and follow up with us  promptly. I also recommend rechecking his blood glucose next week. He does not need to see me for it, it is just to make sure his blood glucose is back to baseline.   Thank you,  Jacklin Mascot, MD

## 2023-10-18 ENCOUNTER — Ambulatory Visit: Admitting: Speech Pathology

## 2023-10-18 DIAGNOSIS — R41841 Cognitive communication deficit: Secondary | ICD-10-CM | POA: Insufficient documentation

## 2023-10-18 DIAGNOSIS — R1312 Dysphagia, oropharyngeal phase: Secondary | ICD-10-CM | POA: Diagnosis not present

## 2023-10-18 DIAGNOSIS — J189 Pneumonia, unspecified organism: Secondary | ICD-10-CM | POA: Insufficient documentation

## 2023-10-18 NOTE — Telephone Encounter (Signed)
 The patient's wife, Stana Ear, was called for documents ready - reports will be picked up on 5/13--  Pt's wife thankful for prompt attention from Dr Marjo Sievert placed in front office in labelled folder

## 2023-10-18 NOTE — Therapy (Unsigned)
 OUTPATIENT SPEECH LANGUAGE PATHOLOGY  DYSPHAGIA EVALUATOIN COGNITION EVALUATION   Patient Name: Karl Myers MRN: [id number] DOB:May 27, 1946, 78 y.o., male Today's Date: 10/18/2023  PCP: Jacklin Mascot, MD REFERRING PROVIDER: Jacklin Mascot, MD   End of Session - 10/18/23 1310     Visit Number 1    Number of Visits 25    Date for SLP Re-Evaluation 01/10/24    Authorization Type [employer] [facility] [employer]    Progress Note Due on Visit 10    SLP Start Time 1005    SLP Stop Time  1100    SLP Time Calculation (min) 55 min    Activity Tolerance Patient tolerated treatment well             Past Medical History:  Diagnosis Date   Actinic keratosis    Acute otitis media 05/08/2023   Arthritis    COPD (chronic obstructive pulmonary disease) (HCC)    DDD (degenerative disc disease), cervical    DDD (degenerative disc disease), cervical    Dementia (HCC)    Depression    History of COVID-19    Hypertension    Melanoma (HCC) 09/04/2019   R 5th toe lateral tip, Stage IV, 0.41mm Breslow's   Pre-diabetes    Past Surgical History:  Procedure Laterality Date   BACK SURGERY     x 3   CARPAL TUNNEL RELEASE Right 08/19/2017   Procedure: CARPAL TUNNEL RELEASE;  Surgeon: Molli Angelucci, MD;  Location: [facility] ORS;  Service: Orthopedics;  Laterality: Right;   CATARACT EXTRACTION W/PHACO Right 11/17/2016   Procedure: CATARACT EXTRACTION PHACO AND INTRAOCULAR LENS PLACEMENT (IOC);  Surgeon: Clair Crews, MD;  Location: [facility] OR[facility];  Service: Ophthalmology;  Laterality: Right;  US  00:37 AP% 12.6 CDE 4.69 Fluid pack lot # [id number]   CATARACT EXTRACTION W/PHACO Left 12/08/2016   Procedure: CATARACT EXTRACTION PHACO AND INTRAOCULAR LENS PLACEMENT (IOC);  Surgeon: Clair Crews, MD;  Location: [facility] OR[facility];  Service: Ophthalmology;  Laterality: Left;  US  00:33 AP% 13.9 CDE 4.63 Fluid pack lot # [id number]   TOE AMPUTATION Left    ULNAR TUNNEL RELEASE Right 08/19/2017   Procedure:  CUBITAL TUNNEL RELEASE;  Surgeon: Molli Angelucci, MD;  Location: [facility] OR[facility];  Service: Orthopedics;  Laterality: Right;   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   Low blood magnesium  10/07/2023   Insomnia 10/07/2023   Tobacco use disorder, continuous 10/07/2023   Tobacco abuse counseling 10/07/2023   Left sided lacunar stroke (HCC) 10/07/2023   Dementia (HCC) 09/14/2023   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   Adrenal nodule (HCC) 08/16/2023   Pneumonia due to infectious organism 06/15/2023   COVID-19 06/09/2023   COPD exacerbation (HCC) 06/08/2023   Depression 06/08/2023   Acute hypoxic respiratory failure (HCC) 06/08/2023   Weakness 06/08/2023   COVID-19 virus infection 06/08/2023   Impacted cerumen of left ear 05/08/2023   Actinic keratosis 05/08/2023   PAD (peripheral artery disease) (HCC) 04/09/2023   Lumbar radiculopathy 03/15/2023   Fear of falling 12/09/2022   Headache 07/27/2022   Macrocytosis 03/26/2022   Low serum vitamin B12 03/26/2022   Elevated MCV 02/03/2022   Memory difficulty 01/05/2022   Prediabetes 01/05/2022   Right knee pain 07/07/2021   History of melanoma 10/13/2019   Atrial fibrillation (HCC) 08/01/2019   Low TSH level 08/01/2019   COPD (chronic obstructive pulmonary disease) (HCC)    Anxiety    AKI (acute kidney injury) (  HCC)    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   Family history of prostate cancer in father 06/17/2018   CAD (coronary artery disease) 07/27/2017   Tobacco abuse 06/24/2017   Hypertension 03/12/2017   Hyperlipidemia 03/12/2017   Fatigue 03/12/2017   Sleeping difficulty 03/12/2017   Cervical radiculopathy 03/12/2017    ONSET DATE: date of referral  10/07/2023  REFERRING DIAG: J18.9 (ICD-10-CM) - Pneumonia of left lower lobe due to infectious organism: left sided lacunar infarction - I63.81 Dementia, unspecified dementia severity, unspecified  dementia type, unspecified whether behavioral, psychotic, or mood disturbance or anxiety (F03.90)  THERAPY DIAG:  Cognitive communication deficit  Dysphagia, oropharyngeal phase  Rationale for Evaluation and Treatment Rehabilitation  SUBJECTIVE:   SUBJECTIVE STATEMENT: Pt pleasant,  Pt accompanied by: significant other  PERTINENT HISTORY and DIAGNOSTIC FINDINGS:  Pt is a right handed 78 year old male with   12/09/2022 Memory difficulty, Chronic issue.  Likely progressing.  Patient likely has dementia given his slums score (19/30).    06/13/2023 & 08/08/2023 Pneumonia of left lower lobe due to infectious organism  09/30/2023 thru 10/02/2023 Pt admitted to [facility] d/t delirium. Per chart "Pt with history of cognitive impairment with 1 month of worsening confusion and increased restlessness over the past 3 days, as well as "shuffling gait" over the past few months. CT head w/o acute findings, notable for global cerebral atrophy, chronic small vessel ischemia dz, chronic lacunar infarct in L basal ganglia."  Per pt's wife, pt with choking episode following discharge therefore dysphagia evaluation was requested.    PAIN:  Are you having pain? No   FALLS: Has patient fallen in last 6 months?  No  LIVING ENVIRONMENT: Lives with: lives with their spouse Lives in: House/apartment  PLOF:  Level of assistance: Independent with ADLs, Independent with IADLs Employment: Retired   PATIENT GOALS   to reduce choking risks, pt's wife concerned about recent decline in sleeping and cognition related to dx of dementia  OBJECTIVE:   COGNITIVE COMMUNICATION Overall cognitive status: Impaired Areas of impairment:  Attention: Impaired: Alternating, Divided Memory: Impaired: Immediate Working Short term Prospective Awareness: Impaired: Anticipatory Executive function: Impaired: Comment: all impaired by lower level deficits Impaired Safety awareness: impaired, pt not able to recall need  to stop driving until completion of driving evaluation, "I drove us  here this morning"  Functional Impairments: See clinical impressions statement at bottom  AUDITORY COMPREHENSION  Overall auditory comprehension: Appears intact YES/NO questions: Appears intact Following directions: impaired d/t deficits in working [employer]: Simple Interfering components: attention, awareness, and working Research scientist (life sciences): repetition/stressing words   EXPRESSION: verbal  VERBAL EXPRESSION:   Overall verbal expression: Appears intact Level of generative/spontaneous verbalization: sentence Automatic speech: name: intact and social response: intact  Repetition: Appears intact Naming: Responsive: 76-100%, Confrontation: 76-100%, Convergent: 76-100%, and Divergent: 26-50% Pragmatics: Appears intact Interfering components: cognitive deficits Non-verbal means of communication: N/A  WRITTEN EXPRESSION: Dominant hand: right Written expression: Appears intact for simple sentences  ORAL MOTOR EXAMINATION Facial : WFL Lingual: WFL Velum: WFL Mandible: WFL Cough: WFL Voice: WFL, pt presents with resonance variants but both report pt at baseline  MOTOR SPEECH: Overall motor speech: Appears intact Respiration: diaphragmatic/abdominal breathing Phonation: normal Resonance: WFL Articulation: Appears intact Intelligibility: Intelligible Motor planning: Appears intact  CLINICAL SWALLOW ASSESSMENT:   Current diet: regular and thin liquids Dentition: adequate natural dentition Feeding: able to feed self Consistencies tested: Thin Liquid: Presentation: Cup and Self-fed Oral Phase: WFL Pharyngeal Phase: WFL Regular:  Presentation: By hand Oral Phase: WFL Pharyngeal Phase: WFL   Evaluation findings: Patient presents with oropharyngeal swallow which appears clinically to be within functional limits with adequate airway protection. Oral stage is characterized by appearance of  adequate oral containment, mastication, bolus formation, oral transfer and oral clearance. Swallow initiation appears timely. No overt signs of aspiration observed despite challenging with consecutive straw sips of thin liquids in excess of 3oz.  Aspiration risk factors:History of pneumonia, Neurological disease, and Cognitive impairment Overall aspiration risk:Mild Diet Recommendations: regular and thin liquids Precautions:Minimize environmental distractions, Slow rate, and Small sips/bites Supervision: Patient able to feed self Oral care recommendations:Oral care BID Follow-up recommendations: Therapy as outlined in treatment plan below    STANDARDIZED ASSESSMENTS: Addenbrooke's Cognitive Examination - ACE III The Addenbrooke's Cognitive Examination-III (ACE-III) is a brief cognitive test that assesses five cognitive domains. The total score is 100 with higher scores indicating better cognitive functioning. Cut off scores of 88 and 82 are recommended for suspicion of dementia (88 has sensitivity of 1.00 and specificity of 0.96, 82 has sensitivity of 0.93 and specificity of 1.00). American Version B  Attention 14/18  Memory 11/26  Fluency 3/14  Language 23/26  Visuospatial 11/16  TOTAL ACE- III Score 62/100     PATIENT REPORTED OUTCOME MEASURES (PROM): To be completed over the next 3 sessions   TODAY'S TREATMENT:  Skilled instruction and education provided on aspiration precautions and impact that cognition has on oropharyngeal abilities. Current diet recommendation of regular with thin liquids, medicine whole with thin liquids.    PATIENT EDUCATION: Education details: results of the above cognitive assessment, ST POC, recommend pt cease driving until completion of assessments Person educated: Patient and Spouse Education method: Explanation Education comprehension: needs further education   HOME EXERCISE PROGRAM:   N/A   GOALS:  Goals reviewed with patient? Yes  SHORT  TERM GOALS: Target date: 10 sessions   With Min A, patient will recall functional verbal information 75% accuracy with use of compensations/strategies (repeats, rephrasing, notetaking, etc).  Baseline: Goal status: INITIAL  2.  With Min A, patient will demonstrate knowledge of appropriate activities to support cognitive and  language function outside of ST with assistance from family.   Baseline:  Goal status: INITIAL  3.  With Min A, given a functional problem a scenario from daily life, patient will demonstrate error awareness and correct errors  with at least 75% accuracy.  Baseline:  Goal status: INITIAL   LONG TERM GOALS: Target date: 01/10/2024  With Rare Min A, patient will use strategies to improve memory for important information with 90% acc. Independently (ie.,daily planner/calendar, lists).   Baseline:  Goal status: INITIAL  2.  With Min A, patient/family will demonstrate understanding of the following concepts: Dementia, impact on cognition, safety awareness, strengths/strategies to promote success, local resources by answering multiple choice questions with 75% accuracy when provided supported conversation in order to increase patient's participation in medical care.  Baseline:  Goal status: INITIAL  3.  Pt will consume least restrictive diet with reduced overt s/s of aspiration or dysphagia to reduce risk of malnutrition, dehydration and pneumonia.  Baseline:  Goal status: INITIAL   ASSESSMENT:  CLINICAL IMPRESSION: Patient is a 78 y.o. right handed male who was seen today for clinical swallow evaluation and cognitive communication evaluation.  Pt presents with adequate oropharyngeal abilities when consuming thin liquids and regular textures (graham crackers).    Suspect pt's intermittent dysphagia is related to overall cognitive decline. As such, assessment  provided of cognitive communicaiton abilities. Pt's wife describes gradual memory loss over the last several  years with worsening abilities over the last several months "really since April, they took him off of mind altering medicines" in the hospital during his admission at [facility] (04/24 thru 10/02/2023). She has not noticed any improvement in pt ability.    Confusion is especially present in pt's sleeping patterns. She states "he is up and down thorughout the night, gets up at 11:30 pm to perk the coffee" and is not able to be redirected, he doesn't understand that it is nighttime, this confusion also results in moving to spare bedroom and then the couch and back to their bed.   Functionally, pt is now dependent on his wife for medication management, reports that his attempts to balance their checkbook has resulted as "now it is screwedup," pt unable to recall recommendation by PCP to cease driving until completion of assessments "well, I drove over here." While pt's wife recalls this information she reports being relunctant to remind him and offer driving because "it would result in a fuss, he has always had control of everything."  Formally pt presents with a moderate amnestic cognitive impairment with severe deficits in short term memory, retrieval of information, prospective memory, awareness of impairments/deficits, moderate deficits in problem solving and reasoning, severe executive function deficits.    In light of the above deficits, would recommend continued ST sessions to target education on safety, current cognitive impairments, progressive process associated with current dx of dementia and to promote safe opportunities for pt to engage in cognitive activities. Pt and his wife were agreeable and this Clinical research associate will seek certification for such.   While his wife voices recall of information, she voices relunctance to remind/enforce recommendations as "he has always been in control and he doesn't like it when he isn't."  May need to recruit additional family members into education regarding cognitive  impairment and safety.    OBJECTIVE IMPAIRMENTS include attention, memory, awareness, and executive functioning. These impairments are limiting patient from managing medications, managing appointments, managing finances, household responsibilities, and ADLs/IADLs. Factors affecting potential to achieve goals and functional outcome are ability to learn/carryover information, co-morbidities, medical prognosis, previous level of function, severity of impairments, and family/community support. Patient will benefit from skilled SLP services to address above impairments and improve overall function.  REHAB POTENTIAL: Good  PLAN: SLP FREQUENCY: 1-2x/week  SLP DURATION: 12 weeks  PLANNED INTERVENTIONS: Aspiration precaution training, Environmental controls, Cognitive reorganization, Internal/external aids, Functional tasks, SLP instruction and feedback, Compensatory strategies, and Patient/family education   Marayah Higdon B. Garlin Junker, M.S., CCC-SLP, Tree surgeon Certified Brain Injury Specialist [facility]  [facility] Rehabilitation Services Office [phone] Ascom [phone] Fax [phone]

## 2023-10-19 ENCOUNTER — Other Ambulatory Visit: Payer: Self-pay

## 2023-10-19 DIAGNOSIS — Z961 Presence of intraocular lens: Secondary | ICD-10-CM | POA: Diagnosis not present

## 2023-10-19 NOTE — Transitions of Care (Post Inpatient/ED Visit) (Signed)
 Transition of Care week 3  Visit Note  10/19/2023  Name: Karl Myers MRN: [id number]          DOB: [birth year]  Situation: Patient enrolled in [facility] 30-day program. Visit completed with Ralene Burger, DPR by telephone.   Background:     Past Medical History:  Diagnosis Date   Actinic keratosis    Acute otitis media 05/08/2023   Arthritis    COPD (chronic obstructive pulmonary disease) (HCC)    DDD (degenerative disc disease), cervical    DDD (degenerative disc disease), cervical    Dementia (HCC)    Depression    History of COVID-19    Hypertension    Melanoma (HCC) 09/04/2019   R 5th toe lateral tip, Stage IV, 0.44mm Breslow's   Pre-diabetes     Assessment: TOC Outreach provided today. The patient's spouse answered the phone. Reviewed PCP appointment and upcoming provider appointments with Neurology and Pulmonology. They are awaiting to be contacted by the sleep specialist. The spouse reports the patient is waking up in the middle of the night and is wandering around "looking for children". They are waiting for the appointment with the Neurologist prior to filling out the forms needed for the [facility] to see if he can drive. He has had some low blood sugars around 65 and reports shakiness. He will drink some juice to increase it. SLP cleared him of aspiration. The patient has an appointment with the Pulmonologist next week. He has been a smoker for about 60 years.  Patient Reported Symptoms: Cognitive Cognitive Status: Alert and oriented to person, place, and time, Confused or disoriented, Struggling with memory recall (confused at times at night) Cognitive/Intellectual Conditions Management [RPT]: None reported or documented in medical history or problem list   Health Maintenance Behaviors: Annual physical exam Healing Pattern: Average Health Facilitated by: Rest  Neurological Neurological Review of Symptoms: No symptoms reported Neurological Conditions: Alzheimer's  disease Neurological Management Strategies: Coping strategies Neurological Self-Management Outcome: 3 (uncertain) Neurological Comment: Awaiting Neurology referral  HEENT HEENT Symptoms Reported: No symptoms reported      Cardiovascular Cardiovascular Symptoms Reported: No symptoms reported Does patient have uncontrolled Hypertension?: No Cardiovascular Conditions: High blood cholesterol, Hypertension Cardiovascular Management Strategies: Medication therapy Weight: 154 lb (69.9 kg) Cardiovascular Self-Management Outcome: 4 (good)  Respiratory Respiratory Symptoms Reported: No symptoms reported Additional Respiratory Details: Daily smoker Respiratory Conditions: COPD Respiratory Self-Management Outcome: 4 (good)  Endocrine Patient reports the following symptoms related to hypoglycemia or hyperglycemia : Shakiness Is patient diabetic?: No Endocrine Self-Management Outcome: 4 (good) Endocrine Comment: The patient does not check his blood glucose. Sometimes when he s shaky his spouse tests his blood glucose and she stated she did that today and it was 65  Gastrointestinal Gastrointestinal Symptoms Reported: No symptoms reported   Nutrition Risk Screen (CP): No indicators present  Genitourinary Genitourinary Symptoms Reported: Incontinence Additional Genitourinary Details: Occasional Genitourinary Conditions: Incontinence Genitourinary Management Strategies: Coping strategies Genitourinary Self-Management Outcome: 3 (uncertain) Genitourinary Comment: Occasional Incontinence  Integumentary Integumentary Symptoms Reported: No symptoms reported    Musculoskeletal Musculoskelatal Symptoms Reviewed: No symptoms reported Musculoskeletal Management Strategies: Routine screening Falls in the past year?: Yes Number of falls in past year: 2 or more Was there an injury with Fall?: No Fall Risk Category Calculator: 2 Patient Fall Risk Level: Moderate Fall Risk Patient at Risk for Falls Due to:  History of fall(s), Mental status change Fall risk Follow up: Falls evaluation completed  Psychosocial Psychosocial Symptoms Reported: No symptoms reported Behavioral  Health Conditions: Alzheimer's disease Behavioral Management Strategies: Coping strategies, Support system Behavioral Health Self-Management Outcome: 3 (uncertain) Behavioral Health Comment: Referral to Neurology   Do you feel physically threatened by others?: No   There were no vitals filed for this visit.  Medications Reviewed Today     Reviewed by Claudene Crystal, RN (Case Manager) on 10/19/23 at 1329  Med List Status: <None>   Medication Order Taking? Sig Documenting Provider Last Dose Status Informant  apixaban  (ELIQUIS ) 5 MG TABS tablet [id number] No TAKE ONE TABLET BY MOUTH TWICE DAILY Gollan, Timothy J, MD Taking Active Self, Spouse/Significant Other, Pharmacy Records  atenolol  (TENORMIN ) 25 MG tablet [id number] No Take 1 tablet (25 mg total) by mouth at bedtime. Kent Pear, MD Taking Active Self, Spouse/Significant Other, Pharmacy Records  atorvastatin  (LIPITOR) 80 MG tablet [id number] No Take 1 tablet (80 mg total) by mouth daily. Thersia Flax, MD Taking Active   Cholecalciferol (VITAMIN D3) 25 MCG (1000 UT) CAPS [id number] No Take by mouth. [provider] Taking Active   cyanocobalamin  (VITAMIN B12) 1000 MCG tablet [id number] No Take 1 tablet (1,000 mcg total) by mouth daily. Timmy Forbes, MD Taking Active Self, Spouse/Significant Other, Pharmacy Records  Fluticasone -Umeclidin-Vilant (TRELEGY ELLIPTA ) 100-62.5-25 MCG/ACT AEPB [id number] No Inhale 1 puff into the lungs daily. Faith Homes, MD Taking Active   magnesium  oxide (MAG-OX) tablet 200 mg [id number]   Bair, Kalpana, MD  Active   meloxicam (MOBIC) 15 MG tablet [id number] No Take 15 mg by mouth daily. [provider] Taking Active   nitroGLYCERIN  (NITROSTAT ) 0.4 MG SL tablet [id number] No Place 1 tablet (0.4 mg total) under the tongue every  5 (five) minutes as needed for chest pain. For up to 3 doses per episode. Kent Pear, MD Taking Active Self, Spouse/Significant Other, Pharmacy Records  umeclidinium bromide  (INCRUSE ELLIPTA ) 62.5 MCG/ACT AEPB [id number] No Inhale into the lungs. [provider] Taking Active   valsartan  (DIOVAN ) 320 MG tablet [id number] No Take 320 mg by mouth daily. [provider] Taking Active             Recommendation:   Complete appointments with SLP for cognitive therapy Pulmonology 10/26/23  Follow Up Plan:   Telephone follow-up in 1 week  Gareld June, BSN, RN [place]  VBCI - [facility] RN Care Manager [phone]

## 2023-10-19 NOTE — Patient Instructions (Signed)
 Visit Information  Thank you for taking time to visit with me today. Please don't hesitate to contact me if I can be of assistance to you before our next scheduled telephone appointment.  Our next appointment is by telephone on Wednesday May 21st at 2:00pm  Following is a copy of your care plan:   Goals Addressed             This Visit's Progress    VBCI Transitions of Care (TOC) Care Plan       Problems: (reviewed 10/19/23) Recent Hospitalization for treatment of Dementia Limited social support: The spouse is the primary support and this is a new diagnosis for the patient   Goal: (reviewed 10/19/23) Over the next 30 days, the patient will not experience hospital readmission  Interventions: (reviewed 10/12/23)  Dementia: Evaluation of current treatment plan related to misuse of: Alzheimer's dementia Emotional Support Provided to patient/caregiver, Sleep assessment completed, Sleep hygiene recommendations and education provided, Consideration of in-home help encouraged , Discussed Health Care Power of [place] , and Advised to contact provider for new or worsening symptoms Driving test scheduled for 11/08/23 to assess competency ST referral for choking episode - Rule Out Aspiration (completed) Magnesium  oxide 200mg  daily Referral to Neurology - 11/04/23 Sleep study due to insomnia Referral to Pulmonology - 11/04/23  Patient Self Care Activities: (reviewed 10/19/23) Attend all scheduled provider appointments Call pharmacy for medication refills 3-7 days in advance of running out of medications Call provider office for new concerns or questions  Notify RN Care Manager of [facility] call rescheduling needs Participate in Transition of Care Program/Attend TOC scheduled calls Take medications as prescribed   Fall Precautions - The patient is on Apixaban . Educated on increased risk of bleeding  Plan:  Telephone follow up appointment with care management team member scheduled for:  Wednesday  May  21st at 2:00pm        The patient verbalized understanding of instructions, educational materials, and care plan provided today and agreed to receive a mailed copy of patient instructions, educational materials, and care plan.   The patient has been provided with contact information for the care management team and has been advised to call with any health related questions or concerns.   Please call the care guide team at [phone] if you need to cancel or reschedule your appointment.   Please call the Suicide and Crisis Lifeline: 988 call the [place]  National Suicide Prevention Lifeline: 207 421 0098 or TTY: 5676858151 TTY (580)[phone]) to talk to a trained counselor if you are experiencing a Mental Health or Behavioral Health Crisis or need someone to talk to.  Gareld June, BSN, RN [place]  VBCI - [employer] [facility] RN Care Manager [phone]

## 2023-10-20 ENCOUNTER — Ambulatory Visit: Admitting: Speech Pathology

## 2023-10-20 DIAGNOSIS — R41841 Cognitive communication deficit: Secondary | ICD-10-CM | POA: Diagnosis not present

## 2023-10-20 DIAGNOSIS — J189 Pneumonia, unspecified organism: Secondary | ICD-10-CM | POA: Diagnosis not present

## 2023-10-20 DIAGNOSIS — R1312 Dysphagia, oropharyngeal phase: Secondary | ICD-10-CM | POA: Diagnosis not present

## 2023-10-20 NOTE — Therapy (Signed)
 OUTPATIENT SPEECH LANGUAGE PATHOLOGY  COGNITION TREATMENT   Patient Name: Karl Myers MRN: [id number] DOB:1945/07/17, 78 y.o., male Today's Date: 10/20/2023  PCP: Jacklin Mascot, MD REFERRING PROVIDER: Jacklin Mascot, MD   End of Session - 10/20/23 1101     Visit Number 2    Number of Visits 25    Date for SLP Re-Evaluation 01/10/24    Authorization Type [employer] [facility] Medicare    Progress Note Due on Visit 10    SLP Start Time 1100    SLP Stop Time  1145    SLP Time Calculation (min) 45 min    Activity Tolerance Patient tolerated treatment well             Past Medical History:  Diagnosis Date   Actinic keratosis    Acute otitis media 05/08/2023   Arthritis    COPD (chronic obstructive pulmonary disease) (HCC)    DDD (degenerative disc disease), cervical    DDD (degenerative disc disease), cervical    Dementia (HCC)    Depression    History of COVID-19    Hypertension    Melanoma (HCC) 09/04/2019   R 5th toe lateral tip, Stage IV, 0.15mm Breslow's   Pre-diabetes    Past Surgical History:  Procedure Laterality Date   BACK SURGERY     x 3   CARPAL TUNNEL RELEASE Right 08/19/2017   Procedure: CARPAL TUNNEL RELEASE;  Surgeon: Molli Angelucci, MD;  Location: [facility] ORS;  Service: Orthopedics;  Laterality: Right;   CATARACT EXTRACTION W/PHACO Right 11/17/2016   Procedure: CATARACT EXTRACTION PHACO AND INTRAOCULAR LENS PLACEMENT (IOC);  Surgeon: Clair Crews, MD;  Location: [facility] OR[facility];  Service: Ophthalmology;  Laterality: Right;  US  00:37 AP% 12.6 CDE 4.69 Fluid pack lot # [id number]   CATARACT EXTRACTION W/PHACO Left 12/08/2016   Procedure: CATARACT EXTRACTION PHACO AND INTRAOCULAR LENS PLACEMENT (IOC);  Surgeon: Clair Crews, MD;  Location: [facility] OR[facility];  Service: Ophthalmology;  Laterality: Left;  US  00:33 AP% 13.9 CDE 4.63 Fluid pack lot # [id number]   TOE AMPUTATION Left    ULNAR TUNNEL RELEASE Right 08/19/2017   Procedure: CUBITAL TUNNEL RELEASE;   Surgeon: Molli Angelucci, MD;  Location: [facility] OR[facility];  Service: Orthopedics;  Laterality: Right;   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   Low blood magnesium  10/07/2023   Insomnia 10/07/2023   Tobacco use disorder, continuous 10/07/2023   Tobacco abuse counseling 10/07/2023   Left sided lacunar stroke (HCC) 10/07/2023   Dementia (HCC) 09/14/2023   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   Adrenal nodule (HCC) 08/16/2023   Pneumonia due to infectious organism 06/15/2023   COVID-19 06/09/2023   COPD exacerbation (HCC) 06/08/2023   Depression 06/08/2023   Acute hypoxic respiratory failure (HCC) 06/08/2023   Weakness 06/08/2023   COVID-19 virus infection 06/08/2023   Impacted cerumen of left ear 05/08/2023   Actinic keratosis 05/08/2023   PAD (peripheral artery disease) (HCC) 04/09/2023   Lumbar radiculopathy 03/15/2023   Fear of falling 12/09/2022   Headache 07/27/2022   Macrocytosis 03/26/2022   Low serum vitamin B12 03/26/2022   Elevated MCV 02/03/2022   Memory difficulty 01/05/2022   Prediabetes 01/05/2022   Right knee pain 07/07/2021   History of melanoma 10/13/2019   Atrial fibrillation (HCC) 08/01/2019   Low TSH level 08/01/2019   COPD (chronic obstructive pulmonary disease) (HCC)    Anxiety    AKI (acute kidney injury) (HCC)  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   Family history of prostate cancer in father 06/17/2018   CAD (coronary artery disease) 07/27/2017   Tobacco abuse 06/24/2017   Hypertension 03/12/2017   Hyperlipidemia 03/12/2017   Fatigue 03/12/2017   Sleeping difficulty 03/12/2017   Cervical radiculopathy 03/12/2017    ONSET DATE: date of referral  10/07/2023  REFERRING DIAG: J18.9 (ICD-10-CM) - Pneumonia of left lower lobe due to infectious organism: left sided lacunar infarction - I63.81 Dementia, unspecified dementia severity, unspecified dementia type, unspecified  whether behavioral, psychotic, or mood disturbance or anxiety (F03.90)  THERAPY DIAG:  Cognitive communication deficit  Rationale for Evaluation and Treatment Rehabilitation  SUBJECTIVE:   SUBJECTIVE STATEMENT: Pt pleasant,  Pt accompanied by: significant other  PERTINENT HISTORY and DIAGNOSTIC FINDINGS:  Pt is a right handed 78 year old male with   12/09/2022 Memory difficulty, Chronic issue.  Likely progressing.  Patient likely has dementia given his slums score (19/30).    06/13/2023 & 08/08/2023 Pneumonia of left lower lobe due to infectious organism  09/30/2023 thru 10/02/2023 Pt admitted to [facility] d/t delirium. Per chart "Pt with history of cognitive impairment with 1 month of worsening confusion and increased restlessness over the past 3 days, as well as "shuffling gait" over the past few months. CT head w/o acute findings, notable for global cerebral atrophy, chronic small vessel ischemia dz, chronic lacunar infarct in L basal ganglia."  Per pt's wife, pt with choking episode following discharge therefore dysphagia evaluation was requested.    PAIN:  Are you having pain? No   FALLS: Has patient fallen in last 6 months?  No  LIVING ENVIRONMENT: Lives with: lives with their spouse Lives in: House/apartment  PLOF:  Level of assistance: Independent with ADLs, Independent with IADLs Employment: Retired   PATIENT GOALS   to reduce choking risks, pt's wife concerned about recent decline in sleeping and cognition related to dx of dementia  OBJECTIVE:   COGNITIVE COMMUNICATION Overall cognitive status: Impaired Areas of impairment:  Attention: Impaired: Alternating, Divided Memory: Impaired: Immediate Working Short term Prospective Awareness: Impaired: Anticipatory Executive function: Impaired: Comment: all impaired by lower level deficits Impaired Safety awareness: impaired, pt not able to recall need to stop driving until completion of driving evaluation, "I  drove us  here this morning"  Functional Impairments: See clinical impressions statement at bottom  AUDITORY COMPREHENSION  Overall auditory comprehension: Appears intact YES/NO questions: Appears intact Following directions: impaired d/t deficits in working [employer]: Simple Interfering components: attention, awareness, and working Research scientist (life sciences): repetition/stressing words   EXPRESSION: verbal  VERBAL EXPRESSION:   Overall verbal expression: Appears intact Level of generative/spontaneous verbalization: sentence Automatic speech: name: intact and social response: intact  Repetition: Appears intact Naming: Responsive: 76-100%, Confrontation: 76-100%, Convergent: 76-100%, and Divergent: 26-50% Pragmatics: Appears intact Interfering components: cognitive deficits Non-verbal means of communication: N/A  WRITTEN EXPRESSION: Dominant hand: right Written expression: Appears intact for simple sentences  ORAL MOTOR EXAMINATION Facial : WFL Lingual: WFL Velum: WFL Mandible: WFL Cough: WFL Voice: WFL, pt presents with resonance variants but both report pt at baseline  MOTOR SPEECH: Overall motor speech: Appears intact Respiration: diaphragmatic/abdominal breathing Phonation: normal Resonance: WFL Articulation: Appears intact Intelligibility: Intelligible Motor planning: Appears intact  CLINICAL SWALLOW ASSESSMENT:   Current diet: regular and thin liquids Dentition: adequate natural dentition Feeding: able to feed self Consistencies tested: Thin Liquid: Presentation: Cup and Self-fed Oral Phase: WFL Pharyngeal Phase: WFL Regular: Presentation: By hand Oral Phase: WFL Pharyngeal Phase:  WFL   Evaluation findings: Patient presents with oropharyngeal swallow which appears clinically to be within functional limits with adequate airway protection. Oral stage is characterized by appearance of adequate oral containment, mastication, bolus formation, oral  transfer and oral clearance. Swallow initiation appears timely. No overt signs of aspiration observed despite challenging with consecutive straw sips of thin liquids in excess of 3oz.  Aspiration risk factors:History of pneumonia, Neurological disease, and Cognitive impairment Overall aspiration risk:Mild Diet Recommendations: regular and thin liquids Precautions:Minimize environmental distractions, Slow rate, and Small sips/bites Supervision: Patient able to feed self Oral care recommendations:Oral care BID Follow-up recommendations: Therapy as outlined in treatment plan below    STANDARDIZED ASSESSMENTS: Addenbrooke's Cognitive Examination - ACE III The Addenbrooke's Cognitive Examination-III (ACE-III) is a brief cognitive test that assesses five cognitive domains. The total score is 100 with higher scores indicating better cognitive functioning. Cut off scores of 88 and 82 are recommended for suspicion of dementia (88 has sensitivity of 1.00 and specificity of 0.96, 82 has sensitivity of 0.93 and specificity of 1.00). American Version B  Attention 14/18  Memory 11/26  Fluency 3/14  Language 23/26  Visuospatial 11/16  TOTAL ACE- III Score 62/100     PATIENT REPORTED OUTCOME MEASURES (PROM): To be completed over the next 3 sessions   TODAY'S TREATMENT:  Skilled instruction and education provided on aspiration precautions and impact that cognition has on oropharyngeal abilities. Current diet recommendation of regular with thin liquids, medicine whole with thin liquids.    PATIENT EDUCATION: Education details: results of the above cognitive assessment, ST POC, recommend pt cease driving until completion of assessments Person educated: Patient and Spouse Education method: Explanation Education comprehension: needs further education   HOME EXERCISE PROGRAM:   N/A   GOALS:  Goals reviewed with patient? Yes  SHORT TERM GOALS: Target date: 10 sessions   With Min A, patient  will recall functional verbal information 75% accuracy with use of compensations/strategies (repeats, rephrasing, notetaking, etc).  Baseline: Goal status: INITIAL  2.  With Min A, patient will demonstrate knowledge of appropriate activities to support cognitive and  language function outside of ST with assistance from family.   Baseline:  Goal status: INITIAL  3.  With Min A, given a functional problem a scenario from daily life, patient will demonstrate error awareness and correct errors  with at least 75% accuracy.  Baseline:  Goal status: INITIAL   LONG TERM GOALS: Target date: 01/10/2024  With Rare Min A, patient will use strategies to improve memory for important information with 90% acc. Independently (ie.,daily planner/calendar, lists).   Baseline:  Goal status: INITIAL  2.  With Min A, patient/family will demonstrate understanding of the following concepts: Dementia, impact on cognition, safety awareness, strengths/strategies to promote success, local resources by answering multiple choice questions with 75% accuracy when provided supported conversation in order to increase patient's participation in medical care.  Baseline:  Goal status: INITIAL  3.  Pt will consume least restrictive diet with reduced overt s/s of aspiration or dysphagia to reduce risk of malnutrition, dehydration and pneumonia.  Baseline:  Goal status: INITIAL   ASSESSMENT:  CLINICAL IMPRESSION: Patient is a 78 y.o. right handed male who was seen today for clinical swallow evaluation and cognitive communication evaluation.  Pt presents with adequate oropharyngeal abilities when consuming thin liquids and regular textures (graham crackers).    Suspect pt's intermittent dysphagia is related to overall cognitive decline. As such, assessment provided of cognitive communicaiton abilities. Pt's wife describes  gradual memory loss over the last several years with worsening abilities over the last several months  "really since April, they took him off of mind altering medicines" in the hospital during his admission at [facility] (04/24 thru 10/02/2023). She has not noticed any improvement in pt ability.    Confusion is especially present in pt's sleeping patterns. She states "he is up and down thorughout the night, gets up at 11:30 pm to perk the coffee" and is not able to be redirected, he doesn't understand that it is nighttime, this confusion also results in moving to spare bedroom and then the couch and back to their bed.   Functionally, pt is now dependent on his wife for medication management, reports that his attempts to balance their checkbook has resulted as "now it is screwedup," pt unable to recall recommendation by PCP to cease driving until completion of assessments "well, I drove over here." While pt's wife recalls this information she reports being relunctant to remind him and offer driving because "it would result in a fuss, he has always had control of everything."  Formally pt presents with a moderate amnestic cognitive impairment with severe deficits in short term memory, retrieval of information, prospective memory, awareness of impairments/deficits, moderate deficits in problem solving and reasoning, severe executive function deficits.    In light of the above deficits, would recommend continued ST sessions to target education on safety, current cognitive impairments, progressive process associated with current dx of dementia and to promote safe opportunities for pt to engage in cognitive activities. Pt and his wife were agreeable and this Clinical research associate will seek certification for such.   While his wife voices recall of information, she voices relunctance to remind/enforce recommendations as "he has always been in control and he doesn't like it when he isn't."  May need to recruit additional family members into education regarding cognitive impairment and safety.    OBJECTIVE IMPAIRMENTS include  attention, memory, awareness, and executive functioning. These impairments are limiting patient from managing medications, managing appointments, managing finances, household responsibilities, and ADLs/IADLs. Factors affecting potential to achieve goals and functional outcome are ability to learn/carryover information, co-morbidities, medical prognosis, previous level of function, severity of impairments, and family/community support. Patient will benefit from skilled SLP services to address above impairments and improve overall function.  REHAB POTENTIAL: Good  PLAN: SLP FREQUENCY: 1-2x/week  SLP DURATION: 12 weeks  PLANNED INTERVENTIONS: Aspiration precaution training, Environmental controls, Cognitive reorganization, Internal/external aids, Functional tasks, SLP instruction and feedback, Compensatory strategies, and Patient/family education   [employer] B. Garlin Junker, M.S., CCC-SLP, Tree surgeon Certified Brain Injury Specialist [facility]  [facility] Office [phone] Ascom [phone] Fax [phone]

## 2023-10-25 ENCOUNTER — Ambulatory Visit: Admitting: Speech Pathology

## 2023-10-25 DIAGNOSIS — J189 Pneumonia, unspecified organism: Secondary | ICD-10-CM | POA: Diagnosis not present

## 2023-10-25 DIAGNOSIS — R1312 Dysphagia, oropharyngeal phase: Secondary | ICD-10-CM | POA: Diagnosis not present

## 2023-10-25 DIAGNOSIS — R41841 Cognitive communication deficit: Secondary | ICD-10-CM

## 2023-10-25 NOTE — Therapy (Signed)
 OUTPATIENT SPEECH LANGUAGE PATHOLOGY  COGNITION TREATMENT   Patient Name: Karl Myers MRN: [id number] DOB:1945-08-16, 78 y.o., male Today's Date: 10/25/2023  PCP: Jacklin Mascot, MD REFERRING PROVIDER: Jacklin Mascot, MD   End of Session - 10/25/23 1134     Visit Number 3    Number of Visits 25    Date for SLP Re-Evaluation 01/10/24    Authorization Type [employer] [facility] Medicare    Progress Note Due on Visit 10    SLP Start Time 1145    SLP Stop Time  1230    SLP Time Calculation (min) 45 min    Activity Tolerance Patient tolerated treatment well             Past Medical History:  Diagnosis Date   Actinic keratosis    Acute otitis media 05/08/2023   Arthritis    COPD (chronic obstructive pulmonary disease) (HCC)    DDD (degenerative disc disease), cervical    DDD (degenerative disc disease), cervical    Dementia (HCC)    Depression    History of COVID-19    Hypertension    Melanoma (HCC) 09/04/2019   R 5th toe lateral tip, Stage IV, 0.6mm Breslow's   Pre-diabetes    Past Surgical History:  Procedure Laterality Date   BACK SURGERY     x 3   CARPAL TUNNEL RELEASE Right 08/19/2017   Procedure: CARPAL TUNNEL RELEASE;  Surgeon: Molli Angelucci, MD;  Location: [facility] ORS;  Service: Orthopedics;  Laterality: Right;   CATARACT EXTRACTION W/PHACO Right 11/17/2016   Procedure: CATARACT EXTRACTION PHACO AND INTRAOCULAR LENS PLACEMENT (IOC);  Surgeon: Clair Crews, MD;  Location: [facility] ORS;  Service: Ophthalmology;  Laterality: Right;  US  00:37 AP% 12.6 CDE 4.69 Fluid pack lot # [id number]   CATARACT EXTRACTION W/PHACO Left 12/08/2016   Procedure: CATARACT EXTRACTION PHACO AND INTRAOCULAR LENS PLACEMENT (IOC);  Surgeon: Clair Crews, MD;  Location: [facility] ORS;  Service: Ophthalmology;  Laterality: Left;  US  00:33 AP% 13.9 CDE 4.63 Fluid pack lot # [id number]   TOE AMPUTATION Left    ULNAR TUNNEL RELEASE Right 08/19/2017   Procedure: CUBITAL TUNNEL RELEASE;   Surgeon: Molli Angelucci, MD;  Location: [facility] ORS;  Service: Orthopedics;  Laterality: Right;   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   Low blood magnesium  10/07/2023   Insomnia 10/07/2023   Tobacco use disorder, continuous 10/07/2023   Tobacco abuse counseling 10/07/2023   Left sided lacunar stroke (HCC) 10/07/2023   Dementia (HCC) 09/14/2023   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   Adrenal nodule (HCC) 08/16/2023   Pneumonia due to infectious organism 06/15/2023   COVID-19 06/09/2023   COPD exacerbation (HCC) 06/08/2023   Depression 06/08/2023   Acute hypoxic respiratory failure (HCC) 06/08/2023   Weakness 06/08/2023   COVID-19 virus infection 06/08/2023   Impacted cerumen of left ear 05/08/2023   Actinic keratosis 05/08/2023   PAD (peripheral artery disease) (HCC) 04/09/2023   Lumbar radiculopathy 03/15/2023   Fear of falling 12/09/2022   Headache 07/27/2022   Macrocytosis 03/26/2022   Low serum vitamin B12 03/26/2022   Elevated MCV 02/03/2022   Memory difficulty 01/05/2022   Prediabetes 01/05/2022   Right knee pain 07/07/2021   History of melanoma 10/13/2019   Atrial fibrillation (HCC) 08/01/2019   Low TSH level 08/01/2019   COPD (chronic obstructive pulmonary disease) (HCC)    Anxiety    AKI (acute kidney injury) (HCC)  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   Family history of prostate cancer in father 06/17/2018   CAD (coronary artery disease) 07/27/2017   Tobacco abuse 06/24/2017   Hypertension 03/12/2017   Hyperlipidemia 03/12/2017   Fatigue 03/12/2017   Sleeping difficulty 03/12/2017   Cervical radiculopathy 03/12/2017    ONSET DATE: date of referral  10/07/2023  REFERRING DIAG: J18.9 (ICD-10-CM) - Pneumonia of left lower lobe due to infectious organism: left sided lacunar infarction - I63.81 Dementia, unspecified dementia severity, unspecified dementia type, unspecified  whether behavioral, psychotic, or mood disturbance or anxiety (F03.90)  THERAPY DIAG:  Cognitive communication deficit  Rationale for Evaluation and Treatment Rehabilitation  SUBJECTIVE:   PERTINENT HISTORY and DIAGNOSTIC FINDINGS:  Pt is a right handed 78 year old male with   12/09/2022 Memory difficulty, Chronic issue.  Likely progressing.  Patient likely has dementia given his slums score (19/30).    06/13/2023 & 08/08/2023 Pneumonia of left lower lobe due to infectious organism  09/30/2023 thru 10/02/2023 Pt admitted to [facility] d/t delirium. Per chart "Pt with history of cognitive impairment with 1 month of worsening confusion and increased restlessness over the past 3 days, as well as "shuffling gait" over the past few months. CT head w/o acute findings, notable for global cerebral atrophy, chronic small vessel ischemia dz, chronic lacunar infarct in L basal ganglia."  Per pt's wife, pt with choking episode following discharge therefore dysphagia evaluation was requested.    PAIN:  Are you having pain? No   FALLS: Has patient fallen in last 6 months?  No  LIVING ENVIRONMENT: Lives with: lives with their spouse Lives in: House/apartment  PLOF:  Level of assistance: Independent with ADLs, Independent with IADLs Employment: Retired   PATIENT GOALS   to reduce choking risks, pt's wife concerned about recent decline in sleeping and cognition related to dx of dementia  SUBJECTIVE STATEMENT: Pt pleasant, both eager  Pt accompanied by: significant other  OBJECTIVE:   TODAY'S TREATMENT:  Skilled ST session targeted pt's cognitive communication deficits. SLP facilitated the session by providing the following interventions:  Pt and his wife continue to report inability to sleep and wondering at night. Pt is not able to be redirected - his wife reports "I listen to know where he is - last night at 1:30 am he was in his building messing." Hallucinations "come and go" at night.    His is reliant on his wife to give him his medicines but she reports "He tells me that they are wrong but still takes them when he just ready" so he might set them on the arm of the chair.   Information provided on advance directives with paperwork provided, especially for pt's wife to have someone who could understand and voice her desires if in a medical emergency.   Pt and his wife had questions about upcoming vacation to beach in July. Education provided on potential that pt's confusion might increase in an unfamiliar environment and his wife reports that he is "already anxious and asking if we are going to leave him there."   PATIENT EDUCATION: Education details: see above Person educated: Patient and Spouse Education method: Explanation Education comprehension: needs further education   HOME EXERCISE PROGRAM:   Begin recruiting assistance for increased supervision at night   GOALS:  Goals reviewed with patient? Yes  SHORT TERM GOALS: Target date: 10 sessions   With Min A, patient will recall functional verbal information 75% accuracy with use of compensations/strategies (repeats, rephrasing,  notetaking, etc).  Baseline: Goal status: INITIAL  2.  With Min A, patient will demonstrate knowledge of appropriate activities to support cognitive and  language function outside of ST with assistance from family.   Baseline:  Goal status: INITIAL  3.  With Min A, given a functional problem a scenario from daily life, patient will demonstrate error awareness and correct errors  with at least 75% accuracy.  Baseline:  Goal status: INITIAL   LONG TERM GOALS: Target date: 01/10/2024  With Rare Min A, patient will use strategies to improve memory for important information with 90% acc. Independently (ie.,daily planner/calendar, lists).   Baseline:  Goal status: INITIAL  2.  With Min A, patient/family will demonstrate understanding of the following concepts: Dementia, impact on  cognition, safety awareness, strengths/strategies to promote success, local resources by answering multiple choice questions with 75% accuracy when provided supported conversation in order to increase patient's participation in medical care.  Baseline:  Goal status: INITIAL  3.  Pt will consume least restrictive diet with reduced overt s/s of aspiration or dysphagia to reduce risk of malnutrition, dehydration and pneumonia.  Baseline:  Goal status: INITIAL   ASSESSMENT:  CLINICAL IMPRESSION: Patient is a 78 y.o. right handed male who was seen today for clinical swallow evaluation and cognitive communication evaluation.  Pt presents with adequate oropharyngeal abilities when consuming thin liquids and regular textures (graham crackers).    Suspect pt's intermittent dysphagia is related to overall cognitive decline. As such, assessment provided of cognitive communicaiton abilities. Pt's wife describes gradual memory loss over the last several years with worsening abilities over the last several months "really since April, they took him off of mind altering medicines" in the hospital during his admission at [facility] (04/24 thru 10/02/2023). She has not noticed any improvement in pt ability.    Confusion is especially present in pt's sleeping patterns. She states "he is up and down thorughout the night, gets up at 11:30 pm to perk the coffee" and is not able to be redirected, he doesn't understand that it is nighttime, this confusion also results in moving to spare bedroom and then the couch and back to their bed.   Functionally, pt is now dependent on his wife for medication management, reports that his attempts to balance their checkbook has resulted as "now it is screwedup," pt unable to recall recommendation by PCP to cease driving until completion of assessments "well, I drove over here." While pt's wife recalls this information she reports being relunctant to remind him and offer driving because "it  would result in a fuss, he has always had control of everything."  Formally pt presents with a moderate amnestic cognitive impairment with severe deficits in short term memory, retrieval of information, prospective memory, awareness of impairments/deficits, moderate deficits in problem solving and reasoning, severe executive function deficits.    In light of the above deficits, would recommend continued ST sessions to target education on safety, current cognitive impairments, progressive process associated with current dx of dementia and to promote safe opportunities for pt to engage in cognitive activities. Pt and his wife were agreeable and this Clinical research associate will seek certification for such.   See the above treatment note for details of today's session.   OBJECTIVE IMPAIRMENTS include attention, memory, awareness, and executive functioning. These impairments are limiting patient from managing medications, managing appointments, managing finances, household responsibilities, and ADLs/IADLs. Factors affecting potential to achieve goals and functional outcome are ability to learn/carryover information, co-morbidities, medical prognosis, previous  level of function, severity of impairments, and family/community support. Patient will benefit from skilled SLP services to address above impairments and improve overall function.  REHAB POTENTIAL: Good  PLAN: SLP FREQUENCY: 1-2x/week  SLP DURATION: 12 weeks  PLANNED INTERVENTIONS: Aspiration precaution training, Environmental controls, Cognitive reorganization, Internal/external aids, Functional tasks, SLP instruction and feedback, Compensatory strategies, and Patient/family education   Eufemio Strahm B. Garlin Junker, M.S., CCC-SLP, Tree surgeon Certified Brain Injury Specialist [facility]  [facility] Office [phone] Ascom [phone] Fax [phone]

## 2023-10-26 ENCOUNTER — Encounter: Payer: Self-pay | Admitting: Pulmonary Disease

## 2023-10-26 ENCOUNTER — Ambulatory Visit: Admitting: Pulmonary Disease

## 2023-10-26 VITALS — BP 122/60 | HR 65 | Temp 97.6°F | Ht 67.0 in | Wt 155.4 lb

## 2023-10-26 DIAGNOSIS — J449 Chronic obstructive pulmonary disease, unspecified: Secondary | ICD-10-CM | POA: Diagnosis not present

## 2023-10-26 DIAGNOSIS — I4819 Other persistent atrial fibrillation: Secondary | ICD-10-CM | POA: Diagnosis not present

## 2023-10-26 DIAGNOSIS — F0390 Unspecified dementia without behavioral disturbance: Secondary | ICD-10-CM | POA: Diagnosis not present

## 2023-10-26 DIAGNOSIS — R0602 Shortness of breath: Secondary | ICD-10-CM

## 2023-10-26 DIAGNOSIS — F17210 Nicotine dependence, cigarettes, uncomplicated: Secondary | ICD-10-CM

## 2023-10-26 MED ORDER — ALBUTEROL SULFATE HFA 108 (90 BASE) MCG/ACT IN AERS: 2.0000 | INHALATION_SPRAY | Freq: Four times a day (QID) | RESPIRATORY_TRACT | 2 refills | Status: AC | PRN

## 2023-10-26 MED ORDER — TRELEGY ELLIPTA 100-62.5-25 MCG/ACT IN AEPB: 1.0000 | INHALATION_SPRAY | Freq: Every day | RESPIRATORY_TRACT | 0 refills | Status: DC

## 2023-10-26 NOTE — Progress Notes (Signed)
 Subjective:    Patient ID: Karl Myers, male    DOB: [birth year], 78 y.o.   MRN: [id number]  Patient Care Team: Bair, Kalpana, MD as PCP - General (Family Medicine) Devorah Fonder, MD as PCP - Cardiology (Cardiology) Timmy Forbes, MD as Consulting Physician (Oncology)  Chief Complaint  Patient presents with   Follow-up    Dry cough x a few weeks. Shortness of breath on exertion.    BACKGROUND: Patient is a 78 year old current smoker who has been followed here for the issue of moderate COPD.  He has not been seen in the clinic since 03 December 2021.  He presents today with a complaint of dry cough and shortness of breath on exertion.   HPI Discussed the use of AI scribe software for clinical note transcription with the patient, who gave verbal consent to proceed.  History of Present Illness   [name] "Karl Myers" is a 78 year old male with COPD and atrial fibrillation who presents with dyspnea. He was referred by his family doctor for evaluation of his respiratory issues.  He has experienced dyspnea since his last clinic visit two years ago, accompanied by shortness of breath and wheezing. He has a history of COPD and uses an inhaler (albuterol ).  He "ran out" of Trelegy which was effective.  He continues to smoke cigarettes, which may exacerbate his symptoms. No phlegm production, significant weight loss, or loss of appetite is noted, with a current weight of 155 pounds, slightly down from his usual 160 pounds.  Earlier this year, he was diagnosed with pneumonia and treated with inhalers. He also reports a recent diagnosis of a small aneurysm about three weeks ago. Additionally, he has a history of a mild stroke and dementia.  He has atrial fibrillation and acknowledges an irregular heartbeat.  No chest pain, no orthopnea or paroxysmal nocturnal dyspnea.  He experiences sleep disturbances, often lying in bed for 15-20 minutes before needing to get up usually to go to the  bathroom. He does not engage in much physical activity, and his walking pace is slow.      DATA 08/19/2021 PFTs: FEV1 1.61 L or 57% predicted, FVC 2.57 L or 65% predicted, FEV1/FVC 63%, mild concomitant restrictive defect, very mild diffusion defect.  Consistent with moderate obstructive defect on the basis of COPD, concomitant restrictive defect may be effort driven as the patient did have difficulty performing the test other possibilities include smoking-related interstitial lung disease. 09/09/2021 chest LDCT: Centrilobular emphysema, linear opacity in the lingula due to scarring or atelectasis, numerous solid pulmonary nodules bilaterally largest located in right lower lobe measuring 7.2 mm, lungs RADS 3S, follow-up in 6 months recommended.  Review of Systems A 10 point review of systems was performed and it is as noted above otherwise negative.   Past Medical History:  Diagnosis Date   Actinic keratosis    Acute otitis media 05/08/2023   Arthritis    COPD (chronic obstructive pulmonary disease) (HCC)    DDD (degenerative disc disease), cervical    DDD (degenerative disc disease), cervical    Dementia (HCC)    Depression    History of COVID-19    Hypertension    Melanoma (HCC) 09/04/2019   R 5th toe lateral tip, Stage IV, 0.13mm Breslow's   Pre-diabetes     Past Surgical History:  Procedure Laterality Date   BACK SURGERY     x 3   CARPAL TUNNEL RELEASE Right 08/19/2017   Procedure: CARPAL TUNNEL  RELEASE;  Surgeon: Molli Angelucci, MD;  Location: [facility] ORS;  Service: Orthopedics;  Laterality: Right;   CATARACT EXTRACTION W/PHACO Right 11/17/2016   Procedure: CATARACT EXTRACTION PHACO AND INTRAOCULAR LENS PLACEMENT (IOC);  Surgeon: Clair Crews, MD;  Location: [facility] ORS;  Service: Ophthalmology;  Laterality: Right;  US  00:37 AP% 12.6 CDE 4.69 Fluid pack lot # [id number]   CATARACT EXTRACTION W/PHACO Left 12/08/2016   Procedure: CATARACT EXTRACTION PHACO AND INTRAOCULAR LENS  PLACEMENT (IOC);  Surgeon: Clair Crews, MD;  Location: [facility] ORS;  Service: Ophthalmology;  Laterality: Left;  US  00:33 AP% 13.9 CDE 4.63 Fluid pack lot # [id number]   TOE AMPUTATION Left    ULNAR TUNNEL RELEASE Right 08/19/2017   Procedure: CUBITAL TUNNEL RELEASE;  Surgeon: Molli Angelucci, MD;  Location: [facility] ORS;  Service: Orthopedics;  Laterality: Right;    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   Low blood magnesium  10/07/2023   Insomnia 10/07/2023   Tobacco use disorder, continuous 10/07/2023   Tobacco abuse counseling 10/07/2023   Left sided lacunar stroke (HCC) 10/07/2023   Dementia (HCC) 09/14/2023   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   Adrenal nodule (HCC) 08/16/2023   Pneumonia due to infectious organism 06/15/2023   COVID-19 06/09/2023   COPD exacerbation (HCC) 06/08/2023   Depression 06/08/2023   Acute hypoxic respiratory failure (HCC) 06/08/2023   Weakness 06/08/2023   COVID-19 virus infection 06/08/2023   Impacted cerumen of left ear 05/08/2023   Actinic keratosis 05/08/2023   PAD (peripheral artery disease) (HCC) 04/09/2023   Lumbar radiculopathy 03/15/2023   Fear of falling 12/09/2022   Headache 07/27/2022   Macrocytosis 03/26/2022   Low serum vitamin B12 03/26/2022   Elevated MCV 02/03/2022   Memory difficulty 01/05/2022   Prediabetes 01/05/2022   Right knee pain 07/07/2021   History of melanoma 10/13/2019   Atrial fibrillation (HCC) 08/01/2019   Low TSH level 08/01/2019   COPD (chronic obstructive pulmonary disease) (HCC)    Anxiety    AKI (acute kidney injury) (HCC)    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   Family history of prostate cancer in father 06/17/2018   CAD (coronary artery disease) 07/27/2017   Tobacco abuse 06/24/2017   Hypertension 03/12/2017   Hyperlipidemia 03/12/2017   Fatigue 03/12/2017   Sleeping difficulty 03/12/2017   Cervical  radiculopathy 03/12/2017    Family History  Problem Relation Age of Onset   Heart attack Mother    Prostate cancer Father    Breast cancer Sister 78   Dementia Sister    Cancer Brother        unknown what kind    Social History   Tobacco Use   Smoking status: Every Day    Current packs/day: 0.25    Average packs/day: 0.3 packs/day for 65.0 years (16.3 ttl pk-yrs)    Types: Cigars, Cigarettes   Smokeless tobacco: Never   Tobacco comments:    0.5PPD 12/07/2022  Substance Use Topics   Alcohol use: No    Allergies  Allergen Reactions   Hydrocodone-Acetaminophen  Other (See Comments)    With large quantities "hyper"   Neurontin  [Gabapentin ] Other (See Comments)    With large quantities "hyper"     Current Meds  Medication Sig   albuterol  (VENTOLIN  HFA) 108 (90 Base) MCG/ACT inhaler Inhale 2 puffs into the lungs every 6 (six) hours as needed for wheezing or shortness of breath.   apixaban  (ELIQUIS ) 5 MG  TABS tablet TAKE ONE TABLET BY MOUTH TWICE DAILY   atenolol  (TENORMIN ) 25 MG tablet Take 1 tablet (25 mg total) by mouth at bedtime.   atorvastatin  (LIPITOR) 80 MG tablet Take 1 tablet (80 mg total) by mouth daily.   Cholecalciferol (VITAMIN D3) 25 MCG (1000 UT) CAPS Take by mouth.   cyanocobalamin  (VITAMIN B12) 1000 MCG tablet Take 1 tablet (1,000 mcg total) by mouth daily.   Fluticasone -Umeclidin-Vilant (TRELEGY ELLIPTA ) 100-62.5-25 MCG/ACT AEPB Inhale 1 Inhalation into the lungs daily in the afternoon.   Magnesium  200 MG TABS Take 1 tablet by mouth daily in the afternoon.   meloxicam (MOBIC) 15 MG tablet Take 15 mg by mouth daily.   nitroGLYCERIN  (NITROSTAT ) 0.4 MG SL tablet Place 1 tablet (0.4 mg total) under the tongue every 5 (five) minutes as needed for chest pain. For up to 3 doses per episode.   valsartan  (DIOVAN ) 320 MG tablet Take 320 mg by mouth daily.   Current Facility-Administered Medications for the 10/26/23 encounter (Office Visit) with Marc Senior, MD   Medication   magnesium  oxide (MAG-OX) tablet 200 mg    Immunization History  Administered Date(s) Administered   Fluad Quad(high Dose 65+) 02/20/2019, 03/07/2020, 04/21/2021   Fluad Trivalent(High Dose 65+) 03/15/2023   Influenza, High Dose Seasonal PF 03/12/2017, 03/31/2018   Influenza-Unspecified 02/20/2019   PFIZER(Purple Top)SARS-COV-2 Vaccination 10/27/2019, 12/01/2019   PNEUMOCOCCAL CONJUGATE-20 09/11/2022   Td 01/14/2016   Zoster Recombinant(Shingrix) 12/03/2020, 03/07/2021, 04/07/2021        Objective:     BP 122/60 (BP Location: Left Arm, Patient Position: Sitting, Cuff Size: Normal)   Pulse 65   Temp 97.6 F (36.4 C) (Temporal)   Ht 5\' 7"  (1.702 m)   Wt 155 lb 6.4 oz (70.5 kg)   SpO2 96%   BMI 24.34 kg/m   SpO2: 96 %  GENERAL: Well, well-nourished gentleman, no acute distress, fully ambulatory.  No conversational dyspnea. HEAD: Normocephalic, atraumatic.  EYES: Pupils equal, round, reactive to light.  No scleral icterus.  MOUTH: Dentures uppers and lowers, oral mucosa moist.  No thrush. NECK: Supple. No thyromegaly. Trachea midline. No JVD.  No adenopathy. PULMONARY: Good air entry bilaterally.  Coarse, otherwise, no adventitious sounds. CARDIOVASCULAR: S1 and S2.  Irregular rate and rhythm with controlled ventricular response.  No rubs, murmurs or gallops heard. ABDOMEN: Benign. MUSCULOSKELETAL: No joint deformity, no clubbing, no edema.  NEUROLOGIC: No focal deficit, no gait disturbance, speech is fluent. SKIN: Intact,warm,dry. PSYCH: Impaired short-term memory.    Assessment & Plan:     ICD-10-CM   1. Stage 2 moderate COPD by GOLD classification (HCC)  J44.9     2. Shortness of breath  R06.02     3. Persistent atrial fibrillation (HCC)  I48.19     4. Tobacco dependence due to cigarettes  F17.210     5. Dementia, unspecified dementia severity, unspecified dementia type, unspecified whether behavioral, psychotic, or mood disturbance or anxiety  (HCC)  F03.90      Meds ordered this encounter  Medications   albuterol  (VENTOLIN  HFA) 108 (90 Base) MCG/ACT inhaler    Sig: Inhale 2 puffs into the lungs every 6 (six) hours as needed for wheezing or shortness of breath.    Dispense:  8 g    Refill:  2   Fluticasone -Umeclidin-Vilant (TRELEGY ELLIPTA ) 100-62.5-25 MCG/ACT AEPB    Sig: Inhale 1 Inhalation into the lungs daily in the afternoon.    Dispense:  2 each    Refill:  0    Lot Number?:   [id number]    Expiration Date?:   01/06/2025    Quantity:   2   Discussion:    Chronic Obstructive Pulmonary Disease (COPD) Significant COPD with dyspnea and cough, exacerbated by continued smoking. History of pneumonia earlier this year. Previously managed well with Trelegy and albuterol  inhalers. - Provide Trelegy inhaler samples for one month, instruct to use one puff daily and rinse mouth after use - Prescribe albuterol  inhaler for emergency use, send prescription to [facility] - Discuss potential cost savings with [facility] for medication supply - Check overnight oximetry to assess oxygen levels during sleep  Dementia Dementia contributing to sleep disturbances, possibly related to hypoxia. Plan to check overnight oximetry during sleep.  Atrial Fibrillation Irregular heartbeat consistent with atrial fibrillation.  Mild Stroke Mild stroke with no acute management changes required at this time.  Follow-up Follow-up care and monitoring discussed. - Schedule follow-up appointment in two months - Do not perform breathing test at this time due to current condition (patient will not be able to follow instructions well)    Advised if symptoms do not improve or worsen, to please contact office for sooner follow up or seek emergency care.    I spent 40 minutes of dedicated to the care of this patient on the date of this encounter to include pre-visit review of records, face-to-face time with the patient discussing conditions above, post visit  ordering of testing, clinical documentation with the electronic health record, making appropriate referrals as documented, and communicating necessary findings to members of the patients care team.   C. Chloe Counter, MD Advanced Bronchoscopy PCCM [facility]    *This note was dictated using voice recognition software/Dragon.  Despite best efforts to proofread, errors can occur which can change the meaning. Any transcriptional errors that result from this process are unintentional and may not be fully corrected at the time of dictation.

## 2023-10-26 NOTE — Patient Instructions (Signed)
 VISIT SUMMARY:  Today, you were seen for your ongoing respiratory issues, including shortness of breath and wheezing, which are related to your COPD and atrial fibrillation. We also discussed your recent diagnosis of an aneurysm and your history of a mild stroke and dementia.  YOUR PLAN:  -CHRONIC OBSTRUCTIVE PULMONARY DISEASE (COPD): COPD is a chronic lung disease that makes it hard to breathe. You will continue using the Trelegy inhaler, one puff daily, and rinse your mouth after each use. You will also have an albuterol  inhaler for emergencies. We will check your oxygen levels during sleep to ensure they are adequate.  -DEMENTIA: Dementia is a condition that affects memory and thinking. It may be contributing to your sleep disturbances. We will check your oxygen levels during sleep to see if low oxygen is a factor.  -ATRIAL FIBRILLATION: Atrial fibrillation is an irregular heartbeat. No changes to your current management are needed at this time.  -MILD STROKE: A mild stroke is a temporary blockage of blood flow to the brain. No new treatments are needed right now.  -ANEURYSM: An aneurysm is a bulge in a blood vessel. No new treatments are needed right now.  INSTRUCTIONS:  Please schedule a follow-up appointment in two months. We will not perform a breathing test at this time due to your current condition.

## 2023-10-27 ENCOUNTER — Ambulatory Visit: Admitting: Speech Pathology

## 2023-10-27 ENCOUNTER — Other Ambulatory Visit: Payer: Self-pay

## 2023-10-27 DIAGNOSIS — R41841 Cognitive communication deficit: Secondary | ICD-10-CM

## 2023-10-27 DIAGNOSIS — R1312 Dysphagia, oropharyngeal phase: Secondary | ICD-10-CM | POA: Diagnosis not present

## 2023-10-27 DIAGNOSIS — J189 Pneumonia, unspecified organism: Secondary | ICD-10-CM | POA: Diagnosis not present

## 2023-10-27 NOTE — Transitions of Care (Post Inpatient/ED Visit) (Signed)
 Transition of Care week 4  Visit Note  10/27/2023  Name: Karl Myers MRN: [id number]          DOB: [birth year]  Situation: Patient enrolled in [facility] 30-day program. Visit completed with Hewitt Lou and Ralene Burger by telephone.   Background:     Past Medical History:  Diagnosis Date   Actinic keratosis    Acute otitis media 05/08/2023   Arthritis    COPD (chronic obstructive pulmonary disease) (HCC)    DDD (degenerative disc disease), cervical    DDD (degenerative disc disease), cervical    Dementia (HCC)    Depression    History of COVID-19    Hypertension    Melanoma (HCC) 09/04/2019   R 5th toe lateral tip, Stage IV, 0.45mm Breslow's   Pre-diabetes     Assessment: Patient Reported Symptoms: Cognitive Cognitive Status: Alert and oriented to person, place, and time, Struggling with memory recall, Requires Assistance Decision Making   Health Maintenance Behaviors: Annual physical exam Healing Pattern: Average Health Facilitated by: Rest  Neurological Neurological Review of Symptoms: No symptoms reported Neurological Conditions: Dementia Neurological Management Strategies: Coping strategies Neurological Self-Management Outcome: 3 (uncertain) Neurological Comment: Neurology 5/29  HEENT HEENT Symptoms Reported: No symptoms reported      Cardiovascular Cardiovascular Symptoms Reported: No symptoms reported Does patient have uncontrolled Hypertension?: No Weight: 155 lb (70.3 kg) Cardiovascular Self-Management Outcome: 4 (good)  Respiratory Respiratory Symptoms Reported: No symptoms reported Additional Respiratory Details: Smoker Respiratory Conditions: COPD Respiratory Self-Management Outcome: 2 (bad) Respiratory Comment: Pulmonolgy appt. Lungs are "bad". Started on an inhaler  Endocrine Patient reports the following symptoms related to hypoglycemia or hyperglycemia : No symptoms reported Is patient diabetic?: Yes Is patient checking blood sugars at home?:  Yes Endocrine Conditions: Diabetes Endocrine Self-Management Outcome: 4 (good)  Gastrointestinal Gastrointestinal Symptoms Reported: No symptoms reported   Nutrition Risk Screen (CP): No indicators present  Genitourinary Genitourinary Symptoms Reported: Incontinence Additional Genitourinary Details: Occasional Genitourinary Conditions: Incontinence Genitourinary Management Strategies: Coping strategies Genitourinary Self-Management Outcome: 4 (good) Genitourinary Comment: Occasinal Incontinence  Integumentary Integumentary Symptoms Reported: No symptoms reported    Musculoskeletal Musculoskelatal Symptoms Reviewed: No symptoms reported   Falls in the past year?: Yes Number of falls in past year: 2 or more Was there an injury with Fall?: No Fall Risk Category Calculator: 2 Patient Fall Risk Level: Moderate Fall Risk Patient at Risk for Falls Due to: History of fall(s) Fall risk Follow up: Falls evaluation completed  Psychosocial Psychosocial Symptoms Reported: Depression - if selected complete PHQ 2-9 Behavioral Health Conditions: Depression       There were no vitals filed for this visit.  Medications Reviewed Today     Reviewed by Claudene Crystal, RN (Case Manager) on 10/27/23 at 1359  Med List Status: <None>   Medication Order Taking? Sig Documenting Provider Last Dose Status Informant  albuterol  (VENTOLIN  HFA) 108 (90 Base) MCG/ACT inhaler [id number]  Inhale 2 puffs into the lungs every 6 (six) hours as needed for wheezing or shortness of breath. Marc Senior, MD  Active   apixaban  (ELIQUIS ) 5 MG TABS tablet [id number]  TAKE ONE TABLET BY MOUTH TWICE DAILY Gollan, Timothy J, MD  Active Self, Spouse/Significant Other, Pharmacy Records  atenolol  (TENORMIN ) 25 MG tablet [id number]  Take 1 tablet (25 mg total) by mouth at bedtime. Kent Pear, MD  Active Self, Spouse/Significant Other, Pharmacy Records  atorvastatin  (LIPITOR) 80 MG tablet [id number]  Take 1 tablet (80  mg total) by mouth daily. Tullo,  Ray Caffey, MD  Active   Cholecalciferol (VITAMIN D3) 25 MCG (1000 UT) CAPS [id number]  Take by mouth. [provider]  Active   cyanocobalamin  (VITAMIN B12) 1000 MCG tablet [id number]  Take 1 tablet (1,000 mcg total) by mouth daily. Timmy Forbes, MD  Active Self, Spouse/Significant Other, Pharmacy Records  Fluticasone -Umeclidin-Vilant (TRELEGY ELLIPTA ) 100-62.5-25 MCG/ACT AEPB [id number] Yes Inhale 1 puff into the lungs daily. Faith Homes, MD Taking Consider Medication Status and Discontinue (Duplicate)   Fluticasone -Umeclidin-Vilant (TRELEGY ELLIPTA ) 100-62.5-25 MCG/ACT AEPB [id number]  Inhale 1 Inhalation into the lungs daily in the afternoon. Marc Senior, MD  Active   Magnesium  200 MG TABS [id number]  Take 1 tablet by mouth daily in the afternoon. [provider]  Active   magnesium  oxide (MAG-OX) tablet 200 mg [id number]   Bair, Kalpana, MD  Active   meloxicam (MOBIC) 15 MG tablet [id number]  Take 15 mg by mouth daily. [provider]  Active   nitroGLYCERIN  (NITROSTAT ) 0.4 MG SL tablet [id number]  Place 1 tablet (0.4 mg total) under the tongue every 5 (five) minutes as needed for chest pain. For up to 3 doses per episode. Kent Pear, MD  Active Self, Spouse/Significant Other, Pharmacy Records  valsartan  (DIOVAN ) 320 MG tablet [id number]  Take 320 mg by mouth daily. [provider]  Active             Recommendation:   Continue with not driving The patient and family are aware that he sundowns. Recommend decreasing anxiety at the end of the day Activities that stimulate the brain, like coloring Neurology appointment on 5/29  Follow Up Plan:   Telephone follow-up in 1 week  Gareld June, BSN, RN [place]  VBCI - [facility] RN Care Manager [phone]

## 2023-10-27 NOTE — Patient Instructions (Signed)
 Visit Information  Thank you for taking time to visit with me today. Please don't hesitate to contact me if I can be of assistance to you before our next scheduled telephone appointment.  Our next appointment is by telephone on Friday May 30th at 12:00pm  Following is a copy of your care plan:   Goals Addressed             This Visit's Progress    VBCI Transitions of Care (TOC) Care Plan       Problems: (reviewed 10/27/23) Recent Hospitalization for treatment of Dementia Limited social support: The spouse is the primary support and this is a new diagnosis for the patient   Goal: (reviewed 10/27/23) Over the next 30 days, the patient will not experience hospital readmission  Interventions: (reviewed 10/27/23)  Dementia: Evaluation of current treatment plan related to misuse of: Alzheimer's dementia Emotional Support Provided to patient/caregiver, Sleep assessment completed, Sleep hygiene recommendations and education provided, Consideration of in-home help encouraged , Discussed Health Care Power of Lula , and Advised to contact provider for new or worsening symptoms Driving test scheduled for 11/08/23 to assess competency ST referral for choking episode - Rule Out Aspiration (completed) Magnesium  oxide 200mg  daily Referral to Neurology - 11/04/23 Sleep study due to insomnia Referral to Pulmonology - 10/26/23 (completed)  Patient Self Care Activities: (reviewed 10/27/23) Attend all scheduled provider appointments Call pharmacy for medication refills 3-7 days in advance of running out of medications Call provider office for new concerns or questions  Notify RN Care Manager of [facility] call rescheduling needs Participate in Transition of Care Program/Attend [facility] scheduled calls Take medications as prescribed   Fall Precautions - The patient is on Apixaban . Educated on increased risk of bleeding  Plan:  Telephone follow up appointment with care management team member scheduled for:   Friday May 30th at 12:00pm        Patient verbalizes understanding of instructions and care plan provided today and agrees to view in [place]. Active MyChart status and patient understanding of how to access instructions and care plan via MyChart confirmed with patient.     The patient has been provided with contact information for the care management team and has been advised to call with any health related questions or concerns.   Please call the care guide team at [phone] if you need to cancel or reschedule your appointment.   Please call the Suicide and Crisis Lifeline: 988 call the [place]  National Suicide Prevention Lifeline: (587)254-1824 or TTY: 402-394-3334 TTY 930 718 1893) to talk to a trained counselor if you are experiencing a Mental Health or Behavioral Health Crisis or need someone to talk to.  Gareld June, BSN, RN [place]  VBCI - [employer] [facility] RN Care Manager [phone]

## 2023-10-27 NOTE — Therapy (Signed)
 OUTPATIENT SPEECH LANGUAGE PATHOLOGY  COGNITION TREATMENT   Patient Name: Karl Myers MRN: [id number] DOB:Jul 14, 1945, 78 y.o., male Today's Date: 10/27/2023  PCP: Jacklin Mascot, MD REFERRING PROVIDER: Jacklin Mascot, MD   End of Session - 10/27/23 1631     Visit Number 4    Number of Visits 25    Date for SLP Re-Evaluation 01/10/24    Authorization Type [employer] [facility] Medicare    Progress Note Due on Visit 10    SLP Start Time 1015    SLP Stop Time  1110    SLP Time Calculation (min) 55 min    Activity Tolerance Patient tolerated treatment well             Past Medical History:  Diagnosis Date   Actinic keratosis    Acute otitis media 05/08/2023   Arthritis    COPD (chronic obstructive pulmonary disease) (HCC)    DDD (degenerative disc disease), cervical    DDD (degenerative disc disease), cervical    Dementia (HCC)    Depression    History of COVID-19    Hypertension    Melanoma (HCC) 09/04/2019   R 5th toe lateral tip, Stage IV, 0.83mm Breslow's   Pre-diabetes    Past Surgical History:  Procedure Laterality Date   BACK SURGERY     x 3   CARPAL TUNNEL RELEASE Right 08/19/2017   Procedure: CARPAL TUNNEL RELEASE;  Surgeon: Molli Angelucci, MD;  Location: [facility] ORS;  Service: Orthopedics;  Laterality: Right;   CATARACT EXTRACTION W/PHACO Right 11/17/2016   Procedure: CATARACT EXTRACTION PHACO AND INTRAOCULAR LENS PLACEMENT (IOC);  Surgeon: Clair Crews, MD;  Location: [facility] ORS;  Service: Ophthalmology;  Laterality: Right;  US  00:37 AP% 12.6 CDE 4.69 Fluid pack lot # [id number]   CATARACT EXTRACTION W/PHACO Left 12/08/2016   Procedure: CATARACT EXTRACTION PHACO AND INTRAOCULAR LENS PLACEMENT (IOC);  Surgeon: Clair Crews, MD;  Location: [facility] ORS;  Service: Ophthalmology;  Laterality: Left;  US  00:33 AP% 13.9 CDE 4.63 Fluid pack lot # [id number]   TOE AMPUTATION Left    ULNAR TUNNEL RELEASE Right 08/19/2017   Procedure: CUBITAL TUNNEL RELEASE;   Surgeon: Molli Angelucci, MD;  Location: [facility] ORS;  Service: Orthopedics;  Laterality: Right;   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   Low blood magnesium  10/07/2023   Insomnia 10/07/2023   Tobacco use disorder, continuous 10/07/2023   Tobacco abuse counseling 10/07/2023   Left sided lacunar stroke (HCC) 10/07/2023   Dementia (HCC) 09/14/2023   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   Adrenal nodule (HCC) 08/16/2023   Pneumonia due to infectious organism 06/15/2023   COVID-19 06/09/2023   COPD exacerbation (HCC) 06/08/2023   Depression 06/08/2023   Acute hypoxic respiratory failure (HCC) 06/08/2023   Weakness 06/08/2023   COVID-19 virus infection 06/08/2023   Impacted cerumen of left ear 05/08/2023   Actinic keratosis 05/08/2023   PAD (peripheral artery disease) (HCC) 04/09/2023   Lumbar radiculopathy 03/15/2023   Fear of falling 12/09/2022   Headache 07/27/2022   Macrocytosis 03/26/2022   Low serum vitamin B12 03/26/2022   Elevated MCV 02/03/2022   Memory difficulty 01/05/2022   Prediabetes 01/05/2022   Right knee pain 07/07/2021   History of melanoma 10/13/2019   Atrial fibrillation (HCC) 08/01/2019   Low TSH level 08/01/2019   COPD (chronic obstructive pulmonary disease) (HCC)    Anxiety    AKI (acute kidney injury) (HCC)  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   Family history of prostate cancer in father 06/17/2018   CAD (coronary artery disease) 07/27/2017   Tobacco abuse 06/24/2017   Hypertension 03/12/2017   Hyperlipidemia 03/12/2017   Fatigue 03/12/2017   Sleeping difficulty 03/12/2017   Cervical radiculopathy 03/12/2017    ONSET DATE: date of referral  10/07/2023  REFERRING DIAG: J18.9 (ICD-10-CM) - Pneumonia of left lower lobe due to infectious organism: left sided lacunar infarction - I63.81 Dementia, unspecified dementia severity, unspecified dementia type, unspecified  whether behavioral, psychotic, or mood disturbance or anxiety (F03.90)  THERAPY DIAG:  Cognitive communication deficit  Rationale for Evaluation and Treatment Rehabilitation  SUBJECTIVE:   PERTINENT HISTORY and DIAGNOSTIC FINDINGS:  Pt is a right handed 78 year old male with   12/09/2022 Memory difficulty, Chronic issue.  Likely progressing.  Patient likely has dementia given his slums score (19/30).    06/13/2023 & 08/08/2023 Pneumonia of left lower lobe due to infectious organism  09/30/2023 thru 10/02/2023 Pt admitted to [facility] d/t delirium. Per chart "Pt with history of cognitive impairment with 1 month of worsening confusion and increased restlessness over the past 3 days, as well as "shuffling gait" over the past few months. CT head w/o acute findings, notable for global cerebral atrophy, chronic small vessel ischemia dz, chronic lacunar infarct in L basal ganglia."  Per pt's wife, pt with choking episode following discharge therefore dysphagia evaluation was requested.    PAIN:  Are you having pain? No   FALLS: Has patient fallen in last 6 months?  No  LIVING ENVIRONMENT: Lives with: lives with their spouse Lives in: House/apartment  PLOF:  Level of assistance: Independent with ADLs, Independent with IADLs Employment: Retired   PATIENT GOALS   to reduce choking risks, pt's wife concerned about recent decline in sleeping and cognition related to dx of dementia  SUBJECTIVE STATEMENT: Pt pleasant, both eager, son present for today's session Pt accompanied by: significant other; son attended session  OBJECTIVE:   TODAY'S TREATMENT:  Skilled ST session targeted pt's cognitive communication deficits. SLP facilitated the session by providing the following interventions:  Pt's son attended session today for education. SLP provided overview of cognitive impairment, severity and specific deficits. Pt continues to wander at night which includes going outside to his  "building" as well as smoking. Education provide don risks of wandering outside of house as they also report that pt has gotten in the car and driven as well as hazards of smoking unsupervised in the middle of the night. Pt provides that he doesn't have a smoke detector in his building. Recommend they install door alarms and/or deadbolts requiring a key and immediately purchase a smoke detector for pt's building. Continue to recommend supervision through the night until appt with neurology.   Education provided on neurodegenerative process involved with dx of dementia and continued need to plan for future (advanced directives and increase supervision and support in the home). Pt and his wife state that they have filled out advanced directives and will be getting them notarized.   Questions asked about upcoming summer vacation with education provided on need to ensure safety at night and potential for increased pt confusion in unfamiliar setting. Pt's son states that pt wandered on 2 separate nights the "last time we went even though we piled a bunch of stuff in front of the door, that didn't stop him."  Pt and his son had questions about upcoming driver's school and DMV paperwork. They state  that pt has not driven since instructed not to drive by this Clinical research associate. They asked this writer's opinion about surrendering license and not taking class. Given the severity of pt's global cognitive impairment and amnestic deficits, this Clinical research associate recommends pt surrender his driver's license. Pt voiced understanding and directed his wife "just cancel my appointment for the driving school, I will give up my license."   Skilled verbal and written education provided in response to questions about pt's Head CT to aid with understanding of vocabulary in preparation of upcoming appt with neurology.   PATIENT EDUCATION: Education details: see above Person educated: Patient and Spouse Education method: Explanation Education  comprehension: needs further education   HOME EXERCISE PROGRAM:   Begin recruiting assistance for increased supervision at night   GOALS:  Goals reviewed with patient? Yes  SHORT TERM GOALS: Target date: 10 sessions   With Min A, patient will recall functional verbal information 75% accuracy with use of compensations/strategies (repeats, rephrasing, notetaking, etc).  Baseline: Goal status: INITIAL  2.  With Min A, patient will demonstrate knowledge of appropriate activities to support cognitive and  language function outside of ST with assistance from family.   Baseline:  Goal status: INITIAL  3.  With Min A, given a functional problem a scenario from daily life, patient will demonstrate error awareness and correct errors  with at least 75% accuracy.  Baseline:  Goal status: INITIAL   LONG TERM GOALS: Target date: 01/10/2024  With Rare Min A, patient will use strategies to improve memory for important information with 90% acc. Independently (ie.,daily planner/calendar, lists).   Baseline:  Goal status: INITIAL  2.  With Min A, patient/family will demonstrate understanding of the following concepts: Dementia, impact on cognition, safety awareness, strengths/strategies to promote success, local resources by answering multiple choice questions with 75% accuracy when provided supported conversation in order to increase patient's participation in medical care.  Baseline:  Goal status: INITIAL  3.  Pt will consume least restrictive diet with reduced overt s/s of aspiration or dysphagia to reduce risk of malnutrition, dehydration and pneumonia.  Baseline:  Goal status: INITIAL   ASSESSMENT:  CLINICAL IMPRESSION: Patient is a 78 y.o. right handed male who was seen today for clinical swallow evaluation and cognitive communication treatment d/t diagnosis of progressing dementia.  Pt presents with adequate oropharyngeal abilities when consuming thin liquids and regular textures  (graham crackers).    Suspect pt's intermittent dysphagia is related to overall cognitive decline. As such, assessment provided of cognitive communicaiton abilities. Pt's wife describes gradual memory loss over the last several years with worsening abilities over the last several months "really since April, they took him off of mind altering medicines" in the hospital during his admission at [facility] (04/24 thru 10/02/2023). She has not noticed any improvement in pt ability.    Confusion is especially present in pt's sleeping patterns. She states "he is up and down thorughout the night, gets up at 11:30 pm to perk the coffee" and is not able to be redirected, he doesn't understand that it is nighttime, this confusion also results in moving to spare bedroom and then the couch and back to their bed.   Functionally, pt is now dependent on his wife for medication management, reports that his attempts to balance their checkbook has resulted as "now it is screwedup," pt unable to recall recommendation by PCP to cease driving until completion of assessments "well, I drove over here." While pt's wife recalls this information she reports  being relunctant to remind him and offer driving because "it would result in a fuss, he has always had control of everything."  Formally pt presents with a moderate amnestic cognitive impairment with severe deficits in short term memory, retrieval of information, prospective memory, awareness of impairments/deficits, moderate deficits in problem solving and reasoning, severe executive function deficits.    In light of the above deficits, would recommend continued ST sessions to target education on safety, current cognitive impairments, progressive process associated with current dx of dementia and to promote safe opportunities for pt to engage in cognitive activities. Pt and his wife were agreeable and this Clinical research associate will seek certification for such.   See the above treatment note for  details of today's session.   OBJECTIVE IMPAIRMENTS include attention, memory, awareness, and executive functioning. These impairments are limiting patient from managing medications, managing appointments, managing finances, household responsibilities, and ADLs/IADLs. Factors affecting potential to achieve goals and functional outcome are ability to learn/carryover information, co-morbidities, medical prognosis, previous level of function, severity of impairments, and family/community support. Patient will benefit from skilled SLP services to address above impairments and improve overall function.  REHAB POTENTIAL: Good  PLAN: SLP FREQUENCY: 1-2x/week  SLP DURATION: 12 weeks  PLANNED INTERVENTIONS: Aspiration precaution training, Environmental controls, Cognitive reorganization, Internal/external aids, Functional tasks, SLP instruction and feedback, Compensatory strategies, and Patient/family education   Enora Trillo B. Garlin Junker, M.S., CCC-SLP, Tree surgeon Certified Brain Injury Specialist [facility]  [facility] Rehabilitation Services Office [phone] Ascom [phone] Fax [phone]

## 2023-11-03 ENCOUNTER — Ambulatory Visit: Admitting: Speech Pathology

## 2023-11-03 DIAGNOSIS — J189 Pneumonia, unspecified organism: Secondary | ICD-10-CM | POA: Diagnosis not present

## 2023-11-03 DIAGNOSIS — R41841 Cognitive communication deficit: Secondary | ICD-10-CM | POA: Diagnosis not present

## 2023-11-03 DIAGNOSIS — R1312 Dysphagia, oropharyngeal phase: Secondary | ICD-10-CM | POA: Diagnosis not present

## 2023-11-03 NOTE — Therapy (Signed)
 OUTPATIENT SPEECH LANGUAGE PATHOLOGY  COGNITION TREATMENT   Patient Name: Karl Myers MRN: [id number] DOB:11-21-1945, 78 y.o., male Today's Date: 11/03/2023  PCP: Jacklin Mascot, MD REFERRING PROVIDER: Jacklin Mascot, MD   End of Session - 11/03/23 1006     Visit Number 5    Number of Visits 25    Date for SLP Re-Evaluation 01/10/24    Authorization Type [employer] [facility] Medicare    Progress Note Due on Visit 10    SLP Start Time 1015    SLP Stop Time  1100    SLP Time Calculation (min) 45 min    Activity Tolerance Patient tolerated treatment well             Past Medical History:  Diagnosis Date   Actinic keratosis    Acute otitis media 05/08/2023   Arthritis    COPD (chronic obstructive pulmonary disease) (HCC)    DDD (degenerative disc disease), cervical    DDD (degenerative disc disease), cervical    Dementia (HCC)    Depression    History of COVID-19    Hypertension    Melanoma (HCC) 09/04/2019   R 5th toe lateral tip, Stage IV, 0.81mm Breslow's   Pre-diabetes    Past Surgical History:  Procedure Laterality Date   BACK SURGERY     x 3   CARPAL TUNNEL RELEASE Right 08/19/2017   Procedure: CARPAL TUNNEL RELEASE;  Surgeon: Molli Angelucci, MD;  Location: [facility] ORS;  Service: Orthopedics;  Laterality: Right;   CATARACT EXTRACTION W/PHACO Right 11/17/2016   Procedure: CATARACT EXTRACTION PHACO AND INTRAOCULAR LENS PLACEMENT (IOC);  Surgeon: Clair Crews, MD;  Location: [facility];  Service: Ophthalmology;  Laterality: Right;  US  00:37 AP% 12.6 CDE 4.69 Fluid pack lot # [id number]   CATARACT EXTRACTION W/PHACO Left 12/08/2016   Procedure: CATARACT EXTRACTION PHACO AND INTRAOCULAR LENS PLACEMENT (IOC);  Surgeon: Clair Crews, MD;  Location: [facility];  Service: Ophthalmology;  Laterality: Left;  US  00:33 AP% 13.9 CDE 4.63 Fluid pack lot # [id number]   TOE AMPUTATION Left    ULNAR TUNNEL RELEASE Right 08/19/2017   Procedure: CUBITAL TUNNEL RELEASE;   Surgeon: Molli Angelucci, MD;  Location: [facility];  Service: Orthopedics;  Laterality: Right;   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   Low blood magnesium  10/07/2023   Insomnia 10/07/2023   Tobacco use disorder, continuous 10/07/2023   Tobacco abuse counseling 10/07/2023   Left sided lacunar stroke (HCC) 10/07/2023   Dementia (HCC) 09/14/2023   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   Adrenal nodule (HCC) 08/16/2023   Pneumonia due to infectious organism 06/15/2023   COVID-19 06/09/2023   COPD exacerbation (HCC) 06/08/2023   Depression 06/08/2023   Acute hypoxic respiratory failure (HCC) 06/08/2023   Weakness 06/08/2023   COVID-19 virus infection 06/08/2023   Impacted cerumen of left ear 05/08/2023   Actinic keratosis 05/08/2023   PAD (peripheral artery disease) (HCC) 04/09/2023   Lumbar radiculopathy 03/15/2023   Fear of falling 12/09/2022   Headache 07/27/2022   Macrocytosis 03/26/2022   Low serum vitamin B12 03/26/2022   Elevated MCV 02/03/2022   Memory difficulty 01/05/2022   Prediabetes 01/05/2022   Right knee pain 07/07/2021   History of melanoma 10/13/2019   Atrial fibrillation (HCC) 08/01/2019   Low TSH level 08/01/2019   COPD (chronic obstructive pulmonary disease) (HCC)    Anxiety    AKI (acute kidney injury) (HCC)  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   Family history of prostate cancer in father 06/17/2018   CAD (coronary artery disease) 07/27/2017   Tobacco abuse 06/24/2017   Hypertension 03/12/2017   Hyperlipidemia 03/12/2017   Fatigue 03/12/2017   Sleeping difficulty 03/12/2017   Cervical radiculopathy 03/12/2017    ONSET DATE: date of referral  10/07/2023  REFERRING DIAG: J18.9 (ICD-10-CM) - Pneumonia of left lower lobe due to infectious organism: left sided lacunar infarction - I63.81 Dementia, unspecified dementia severity, unspecified dementia type, unspecified  whether behavioral, psychotic, or mood disturbance or anxiety (F03.90)  THERAPY DIAG:  Cognitive communication deficit  Rationale for Evaluation and Treatment Rehabilitation  SUBJECTIVE:   PERTINENT HISTORY and DIAGNOSTIC FINDINGS:  Pt is a right handed 78 year old male with   12/09/2022 Memory difficulty, Chronic issue.  Likely progressing.  Patient likely has dementia given his slums score (19/30).    06/13/2023 & 08/08/2023 Pneumonia of left lower lobe due to infectious organism  09/30/2023 thru 10/02/2023 Pt admitted to [facility] d/t delirium. Per chart "Pt with history of cognitive impairment with 1 month of worsening confusion and increased restlessness over the past 3 days, as well as "shuffling gait" over the past few months. CT head w/o acute findings, notable for global cerebral atrophy, chronic small vessel ischemia dz, chronic lacunar infarct in L basal ganglia."  Per pt's wife, pt with choking episode following discharge therefore dysphagia evaluation was requested.    PAIN:  Are you having pain? No   FALLS: Has patient fallen in last 6 months?  No  LIVING ENVIRONMENT: Lives with: lives with their spouse Lives in: House/apartment  PLOF:  Level of assistance: Independent with ADLs, Independent with IADLs Employment: Retired   PATIENT GOALS   to reduce choking risks, pt's wife concerned about recent decline in sleeping and cognition related to dx of dementia  SUBJECTIVE STATEMENT: Pt pleasant, both eager, daughter present for today's session Pt accompanied by: significant other; daughter attended session  OBJECTIVE:   TODAY'S TREATMENT:  Skilled ST session targeted pt's cognitive communication deficits. SLP facilitated the session by providing the following interventions:  Pt's daughter attended session today for education. SLP provided overview of cognitive impairment, severity and specific deficits. Following last session, pt declined to purchase a smoke  detector of alarms of exterior doors, in addition he continues to have access to car keys. Continued education provided on safety risks.   Daughter states that she attended an educational class for dementia this week. She also voices concern about caregiver fatigue for pt's wife. They also state that pt decided to investigate VA benefits and they are haivng advanced directive notarized today.    PATIENT EDUCATION: Education details: see above Person educated: Patient and Spouse Education method: Explanation Education comprehension: needs further education   HOME EXERCISE PROGRAM:   Begin recruiting assistance for increased supervision at night   GOALS:  Goals reviewed with patient? Yes  SHORT TERM GOALS: Target date: 10 sessions   With Min A, patient will recall functional verbal information 75% accuracy with use of compensations/strategies (repeats, rephrasing, notetaking, etc).  Baseline: Goal status: INITIAL  2.  With Min A, patient will demonstrate knowledge of appropriate activities to support cognitive and  language function outside of ST with assistance from family.   Baseline:  Goal status: INITIAL  3.  With Min A, given a functional problem a scenario from daily life, patient will demonstrate error awareness and correct errors  with at least 75% accuracy.  Baseline:  Goal status: INITIAL   LONG TERM GOALS: Target date: 01/10/2024  With Rare Min A, patient will use strategies to improve memory for important information with 90% acc. Independently (ie.,daily planner/calendar, lists).   Baseline:  Goal status: INITIAL  2.  With Min A, patient/family will demonstrate understanding of the following concepts: Dementia, impact on cognition, safety awareness, strengths/strategies to promote success, local resources by answering multiple choice questions with 75% accuracy when provided supported conversation in order to increase patient's participation in medical care.   Baseline:  Goal status: INITIAL  3.  Pt will consume least restrictive diet with reduced overt s/s of aspiration or dysphagia to reduce risk of malnutrition, dehydration and pneumonia.  Baseline:  Goal status: INITIAL   ASSESSMENT:  CLINICAL IMPRESSION: Patient is a 78 y.o. right handed male who was seen today for clinical swallow evaluation and cognitive communication treatment d/t diagnosis of progressing dementia.  With pt permission, the following information was sent to Dr Devora Folks for upcoming evaluation on 11/04/2023.   Good morning Dr Mason Sole,  Brunilda Capra "Hewitt Lou" Reg (DOB 19-Feb-1946) has a new patient appt with you tomorrow (they might have the appt with Valeria Gates, they weren't sure).  He was initially referred to me for an atypical episode of choking. But given his cognitive decline and dx of dementia by his PCP, I recommended we start cognitive testing and training.    I hope the following information will be helpful.  Cognitive Abilities: Moderate to severe cognitive impairment with significant deficits in memory, safety awareness. He obtained a score of 62 out of 100 on ACE III. His cognitive abilities have not improved despite [facility] taking him off of all medicines that might have caused polypharmacy. Pt and family have observed a decline over several years but recently worsened over the last 3-4 months. His wife has taken over their finances and his medication management.  Safety Awareness: Moderate to Significantly Impaired - he has stopped driving!  Behavior His wife reports anxiety/agitation during the evenings (around 4-5pm) d/t possible sundowning (?). He is not able to be redirected by anyone during the evenings or throughout the night.  Sleep Disturbance He is up throughout the night, wandering the house or outside. He goes outside to his building and smokes. He also got in the car and driven during the night (hopefully his wife has hidden the keys) with no idea where  he went. He also experiences intermittent hallucinations - for example he woke his wife up one night and requested her help in "giving the man in the den CPR." Last night he got in their closet and rearranged clothes then wasn't able to get out so he stayed in for several hours until he could figure out how to get out. She is not able to redirect him or improve his sleep. Melatonin has not been effective.  Caregiver Support His wife is his primary caregiver. Extensive education has been provided to pt and his wife on progressive cognitive decline and need to recruit family support - especially to increase supervision throughout the night. His son and daughter have attended most recent sessions.  They are hopeful that you might have some ideas/medication to help with pt's sundowning and sleep disturbances (especially).  Kind regards, Aneya Daddona     See the above treatment note for details of today's session.   OBJECTIVE IMPAIRMENTS include attention, memory, awareness, and executive functioning. These impairments are limiting patient from managing medications, managing appointments, managing finances, household responsibilities, and ADLs/IADLs.  Factors affecting potential to achieve goals and functional outcome are ability to learn/carryover information, co-morbidities, medical prognosis, previous level of function, severity of impairments, and family/community support. Patient will benefit from skilled SLP services to address above impairments and improve overall function.  REHAB POTENTIAL: Good  PLAN: SLP FREQUENCY: 1-2x/week  SLP DURATION: 12 weeks  PLANNED INTERVENTIONS: Aspiration precaution training, Environmental controls, Cognitive reorganization, Internal/external aids, Functional tasks, SLP instruction and feedback, Compensatory strategies, and Patient/family education   Jolinda Pinkstaff B. Garlin Junker, M.S., CCC-SLP, Tree surgeon Certified Brain Injury Specialist [facility]   [facility] Rehabilitation Services Office [phone] Ascom [phone] Fax [phone]

## 2023-11-05 ENCOUNTER — Other Ambulatory Visit: Payer: Self-pay

## 2023-11-05 ENCOUNTER — Ambulatory Visit: Admitting: Speech Pathology

## 2023-11-05 DIAGNOSIS — J189 Pneumonia, unspecified organism: Secondary | ICD-10-CM | POA: Diagnosis not present

## 2023-11-05 DIAGNOSIS — R41841 Cognitive communication deficit: Secondary | ICD-10-CM | POA: Diagnosis not present

## 2023-11-05 DIAGNOSIS — R1312 Dysphagia, oropharyngeal phase: Secondary | ICD-10-CM | POA: Diagnosis not present

## 2023-11-05 NOTE — Patient Instructions (Signed)
 Visit Information  Thank you for taking time to visit with me today. Please don't hesitate to contact me if I can be of assistance to you before our next scheduled telephone appointment.  Our next appointment is by telephone on Wednesday June 4th at 3:30pm  Following is a copy of your care plan:   Goals Addressed             This Visit's Progress    [facility]BCI Transitions of Care (TOC) Care Plan       Problems: (reviewed 11/05/23) Recent Hospitalization for treatment of Dementia Limited social support: The spouse is the primary support and this is a new diagnosis for the patient   Goal: (reviewed 11/05/23) Over the next 30 days, the patient will not experience hospital readmission  Interventions: (reviewed 11/05/23)  Dementia: Evaluation of current treatment plan related to misuse of: Alzheimer's dementia Emotional Support Provided to patient/caregiver, Sleep assessment completed, Sleep hygiene recommendations and education provided, Consideration of in-home help encouraged , Discussed Health Care Power of [place] , and Advised to contact provider for new or worsening symptoms Driving test scheduled for 11/08/23 to assess competency ST referral for choking episode - Rule Out Aspiration (completed) Magnesium  oxide 200mg  daily Referral to Neurology - 11/08/23 Sleep study due to insomnia Referral to Pulmonology - 10/26/23 (completed) Speech Therapy twice weekly for cognitive training and education  Patient Self Care Activities: (reviewed 11/05/23) Attend all scheduled provider appointments Call pharmacy for medication refills 3-7 days in advance of running out of medications Call provider office for new concerns or questions  Notify RN Care Manager of [facility] call rescheduling needs Participate in Transition of Care Program/Attend TOC scheduled calls Take medications as prescribed   Fall Precautions - The patient is on Apixaban . Educated on increased risk of bleeding  Plan:  Telephone follow  up appointment with care management team member scheduled for:  Wednesday June 4 at 3:30pm        Patient verbalizes understanding of instructions and care plan provided today and agrees to view in MyChart. Active MyChart status and patient understanding of how to access instructions and care plan via MyChart confirmed with patient.     The patient has been provided with contact information for the care management team and has been advised to call with any health related questions or concerns.   Please call the care guide team at [phone] if you need to cancel or reschedule your appointment.   Please call the Suicide and Crisis Lifeline: 988 call the [place]  National Suicide Prevention Lifeline: 214-345-9795 or TTY: 574-089-4467 TTY 220-365-5476) to talk to a trained counselor if you are experiencing a Mental Health or Behavioral Health Crisis or need someone to talk to.  Gareld June, BSN, RN [place]  VBCI - [employer] [facility] RN Care Manager [phone]

## 2023-11-05 NOTE — Therapy (Signed)
 OUTPATIENT SPEECH LANGUAGE PATHOLOGY  COGNITION TREATMENT   Patient Name: Karl Myers MRN: [id number] DOB:1945-10-11, 78 y.o., male Today's Date: 11/05/2023  PCP: Jacklin Mascot, MD REFERRING PROVIDER: Jacklin Mascot, MD   End of Session - 11/05/23 1027     Visit Number 6    Number of Visits 25    Date for SLP Re-Evaluation 01/10/24    Authorization Type [employer] [facility] Medicare    Progress Note Due on Visit 10    SLP Start Time 0930    SLP Stop Time  1015    SLP Time Calculation (min) 45 min    Activity Tolerance Patient tolerated treatment well             Past Medical History:  Diagnosis Date   Actinic keratosis    Acute otitis media 05/08/2023   Arthritis    COPD (chronic obstructive pulmonary disease) (HCC)    DDD (degenerative disc disease), cervical    DDD (degenerative disc disease), cervical    Dementia (HCC)    Depression    History of COVID-19    Hypertension    Melanoma (HCC) 09/04/2019   R 5th toe lateral tip, Stage IV, 0.86mm Breslow's   Pre-diabetes    Past Surgical History:  Procedure Laterality Date   BACK SURGERY     x 3   CARPAL TUNNEL RELEASE Right 08/19/2017   Procedure: CARPAL TUNNEL RELEASE;  Surgeon: Molli Angelucci, MD;  Location: [facility] ORS;  Service: Orthopedics;  Laterality: Right;   CATARACT EXTRACTION W/PHACO Right 11/17/2016   Procedure: CATARACT EXTRACTION PHACO AND INTRAOCULAR LENS PLACEMENT (IOC);  Surgeon: Clair Crews, MD;  Location: [facility] ORS;  Service: Ophthalmology;  Laterality: Right;  US  00:37 AP% 12.6 CDE 4.69 Fluid pack lot # [id number]   CATARACT EXTRACTION W/PHACO Left 12/08/2016   Procedure: CATARACT EXTRACTION PHACO AND INTRAOCULAR LENS PLACEMENT (IOC);  Surgeon: Clair Crews, MD;  Location: [facility] ORS;  Service: Ophthalmology;  Laterality: Left;  US  00:33 AP% 13.9 CDE 4.63 Fluid pack lot # [id number]   TOE AMPUTATION Left    ULNAR TUNNEL RELEASE Right 08/19/2017   Procedure: CUBITAL TUNNEL RELEASE;   Surgeon: Molli Angelucci, MD;  Location: [facility] ORS;  Service: Orthopedics;  Laterality: Right;   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   Low blood magnesium  10/07/2023   Insomnia 10/07/2023   Tobacco use disorder, continuous 10/07/2023   Tobacco abuse counseling 10/07/2023   Left sided lacunar stroke (HCC) 10/07/2023   Dementia (HCC) 09/14/2023   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   Adrenal nodule (HCC) 08/16/2023   Pneumonia due to infectious organism 06/15/2023   COVID-19 06/09/2023   COPD exacerbation (HCC) 06/08/2023   Depression 06/08/2023   Acute hypoxic respiratory failure (HCC) 06/08/2023   Weakness 06/08/2023   COVID-19 virus infection 06/08/2023   Impacted cerumen of left ear 05/08/2023   Actinic keratosis 05/08/2023   PAD (peripheral artery disease) (HCC) 04/09/2023   Lumbar radiculopathy 03/15/2023   Fear of falling 12/09/2022   Headache 07/27/2022   Macrocytosis 03/26/2022   Low serum vitamin B12 03/26/2022   Elevated MCV 02/03/2022   Memory difficulty 01/05/2022   Prediabetes 01/05/2022   Right knee pain 07/07/2021   History of melanoma 10/13/2019   Atrial fibrillation (HCC) 08/01/2019   Low TSH level 08/01/2019   COPD (chronic obstructive pulmonary disease) (HCC)    Anxiety    AKI (acute kidney injury) (HCC)  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   Family history of prostate cancer in father 06/17/2018   CAD (coronary artery disease) 07/27/2017   Tobacco abuse 06/24/2017   Hypertension 03/12/2017   Hyperlipidemia 03/12/2017   Fatigue 03/12/2017   Sleeping difficulty 03/12/2017   Cervical radiculopathy 03/12/2017    ONSET DATE: date of referral  10/07/2023  REFERRING DIAG: J18.9 (ICD-10-CM) - Pneumonia of left lower lobe due to infectious organism: left sided lacunar infarction - I63.81 Dementia, unspecified dementia severity, unspecified dementia type, unspecified  whether behavioral, psychotic, or mood disturbance or anxiety (F03.90)  THERAPY DIAG:  Cognitive communication deficit  Rationale for Evaluation and Treatment Rehabilitation  SUBJECTIVE:   PERTINENT HISTORY and DIAGNOSTIC FINDINGS:  Pt is a right handed 78 year old male with   12/09/2022 Memory difficulty, Chronic issue.  Likely progressing.  Patient likely has dementia given his slums score (19/30).    06/13/2023 & 08/08/2023 Pneumonia of left lower lobe due to infectious organism  09/30/2023 thru 10/02/2023 Pt admitted to [facility] d/t delirium. Per chart "Pt with history of cognitive impairment with 1 month of worsening confusion and increased restlessness over the past 3 days, as well as "shuffling gait" over the past few months. CT head w/o acute findings, notable for global cerebral atrophy, chronic small vessel ischemia dz, chronic lacunar infarct in L basal ganglia."  Per pt's wife, pt with choking episode following discharge therefore dysphagia evaluation was requested.    PAIN:  Are you having pain? No   FALLS: Has patient fallen in last 6 months?  No  LIVING ENVIRONMENT: Lives with: lives with their spouse Lives in: House/apartment  PLOF:  Level of assistance: Independent with ADLs, Independent with IADLs Employment: Retired   PATIENT GOALS   to reduce choking risks, pt's wife concerned about recent decline in sleeping and cognition related to dx of dementia  SUBJECTIVE STATEMENT: Pt and his wife state that appt with neurologist was cancelled yesterday but rescheduled to Monday June 1st Pt accompanied by: significant other OBJECTIVE:   TODAY'S TREATMENT:  Skilled ST session targeted pt's cognitive communication deficits. SLP facilitated the session by providing the following interventions:  Pt voiced discouragement as "it only appears to be getting worse." His wife reports that pt didn't recognize facility this morning when they arrived, had been up most night  thinking that "he was [place] a [place] and that his brother had just died." Continue to recommend family support to prevent caregiver fatigue. Pt's wife reports that her daughter will likely stay with them tonight. Pt and wife also report worsening restlessness during the evening hours with their "daughter saw how bad it was yesterday." Pt states "my mind won't settle down."  Both brought in their completed advanced directives with chaplain aiding in providing directions for this writer to scan them to [email or web address]  Education also provided on need to include their children in financial information matters in the event that pt's wife becomes unable.    PATIENT EDUCATION: Education details: see above Person educated: Patient and Spouse Education method: Explanation Education comprehension: needs further education   HOME EXERCISE PROGRAM:   Begin recruiting assistance for increased supervision at night   GOALS:  Goals reviewed with patient? Yes  SHORT TERM GOALS: Target date: 10 sessions   With Min A, patient will recall functional verbal information 75% accuracy with use of compensations/strategies (repeats, rephrasing, notetaking, etc).  Baseline: Goal status: INITIAL  2.  With Min A, patient will demonstrate knowledge of appropriate  activities to support cognitive and  language function outside of ST with assistance from family.   Baseline:  Goal status: INITIAL  3.  With Min A, given a functional problem a scenario from daily life, patient will demonstrate error awareness and correct errors  with at least 75% accuracy.  Baseline:  Goal status: INITIAL   LONG TERM GOALS: Target date: 01/10/2024  With Rare Min A, patient will use strategies to improve memory for important information with 90% acc. Independently (ie.,daily planner/calendar, lists).   Baseline:  Goal status: INITIAL  2.  With Min A, patient/family will demonstrate understanding of the  following concepts: Dementia, impact on cognition, safety awareness, strengths/strategies to promote success, local resources by answering multiple choice questions with 75% accuracy when provided supported conversation in order to increase patient's participation in medical care.  Baseline:  Goal status: INITIAL  3.  Pt will consume least restrictive diet with reduced overt s/s of aspiration or dysphagia to reduce risk of malnutrition, dehydration and pneumonia.  Baseline:  Goal status: INITIAL   ASSESSMENT:  CLINICAL IMPRESSION: Patient is a 78 y.o. right handed male who was seen today for a cognitive communication treatment d/t diagnosis of progressing dementia.  Pt and his wife present today voicing overall discouragement with increasing severity of symptomsListening ear and encouragement provided as well as recommend family spend the night throughout the weekend to offer support.   See the above treatment note for details of today's session.   OBJECTIVE IMPAIRMENTS include attention, memory, awareness, and executive functioning. These impairments are limiting patient from managing medications, managing appointments, managing finances, household responsibilities, and ADLs/IADLs. Factors affecting potential to achieve goals and functional outcome are ability to learn/carryover information, co-morbidities, medical prognosis, previous level of function, severity of impairments, and family/community support. Patient will benefit from skilled SLP services to address above impairments and improve overall function.  REHAB POTENTIAL: Good  PLAN: SLP FREQUENCY: 1-2x/week  SLP DURATION: 12 weeks  PLANNED INTERVENTIONS: Aspiration precaution training, Environmental controls, Cognitive reorganization, Internal/external aids, Functional tasks, SLP instruction and feedback, Compensatory strategies, and Patient/family education   Jernard Reiber B. Garlin Junker, M.S., CCC-SLP, Research officer, trade union Certified Brain Injury Specialist [facility]  [facility] Office [phone] Ascom [phone] Fax [phone]

## 2023-11-05 NOTE — Transitions of Care (Post Inpatient/ED Visit) (Signed)
 Transition of Care Week 5  Visit Note  11/05/2023  Name: Karl Myers MRN: [id number]          DOB: Mar 20, 1946  Situation: Patient enrolled in [facility] 30-day program. Visit completed with Ralene Burger, DPR by telephone.   Background:     Past Medical History:  Diagnosis Date   Actinic keratosis    Acute otitis media 05/08/2023   Arthritis    COPD (chronic obstructive pulmonary disease) (HCC)    DDD (degenerative disc disease), cervical    DDD (degenerative disc disease), cervical    Dementia (HCC)    Depression    History of COVID-19    Hypertension    Melanoma (HCC) 09/04/2019   R 5th toe lateral tip, Stage IV, 0.42mm Breslow's   Pre-diabetes     Assessment: Patient Reported Symptoms: Cognitive Cognitive Status: Alert and oriented to person, place, and time, Able to follow simple commands, Requires Assistance Decision Making, Struggling with memory recall   Health Maintenance Behaviors: Annual physical exam  Neurological Neurological Review of Symptoms: No symptoms reported Neurological Conditions: Dementia Neurological Management Strategies: Coping strategies, Routine screening Neurological Self-Management Outcome: 3 (uncertain) Neurological Comment: Neurology appointment on June 2nd  HEENT HEENT Symptoms Reported: No symptoms reported      Cardiovascular Cardiovascular Symptoms Reported: No symptoms reported Does patient have uncontrolled Hypertension?: No Cardiovascular Conditions: Hypertension, High blood cholesterol Cardiovascular Management Strategies: Medication therapy Weight: 155 lb (70.3 kg) Cardiovascular Self-Management Outcome: 4 (good) Cardiovascular Comment: The patient does not check BP at home  Respiratory Respiratory Symptoms Reported: No symptoms reported Additional Respiratory Details: Smoker Respiratory Conditions: COPD Respiratory Self-Management Outcome: 2 (bad) Respiratory Comment: Smoker, Lungs are not good  Endocrine Patient reports  the following symptoms related to hypoglycemia or hyperglycemia : No symptoms reported Is patient checking blood sugars at home?: Yes Endocrine Conditions: Diabetes  Gastrointestinal Gastrointestinal Symptoms Reported: No symptoms reported   Nutrition Risk Screen (CP): No indicators present  Genitourinary Genitourinary Symptoms Reported: Incontinence Additional Genitourinary Details: Occasional Genitourinary Conditions: Incontinence Genitourinary Management Strategies: Coping strategies Genitourinary Self-Management Outcome: 4 (good)  Integumentary Integumentary Symptoms Reported: No symptoms reported    Musculoskeletal Musculoskelatal Symptoms Reviewed: No symptoms reported   Falls in the past year?: Yes Number of falls in past year: 2 or more Was there an injury with Fall?: No Fall Risk Category Calculator: 2 Patient Fall Risk Level: Moderate Fall Risk Patient at Risk for Falls Due to: History of fall(s) Fall risk Follow up: Falls evaluation completed  Psychosocial Psychosocial Symptoms Reported: No symptoms reported         There were no vitals filed for this visit.  Medications Reviewed Today     Reviewed by Claudene Crystal, RN (Case Manager) on 11/05/23 at 1337  Med List Status: <None>   Medication Order Taking? Sig Documenting Provider Last Dose Status Informant  albuterol  (VENTOLIN  HFA) 108 (90 Base) MCG/ACT inhaler [id number]  Inhale 2 puffs into the lungs every 6 (six) hours as needed for wheezing or shortness of breath. Marc Senior, MD  Active   apixaban  (ELIQUIS ) 5 MG TABS tablet [id number] No TAKE ONE TABLET BY MOUTH TWICE DAILY Gollan, Timothy J, MD Taking Active Self, Spouse/Significant Other, Pharmacy Records  atenolol  (TENORMIN ) 25 MG tablet [id number] No Take 1 tablet (25 mg total) by mouth at bedtime. Kent Pear, MD Taking Active Self, Spouse/Significant Other, Pharmacy Records  atorvastatin  (LIPITOR) 80 MG tablet [id number] No Take 1 tablet (80 mg  total) by mouth daily. Tullo,  Ray Caffey, MD Taking Active   Cholecalciferol (VITAMIN D3) 25 MCG (1000 UT) CAPS [id number] No Take by mouth. [provider] Taking Active   cyanocobalamin  (VITAMIN B12) 1000 MCG tablet [id number] No Take 1 tablet (1,000 mcg total) by mouth daily. Timmy Forbes, MD Taking Active Self, Spouse/Significant Other, Pharmacy Records  Fluticasone -Umeclidin-Vilant (TRELEGY ELLIPTA ) 100-62.5-25 MCG/ACT AEPB [id number] No Inhale 1 puff into the lungs daily. Faith Homes, MD Taking Active   Fluticasone -Umeclidin-Vilant (TRELEGY ELLIPTA ) 100-62.5-25 MCG/ACT AEPB [id number] No Inhale 1 Inhalation into the lungs daily in the afternoon. Marc Senior, MD Taking Active   Magnesium  200 MG TABS [id number] No Take 1 tablet by mouth daily in the afternoon. [provider] Taking Active   magnesium  oxide (MAG-OX) tablet 200 mg [id number]   Bair, Kalpana, MD  Active   meloxicam (MOBIC) 15 MG tablet [id number] No Take 15 mg by mouth daily. [provider] Taking Active   nitroGLYCERIN  (NITROSTAT ) 0.4 MG SL tablet [id number] No Place 1 tablet (0.4 mg total) under the tongue every 5 (five) minutes as needed for chest pain. For up to 3 doses per episode. Kent Pear, MD Taking Active Self, Spouse/Significant Other, Pharmacy Records  valsartan  (DIOVAN ) 320 MG tablet [id number] No Take 320 mg by mouth daily. [provider] Taking Active             Recommendation:   Continue Current Plan of Care  Follow Up Plan:   Telephone follow-up in 1 week  Gareld June, BSN, RN [place]  VBCI - [facility] RN Care Manager [phone]

## 2023-11-08 ENCOUNTER — Telehealth: Payer: Self-pay

## 2023-11-08 ENCOUNTER — Encounter: Admitting: Speech Pathology

## 2023-11-08 DIAGNOSIS — F5104 Psychophysiologic insomnia: Secondary | ICD-10-CM | POA: Diagnosis not present

## 2023-11-08 DIAGNOSIS — G319 Degenerative disease of nervous system, unspecified: Secondary | ICD-10-CM | POA: Diagnosis not present

## 2023-11-08 DIAGNOSIS — Z1331 Encounter for screening for depression: Secondary | ICD-10-CM | POA: Diagnosis not present

## 2023-11-08 DIAGNOSIS — R413 Other amnesia: Secondary | ICD-10-CM | POA: Diagnosis not present

## 2023-11-08 DIAGNOSIS — F0150 Vascular dementia without behavioral disturbance: Secondary | ICD-10-CM | POA: Diagnosis not present

## 2023-11-08 DIAGNOSIS — F0280 Dementia in other diseases classified elsewhere without behavioral disturbance: Secondary | ICD-10-CM | POA: Diagnosis not present

## 2023-11-08 DIAGNOSIS — G309 Alzheimer's disease, unspecified: Secondary | ICD-10-CM | POA: Diagnosis not present

## 2023-11-08 DIAGNOSIS — M311 Thrombotic microangiopathy, unspecified: Secondary | ICD-10-CM | POA: Diagnosis not present

## 2023-11-08 NOTE — Telephone Encounter (Signed)
 Called patient to follow up on status with appointments regarding DMV paperwork for the status of patient's driving. Left message for patient to give our office a call back.

## 2023-11-09 ENCOUNTER — Telehealth: Payer: Self-pay

## 2023-11-09 ENCOUNTER — Ambulatory Visit: Admitting: Speech Pathology

## 2023-11-09 DIAGNOSIS — R41841 Cognitive communication deficit: Secondary | ICD-10-CM | POA: Insufficient documentation

## 2023-11-09 NOTE — Telephone Encounter (Signed)
 Copied from CRM [id number]. Topic: MyChart - Other >> Nov 09, 2023  8:01 AM Emmet Harm C wrote: Reason for CRM: Patient Wife called to return Destiny call from yesterday about DMV paperwork and she said he decided to just give up his license

## 2023-11-09 NOTE — Therapy (Signed)
 OUTPATIENT SPEECH LANGUAGE PATHOLOGY  COGNITION TREATMENT DISCHARGE SUMMARY   Patient Name: Karl Myers MRN: [id number] DOB:[birth year], 78 y.o., male Today's Date: 11/09/2023  PCP: Jacklin Mascot, MD REFERRING PROVIDER: Jacklin Mascot, MD   End of Session - 11/09/23 1321     Visit Number 7    Number of Visits 25    Date for SLP Re-Evaluation 01/10/24    Authorization Type [employer] [facility] Medicare    Progress Note Due on Visit 10    SLP Start Time 1317    SLP Stop Time  1340    SLP Time Calculation (min) 23 min    Activity Tolerance Patient tolerated treatment well             Past Medical History:  Diagnosis Date   Actinic keratosis    Acute otitis media 05/08/2023   Arthritis    COPD (chronic obstructive pulmonary disease) (HCC)    DDD (degenerative disc disease), cervical    DDD (degenerative disc disease), cervical    Dementia (HCC)    Depression    History of COVID-19    Hypertension    Melanoma (HCC) 09/04/2019   R 5th toe lateral tip, Stage IV, 0.52mm Breslow's   Pre-diabetes    Past Surgical History:  Procedure Laterality Date   BACK SURGERY     x 3   CARPAL TUNNEL RELEASE Right 08/19/2017   Procedure: CARPAL TUNNEL RELEASE;  Surgeon: Molli Angelucci, MD;  Location: [facility] ORS;  Service: Orthopedics;  Laterality: Right;   CATARACT EXTRACTION W/PHACO Right 11/17/2016   Procedure: CATARACT EXTRACTION PHACO AND INTRAOCULAR LENS PLACEMENT (IOC);  Surgeon: Clair Crews, MD;  Location: [facility] OR[facility];  Service: Ophthalmology;  Laterality: Right;  US  00:37 AP% 12.6 CDE 4.69 Fluid pack lot # [id number]   CATARACT EXTRACTION W/PHACO Left 12/08/2016   Procedure: CATARACT EXTRACTION PHACO AND INTRAOCULAR LENS PLACEMENT (IOC);  Surgeon: Clair Crews, MD;  Location: [facility] OR[facility];  Service: Ophthalmology;  Laterality: Left;  US  00:33 AP% 13.9 CDE 4.63 Fluid pack lot # [id number]   TOE AMPUTATION Left    ULNAR TUNNEL RELEASE Right 08/19/2017   Procedure: CUBITAL  TUNNEL RELEASE;  Surgeon: Molli Angelucci, MD;  Location: [facility] OR[facility];  Service: Orthopedics;  Laterality: Right;   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   Low blood magnesium  10/07/2023   Insomnia 10/07/2023   Tobacco use disorder, continuous 10/07/2023   Tobacco abuse counseling 10/07/2023   Left sided lacunar stroke (HCC) 10/07/2023   Dementia (HCC) 09/14/2023   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   Adrenal nodule (HCC) 08/16/2023   Pneumonia due to infectious organism 06/15/2023   COVID-19 06/09/2023   COPD exacerbation (HCC) 06/08/2023   Depression 06/08/2023   Acute hypoxic respiratory failure (HCC) 06/08/2023   Weakness 06/08/2023   COVID-19 virus infection 06/08/2023   Impacted cerumen of left ear 05/08/2023   Actinic keratosis 05/08/2023   PAD (peripheral artery disease) (HCC) 04/09/2023   Lumbar radiculopathy 03/15/2023   Fear of falling 12/09/2022   Headache 07/27/2022   Macrocytosis 03/26/2022   Low serum vitamin B12 03/26/2022   Elevated MCV 02/03/2022   Memory difficulty 01/05/2022   Prediabetes 01/05/2022   Right knee pain 07/07/2021   History of melanoma 10/13/2019   Atrial fibrillation (HCC) 08/01/2019   Low TSH level 08/01/2019   COPD (chronic obstructive pulmonary disease) (HCC)    Anxiety    AKI (acute kidney injury) (  HCC)    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   Family history of prostate cancer in father 06/17/2018   CAD (coronary artery disease) 07/27/2017   Tobacco abuse 06/24/2017   Hypertension 03/12/2017   Hyperlipidemia 03/12/2017   Fatigue 03/12/2017   Sleeping difficulty 03/12/2017   Cervical radiculopathy 03/12/2017    ONSET DATE: date of referral  10/07/2023  REFERRING DIAG: J18.9 (ICD-10-CM) - Pneumonia of left lower lobe due to infectious organism: left sided lacunar infarction - I63.81 Dementia, unspecified dementia severity, unspecified dementia  type, unspecified whether behavioral, psychotic, or mood disturbance or anxiety (F03.90)  THERAPY DIAG:  Cognitive communication deficit  Rationale for Evaluation and Treatment Rehabilitation  SUBJECTIVE:   PERTINENT HISTORY and DIAGNOSTIC FINDINGS:  Pt is a right handed 78 year old male with   12/09/2022 Memory difficulty, Chronic issue.  Likely progressing.  Patient likely has dementia given his slums score (19/30).    06/13/2023 & 08/08/2023 Pneumonia of left lower lobe due to infectious organism  09/30/2023 thru 10/02/2023 Pt admitted to [facility] d/t delirium. Per chart "Pt with history of cognitive impairment with 1 month of worsening confusion and increased restlessness over the past 3 days, as well as "shuffling gait" over the past few months. CT head w/o acute findings, notable for global cerebral atrophy, chronic small vessel ischemia dz, chronic lacunar infarct in L basal ganglia."  Per pt's wife, pt with choking episode following discharge therefore dysphagia evaluation was requested.    PAIN:  Are you having pain? No   FALLS: Has patient fallen in last 6 months?  No  LIVING ENVIRONMENT: Lives with: lives with their spouse Lives in: House/apartment  PLOF:  Level of assistance: Independent with ADLs, Independent with IADLs Employment: Retired   PATIENT GOALS   to reduce choking risks, pt's wife concerned about recent decline in sleeping and cognition related to dx of dementia  SUBJECTIVE STATEMENT: Per wife's report, pt had difficulty recognizing facility and felt it "was too much effort to walk all the way in" Pt accompanied by: significant other OBJECTIVE:   TODAY'S TREATMENT:  Skilled ST session targeted pt's cognitive communication deficits. SLP facilitated the session by providing the following interventions:  Pt and his wife report good initial consultation with neurologist and improved sleep last night. Pt and his wife voice "encouragement' with improved  rest for pt as well as for responsibility of care giving.     PATIENT EDUCATION: Education details: see above Person educated: Patient and Spouse Education method: Explanation Education comprehension: needs further education   HOME EXERCISE PROGRAM:   Begin recruiting assistance for increased supervision at night   GOALS:  Goals reviewed with patient? Yes  SHORT TERM GOALS: Target date: 10 sessions  Updated: 11/09/2023  With Min A, patient will recall functional verbal information 75% accuracy with use of compensations/strategies (repeats, rephrasing, notetaking, etc).  Baseline: Goal status: INITIAL: pt continues to rely on his wife for information which is appropriate at this time given progressive memory loss  2.  With Min A, patient will demonstrate knowledge of appropriate activities to support cognitive and  language function outside of ST with assistance from family.   Baseline:  Goal status: INITIAL: MET  3.  With Min A, given a functional problem a scenario from daily life, patient will demonstrate error awareness and correct errors  with at least 75% accuracy.  Baseline:  Goal status: INITIAL: no progress made d/t neurodegenerative progress memory loss   LONG TERM GOALS: Target date:  01/10/2024  Updated 11/09/2023  With Rare Min A, patient will use strategies to improve memory for important information with 90% acc. Independently (ie.,daily planner/calendar, lists).   Baseline:  Goal status: INITIAL: MET  2.  With Min A, patient/family will demonstrate understanding of the following concepts: Dementia, impact on cognition, safety awareness, strengths/strategies to promote success, local resources by answering multiple choice questions with 75% accuracy when provided supported conversation in order to increase patient's participation in medical care.  Baseline:  Goal status: INITIAL: MET  3.  Pt will consume least restrictive diet with reduced overt s/s of  aspiration or dysphagia to reduce risk of malnutrition, dehydration and pneumonia.  Baseline:  Goal status: INITIAL: MEt   ASSESSMENT:  CLINICAL IMPRESSION: Patient is a 78 y.o. right handed male who was seen today for a cognitive communication treatment d/t diagnosis of progressing dementia.  Pt and his wife improvement in sleep last night with pt not getting up throughout the night until around 4am this morning. Pt's wife also reports subjecitve decreased restlessness throughout the day today. This wirter facilitated [facility] for [facility] for [facility]'s upcoming appt this Friday. At this time, pt and wife don't have any furhter questions and all education has been completed at this time. Skilled ST services are no longer indicated but this writer is available should pt or his wife need further assistance.   See the above treatment note for details of today's session.   OBJECTIVE IMPAIRMENTS include attention, memory, awareness, and executive functioning. These impairments are limiting patient from managing medications, managing appointments, managing finances, household responsibilities, and ADLs/IADLs. Factors affecting potential to achieve goals and functional outcome are ability to learn/carryover information, co-morbidities, medical prognosis, previous level of function, severity of impairments, and family/community support. Patient will benefit from skilled SLP services to address above impairments and improve overall function.  REHAB POTENTIAL: Good  PLAN: SLP FREQUENCY: 1-2x/week  SLP DURATION: 12 weeks  PLANNED INTERVENTIONS: Aspiration precaution training, Environmental controls, Cognitive reorganization, Internal/external aids, Functional tasks, SLP instruction and feedback, Compensatory strategies, and Patient/family education   Shamirah Ivan B. Garlin Junker, M.S., CCC-SLP, Tree surgeon Certified Brain Injury Specialist [facility]  [facility] Rehabilitation Services Office [phone] [place] [phone] Fax [phone]

## 2023-11-10 ENCOUNTER — Other Ambulatory Visit: Payer: Self-pay

## 2023-11-10 ENCOUNTER — Encounter: Admitting: Speech Pathology

## 2023-11-10 VITALS — Wt 155.0 lb

## 2023-11-10 DIAGNOSIS — F01A0 Vascular dementia, mild, without behavioral disturbance, psychotic disturbance, mood disturbance, and anxiety: Secondary | ICD-10-CM

## 2023-11-10 NOTE — Transitions of Care (Post Inpatient/ED Visit) (Signed)
 Transition of Care week 6  Visit Note  11/10/2023  Name: Karl Myers MRN: [id number]          DOB: 1946-03-19  Situation: Patient enrolled in [facility] 30-day program. Visit completed with King Penning by telephone.   Background:     Past Medical History:  Diagnosis Date   Actinic keratosis    Acute otitis media 05/08/2023   Arthritis    COPD (chronic obstructive pulmonary disease) (HCC)    DDD (degenerative disc disease), cervical    DDD (degenerative disc disease), cervical    Dementia (HCC)    Depression    History of COVID-19    Hypertension    Melanoma (HCC) 09/04/2019   R 5th toe lateral tip, Stage IV, 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   Pre-diabetes     Assessment: Patient Reported Symptoms: Cognitive Cognitive Status: Alert and oriented to person, place, and time, Struggling with memory recall      Neurological Neurological Review of Symptoms: No symptoms reported Neurological Conditions: Dementia Neurological Management Strategies: Coping strategies Neurological Self-Management Outcome: 3 (uncertain) Neurological Comment: The Neurolgist started the patient on some medication to help with sleep. [facility] to [facility] for some additional help at home.  HEENT HEENT Symptoms Reported: No symptoms reported      Cardiovascular Cardiovascular Symptoms Reported: No symptoms reported Does patient have uncontrolled Hypertension?: No Cardiovascular Conditions: Hypertension, High blood cholesterol Cardiovascular Management Strategies: Medication therapy Weight: 155 lb (70.3 kg) Cardiovascular Self-Management Outcome: 4 (good) Cardiovascular Comment: The patient is not checking BP at home  Respiratory Respiratory Symptoms Reported: No symptoms reported Additional Respiratory Details: Smokes Respiratory Conditions: COPD, Sleep disordered breathing Respiratory Self-Management Outcome: 2 (bad) Respiratory Comment: The patient is not going to qit smoking.   Endocrine Patient reports the following symptoms related to hypoglycemia or hyperglycemia : No symptoms reported Is patient diabetic?: Yes Is patient checking blood sugars at home?: Yes Endocrine Conditions: Diabetes Endocrine Self-Management Outcome: 4 (good) Endocrine Comment: The patient is not on medication therapy  Gastrointestinal Gastrointestinal Symptoms Reported: No symptoms reported   Nutrition Risk Screen (CP): No indicators present  Genitourinary Genitourinary Symptoms Reported: Incontinence Additional Genitourinary Details: Occasional. The patient's spouse states it is improving Genitourinary Conditions: Incontinence Genitourinary Management Strategies: Coping strategies Genitourinary Self-Management Outcome: 4 (good) Genitourinary Comment: The patient is improving in this area  Integumentary Integumentary Symptoms Reported: No symptoms reported    Musculoskeletal Musculoskelatal Symptoms Reviewed: No symptoms reported Musculoskeletal Management Strategies: Routine screening Falls in the past year?: Yes Number of falls in past year: 2 or more Was there an injury with Fall?: No Fall Risk Category Calculator: 2 Patient Fall Risk Level: Moderate Fall Risk Patient at Risk for Falls Due to: History of fall(s) Fall risk Follow up: Falls evaluation completed  Psychosocial Psychosocial Symptoms Reported: No symptoms reported         There were no vitals filed for this visit.  Medications Reviewed Today     Reviewed by Claudene Crystal, RN (Case Manager) on 11/10/23 at 1545  Med List Status: <None>   Medication Order Taking? Sig Documenting Provider Last Dose Status Informant  albuterol  (VENTOLIN  HFA) 108 (90 Base) MCG/ACT inhaler [id number]  Inhale 2 puffs into the lungs every 6 (six) hours as needed for wheezing or shortness of breath. Marc Senior, MD  Active   apixaban  (ELIQUIS ) 5 MG TABS tablet [id number]  TAKE ONE TABLET BY MOUTH TWICE DAILY Gollan, Timothy J,  MD  Active Self, Spouse/Significant  Other, Pharmacy Records  atenolol  (TENORMIN ) 25 MG tablet [id number]  Take 1 tablet (25 mg total) by mouth at bedtime. Kent Pear, MD  Active Self, Spouse/Significant Other, Pharmacy Records  atorvastatin  (LIPITOR) 80 MG tablet [id number]  Take 1 tablet (80 mg total) by mouth daily. Thersia Flax, MD  Active   Cholecalciferol (VITAMIN D3) 25 MCG (1000 UT) CAPS [id number]  Take by mouth. [provider]  Active   cyanocobalamin  (VITAMIN B12) 1000 MCG tablet [id number]  Take 1 tablet (1,000 mcg total) by mouth daily. Timmy Forbes, MD  Active Self, Spouse/Significant Other, Pharmacy Records  Fluticasone -Umeclidin-Vilant (TRELEGY ELLIPTA ) 100-62.5-25 MCG/ACT AEPB [id number]  Inhale 1 puff into the lungs daily. Faith Homes, MD  Active   Fluticasone -Umeclidin-Vilant (TRELEGY ELLIPTA ) 100-62.5-25 MCG/ACT AEPB [id number]  Inhale 1 Inhalation into the lungs daily in the afternoon. Marc Senior, MD  Active   Magnesium  200 MG TABS [id number]  Take 1 tablet by mouth daily in the afternoon. [provider]  Active   magnesium  oxide (MAG-OX) tablet 200 mg [id number]   Bair, Kalpana, MD  Active   meloxicam (MOBIC) 15 MG tablet [id number]  Take 15 mg by mouth daily. [provider]  Active   mirtazapine (REMERON) 7.5 MG tablet [id number] Yes Take 7.5 mg by mouth at bedtime. [provider]  Active   nitroGLYCERIN  (NITROSTAT ) 0.4 MG SL tablet [id number]  Place 1 tablet (0.4 mg total) under the tongue every 5 (five) minutes as needed for chest pain. For up to 3 doses per episode. Kent Pear, MD  Active Self, Spouse/Significant Other, Pharmacy Records  valsartan  (DIOVAN ) 320 MG tablet [id number]  Take 320 mg by mouth daily. [provider]  Active             Follow Up Plan:   Closing From:  Transitions of Care Program Referral made to longitudinal CCM program  Gareld June, BSN, RN   VBCI - [facility]  Health RN Care Manager [phone]

## 2023-11-10 NOTE — Patient Instructions (Signed)
 Visit Information  Thank you for taking time to visit with me today. Please don't hesitate to contact me if I can be of assistance to you before our next scheduled telephone appointment.  Following is a copy of your care plan:   Goals Addressed             This Visit's Progress    COMPLETED: VBCI Transitions of Care (TOC) Care Plan       Problems: (reviewed 11/10/23) Recent Hospitalization for treatment of Dementia Limited social support: The spouse is the primary support and this is a new diagnosis for the patient  -   Goal: (reviewed 11/10/23) Over the next 30 days, the patient will not experience hospital readmission  Interventions: (reviewed 11/10/23)  Dementia: Evaluation of current treatment plan related to misuse of: Alzheimer's dementia Emotional Support Provided to patient/caregiver, Sleep assessment completed, Sleep hygiene recommendations and education provided, Consideration of in-home help encouraged , Discussed Health Care Power of [place] , and Advised to contact provider for new or worsening symptoms Driving test scheduled for 11/08/23 to assess competency ST referral for choking episode - Rule Out Aspiration (completed) Magnesium  oxide 200mg  daily Referral to Neurology - 11/08/23 - completed Sleep study due to insomnia Referral to Pulmonology - 10/26/23 (completed) Speech Therapy twice weekly for cognitive training and education - completed  Patient Self Care Activities: (reviewed 11/10/23) Attend all scheduled provider appointments Call pharmacy for medication refills 3-7 days in advance of running out of medications Call provider office for new concerns or questions  Notify RN Care Manager of TOC call rescheduling needs Participate in Transition of Care Program/Attend TOC scheduled calls Take medications as prescribed   Fall Precautions - The patient is on Apixaban . Educated on increased risk of bleeding  Plan:  The patient has completed the 30 Day Outreach Program.  Referral to Longitudinal CCM        The patient verbalized understanding of instructions, educational materials, and care plan provided today and agreed to receive a mailed copy of patient instructions, educational materials, and care plan.   The patient has been provided with contact information for the care management team and has been advised to call with any health related questions or concerns.   Please call the care guide team at [phone] if you need to cancel or reschedule your appointment.   Please call the Suicide and Crisis Lifeline: 988 call the [place]  National Suicide Prevention Lifeline: 479-500-1531 or TTY: (330)827-4085 TTY (843) 792-8364) to talk to a trained counselor if you are experiencing a Mental Health or Behavioral Health Crisis or need someone to talk to.  Gareld June, BSN, RN [place]  VBCI - [employer] Health RN Care Manager [phone]

## 2023-11-11 ENCOUNTER — Ambulatory Visit: Admitting: Speech Pathology

## 2023-11-13 ENCOUNTER — Encounter: Payer: Self-pay | Admitting: Pulmonary Disease

## 2023-11-15 ENCOUNTER — Ambulatory Visit: Payer: Medicare HMO | Admitting: Dermatology

## 2023-11-15 ENCOUNTER — Encounter: Payer: Self-pay | Admitting: Dermatology

## 2023-11-15 DIAGNOSIS — D692 Other nonthrombocytopenic purpura: Secondary | ICD-10-CM | POA: Diagnosis not present

## 2023-11-15 DIAGNOSIS — L814 Other melanin hyperpigmentation: Secondary | ICD-10-CM | POA: Diagnosis not present

## 2023-11-15 DIAGNOSIS — L817 Pigmented purpuric dermatosis: Secondary | ICD-10-CM

## 2023-11-15 DIAGNOSIS — L578 Other skin changes due to chronic exposure to nonionizing radiation: Secondary | ICD-10-CM

## 2023-11-15 DIAGNOSIS — Z85820 Personal history of malignant melanoma of skin: Secondary | ICD-10-CM

## 2023-11-15 DIAGNOSIS — D1801 Hemangioma of skin and subcutaneous tissue: Secondary | ICD-10-CM

## 2023-11-15 DIAGNOSIS — L570 Actinic keratosis: Secondary | ICD-10-CM

## 2023-11-15 DIAGNOSIS — D229 Melanocytic nevi, unspecified: Secondary | ICD-10-CM

## 2023-11-15 DIAGNOSIS — W908XXA Exposure to other nonionizing radiation, initial encounter: Secondary | ICD-10-CM | POA: Diagnosis not present

## 2023-11-15 DIAGNOSIS — L821 Other seborrheic keratosis: Secondary | ICD-10-CM | POA: Diagnosis not present

## 2023-11-15 DIAGNOSIS — L739 Follicular disorder, unspecified: Secondary | ICD-10-CM

## 2023-11-15 DIAGNOSIS — Z1283 Encounter for screening for malignant neoplasm of skin: Secondary | ICD-10-CM | POA: Diagnosis not present

## 2023-11-15 DIAGNOSIS — Z79899 Other long term (current) drug therapy: Secondary | ICD-10-CM

## 2023-11-15 DIAGNOSIS — I872 Venous insufficiency (chronic) (peripheral): Secondary | ICD-10-CM

## 2023-11-15 DIAGNOSIS — Z7189 Other specified counseling: Secondary | ICD-10-CM

## 2023-11-15 MED ORDER — MOMETASONE FUROATE 0.1 % EX CREA: 1.0000 | TOPICAL_CREAM | CUTANEOUS | 1 refills | Status: DC

## 2023-11-15 MED ORDER — CLINDAMYCIN PHOSPHATE 1 % EX LOTN: TOPICAL_LOTION | Freq: Every day | CUTANEOUS | 3 refills | Status: DC

## 2023-11-15 NOTE — Patient Instructions (Addendum)
 For lower legs Start Mometasone cream 1-2 times a day as needed for itching, avoid using on face, under arms and in groin  For posterior neck Start Clindamycin lotion 1-2 times a day Start Mometasone cream 1-2 times a day as needed when flared      Due to recent changes in healthcare laws, you may see results of your pathology and/or laboratory studies on MyChart before the doctors have had a chance to review them. We understand that in some cases there may be results that are confusing or concerning to you. Please understand that not all results are received at the same time and often the doctors may need to interpret multiple results in order to provide you with the best plan of care or course of treatment. Therefore, we ask that you please give us  2 business days to thoroughly review all your results before contacting the office for clarification. Should we see a critical lab result, you will be contacted sooner.   If You Need Anything After Your Visit  If you have any questions or concerns for your doctor, please call our main line at [phone] and press option 4 to reach your doctor's medical assistant. If no one answers, please leave a voicemail as directed and we will return your call as soon as possible. Messages left after 4 pm will be answered the following business day.   You may also send us  a message via MyChart. We typically respond to MyChart messages within 1-2 business days.  For prescription refills, please ask your pharmacy to contact our office. Our fax number is [phone].  If you have an urgent issue when the clinic is closed that cannot wait until the next business day, you can page your doctor at the number below.    Please note that while we do our best to be available for urgent issues outside of office hours, we are not available 24/7.   If you have an urgent issue and are unable to reach us , you may choose to seek medical care at your doctor's office,  retail clinic, urgent care center, or emergency room.  If you have a medical emergency, please immediately call 911 or go to the emergency department.  Pager Numbers  - Dr. Bary Likes: [phone]  - Dr. Annette Barters: [phone]  - Dr. Felipe Horton: [phone]   In the event of inclement weather, please call our main line at [phone] for an update on the status of any delays or closures.  Dermatology Medication Tips: Please keep the boxes that topical medications come in in order to help keep track of the instructions about where and how to use these. Pharmacies typically print the medication instructions only on the boxes and not directly on the medication tubes.   If your medication is too expensive, please contact our office at [phone] option 4 or send us  a message through MyChart.   We are unable to tell what your co-pay for medications will be in advance as this is different depending on your insurance coverage. However, we may be able to find a substitute medication at lower cost or fill out paperwork to get insurance to cover a needed medication.   If a prior authorization is required to get your medication covered by your insurance company, please allow us  1-2 business days to complete this process.  Drug prices often vary depending on where the prescription is filled and some pharmacies may offer cheaper prices.  The website www.goodrx.com contains coupons for medications through different  pharmacies. The prices here do not account for what the cost may be with help from insurance (it may be cheaper with your insurance), but the website can give you the price if you did not use any insurance.  - You can print the associated coupon and take it with your prescription to the pharmacy.  - You may also stop by our office during regular business hours and pick up a GoodRx coupon card.  - If you need your prescription sent electronically to a different pharmacy, notify our office  through [facility] or by phone at [phone] option 4.     Si Usted Necesita Algo Despus de Su Visita  Tambin puede enviarnos un mensaje a travs de Clinical cytogeneticist. Por lo general respondemos a los mensajes de MyChart en el transcurso de 1 a 2 das hbiles.  Para renovar recetas, por favor pida a su farmacia que se ponga en contacto con nuestra oficina. Franz Jacks de fax es [place] [phone].  Si tiene un asunto urgente cuando la clnica est cerrada y que no puede esperar hasta el siguiente da hbil, puede llamar/localizar a su doctor(a) al nmero que aparece a continuacin.   Por favor, tenga en cuenta que aunque hacemos todo lo posible para estar disponibles para asuntos urgentes fuera del horario de [place], no estamos disponibles las 24 horas del da, los 7 [address]  [address] de la [place].   Si tiene un problema urgente y no puede comunicarse con nosotros, puede optar por buscar atencin mdica  en el consultorio de su doctor(a), en una clnica privada, en un centro de atencin urgente o en una sala de emergencias.  Si tiene Engineer, drilling, por favor llame inmediatamente al 911 o vaya a la sala de emergencias.  Nmeros de bper  - Dr. Bary Likes: [phone]  - Dra. Annette Barters: [phone]  - Dr. Felipe Horton: [phone]   En caso de inclemencias del tiempo, por favor llame a Lajuan Pila principal al [phone] para una actualizacin sobre el [place] de cualquier retraso o cierre.  Consejos para la medicacin en dermatologa: Por favor, guarde las cajas en las que vienen los medicamentos de uso tpico para ayudarle a seguir las instrucciones sobre dnde y cmo usarlos. Las farmacias generalmente imprimen las instrucciones del medicamento slo en las cajas y no directamente en los tubos del [place].   Si su medicamento es muy caro, por favor, pngase en contacto con Bettyjane Brunet llamando al [phone] y presione la opcin 4 o envenos un mensaje a travs de Clinical cytogeneticist.   No  podemos decirle cul ser su copago por los medicamentos por adelantado ya que esto es diferente dependiendo de la cobertura de su seguro. Sin embargo, es posible que podamos encontrar un medicamento sustituto a Audiological scientist un formulario para que el seguro cubra el medicamento que se considera necesario.   Si se requiere una autorizacin previa para que su compaa de seguros [place] su medicamento, por favor permtanos de 1 a 2 das hbiles para completar este proceso.  Los precios de los medicamentos varan con frecuencia dependiendo del Environmental consultant de dnde se surte la receta y alguna farmacias pueden ofrecer precios ms baratos.  El sitio web www.goodrx.com tiene cupones para medicamentos de Health and safety inspector. Los precios aqu no tienen en cuenta lo que podra costar con la ayuda del seguro (puede ser ms barato con su seguro), pero el sitio web puede darle el precio si no utiliz Tourist information centre manager.  - Puede imprimir el cupn correspondiente y llevarlo con  su receta a la farmacia.  - Tambin puede pasar por nuestra oficina durante el horario de atencin regular y Education officer, museum una tarjeta de cupones de GoodRx.  - Si necesita que su receta se enve electrnicamente a una farmacia diferente, informe a nuestra oficina a travs de MyChart de [place] o por telfono llamando al [phone] y presione la opcin 4.

## 2023-11-15 NOTE — Progress Notes (Signed)
 Follow-Up Visit   Subjective  Karl Myers is a 78 y.o. male who presents for the following: Skin Cancer Screening and Full Body Skin Exam Hx of Melanoma, check spot nose, hx of LN2 in past, legs red, hx of swelling, bumps post neck, pt picks at  Patient accompanied by wife who contributes to history.  The patient presents for Total-Body Skin Exam (TBSE) for skin cancer screening and mole check. The patient has spots, moles and lesions to be evaluated, some may be new or changing and the patient may have concern these could be cancer.    The following portions of the chart were reviewed this encounter and updated as appropriate: medications, allergies, medical history  Review of Systems:  No other skin or systemic complaints except as noted in HPI or Assessment and Plan.  Objective  Well appearing patient in no apparent distress; mood and affect are within normal limits.  A full examination was performed including scalp, head, eyes, ears, nose, lips, neck, chest, axillae, abdomen, back, buttocks, bilateral upper extremities, bilateral lower extremities, hands, feet, fingers, toes, fingernails, and toenails. All findings within normal limits unless otherwise noted below.   Relevant physical exam findings are noted in the Assessment and Plan.  Scalp x 10, forehead x 2, R temple x 1, R cheek x 2, R nasal tip x 1, nasal root x 1, L cheek x 3 (20) Pink scaly macules  Assessment & Plan   SKIN CANCER SCREENING PERFORMED TODAY.  LENTIGINES, SEBORRHEIC KERATOSES, HEMANGIOMAS - Benign normal skin lesions - Benign-appearing - Call for any changes  MELANOCYTIC NEVI - Tan-brown and/or pink-flesh-colored symmetric macules and papules - Benign appearing on exam today - Observation - Call clinic for new or changing moles - Recommend daily use of broad spectrum spf 30+ sunscreen to sun-exposed areas.   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  - Call if any new or changing lesions are noted between office visits    LENTIGO vs SK R post Crown scalp Exam: 0.7cm speckled tan macule   Treatment Plan: Benign-appearing. Stable compared to previous visit. Observation.  Call clinic for new or changing moles.  Recommend daily use of broad spectrum spf 30+ sunscreen to sun-exposed areas.    SEBORRHEIC KERATOSIS R pretibia Exam: 0.6cm speckled tan waxy macule R pretibia   Treatment: Benign-appearing. Stable compared to previous visit. Observation.  Call clinic for new or changing moles.  Recommend daily use of broad spectrum spf 30+ sunscreen to sun-exposed areas.    STASIS DERMATITIS Exam: lower legs with erythema and scale with associated pitting edema and Schamberg's pigmented purpura  Chronic and persistent condition with duration or expected duration over one year. Condition is symptomatic/ bothersome to patient. Not currently at goal.   Stasis in the legs causes chronic leg swelling, which may result in itchy or painful rashes, skin discoloration, skin texture changes, and sometimes ulceration.  Recommend daily graduated compression hose/stockings- easiest to put on first thing in morning, remove at bedtime.  Elevate legs as much as possible. Avoid salt/sodium rich foods.  Treatment Plan: Start Mometasone cream qd to bid to lower legs prn itching, rash, avoid f/g/a  Topical steroids (such as triamcinolone , fluocinolone, fluocinonide, mometasone, clobetasol, halobetasol, betamethasone , hydrocortisone) can cause thinning and lightening of the skin  if they are used for too long in the same area. Your physician has selected the right strength medicine for your problem and area affected on the body. Please use your medication only as directed by your physician to  prevent side effects.   FOLLICULITIS Posterior neck Exam: crusted excoriated papules post neck  Chronic and persistent condition with duration or expected duration over one year. Condition is bothersome/symptomatic for patient. Currently flared.   Folliculitis occurs due to inflammation of the superficial hair follicle (pore), resulting in acne-like lesions (pus bumps). It can be infectious (bacterial, fungal) or noninfectious (shaving, tight clothing, heat/sweat, medications).  Folliculitis can be acute or chronic and recommended treatment depends on the underlying cause of folliculitis.  Treatment Plan: Start Clindamycin lotion qd to bid Start Mometasone cr qd/bid prn flares, avoid f/g/a  Topical steroids (such as triamcinolone , fluocinolone, fluocinonide, mometasone, clobetasol, halobetasol, betamethasone , hydrocortisone) can cause thinning and lightening of the skin if they are used for too long in the same area. Your physician has selected the right strength medicine for your problem and area affected on the body. Please use your medication only as directed by your physician to prevent side effects.   Purpura - Chronic; persistent and recurrent.  Treatable, but not curable. - Violaceous macules and patches - Benign - Related to trauma, age, sun damage and/or use of blood thinners, chronic use of topical and/or oral steroids - Observe - Can use OTC arnica containing moisturizer such as Dermend Bruise Formula if desired - Call for worsening or other concerns AK (ACTINIC KERATOSIS) (20) Scalp x 10, forehead x 2, R temple x 1, R cheek x 2, R nasal tip x 1, nasal root x 1, L cheek x 3 (20) Actinic keratoses are precancerous spots that appear secondary to cumulative UV radiation exposure/sun exposure over time. They are chronic with expected duration over 1 year. A portion of actinic keratoses will progress to squamous cell carcinoma of the skin. It is not possible to reliably predict which  spots will progress to skin cancer and so treatment is recommended to prevent development of skin cancer.  Recommend daily broad spectrum sunscreen SPF 30+ to sun-exposed areas, reapply every 2 hours as needed.  Recommend staying in the shade or wearing long sleeves, sun glasses (UVA+UVB protection) and wide brim hats (4-inch brim around the entire circumference of the hat). Call for new or changing lesions. Destruction of lesion - Scalp x 10, forehead x 2, R temple x 1, R cheek x 2, R nasal tip x 1, nasal root x 1, L cheek x 3 (20)  Destruction method: cryotherapy   Informed consent: discussed and consent obtained   Lesion destroyed using liquid nitrogen: Yes   Region frozen until ice ball extended beyond lesion: Yes   Outcome: patient tolerated procedure well with no complications   Post-procedure details: wound care instructions given   Additional details:  Prior to procedure, discussed risks of blister formation, small wound, skin dyspigmentation, or rare scar following cryotherapy. Recommend Vaseline ointment to treated areas while healing.   ACTINIC DAMAGE WITH PRECANCEROUS ACTINIC KERATOSES Counseling for Topical Chemotherapy Management: Patient exhibits: - Severe, confluent actinic changes with pre-cancerous actinic keratoses that is secondary to cumulative UV radiation exposure over time - Condition that is severe; chronic, not at goal. - diffuse scaly erythematous macules and papules with underlying dyspigmentation - Discussed Prescription "Field Treatment" topical Chemotherapy for Severe, Chronic Confluent Actinic Changes with Pre-Cancerous Actinic Keratoses Field treatment involves treatment of an entire  area of skin that has confluent Actinic Changes (Sun/ Ultraviolet light damage) and PreCancerous Actinic Keratoses by method of PhotoDynamic Therapy (PDT) and/or prescription Topical Chemotherapy agents such as 5-fluorouracil , 5-fluorouracil /calcipotriene, and/or imiquimod.  The  purpose is to decrease the number of clinically evident and subclinical PreCancerous lesions to prevent progression to development of skin cancer by chemically destroying early precancer changes that may or may not be visible.  It has been shown to reduce the risk of developing skin cancer in the treated area. As a result of treatment, redness, scaling, crusting, and open sores may occur during treatment course. One or more than one of these methods may be used and may have to be used several times to control, suppress and eliminate the PreCancerous changes. Discussed treatment course, expected reaction, and possible side effects. - Recommend daily broad spectrum sunscreen SPF 30+ to sun-exposed areas, reapply every 2 hours as needed.  - Staying in the shade or wearing long sleeves, sun glasses (UVA+UVB protection) and wide brim hats (4-inch brim around the entire circumference of the hat) are also recommended. - Call for new or changing lesions.  - Recommend Red light PDT treatment vs 5FU/Calcipotriene to scalp, pt declines at this time    Return in about 6 months (around 05/16/2024) for TBSE, Hx of Melanoma, Hx of AKs.  I, Rollie Clipper, RMA, am acting as scribe for Artemio Larry, MD .   Documentation: I have reviewed the above documentation for accuracy and completeness, and I agree with the above.  Artemio Larry, MD

## 2023-11-16 ENCOUNTER — Ambulatory Visit: Admitting: Speech Pathology

## 2023-11-17 ENCOUNTER — Encounter: Admitting: Speech Pathology

## 2023-11-17 ENCOUNTER — Ambulatory Visit (INDEPENDENT_AMBULATORY_CARE_PROVIDER_SITE_OTHER)

## 2023-11-17 VITALS — BP 128/74 | HR 74 | Temp 97.8°F | Resp 16 | Ht 68.0 in | Wt 152.0 lb

## 2023-11-17 DIAGNOSIS — Z720 Tobacco use: Secondary | ICD-10-CM | POA: Diagnosis not present

## 2023-11-17 DIAGNOSIS — J449 Chronic obstructive pulmonary disease, unspecified: Secondary | ICD-10-CM | POA: Diagnosis not present

## 2023-11-17 MED ORDER — PREDNISONE 20 MG PO TABS: 20.0000 mg | ORAL_TABLET | Freq: Every day | ORAL | 0 refills | Status: AC

## 2023-11-17 NOTE — Patient Instructions (Addendum)
-   Please use Trelegy Ellipta  one puff daily. If you continue to get wheezing, cough despite using this then try the Albuterol  rescue inhaler, this one you can use every six hourly as needed. Please let us  know if you are needing to use the rescue inhaler every 6 hourly.   - Take Prednisone  20 mg, in the morning time with food for 5 days.   - If you develop fever, chills, productive cough despite above treatment I would want to see you in the clinic and get a chest x-ray. If not let's plan on seeing you in 6 months.    - As discussed in the clinic if change mental status, fever, unable to eat/drink he should be seen in the ED.   - Next pulmonology/lung doctor appointment is on 12/30/23.

## 2023-11-17 NOTE — Progress Notes (Signed)
 Established Patient Office Visit   Subjective  Patient ID: Karl Myers, male    DOB: 1945-09-20  Age: 78 y.o. MRN: [id number]  Chief Complaint  Patient presents with   Nasal Congestion   Cough    He  has a past medical history of Actinic keratosis, Acute hypoxic respiratory failure (HCC) (06/08/2023), Acute otitis media (05/08/2023), AKI (acute kidney injury) (HCC), Arthritis, COPD (chronic obstructive pulmonary disease) (HCC), COVID-19 virus infection (06/08/2023), DDD (degenerative disc disease), cervical, DDD (degenerative disc disease), cervical, Dementia (HCC), Depression, History of COVID-19, Hypertension, Melanoma (HCC) (09/04/2019), Pneumonia due to infectious organism (06/15/2023), and Pre-diabetes.  HPI H/O obtained from both patient and his wife who is present during today's appointment.    Cough, congestion, h/o COPD: Has a cough at baseline. Wife reports patient has been coughing through out the day, dry cough which is different from his baseline for the last 2-3 weeks.  He has a h/o COPD, is established with pulmonology. He has been using his rescue albuterol  inhaler, but has not been using Trelegy Ellipta  daily. No fever, chills, nausea, vomiting, no swelling. Has tried Nyquil, tussin. Continues to smoke cigarettes about 10/day.   Dementia: interval change since last appointment with me: Saw neurologist Dr. Mason Sole on 11/08/23. 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 Was started on mirtazapine 7.5 mg in the evening to aid sleep and stimulate appetite. F/U in 3 months was recommended. He gave his driver's license after his neurology appointment.   ROS As per HPI    Objective:      BP 128/74   Pulse 74   Temp 97.8 F (36.6 C)   Resp 16   Ht 5' 8 (1.727 m)   Wt 152 lb (68.9 kg)   SpO2 97%   BMI 23.11 kg/m      10/27/2023    2:22 PM 10/14/2023    9:16 AM 10/07/2023    10:00 AM  Depression screen PHQ 2/9  Decreased Interest 1 1 0  Down, Depressed, Hopeless 1 1 0  PHQ - 2 Score 2 2 0  Altered sleeping 3 3 3   Tired, decreased energy 1 1 3   Change in appetite 0 2 0  Feeling bad or failure about yourself  1 0 0  Trouble concentrating 0 1 0  Moving slowly or fidgety/restless 0 2 3  Suicidal thoughts 0 0 0  PHQ-9 Score 7 11 9   Difficult doing work/chores Somewhat difficult Not difficult at all Not difficult at all      10/14/2023    9:16 AM 10/07/2023   10:00 AM 08/16/2023   10:03 AM 04/30/2023    9:51 AM  GAD 7 : Generalized Anxiety Score  Nervous, Anxious, on Edge 1 3 0 2  Control/stop worrying 1 0 0 0  Worry too much - different things 1 0 0 0  Trouble relaxing 1 1 0 0  Restless 1 3 0 0  Easily annoyed or irritable 1 1 0 0  Afraid - awful might happen 0 0 0 0  Total GAD 7 Score 6 8 0 2  Anxiety Difficulty Somewhat difficult Not difficult at all Not difficult at all Not difficult at all      10/27/2023    2:22 PM 10/14/2023    9:16 AM 10/07/2023   10:00 AM  Depression screen PHQ 2/9  Decreased Interest 1 1 0  Down, Depressed, Hopeless 1 1 0  PHQ - 2 Score 2 2 0  Altered sleeping 3 3 3   Tired, decreased energy 1 1 3   Change in appetite 0 2 0  Feeling bad or failure about yourself  1 0 0  Trouble concentrating 0 1 0  Moving slowly or fidgety/restless 0 2 3  Suicidal thoughts 0 0 0  PHQ-9 Score 7 11 9   Difficult doing work/chores Somewhat difficult Not difficult at all Not difficult at all      10/14/2023    9:16 AM 10/07/2023   10:00 AM 08/16/2023   10:03 AM 04/30/2023    9:51 AM  GAD 7 : Generalized Anxiety Score  Nervous, Anxious, on Edge 1 3 0 2  Control/stop worrying 1 0 0 0  Worry too much - different things 1 0 0 0  Trouble relaxing 1 1 0 0  Restless 1 3 0 0  Easily annoyed or irritable 1 1 0 0  Afraid - awful might happen 0 0 0 0  Total GAD 7 Score 6 8 0 2  Anxiety Difficulty Somewhat difficult Not difficult at all Not difficult  at all Not difficult at all   SDOH Screenings   Food Insecurity: No Food Insecurity (11/08/2023)   Received from [facility]  Housing: Low Risk  (11/08/2023)   Received from [facility]  Transportation Needs: No Transportation Needs (11/08/2023)   Received from [facility]  Utilities: Not At Risk (11/08/2023)   Received from [facility]  Alcohol Screen: Low Risk  (03/30/2023)  Depression (PHQ2-9): Medium Risk (10/27/2023)  Financial Resource Strain: Low Risk  (11/08/2023)   Received from [facility]  Physical Activity: Inactive (03/30/2023)  Social Connections: Socially Integrated (03/30/2023)  Stress: No Stress Concern Present (03/30/2023)  Tobacco Use: High Risk (11/17/2023)  Health Literacy: Adequate Health Literacy (03/30/2023)     Physical Exam HENT:     Head: Normocephalic and atraumatic.  Cardiovascular:     Rate and Rhythm: Normal rate.  Pulmonary:     Breath sounds: Wheezing (bibasilar wheezings on both inspiration and expiration, no obvious crackles on auscultation) present.  Abdominal:     General: Bowel sounds are normal.     Palpations: Abdomen is soft.  Musculoskeletal:     Right lower leg: No edema.     Left lower leg: No edema.  Lymphadenopathy:     Cervical: No cervical adenopathy.  Neurological:     Mental Status: He is alert and oriented to person, place, and time.  Psychiatric:        Mood and Affect: Mood normal.        No results found for any visits on 11/17/23.  The ASCVD Risk score (Arnett DK, et al., 2019) failed to calculate for the following reasons:   Risk score cannot be calculated because patient has a medical history suggesting prior/existing ASCVD     Assessment & Plan:   Chronic obstructive pulmonary disease, unspecified COPD type (HCC) Assessment & Plan: Not taking medications as prescribed.  Counseled patient and his wife on importance, benefit  of daily control medications.  Start using Trelegy Ellipta , one puff daily.  Start Prednisone  20 mg, daily (take it in mornings with food) for 5 days.  Use rescue albuterol  inhaler 2 puffs every 6 hourly prn if above doesn't help with cough, wheezing.  F/U if symptoms persists. If change in mental status, fever, chills recommend emergent evaluation. Consider chest x-ray if symptoms persists.  F/U with pulmonology as scheduled in July.   Orders: -     predniSONE ; Take 1 tablet (20 mg total) by mouth daily with breakfast for 5 days.  Dispense: 5 tablet; Refill: 0  Tobacco abuse Assessment & Plan: Brief counseling  on smoking cessation provided during today's visit. Patient declines pharmacological intervention, f/u during next visit as well.     I spent 30 minutes on the day of this face-to-face encounter reviewing the patient's ongoing concerns, and reviewing the assessment and plan with the patient and his wife in detail. This time also included counseling the patient on smoking cessation, importance of adhering to his treatment.    Return in about 6 months (around 05/18/2024) for Chronic.   Jacklin Mascot, MD

## 2023-11-17 NOTE — Assessment & Plan Note (Addendum)
 Not taking medications as prescribed.  Counseled patient and his wife on importance, benefit of daily control medications.  Start using Trelegy Ellipta , one puff daily.  Start Prednisone  20 mg, daily (take it in mornings with food) for 5 days.  Use rescue albuterol  inhaler 2 puffs every 6 hourly prn if above doesn't help with cough, wheezing.  F/U if symptoms persists. If change in mental status, fever, chills recommend emergent evaluation. Consider chest x-ray if symptoms persists.  F/U with pulmonology as scheduled in July.

## 2023-11-17 NOTE — Assessment & Plan Note (Signed)
 Brief counseling  on smoking cessation provided during today's visit. Patient declines pharmacological intervention, f/u during next visit as well.

## 2023-11-18 ENCOUNTER — Ambulatory Visit: Admitting: Speech Pathology

## 2023-11-25 ENCOUNTER — Telehealth: Payer: Self-pay

## 2023-11-25 NOTE — Progress Notes (Signed)
 Complex Care Management Note  Care Guide Note 11/25/2023 Name: Karl Myers MRN: [id number] DOB: 03/14/1946  Karl Myers is a 78 y.o. year old male who sees Bair, Kalpana, MD for primary care. I reached out to Adline Hook by phone today to offer complex care management services.  Karl Myers was given information about Complex Care Management services today including:   The Complex Care Management services include support from the care team which includes your Nurse Care Manager, Clinical Social Worker, or Pharmacist.  The Complex Care Management team is here to help remove barriers to the health concerns and goals most important to you. Complex Care Management services are voluntary, and the patient may decline or stop services at any time by request to their care team member.   Complex Care Management Consent Status: Patient agreed to services and verbal consent obtained.   Follow up plan:  Telephone appointment with complex care management team member scheduled for:  12/07/23 at 11:00 a.m.   Encounter Outcome:  Patient Scheduled  [facility]  [facility], [facility] Care Management Assistant Direct Dial: [phone]  Fax: [phone]

## 2023-12-07 ENCOUNTER — Other Ambulatory Visit: Payer: Self-pay

## 2023-12-07 NOTE — Patient Outreach (Signed)
 Complex Care Management   Visit Note  12/07/2023  Name:  Karl Myers MRN: [id number] DOB: [birth year]  Situation: Referral received for Complex Care Management related to Dementia I obtained verbal consent from Patient.  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 Breslow's   Pneumonia due to infectious organism 06/15/2023   Pre-diabetes     Assessment: Patient Reported Symptoms:  Cognitive Cognitive Status: Alert and oriented to person, place, and time, Normal speech and language skills, Insightful and able to interpret abstract concepts   Health Maintenance Behaviors: Annual physical exam, Hobbies, Healthy diet, Immunizations, Sleep adequate Healing Pattern: Average Health Facilitated by: Healthy diet, Pain control, Rest  Neurological Neurological Review of Symptoms: No symptoms reported Neurological Comment: 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  HEENT HEENT Symptoms Reported: No symptoms reported      Cardiovascular Cardiovascular Symptoms Reported: No symptoms reported Does patient have uncontrolled Hypertension?: No Cardiovascular Management Strategies: Medication therapy, Routine screening Cardiovascular Self-Management Outcome: 4 (good)  Respiratory Respiratory Symptoms Reported: Shortness of breath, Dry cough Additional Respiratory Details: 8-10 cigarettes/day -states this in much less Respiratory Management Strategies:  Medication therapy, Routine screening Respiratory Self-Management Outcome: 3 (uncertain)  Endocrine Endocrine Symptoms Reported: No symptoms reported Is patient diabetic?: No Endocrine Comment: patient states borderline diabetic - eats what he is given by wife, sweet tea  Gastrointestinal Gastrointestinal Symptoms Reported: Constipation Additional Gastrointestinal Details: Managed with OTC meds - no issues now Gastrointestinal Management Strategies: Medication therapy    Genitourinary Genitourinary Symptoms Reported: No symptoms reported    Integumentary Integumentary Symptoms Reported: No symptoms reported Additional Integumentary Details: Neck and leg skin issues esolved Skin Management Strategies: Medication therapy  Musculoskeletal Musculoskelatal Symptoms Reviewed: Limited mobility Additional Musculoskeletal Details: due to back pain, goes slow and limits activity due to pain Musculoskeletal Management Strategies: Adequate rest, Medication therapy, Routine screening Musculoskeletal Self-Management Outcome: 3 (uncertain) Falls in the past year?: No Number of falls in past year: 1 or less Was there an injury with Fall?: No Fall Risk Category Calculator: 0 Patient Fall Risk Level: Low Fall Risk Patient at Risk for Falls Due to: Impaired mobility, History of fall(s) Fall risk Follow up: Falls evaluation completed  Psychosocial Psychosocial Symptoms Reported: Anxiety - if selected complete GAD, Irritability Additional Psychological Details: states nervous at times, states has always short tempered Behavioral Management Strategies: Adequate rest, Support system Techniques to Cope with Loss/Stress/Change: Diversional activities, Medication Quality of Family Relationships: involved, supportive Do you feel physically threatened by others?: No      12/07/2023   11:25 AM  Depression screen PHQ 2/9  Decreased Interest 1  Down, Depressed, Hopeless 0  PHQ - 2 Score 1    There were  no vitals filed for this visit.  Medications Reviewed Today     Reviewed by Devra Lands, RN (Registered Nurse) on 12/07/23 at 1109  Med List Status: <  None>   Medication Order Taking? Sig Documenting Provider Last Dose Status Informant  albuterol  (VENTOLIN  HFA) 108 (90 Base) MCG/ACT inhaler [id number] Yes Inhale 2 puffs into the lungs every 6 (six) hours as needed for wheezing or shortness of breath. Tamea Dedra CROME, MD  Active   apixaban  (ELIQUIS ) 5 MG TABS tablet [id number] Yes TAKE ONE TABLET BY MOUTH TWICE DAILY Gollan, Timothy J, MD  Active Self, Spouse/Significant Other, Pharmacy Records  atenolol  (TENORMIN ) 25 MG tablet [id number] Yes Take 1 tablet (25 mg total) by mouth at bedtime. Maribeth Camellia MATSU, MD  Active Self, Spouse/Significant Other, Pharmacy Records  atorvastatin  (LIPITOR) 80 MG tablet [id number] Yes Take 1 tablet (80 mg total) by mouth daily. Marylynn Verneita CROME, MD  Active   Cholecalciferol (VITAMIN D3) 25 MCG (1000 UT) CAPS [id number] Yes Take by mouth. [provider]  Active   clindamycin  (CLEOCIN -T) 1 % lotion [id number] Yes Apply topically daily. qd to bid to posterior neck Jackquline Sawyer, MD  Active   cyanocobalamin  (VITAMIN B12) 1000 MCG tablet [id number] Yes Take 1 tablet (1,000 mcg total) by mouth daily. Babara Call, MD  Active Self, Spouse/Significant Other, Pharmacy Records  Fluticasone -Umeclidin-Vilant (TRELEGY ELLIPTA ) 100-62.5-25 MCG/ACT AEPB [id number] Yes Inhale 1 puff into the lungs daily. Patsy Lenis, MD  Active   Fluticasone -Umeclidin-Vilant (TRELEGY ELLIPTA ) 100-62.5-25 MCG/ACT AEPB [id number]  Inhale 1 Inhalation into the lungs daily in the afternoon. Tamea Dedra CROME, MD  Active   Magnesium  200 MG TABS [id number] Yes Take 1 tablet by mouth daily in the afternoon. [provider]  Active   magnesium  oxide (MAG-OX) tablet 200 mg [id number]   Bair, Kalpana, MD  Active   meloxicam (MOBIC) 15 MG tablet [id number]  Take 15 mg by mouth daily.  Patient  not taking: Reported on 12/07/2023   [provider]  Active   mirtazapine (REMERON) 7.5 MG tablet [id number] Yes Take 7.5 mg by mouth at bedtime.  Patient taking differently: Take 15 mg by mouth at bedtime. Per wife dose increase by provider last week with improvement in sleep   [provider]  Active   mometasone  (ELOCON ) 0.1 % cream [id number] Yes Apply 1 Application topically as directed. qd to bid to aa lower legs prn itching, qd to bid aa bumps post neck prn flared, avoid face, groin, axilla Jackquline Sawyer, MD  Active   nitroGLYCERIN  (NITROSTAT ) 0.4 MG SL tablet [id number] Yes Place 1 tablet (0.4 mg total) under the tongue every 5 (five) minutes as needed for chest pain. For up to 3 doses per episode. Maribeth Camellia MATSU, MD  Active Self, Spouse/Significant Other, Pharmacy Records  valsartan  (DIOVAN ) 320 MG tablet [id number] Yes Take 320 mg by mouth daily. [provider]  Active             Recommendation:   PCP Follow-up Continue Current Plan of Care  Follow Up Plan:   Telephone follow-up in 1 week due to patient planned vacation  Nestora Duos, MSN, RN [facility]  [facility], [facility] RN Care Manager Direct Dial: [phone] Fax: [phone]

## 2023-12-07 NOTE — Patient Instructions (Signed)
 Visit Information  Thank you for taking time to visit with me today. Please don't hesitate to contact me if I can be of assistance to you before our next scheduled appointment.  Our next appointment is by telephone on 12/13/2023 at 9:00 am Please call the care guide team at [phone] if you need to cancel or reschedule your appointment.   Following is a copy of your care plan:   Goals Addressed             This Visit's Progress    VBC[facility] RN Care Plan - Dementia       Problems:  Chronic Disease Management support and education needs related to Dementia  Goal: Over the next 90 days the Patient will  attend all scheduled medical appointments: PCP and specialist as evidenced by no misse appointments        continue to work with RN Care Manager and/or Social Worker to address care management and care coordination needs related to Dementia as evidenced by adherence to care management team scheduled appointments     take all medications exactly as prescribed and will call provider for medication related questions as evidenced by verbalizing compliance with medications during medication reconciliation    verbalize basic understanding of Dementia disease process and self health management plan as evidenced by verbalizing signs and symptoms/changes to report to provider, lifestyle modifications for safety, engaging with additional support systems/resources as needed  Interventions:   Dementia: Evaluation of current treatment plan related to Vascular/Multi-infarct dementia, mild PHQ2/ PHQ9 completed,  Emotional Support Provided to patient/caregiver,  Sleep hygiene recommendations and education provided; Mailed Encouraged patient/caregiver counseling/support: Aware of LCSW/BSW  declined needs at this time,  Discussed Health Care Power of Attorney: Has [facility] Power of Attorney on file  Discussed importance of attendance to all provider appointments: Wife manages all appointments and transportation,  aware of schedule Advised to contact provider for new or worsening symptoms: Verbalized understanding to notify provider of changes is behavior and mood.   Patient Self-Care Activities:  Attend all scheduled provider appointments Call pharmacy for medication refills 3-7 days in advance of running out of medications Call provider office for new concerns or questions  Take medications as prescribed    Plan:  Telephone follow up appointment with care management team member scheduled for:  12/13/2023 at 10:00 am             Please call the Suicide and Crisis Lifeline: 988 call the [place]  National Suicide Prevention Lifeline: (952)543-8219 or TTY: 757-718-7364 TTY (757) 659-5086) to talk to a trained counselor call 1-800-273-TALK (toll free, 24 hour hotline) go to [facility] [address], [place] [phone]) call 911 if you are experiencing a Mental Health or Behavioral Health Crisis or need someone to talk to.  The patient verbalized understanding of instructions, educational materials, and care plan provided today and agreed to receive a mailed copy of patient instructions, educational materials, and care plan.   Nestora Duos, MSN, RN [place]  [facility], [facility] RN Care Manager Direct Dial: [phone] Fax: [phone]   Dementia Dementia is a condition that affects the way the brain works. It often affects thinking and memory.  There are many types of dementia, including: Alzheimer's disease. This is the most common type. Vascular dementia. This type may happen due to a stroke. Lewy body dementia. This type may happen to people who have Parkinson's disease. Frontotemporal dementia. This type is caused by damage to nerve cells  in certain parts of the brain. Some people may have more than one type. What are the causes? Dementia is caused by damage to cells in the brain. Some causes that can't be  reversed include: Having a condition that affects the blood vessels of the brain. This may be diabetes or heart disease. Changes to genes. Some causes that can be reversed or slowed down include: Injury to the brain due to: A growth called a tumor. A blood clot. Too much fluid in the brain. Taking certain medicines. An infection. Problems with your thyroid . Not having enough vitamin B12 in the body. Having a disease that causes your body's defense system, called the immune system, to attack healthy parts of your body. What are the signs or symptoms? Symptoms of dementia start slowly and get worse with time. They may include: Problems remembering events or people. Getting lost easily. Forgetting appointments or to pay bills. Having trouble taking a bath or putting clothes on. Having trouble planning and making meals. Having trouble speaking. Changes in behavior or mood. How is this diagnosed? Dementia may be diagnosed based on: Your symptoms and medical history. A physical exam. Tests. These may include: Tests to check your thinking and memory to see how your brain is working. Lab tests. You may have tests on your blood or pee (urine). Imaging tests, such as a CT scan, a PET scan, or an MRI. Genetic testing. This may be done if other family members have had dementia. Your health care provider will talk with you and your family, friends, or caregivers about your history and symptoms. How is this treated? Treatment depends on the cause of the dementia and should start as soon as possible. It might include: Taking medicines for symptoms. Taking medicines to help control or slow down the dementia. Treating the cause of your dementia. Your provider can help you find support groups and other members of the health care team who can help with your care. Follow these instructions at home: Medicines Take medicines only as told by your provider. Use a pill organizer or pill reminder to  help you keep track of your medicines. Avoid taking medicines for pain or for sleep. These can affect your thinking. Lifestyle Make healthy choices. Be active as told by your provider. Do not smoke, vape, or use products with nicotine  or tobacco in them. If you need help quitting, talk with your provider. Do not drink alcohol. When you feel a lot of stress, do something that helps you relax. Your provider can give you tips. Spend time with other people. Make sure you get good sleep at night. These tips can help: Try not to take naps during the day. Keep your bedroom dark and cool. Do not exercise in the few hours before you go to bed. Do not have foods or drinks with caffeine at night. Eating and drinking Drink enough fluid to keep your pee pale yellow. Eat a healthy diet. General instructions  Talk with your provider to decide on: What things you need help with. What your safety needs are. Ask your provider if it's safe for you to drive. If told, wear a bracelet that tracks where you are or shows that you're a person with memory loss. Work with your family to make big legal or health decisions. This may include things like advance directives, medical power of attorney, or a living will. Where to find more information Alzheimer's Association: WesternTunes.it [employer] on Aging: BaseRingTones.pl World Health Organization: VisitDestination.com.br Contact a  health care provider if: You have any new symptoms. Your symptoms get worse. You have problems with swallowing. Get help right away if: You feel very sad or feel like you may hurt yourself or others. You have thoughts about taking your own life. Your family members are worried about your safety. These symptoms may be an emergency. Take one of these steps right away: Go to your nearest emergency room. Call 911. Call the National Suicide Prevention Lifeline at (978)468-3234 or 988. Text the Crisis Text Line at 401-278-9167. This information is not  intended to replace advice given to you by your health care provider. Make sure you discuss any questions you have with your health care provider. Document Revised: 03/25/2023 Document Reviewed: 08/10/2022 Elsevier Patient Education  2024 Elsevier Inc.  Quality Sleep Information, Adult Quality sleep is important for your mental and physical health. It also improves your quality of life. Quality sleep means you: Are asleep for most of the time you are in bed. Fall asleep within 30 minutes. Wake up no more than once a night. Are awake for no longer than 20 minutes if you do wake up during the night. Most adults need 7-8 hours of quality sleep each night. How can poor sleep affect me? If you do not get enough quality sleep, you may have: Mood swings. Daytime sleepiness. Decreased alertness, reaction time, and concentration. Sleep disorders, such as insomnia and sleep apnea. Difficulty with: Solving problems. Coping with stress. Paying attention. These issues may affect your performance and productivity at work, school, and home. Lack of sleep may also put you at higher risk for accidents, suicide, and risky behaviors. If you do not get quality sleep, you may also be at higher risk for several health problems, including: Infections. Type 2 diabetes. Heart disease. High blood pressure. Obesity. Worsening of long-term conditions, like arthritis, kidney disease, depression, Parkinson's disease, and epilepsy. What actions can I take to get more quality sleep? Sleep schedule and routine Stick to a sleep schedule. Go to sleep and wake up at about the same time each day. Do not try to sleep less on weekdays and make up for lost sleep on weekends. This does not work. Limit naps during the day to 30 minutes or less. Do not take naps in the late afternoon. Make time to relax before bed. Reading, listening to music, or taking a hot bath promotes quality sleep. Make your bedroom a place that  promotes quality sleep. Keep your bedroom dark, quiet, and at a comfortable room temperature. Make sure your bed is comfortable. Avoid using electronic devices that give off bright blue light for 30 minutes before bedtime. Your brain perceives bright blue light as sunlight. This includes television, phones, and computers. If you are lying awake in bed for longer than 20 minutes, get up and do a relaxing activity until you feel sleepy. Lifestyle     Try to get at least 30 minutes of exercise on most days. Do not exercise 2-3 hours before going to bed. Do not use any products that contain nicotine  or tobacco. These products include cigarettes, chewing tobacco, and vaping devices, such as e-cigarettes. If you need help quitting, ask your health care provider. Do not drink caffeinated beverages for at least 8 hours before going to bed. Coffee, tea, and some sodas contain caffeine. Do not drink alcohol or eat large meals close to bedtime. Try to get at least 30 minutes of sunlight every day. Morning sunlight is best. Medical concerns Work with  your health care provider to treat medical conditions that may affect sleeping, such as: Nasal obstruction. Snoring. Sleep apnea and other sleep disorders. Talk to your health care provider if you think any of your prescription medicines may cause you to have difficulty falling or staying asleep. If you have sleep problems, talk with a sleep consultant. If you think you have a sleep disorder, talk with your health care provider about getting evaluated by a specialist. Where to find more information Sleep Foundation: sleepfoundation.org American Academy of Sleep Medicine: aasm.org Centers for Disease Control and Prevention (CDC): TonerPromos.no Contact a health care provider if: You have trouble getting to sleep or staying asleep. You often wake up very early in the morning and cannot get back to sleep. You have daytime sleepiness. You have daytime sleep attacks  of suddenly falling asleep and sudden muscle weakness (narcolepsy). You have a tingling sensation in your legs with a strong urge to move your legs (restless legs syndrome). You stop breathing briefly during sleep (sleep apnea). You think you have a sleep disorder or are taking a medicine that is affecting your quality of sleep. Summary Most adults need 7-8 hours of quality sleep each night. Getting enough quality sleep is important for your mental and physical health. Make your bedroom a place that promotes quality sleep, and avoid things that may cause you to have poor sleep, such as alcohol, caffeine, smoking, or large meals. Talk to your health care provider if you have trouble falling asleep or staying asleep. This information is not intended to replace advice given to you by your health care provider. Make sure you discuss any questions you have with your health care provider. Document Revised: 09/17/2021 Document Reviewed: 09/17/2021 Elsevier Patient Education  2024 [employer].

## 2023-12-13 ENCOUNTER — Other Ambulatory Visit: Payer: Self-pay

## 2023-12-13 NOTE — Patient Outreach (Signed)
 Complex Care Management   Visit Note  12/13/2023  Name:  Karl Myers MRN: [id number] DOB: 1945/10/17  Situation: Referral received for Complex Care Management related to Dementia I obtained verbal consent from Patient.  Visit completed with Patient and Caregiver  on the phone  Background:   Past Medical History:  Diagnosis Date   Actinic keratosis    Acute hypoxic respiratory failure (HCC) 06/08/2023   Acute otitis media 05/08/2023   AKI (acute kidney injury) (HCC)    Arthritis    COPD (chronic obstructive pulmonary disease) (HCC)    COVID-19 virus infection 06/08/2023   DDD (degenerative disc disease), cervical    DDD (degenerative disc disease), cervical    Dementia (HCC)    Depression    History of COVID-19    Hypertension    Melanoma (HCC) 09/04/2019   R 5th toe lateral tip, Stage IV, 0.76mm Breslow's   Pneumonia due to infectious organism 06/15/2023   Pre-diabetes     Assessment: Patient Reported Symptoms:  Cognitive Cognitive Status: Alert and oriented to person, place, and time, Insightful and able to interpret abstract concepts, Normal speech and language skills   Health Maintenance Behaviors: Annual physical exam, Hobbies, Healthy diet, Immunizations, Sleep adequate Healing Pattern: Average Health Facilitated by: Pain control, Healthy diet, Rest  Neurological Neurological Review of Symptoms: No symptoms reported Neurological Management Strategies: Adequate rest, Activity, Exercise, Medication therapy, Routine screening Neurological Comment: Wife and patient report no issues/changes  HEENT HEENT Symptoms Reported: Not assessed      Cardiovascular Cardiovascular Symptoms Reported: No symptoms reported Does patient have uncontrolled Hypertension?: No Cardiovascular Management Strategies: Medication therapy, Routine screening, Diet modification, Adequate rest, Activity Cardiovascular Comment: Reports occasionally checks BP, wife and patient report no sx Afib   Respiratory Respiratory Symptoms Reported: Not assesed    Endocrine Endocrine Symptoms Reported: Not assessed Is patient diabetic?: No    Gastrointestinal Gastrointestinal Symptoms Reported: Not assessed      Genitourinary Genitourinary Symptoms Reported: Not assessed    Integumentary Integumentary Symptoms Reported: No symptoms reported Additional Integumentary Details: States legs and neck remain clear    Musculoskeletal Musculoskelatal Symptoms Reviewed: Limited mobility Additional Musculoskeletal Details: Reports no change Musculoskeletal Management Strategies: Adequate rest, Medication therapy, Routine screening Falls in the past year?: No Number of falls in past year: 1 or less Was there an injury with Fall?: No Fall Risk Category Calculator: 0 Patient Fall Risk Level: Low Fall Risk Patient at Risk for Falls Due to: History of fall(s), Impaired mobility Fall risk Follow up: Falls evaluation completed, Falls prevention discussed  Psychosocial Psychosocial Symptoms Reported: No symptoms reported Additional Psychological Details: Reports doing better - Remeron 15 mg nightly with improved sleep, sleep hygeine reviewed as patient has not yet received amilings and going on vacation Behavioral Management Strategies: Adequate rest, Support system   Quality of Family Relationships: involved, supportive Do you feel physically threatened by others?: No      12/07/2023   11:25 AM  Depression screen PHQ 2/9  Decreased Interest 1  Down, Depressed, Hopeless 0  PHQ - 2 Score 1    There were no vitals filed for this visit.  Medications Reviewed Today   Medications were not reviewed in this encounter     Recommendation:   PCP Follow-up Continue Current Plan of Care  Follow Up Plan:   Telephone follow-up in 1 month  Nestora Duos, MSN, RN [facility]  [facility], [facility] RN Care Manager Direct Dial: [phone] Fax: [phone]

## 2023-12-13 NOTE — Patient Instructions (Signed)
 Visit Information  Thank you for taking time to visit with me today. Please don't hesitate to contact me if I can be of assistance to you before our next scheduled appointment.  Your next care management appointment is by telephone on 01/11/2024 at 10:00 am  Telephone follow-up in 1 month  Please call the care guide team at [phone] if you need to cancel, schedule, or reschedule an appointment.   Please call the Suicide and Crisis Lifeline: 988 call the [place]  National Suicide Prevention Lifeline: 402-478-4270 or TTY: 339-767-9195 TTY 412-868-3591) to talk to a trained counselor call 1-800-273-TALK (toll free, 24 hour hotline) go to [facility] [address], [place] [phone]) call 911 if you are experiencing a Mental Health or Behavioral Health Crisis or need someone to talk to.  Nestora Duos, MSN, RN [facility], [facility] RN Care Manager Direct Dial: [phone] Fax: [phone]

## 2023-12-14 ENCOUNTER — Other Ambulatory Visit: Payer: Self-pay

## 2023-12-14 ENCOUNTER — Ambulatory Visit

## 2023-12-14 MED ORDER — ATENOLOL 25 MG PO TABS: 25.0000 mg | ORAL_TABLET | Freq: Every day | ORAL | 1 refills | Status: DC

## 2023-12-15 ENCOUNTER — Ambulatory Visit (INDEPENDENT_AMBULATORY_CARE_PROVIDER_SITE_OTHER): Admitting: Internal Medicine

## 2023-12-15 ENCOUNTER — Encounter: Payer: Self-pay | Admitting: Internal Medicine

## 2023-12-15 VITALS — BP 122/74 | HR 50 | Temp 97.5°F | Ht 68.0 in | Wt 152.6 lb

## 2023-12-15 DIAGNOSIS — R7989 Other specified abnormal findings of blood chemistry: Secondary | ICD-10-CM | POA: Diagnosis not present

## 2023-12-15 DIAGNOSIS — I10 Essential (primary) hypertension: Secondary | ICD-10-CM

## 2023-12-15 DIAGNOSIS — R5382 Chronic fatigue, unspecified: Secondary | ICD-10-CM | POA: Diagnosis not present

## 2023-12-15 DIAGNOSIS — E538 Deficiency of other specified B group vitamins: Secondary | ICD-10-CM

## 2023-12-15 LAB — CBC WITH DIFFERENTIAL/PLATELET
Basophils Absolute: 0.1 K/uL (ref 0.0–0.1)
Basophils Relative: 0.7 % (ref 0.0–3.0)
Eosinophils Absolute: 0.8 K/uL — ABNORMAL HIGH (ref 0.0–0.7)
Eosinophils Relative: 6.4 % — ABNORMAL HIGH (ref 0.0–5.0)
HCT: 48.2 % (ref 39.0–52.0)
Hemoglobin: 16 g/dL (ref 13.0–17.0)
Lymphocytes Relative: 33.2 % (ref 12.0–46.0)
Lymphs Abs: 4.1 K/uL — ABNORMAL HIGH (ref 0.7–4.0)
MCHC: 33.3 g/dL (ref 30.0–36.0)
MCV: 98 fl (ref 78.0–100.0)
Monocytes Absolute: 1.3 K/uL — ABNORMAL HIGH (ref 0.1–1.0)
Monocytes Relative: 10.4 % (ref 3.0–12.0)
Neutro Abs: 6.1 K/uL (ref 1.4–7.7)
Neutrophils Relative %: 49.3 % (ref 43.0–77.0)
Platelets: 188 K/uL (ref 150.0–400.0)
RBC: 4.92 Mil/uL (ref 4.22–5.81)
RDW: 17.7 % — ABNORMAL HIGH (ref 11.5–15.5)
WBC: 12.4 K/uL — ABNORMAL HIGH (ref 4.0–10.5)

## 2023-12-15 LAB — TSH: TSH: 0.89 u[IU]/mL (ref 0.35–5.50)

## 2023-12-15 LAB — VITAMIN B12: Vitamin B-12: 783 pg/mL (ref 211–911)

## 2023-12-15 LAB — VITAMIN D 25 HYDROXY (VIT D DEFICIENCY, FRACTURES): VITD: 48.63 ng/mL (ref 30.00–100.00)

## 2023-12-15 NOTE — Assessment & Plan Note (Signed)
-   Patient complains of worsening fatigue over the last few months but denies any shortness of breath or chest pain or active bleeding -Will check a vitamin D , TSH and CBC levels today -I suspect this is likely multifactorial secondary to his aging as well as multiple comorbidities like COPD -No further workup at this time

## 2023-12-15 NOTE — Progress Notes (Signed)
 Acute Office Visit  Subjective:     Patient ID: Karl Myers, male    DOB: 07-03-1945, 78 y.o.   MRN: [id number]  Chief Complaint  Patient presents with   Acute Visit    Discuss B12 injections    HPI Patient is in today for worsening fatigue and to discuss possible vitamin B12 injections.  Patient states that he has been feeling more tired over the last few months but denies any shortness of breath or chest pain or active bleeding.  He does have a diagnosis of dementia and his children told him that he needs vitamin B12 injections and would like to discuss getting this as well.  Review of Systems  Constitutional:  Positive for malaise/fatigue.  HENT: Negative.    Respiratory: Negative.    Cardiovascular: Negative.   Musculoskeletal: Negative.   Neurological: Negative.   Psychiatric/Behavioral:  Positive for memory loss.         Objective:    BP 122/74   Pulse (!) 50   Temp (!) 97.5 F (36.4 C)   Ht 5' 8 (1.727 m)   Wt 152 lb 9.6 oz (69.2 kg)   SpO2 94%   BMI 23.20 kg/m    Physical Exam Constitutional:      Appearance: Normal appearance.  HENT:     Head: Normocephalic and atraumatic.  Cardiovascular:     Rate and Rhythm: Normal rate and regular rhythm.     Heart sounds: Normal heart sounds.  Pulmonary:     Breath sounds: Normal breath sounds. No wheezing or rales.  Musculoskeletal:        General: No tenderness.  Neurological:     Mental Status: He is alert. Mental status is at baseline.  Psychiatric:        Mood and Affect: Mood normal.        Behavior: Behavior normal.     No results found for any visits on 12/15/23.      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 -Will recheck vitamin B12 level today - If his vitamin B12 is low despite supplementation will initiate B12 injections -Continue with current vitamin B12 supplementation - No further workup at this time       Relevant Orders   Vitamin B12   Fatigue   - Patient complains of worsening fatigue over the last few months but denies any shortness of breath or chest pain or active bleeding -Will check a vitamin D , TSH and CBC levels today -I suspect this is likely multifactorial secondary to his aging as well as multiple comorbidities like COPD -No further workup at this time      Relevant Orders   VITAMIN D  25 Hydroxy (Vit-D Deficiency, Fractures)   CBC with Differential/Platelet  Vitamin B12   TSH   Low TSH level   - Patient has a history of low TSH but most recent TSH last year was within normal limits -He is not on any medication currently -Does complain of some worsening fatigue -We will recheck TSH today for further evaluation -No further workup at this time      Relevant Orders   TSH    No orders of the defined types were placed in this encounter.   No follow-ups on file.  Lamaya Hyneman, MD

## 2023-12-15 NOTE — Patient Instructions (Addendum)
-   It was a pleasure meeting you again today -We will check your vitamin B12, TSH and vitamin D  levels and I will try to let you know your results in the next day or so -There is no indication for vitamin B12 injections at this time unless your vitamin B12 levels are low - Please contact us  with any questions or concerns or if you need any refills

## 2023-12-15 NOTE — Assessment & Plan Note (Signed)
-   This problem is chronic and stable -Patient blood pressure is at goal today- 122/74 - Will continue with valsartan  320 mg daily, atenolol  25 mg daily -No further workup at this time

## 2023-12-15 NOTE — Assessment & Plan Note (Addendum)
-   Patient has a history of low vitamin B12 and is on vitamin B12 supplementation -Patient is concerned about a possible low vitamin B12 and was inquiring about vitamin B12 injections -I explained to the patient that oral supplementation works as well as injections unless there is some difficulty with absorption from the gut. -Will recheck vitamin B12 level today - If his vitamin B12 is low despite supplementation will initiate B12 injections -Continue with current vitamin B12 supplementation - No further workup at this time

## 2023-12-15 NOTE — Assessment & Plan Note (Signed)
-   Patient has a history of low TSH but most recent TSH last year was within normal limits -He is not on any medication currently -Does complain of some worsening fatigue -We will recheck TSH today for further evaluation -No further workup at this time

## 2023-12-16 ENCOUNTER — Ambulatory Visit: Payer: Self-pay | Admitting: Internal Medicine

## 2023-12-26 NOTE — Progress Notes (Unsigned)
 Cardiology Office Note  Date:  12/28/2023   ID:  Myers, Karl 12/04/1945, MRN [id number]  PCP:  Abbey Bruckner, MD   Chief Complaint  Patient presents with   Follow-up    12 month f/u no complaints today. Meds reviewed verbally with pt.    HPI:  [name] is a 78 year old gentleman with past medical history of HTN Smoker, quit last month.  Smoked for 50 years or more Anxiety Coronary artery disease on previous CT scan Fatigue, with exertion, Who presents for f/u of his fatigue, coronary artery disease, afib   Last seen in clinic 7/24 Presents today with his wife I have dementia Wife reports that they are followed by neurology, Dr. Loreli  He has been walking  the floor of the house at night Now on a sleeping pill at night and doing better  In atrial fibrillation on today's visit, asymptomatic Denies shortness of breath, no chest pain, no leg swelling January 2025 was normal sinus rhythm Uncertain how long he has been in atrial fibrillation as he has no symptoms  Spends time working in the garden, does the mowing  Prior memory problems on travels overseas to [place]. Karl Myers in [place] last year  Karl Myers  5 twice daily, gets this from the [place]  Previous lab work reviewed with him in detail Total chol 131, LDL 65 HBA1C 6.2  EKG personally reviewed by myself on todays visit EKG Interpretation Date/Time:  Tuesday December 28 2023 09:50:09 EDT Ventricular Rate:  72 PR Interval:    QRS Duration:  68 QT Interval:  396 QTC Calculation: 433 R Axis:   32  Text Interpretation: Atrial fibrillation Low voltage QRS When compared with ECG of 08-Jun-2023 03:39, Atrial fibrillation has replaced Sinus rhythm Nonspecific T wave abnormality now evident in Inferior leads Confirmed by Perla Lye [phone]) on 12/28/2023 10:18:36 AM   covid 07/2019 bilateral multifocal infiltrates and hypoxia  remdesivir  and steroids.  brief atrial fibrillation  Anticoagulation was started  for stroke risk reduction and will continue with Karl Myers . 5th and final infusion of remdesivir  on 2/19 and complete a course of oral decadron  as outpatient. HbA1c 6.5%.   Stress test 07/30/2017, reviewed with him in detail There was no ST segment deviation noted during stress. The study is normal. This is a low risk study. The left ventricular ejection fraction is normal (55-65%).  works in the garden [employer]    PMH:   has a past medical history of Actinic keratosis, Acute hypoxic respiratory failure (HCC) (06/08/2023), Acute otitis media (05/08/2023), AKI (acute kidney injury) (HCC), Arthritis, COPD (chronic obstructive pulmonary disease) (HCC), COVID-19 virus infection (06/08/2023), DDD (degenerative disc disease), cervical, DDD (degenerative disc disease), cervical, Dementia (HCC), Dementia (HCC), Depression, History of COVID-19, Hypertension, Melanoma (HCC) (09/04/2019), Pneumonia due to infectious organism (06/15/2023), and Pre-diabetes.  PSH:    Past Surgical History:  Procedure Laterality Date   BACK SURGERY     x 3   CARPAL TUNNEL RELEASE Right 08/19/2017   Procedure: CARPAL TUNNEL RELEASE;  Surgeon: Karl Sharper, MD;  Location: [facility] ORS;  Service: Orthopedics;  Laterality: Right;   CATARACT EXTRACTION W/PHACO Right 11/17/2016   Procedure: CATARACT EXTRACTION PHACO AND INTRAOCULAR LENS PLACEMENT (IOC);  Surgeon: Karl Fallow, MD;  Location: [facility] ORS;  Service: Ophthalmology;  Laterality: Right;  US  00:37 AP% 12.6 CDE 4.69 Fluid pack lot # [id number]   CATARACT EXTRACTION W/PHACO Left 12/08/2016   Procedure: CATARACT EXTRACTION PHACO AND INTRAOCULAR LENS PLACEMENT (IOC);  Surgeon: Karl,  Elsie, MD;  Location: [facility] ORS;  Service: Ophthalmology;  Laterality: Left;  US  00:33 AP% 13.9 CDE 4.63 Fluid pack lot # [id number]   TOE AMPUTATION Left    ULNAR TUNNEL RELEASE Right 08/19/2017   Procedure: CUBITAL TUNNEL RELEASE;  Surgeon: Karl Sharper, MD;  Location: [facility] OR[facility];   Service: Orthopedics;  Laterality: Right;    Current Outpatient Medications  Medication Sig Dispense Refill   albuterol  (VENTOLIN  HFA) 108 (90 Base) MCG/ACT inhaler Inhale 2 puffs into the lungs every 6 (six) hours as needed for wheezing or shortness of breath. 8 g 2   apixaban  (Karl Myers ) 5 MG TABS tablet TAKE ONE TABLET BY MOUTH TWICE DAILY 180 tablet 1   atenolol  (TENORMIN ) 25 MG tablet Take 1 tablet (25 mg total) by mouth at bedtime. 90 tablet 1   atorvastatin  (LIPITOR) 80 MG tablet Take 1 tablet (80 mg total) by mouth daily. 90 tablet 3   Cholecalciferol (VITAMIN D3) 25 MCG (1000 UT) CAPS Take by mouth.     clindamycin  (CLEOCIN -T) 1 % lotion Apply topically daily. qd to bid to posterior neck 60 mL 3   cyanocobalamin  (VITAMIN B12) 1000 MCG tablet Take 1 tablet (1,000 mcg total) by mouth daily. 90 tablet 1   Fluticasone -Umeclidin-Vilant (TRELEGY ELLIPTA ) 100-62.5-25 MCG/ACT AEPB Inhale 1 puff into the lungs daily. 28 each 2   Magnesium  200 MG TABS Take 1 tablet by mouth daily in the afternoon.     mirtazapine (REMERON) 7.5 MG tablet Take 7.5 mg by mouth at bedtime.     mometasone  (ELOCON ) 0.1 % cream Apply 1 Application topically as directed. qd to bid to aa lower legs prn itching, qd to bid aa bumps post neck prn flared, avoid face, groin, axilla 45 g 1   nitroGLYCERIN  (NITROSTAT ) 0.4 MG SL tablet Place 1 tablet (0.4 mg total) under the tongue every 5 (five) minutes as needed for chest pain. For up to 3 doses per episode. 50 tablet 0   valsartan  (DIOVAN ) 320 MG tablet Take 320 mg by mouth daily.     Fluticasone -Umeclidin-Vilant (TRELEGY ELLIPTA ) 100-62.5-25 MCG/ACT AEPB Inhale 1 Inhalation into the lungs daily in the afternoon. (Patient not taking: Reported on 12/28/2023) 2 each 0   meloxicam (MOBIC) 15 MG tablet Take 15 mg by mouth daily. (Patient not taking: Reported on 12/28/2023)     Current Facility-Administered Medications  Medication Dose Route Frequency Provider Last Rate Last Admin    magnesium  oxide (MAG-OX) tablet 200 mg  200 mg Oral Daily          Allergies:   Hydrocodone, Hydrocodone-acetaminophen , and Neurontin  [gabapentin ]   Social History:  The patient  reports that he has been smoking cigars and cigarettes. He has a 16.3 pack-year smoking history. He has never used smokeless tobacco. He reports that he does not drink alcohol and does not use drugs.   Family History:   family history includes Breast cancer (age of onset: 7) in his sister; Cancer in his brother; Dementia in his sister; Heart attack in his mother; Prostate cancer in his father.    Review of Systems: Review of Systems  Constitutional: Negative.   HENT: Negative.    Respiratory: Negative.    Cardiovascular: Negative.   Gastrointestinal: Negative.   Musculoskeletal: Negative.   Neurological: Negative.   Psychiatric/Behavioral:  Positive for memory loss.   All other systems reviewed and are negative.   PHYSICAL EXAM: VS:  BP 104/60 (BP Location: Left Arm, Patient Position: Sitting, Cuff Size: Normal)  Pulse 72   Ht 5' 8 (1.727 m)   Wt 158 lb (71.7 kg)   SpO2 97%   BMI 24.02 kg/m  , BMI Body mass index is 24.02 kg/m. Constitutional:  oriented to person, place, and time. No distress.  HENT:  Head: Grossly normal Eyes:  no discharge. No scleral icterus.  Neck: No JVD, no carotid bruits  Cardiovascular: Irregularly irregular, no murmurs appreciated Pulmonary/Chest: Clear to auscultation bilaterally, no wheezes or rails Abdominal: Soft.  no distension.  no tenderness.  Musculoskeletal: Normal range of motion Neurological:  normal muscle tone. Coordination normal. No atrophy Skin: Skin warm and dry Psychiatric: normal affect, pleasant  Recent Labs: 06/08/2023: ALT 17 10/14/2023: BUN 15; Creatinine, Ser 1.21; Magnesium  2.1; Potassium 4.4; Sodium 138 12/15/2023: Hemoglobin 16.0; Platelets 188.0; TSH 0.89    Lipid Panel Lab Results  Component Value Date   CHOL 131 09/11/2022   HDL  46.80 09/11/2022   LDLCALC 65 09/11/2022   TRIG 95.0 09/11/2022      Wt Readings from Last 3 Encounters:  12/28/23 158 lb (71.7 kg)  12/15/23 152 lb 9.6 oz (69.2 kg)  11/17/23 152 lb (68.9 kg)     ASSESSMENT AND PLAN:  Coronary artery disease of native artery of native heart with stable angina pectoris (HCC) Significant coronary disease seen on CT scan 50 years of smoking,  Denies anginal symptoms Cholesterol close to goal, continue Lipitor 80 daily No further testing needed at this time  Chronic fatigue -  Prior stress test with no ischemia 2019 Prior COVID Exacerbated by dementia, recommended regular exercise program  Tobacco abuse Cessation recommended especially in light of underlying coronary disease  Essential hypertension -  Blood pressure is well controlled on today's visit. No changes made to the medications.  Memory loss/dementia Followed by Dr. Maree, neurology Recommend regular exercise program  Mixed hyperlipidemia -  Cholesterol at goal on statin  Atrial fibrillation Noted on EKG today asymptomatic Unclear if this is paroxysmal or persistent Given underlying dementia we will try to avoid general anesthesia and cardioversions Rate well-controlled, tolerating Karl Myers  5 twice daily    Orders Placed This Encounter  Procedures   EKG 12-Lead     Signed, Velinda Lunger, M.D., Ph.D. 12/28/2023  [facility] [place], [place] [phone]

## 2023-12-28 ENCOUNTER — Encounter: Payer: Self-pay | Admitting: Cardiovascular Disease

## 2023-12-28 ENCOUNTER — Ambulatory Visit: Attending: Cardiovascular Disease | Admitting: Cardiovascular Disease

## 2023-12-28 VITALS — BP 104/60 | HR 72 | Ht 68.0 in | Wt 158.0 lb

## 2023-12-28 DIAGNOSIS — Z720 Tobacco use: Secondary | ICD-10-CM | POA: Diagnosis not present

## 2023-12-28 DIAGNOSIS — I25118 Atherosclerotic heart disease of native coronary artery with other forms of angina pectoris: Secondary | ICD-10-CM | POA: Diagnosis not present

## 2023-12-28 DIAGNOSIS — E782 Mixed hyperlipidemia: Secondary | ICD-10-CM

## 2023-12-28 DIAGNOSIS — I10 Essential (primary) hypertension: Secondary | ICD-10-CM | POA: Diagnosis not present

## 2023-12-28 DIAGNOSIS — I739 Peripheral vascular disease, unspecified: Secondary | ICD-10-CM

## 2023-12-28 DIAGNOSIS — I480 Paroxysmal atrial fibrillation: Secondary | ICD-10-CM

## 2023-12-28 DIAGNOSIS — J449 Chronic obstructive pulmonary disease, unspecified: Secondary | ICD-10-CM

## 2023-12-28 NOTE — Patient Instructions (Signed)
 Medication Instructions:  No changes  If you need a refill on your cardiac medications before your next appointment, please call your pharmacy.    Lab work: No new labs needed   Testing/Procedures: No new testing needed   Follow-Up: At [facility], you and your health needs are our priority.  As part of our continuing mission to provide you with exceptional heart care, we have created designated Provider Care Teams.  These Care Teams include your primary Cardiologist (physician) and Advanced Practice Providers (APPs -  Physician Assistants and Nurse Practitioners) who all work together to provide you with the care you need, when you need it.  You will need a follow up appointment in 6 months  Providers on your designated Care Team:   Nicolasa Ducking, NP Eula Listen, PA-C Cadence Fransico Michael, [place]  COVID-19 Vaccine Information can be found at: [email or web address] For questions related to vaccine distribution or appointments, please email [email or web address] or call [phone].

## 2023-12-29 DIAGNOSIS — M48062 Spinal stenosis, lumbar region with neurogenic claudication: Secondary | ICD-10-CM | POA: Diagnosis not present

## 2023-12-29 DIAGNOSIS — M5416 Radiculopathy, lumbar region: Secondary | ICD-10-CM | POA: Diagnosis not present

## 2023-12-30 ENCOUNTER — Encounter: Payer: Self-pay | Admitting: Pulmonary Disease

## 2023-12-30 ENCOUNTER — Ambulatory Visit: Admitting: Pulmonary Disease

## 2023-12-30 VITALS — BP 110/62 | HR 70 | Temp 97.1°F | Ht 68.0 in | Wt 159.6 lb

## 2023-12-30 DIAGNOSIS — R0602 Shortness of breath: Secondary | ICD-10-CM | POA: Diagnosis not present

## 2023-12-30 DIAGNOSIS — I4819 Other persistent atrial fibrillation: Secondary | ICD-10-CM

## 2023-12-30 DIAGNOSIS — J449 Chronic obstructive pulmonary disease, unspecified: Secondary | ICD-10-CM | POA: Diagnosis not present

## 2023-12-30 DIAGNOSIS — F17210 Nicotine dependence, cigarettes, uncomplicated: Secondary | ICD-10-CM

## 2023-12-30 NOTE — Progress Notes (Signed)
 Subjective:    Patient ID: Karl Myers, male    DOB: 08/18/1945, 78 y.o.   MRN: [id number]  Patient Care Team: Bair, Kalpana, MD as PCP - General (Family Medicine) Perla Evalene PARAS, MD as PCP - Cardiology (Cardiology) Babara Call, MD as Consulting Physician (Oncology) Devra Lands, RN as [facility] Care Management  Chief Complaint  Patient presents with   Follow-up    DOE. No wheezing. Dry cough.     BACKGROUND/INTERVAL:Patient is a 78 year old current smoker who has been followed here for the issue of moderate COPD. He was last seen on 26 Oct 2023. He presents today for follow-up of dry cough and shortness of breath on exertion.  He continues to smoke half a pack of cigarettes per day.  HPI Discussed the use of AI scribe software for clinical note transcription with the patient, who gave verbal consent to proceed.  History of Present Illness   Karl Myers is a 78 year old male with COPD who presents for follow-up.  He presents today with his wife Karl Myers.  His cough has improved since the last visit, and the inhaler is providing relief. Wheezing and shortness of breath have also improved. He continues to use Trelegy as part of his treatment regimen.  He experiences shortness of breath, which is worse with exertion. He is on Eliquis  for atrial fibrillation. During a recent visit to Dr. Perla czar recalls being told that cardioversion was not recommended because of his dementia unless his symptoms became severe.  He discusses his memory, stating 'I think it's pretty good' and expresses some doubt about his dementia diagnosis. He mentions a period when he was off his medications for about eight weeks while in [place], which affected his sleep until he received medication to help.  His sleep has improved. He notes that sleep deprivation was previously a significant issue.   He does not endorse any fevers, chills or sweats.  No orthopnea or paroxysmal nocturnal  dyspnea.  He continues to smoke half a pack of cigarettes per day and was counseled regards to discontinuation of smoking.     DATA 08/19/2021 PFTs: FEV1 1.61 L or 57% predicted, FVC 2.57 L or 65% predicted, FEV1/FVC 63%, mild concomitant restrictive defect, very mild diffusion defect.  Consistent with moderate obstructive defect on the basis of COPD, concomitant restrictive defect may be effort driven as the patient did have difficulty performing the test other possibilities include smoking-related interstitial lung disease. 09/09/2021 chest LDCT: Centrilobular emphysema, linear opacity in the lingula due to scarring or atelectasis, numerous solid pulmonary nodules bilaterally largest located in right lower lobe measuring 7.2 mm, lungs RADS 3S, follow-up in 6 months recommended.  Review of Systems A 10 point review of systems was performed and it is as noted above otherwise negative.   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   Low blood magnesium  10/07/2023   Insomnia 10/07/2023   Tobacco use disorder, continuous 10/07/2023   Tobacco abuse counseling 10/07/2023   Left sided lacunar stroke (HCC) 10/07/2023   Dementia (HCC) 09/14/2023   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   Adrenal nodule (HCC) 08/16/2023   COPD exacerbation (HCC) 06/08/2023   Depression 06/08/2023   PAD (peripheral artery disease) (HCC) 04/09/2023   Lumbar radiculopathy 03/15/2023   Fear of falling 12/09/2022   Macrocytosis 03/26/2022   Low serum vitamin B12 03/26/2022   Elevated MCV 02/03/2022   Memory difficulty 01/05/2022  Prediabetes 01/05/2022   History of melanoma 10/13/2019   Atrial fibrillation (HCC) 08/01/2019   Low TSH level 08/01/2019   COPD (chronic obstructive pulmonary disease) (HCC)    Anxiety    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   Family history of prostate cancer in father 06/17/2018   CAD (coronary  artery disease) 07/27/2017   Tobacco abuse 06/24/2017   Hypertension 03/12/2017   Hyperlipidemia 03/12/2017   Fatigue 03/12/2017   Sleeping difficulty 03/12/2017   Cervical radiculopathy 03/12/2017    Social History   Tobacco Use   Smoking status: Every Day    Current packs/day: 0.50    Average packs/day: 0.5 packs/day for 65.0 years (32.5 ttl pk-yrs)    Types: Cigars, Cigarettes   Smokeless tobacco: Never   Tobacco comments:    0.5PPD 12/30/2022 khj   Substance Use Topics   Alcohol use: No    Allergies  Allergen Reactions   Hydrocodone Itching   Hydrocodone-Acetaminophen  Other (See Comments)    With large quantities hyper   Neurontin  [Gabapentin ] Other (See Comments)    With large quantities hyper     Current Meds  Medication Sig   albuterol  (VENTOLIN  HFA) 108 (90 Base) MCG/ACT inhaler Inhale 2 puffs into the lungs every 6 (six) hours as needed for wheezing or shortness of breath.   apixaban  (ELIQUIS ) 5 MG TABS tablet TAKE ONE TABLET BY MOUTH TWICE DAILY   atenolol  (TENORMIN ) 25 MG tablet Take 1 tablet (25 mg total) by mouth at bedtime.   atorvastatin  (LIPITOR) 80 MG tablet Take 1 tablet (80 mg total) by mouth daily.   Cholecalciferol (VITAMIN D3) 25 MCG (1000 UT) CAPS Take by mouth.   clindamycin  (CLEOCIN -T) 1 % lotion Apply topically daily. qd to bid to posterior neck   cyanocobalamin  (VITAMIN B12) 1000 MCG tablet Take 1 tablet (1,000 mcg total) by mouth daily.   Fluticasone -Umeclidin-Vilant (TRELEGY ELLIPTA ) 100-62.5-25 MCG/ACT AEPB Inhale 1 puff into the lungs daily.   Magnesium  200 MG TABS Take 1 tablet by mouth daily in the afternoon.   mirtazapine (REMERON) 7.5 MG tablet Take 7.5 mg by mouth at bedtime.   mometasone  (ELOCON ) 0.1 % cream Apply 1 Application topically as directed. qd to bid to aa lower legs prn itching, qd to bid aa bumps post neck prn flared, avoid face, groin, axilla   nitroGLYCERIN  (NITROSTAT ) 0.4 MG SL tablet Place 1 tablet (0.4 mg total)  under the tongue every 5 (five) minutes as needed for chest pain. For up to 3 doses per episode.   valsartan  (DIOVAN ) 320 MG tablet Take 320 mg by mouth daily.   Current Facility-Administered Medications for the 12/30/23 encounter (Office Visit) with Tamea Dedra CROME, MD  Medication   magnesium  oxide (MAG-OX) tablet 200 mg    Immunization History  Administered Date(s) Administered   Fluad Quad(high Dose 65+) 02/20/2019, 03/07/2020, 04/21/2021   Fluad Trivalent(High Dose 65+) 03/15/2023   Influenza, High Dose Seasonal PF 03/12/2017, 03/31/2018   Influenza-Unspecified 02/20/2019, 04/09/2023   PFIZER(Purple Top)SARS-COV-2 Vaccination 10/27/2019, 12/01/2019   PNEUMOCOCCAL CONJUGATE-20 09/11/2022   Td 01/14/2016   Zoster Recombinant(Shingrix) 12/03/2020, 03/07/2021, 04/07/2021        Objective:     BP 110/62 (BP Location: Right Arm, Cuff Size: Normal)   Pulse 70   Temp (!) 97.1 F (36.2 C)   Ht 5' 8 (1.727 m)   Wt 159 lb 9.6 oz (72.4 kg)   SpO2 94%   BMI 24.27 kg/m   SpO2: 94 % O2 Device:  None (Room air)  GENERAL: Well, well-nourished gentleman, no acute distress, fully ambulatory.  No conversational dyspnea. HEAD: Normocephalic, atraumatic.  EYES: Pupils equal, round, reactive to light.  No scleral icterus.  MOUTH: Dentures uppers and lowers, oral mucosa moist.  No thrush. NECK: Supple. No thyromegaly. Trachea midline. No JVD.  No adenopathy. PULMONARY: Good air entry bilaterally.  Coarse, otherwise, no adventitious sounds. CARDIOVASCULAR: S1 and S2.  Irregular rate and rhythm with controlled ventricular response.  No rubs, murmurs or gallops heard. ABDOMEN: Benign. MUSCULOSKELETAL: No joint deformity, no clubbing, no edema.  NEUROLOGIC: No focal deficit, no gait disturbance, speech is fluent. SKIN: Intact,warm,dry. PSYCH: Impaired short-term memory.    Assessment & Plan:     ICD-10-CM   1. Stage 2 moderate COPD by GOLD classification (HCC)  J44.9     2.  Shortness of breath  R06.02     3. Persistent atrial fibrillation (HCC)  I48.19     4. Tobacco dependence due to cigarettes  F17.210      Discussion:    Chronic Obstructive Pulmonary Disease (COPD) COPD symptoms, including wheezing and dyspnea, have improved with Trelegy. Cough is also better. Lung auscultation reveals clear sounds. - Continue Trelegy - Follow up in four months  Atrial Fibrillation Atrial fibrillation contributes to exertional dyspnea. He is on anticoagulation therapy. Cardioversion is not considered due to dementia unless symptoms worsen.  Dementia Dementia diagnosis is questioned by him, but no changes in management are discussed. Improved sleep may positively impact cognitive function.      Advised if symptoms do not improve or worsen, to please contact office for sooner follow up or seek emergency care.    I spent 32 minutes of dedicated to the care of this patient on the date of this encounter to include pre-visit review of records, face-to-face time with the patient discussing conditions above, post visit ordering of testing, clinical documentation with the electronic health record, making appropriate referrals as documented, and communicating necessary findings to members of the patients care team.     C. Leita Sanders, MD Advanced Bronchoscopy PCCM [facility]    *This note was generated using voice recognition software/Dragon and/or AI transcription program.  Despite best efforts to proofread, errors can occur which can change the meaning. Any transcriptional errors that result from this process are unintentional and may not be fully corrected at the time of dictation.

## 2023-12-30 NOTE — Patient Instructions (Signed)
 VISIT SUMMARY:  Karl Myers, you came in today for a follow-up visit regarding your COPD, atrial fibrillation, and dementia. Your cough, wheezing, and shortness of breath have improved with your current treatment. You also mentioned that your sleep has gotten better, which is a positive change.  YOUR PLAN:  -CHRONIC OBSTRUCTIVE PULMONARY DISEASE (COPD): COPD is a chronic lung condition that makes it hard to breathe. Your symptoms, including wheezing and shortness of breath, have improved with the use of Trelegy. Please continue using Trelegy as prescribed. We will see you again in four months to monitor your progress.  If you are still smoking it is advised that you discontinue use of cigarettes.  -ATRIAL FIBRILLATION: Atrial fibrillation is an irregular heart rhythm that can cause shortness of breath, especially with exertion. You are currently on a blood thinner to manage this condition. Cardioversion, a procedure to restore normal heart rhythm, is not recommended at this time due to your dementia unless your symptoms become severe.  -DEMENTIA: Dementia is a condition that affects memory and cognitive function. You expressed some doubt about this diagnosis, but no changes to your treatment plan were made today. Improved sleep may help with your cognitive function.   INSTRUCTIONS:  Please continue using Trelegy as prescribed for your COPD. Follow up in four months for a re-evaluation of your COPD. If you experience any worsening of your symptoms or have any concerns, please contact our office.

## 2023-12-31 NOTE — Progress Notes (Signed)
 This patient is appearing on a report for being at risk of failing the adherence measure for cholesterol (statin) medications this calendar year.   Medication: atorvastatin  80  mg daily Last fill date: 11/08/23 for 90 day supply  Insurance report was not up to date. No action needed at this time.   Lorain Baseman, PharmD [facility] [phone]

## 2024-01-11 ENCOUNTER — Other Ambulatory Visit: Payer: Self-pay

## 2024-01-11 NOTE — Patient Instructions (Signed)
 Visit Information  Thank you for taking time to visit with me today. Please don't hesitate to contact me if I can be of assistance to you before our next scheduled appointment.  Your next care management appointment is by telephone on 03/14/2024 at 10:00 am  Telephone follow-up 2 months as requested Contact number provided if questions prior to appointment. Declined mailing of AVS.   Please call the care guide team at [phone] if you need to cancel, schedule, or reschedule an appointment.   Please call the Suicide and Crisis Lifeline: 988 call the [place]  National Suicide Prevention Lifeline: 4153189860 or TTY: (443) 066-9098 TTY [phone]) to talk to a trained counselor call 1-800-273-TALK (toll free, 24 hour hotline) go to [facility] [address] [place] [phone]) call 911 if you are experiencing a Mental Health or Behavioral Health Crisis or need someone to talk to.  Nestora Duos, MSN, RN [facility], [facility] RN Care Manager Direct Dial: [phone] Fax: [phone]

## 2024-01-11 NOTE — Patient Outreach (Signed)
 Complex Care Management   Visit Note  01/11/2024  Name:  Karl Myers MRN: [id number] DOB: [birth year]  Situation: Referral received for Complex Care Management related to Dementia I obtained verbal consent from Patient.  Visit completed with patient and wife [name]  on the phone  Background:   Past Medical History:  Diagnosis Date   Actinic keratosis    Acute hypoxic respiratory failure (HCC) 06/08/2023   Acute otitis media 05/08/2023   AKI (acute kidney injury) (HCC)    Arthritis    COPD (chronic obstructive pulmonary disease) (HCC)    COVID-19 virus infection 06/08/2023   DDD (degenerative disc disease), cervical    DDD (degenerative disc disease), cervical    Dementia (HCC)    Dementia (HCC)    Depression    History of COVID-19    Hypertension    Melanoma (HCC) 09/04/2019   R 5th toe lateral tip, Stage IV, 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   Pneumonia due to infectious organism 06/15/2023   Pre-diabetes     Assessment: Patient Reported Symptoms:  Cognitive Cognitive Status: Alert and oriented to person, place, and time, Insightful and able to interpret abstract concepts, Normal speech and language skills, Struggling with memory recall (Patient states doing better with memory I don't get lost - wife reports still issues with memory - when wakes up from nap needs reorienting) Cognitive/Intellectual Conditions Management [RPT]: None reported or documented in medical history or problem list   Health Maintenance Behaviors: Annual physical exam, Exercise, Healthy diet, Sleep adequate Healing Pattern: Average Health Facilitated by: Pain control, Rest, Healthy diet  Neurological Neurological Review of Symptoms: Weakness (States always weak - cannot do stuff he did before) Neurological Management Strategies: Adequate rest, Medication therapy, Routine screening Neurological Comment: Dementia  HEENT HEENT Symptoms Reported: No symptoms reported HEENT Management Strategies: Routine screening     Cardiovascular Cardiovascular Symptoms Reported: No symptoms reported Does patient have uncontrolled Hypertension?: No Cardiovascular Management Strategies: Medication therapy, Routine screening, Diet modification Cardiovascular Comment: denies sx of afib, checks BP a few times a week 110/80  Respiratory Respiratory Symptoms Reported: No symptoms reported Additional Respiratory Details: 8-10 small cigars size of cigarrettes  per day no interest in quitting and no safety issues Respiratory Management Strategies: Medication therapy, Routine screening Respiratory Self-Management Outcome: 4 (good)  Endocrine Endocrine Symptoms Reported: No symptoms reported Is patient diabetic?: No Endocrine Comment: tries to eat healthy - eats whatever wife provides - discussed limiting sweet tea - declined  Gastrointestinal Gastrointestinal Symptoms Reported: No symptoms reported      Genitourinary Genitourinary Symptoms Reported: No symptoms reported Genitourinary Comment: Discussed weaknees and confusion as potential signs of  UTI  Integumentary Integumentary Symptoms Reported: No symptoms reported Skin Management Strategies: Routine screening  Musculoskeletal Musculoskelatal Symptoms Reviewed: Limited mobility Additional Musculoskeletal Details: no change - describes weakness because cannot do what he did before, back injections - improved rates pain 1-2/10 Musculoskeletal Management Strategies: Routine screening, Medication therapy, Adequate rest Falls in the past year?: No Number of falls in past year: 1 or less Was there an injury with Fall?: No Fall Risk Category Calculator: 0 Patient Fall Risk Level: Low Fall Risk Patient at Risk for Falls Due to: History of fall(s), Impaired mobility Fall risk Follow up: Falls evaluation completed, Falls prevention discussed (Anticoagulent - aware ED for head bumps)  Psychosocial Psychosocial Symptoms Reported: No symptoms reported Additional Psychological  Details: I think my mood is good Behavioral Management Strategies: Adequate rest, Support system Techniques to Cope with Loss/Stress/Change: Diversional activities  Quality of Family Relationships: involved, helpful, supportive Do you feel physically threatened by others?: No      01/11/2024   10:26 AM  Depression screen PHQ 2/9  Decreased Interest 0  Down, Depressed, Hopeless 0  PHQ - 2 Score 0    Vitals:   01/11/24 1020  BP: 110/80    Medications Reviewed Today     Reviewed by Devra Lands, RN (Registered Nurse) on 01/11/24 at 1011  Med List Status: <None>   Medication Order Taking? Sig Documenting Provider Last Dose Status Informant  albuterol  (VENTOLIN  HFA) 108 (90 Base) MCG/ACT inhaler [id number] Yes Inhale 2 puffs into the lungs every 6 (six) hours as needed for wheezing or shortness of breath. Tamea Dedra CROME, MD  Active   apixaban  (ELIQUIS ) 5 MG TABS tablet [id number] Yes TAKE ONE TABLET BY MOUTH TWICE DAILY Gollan, Timothy J, MD  Active Self, Spouse/Significant Other, Pharmacy Records  atenolol  (TENORMIN ) 25 MG tablet [id number] Yes Take 1 tablet (25 mg total) by mouth at bedtime. Bair, Luke, MD  Active   atorvastatin  (LIPITOR) 80 MG tablet [id number] Yes Take 1 tablet (80 mg total) by mouth daily. Marylynn Verneita CROME, MD  Active   Cholecalciferol (VITAMIN D3) 25 MCG (1000 UT) CAPS [id number] Yes Take by mouth. [provider]  Active   clindamycin  (CLEOCIN -T) 1 % lotion [id number]  Apply topically daily. qd to bid to posterior neck  Patient not taking: Reported on 01/11/2024   Jackquline Sawyer, MD  Active   cyanocobalamin  (VITAMIN B12) 1000 MCG tablet [id number] Yes Take 1 tablet (1,000 mcg total) by mouth daily. Babara Call, MD  Active Self, Spouse/Significant Other, Pharmacy Records  Fluticasone -Umeclidin-Vilant (TRELEGY ELLIPTA ) 100-62.5-25 MCG/ACT AEPB [id number] Yes Inhale 1 puff into the lungs daily. Patsy Lenis, MD  Active   Fluticasone -Umeclidin-Vilant (TRELEGY  ELLIPTA) 100-62.5-25 MCG/ACT AEPB [id number]  Inhale 1 Inhalation into the lungs daily in the afternoon.  Patient not taking: Reported on 12/30/2023   Tamea Dedra CROME, MD  Active   Magnesium  200 MG TABS [id number] Yes Take 1 tablet by mouth daily in the afternoon. [provider]  Active   meloxicam (MOBIC) 15 MG tablet [id number]  Take 15 mg by mouth daily.  Patient not taking: Reported on 12/30/2023   [provider]  Active   mirtazapine (REMERON) 7.5 MG tablet [id number] Yes Take 7.5 mg by mouth at bedtime. [provider]  Active   mometasone  (ELOCON ) 0.1 % cream [id number]  Apply 1 Application topically as directed. qd to bid to aa lower legs prn itching, qd to bid aa bumps post neck prn flared, avoid face, groin, axilla  Patient not taking: Reported on 01/11/2024   Jackquline Sawyer, MD  Active   nitroGLYCERIN  (NITROSTAT ) 0.4 MG SL tablet [id number] Yes Place 1 tablet (0.4 mg total) under the tongue every 5 (five) minutes as needed for chest pain. For up to 3 doses per episode. Maribeth Camellia MATSU, MD  Active Self, Spouse/Significant Other, Pharmacy Records  valsartan  (DIOVAN ) 320 MG tablet [id number] Yes Take 320 mg by mouth daily. [provider]  Active             Recommendation:   PCP Follow-up Continue Current Plan of Care  Follow Up Plan:   Telephone follow-up 2 mos as requested. Number provided, please call if any needs prior to appointment.  Lands Devra, MSN, RN [facility], [facility] RN Care Manager Direct Dial: [phone] Fax: [phone]

## 2024-01-14 DIAGNOSIS — Z89421 Acquired absence of other right toe(s): Secondary | ICD-10-CM | POA: Diagnosis not present

## 2024-01-19 DIAGNOSIS — M48062 Spinal stenosis, lumbar region with neurogenic claudication: Secondary | ICD-10-CM | POA: Diagnosis not present

## 2024-01-19 DIAGNOSIS — M5416 Radiculopathy, lumbar region: Secondary | ICD-10-CM | POA: Diagnosis not present

## 2024-01-27 ENCOUNTER — Telehealth: Payer: Self-pay

## 2024-01-27 NOTE — Telephone Encounter (Signed)
 Patient is seeing [facility] for this and was seen on 01/19/24 by Benton Dowse, NP. Please forward this message to PMR clinic. Patient also has dementia so please make sure this message gets forwarded to PMR clinic from us . Update patient on above plan as well.   Thank you,  Luke Shade, MD

## 2024-01-27 NOTE — Telephone Encounter (Signed)
 Routed message and let the wife know the plan.

## 2024-01-27 NOTE — Telephone Encounter (Signed)
 Copied from CRM #[id number]. Topic: Clinical - Request for Lab/Test Order >> Jan 27, 2024  9:38 AM Karl Myers wrote: Reason for CRM: Patient would like to have an xray for his back due to the pain that he is going through.    [phone] (H)

## 2024-01-31 DIAGNOSIS — M1611 Unilateral primary osteoarthritis, right hip: Secondary | ICD-10-CM | POA: Diagnosis not present

## 2024-01-31 DIAGNOSIS — M48062 Spinal stenosis, lumbar region with neurogenic claudication: Secondary | ICD-10-CM | POA: Diagnosis not present

## 2024-01-31 DIAGNOSIS — M5416 Radiculopathy, lumbar region: Secondary | ICD-10-CM | POA: Diagnosis not present

## 2024-02-09 ENCOUNTER — Other Ambulatory Visit: Payer: Self-pay

## 2024-02-09 DIAGNOSIS — M6281 Muscle weakness (generalized): Secondary | ICD-10-CM | POA: Diagnosis not present

## 2024-02-09 DIAGNOSIS — R2689 Other abnormalities of gait and mobility: Secondary | ICD-10-CM | POA: Diagnosis not present

## 2024-02-09 DIAGNOSIS — M5459 Other low back pain: Secondary | ICD-10-CM | POA: Diagnosis not present

## 2024-02-09 DIAGNOSIS — E162 Hypoglycemia, unspecified: Secondary | ICD-10-CM

## 2024-02-10 DIAGNOSIS — G309 Alzheimer's disease, unspecified: Secondary | ICD-10-CM | POA: Diagnosis not present

## 2024-02-10 DIAGNOSIS — H906 Mixed conductive and sensorineural hearing loss, bilateral: Secondary | ICD-10-CM | POA: Diagnosis not present

## 2024-02-10 DIAGNOSIS — F5104 Psychophysiologic insomnia: Secondary | ICD-10-CM | POA: Diagnosis not present

## 2024-02-10 DIAGNOSIS — G319 Degenerative disease of nervous system, unspecified: Secondary | ICD-10-CM | POA: Diagnosis not present

## 2024-02-15 DIAGNOSIS — M6281 Muscle weakness (generalized): Secondary | ICD-10-CM | POA: Diagnosis not present

## 2024-02-15 DIAGNOSIS — M5459 Other low back pain: Secondary | ICD-10-CM | POA: Diagnosis not present

## 2024-02-15 DIAGNOSIS — R2689 Other abnormalities of gait and mobility: Secondary | ICD-10-CM | POA: Diagnosis not present

## 2024-02-22 DIAGNOSIS — R2689 Other abnormalities of gait and mobility: Secondary | ICD-10-CM | POA: Diagnosis not present

## 2024-02-22 DIAGNOSIS — M6281 Muscle weakness (generalized): Secondary | ICD-10-CM | POA: Diagnosis not present

## 2024-02-22 DIAGNOSIS — M5459 Other low back pain: Secondary | ICD-10-CM | POA: Diagnosis not present

## 2024-02-29 DIAGNOSIS — M6281 Muscle weakness (generalized): Secondary | ICD-10-CM | POA: Diagnosis not present

## 2024-02-29 DIAGNOSIS — M5459 Other low back pain: Secondary | ICD-10-CM | POA: Diagnosis not present

## 2024-02-29 DIAGNOSIS — R2689 Other abnormalities of gait and mobility: Secondary | ICD-10-CM | POA: Diagnosis not present

## 2024-03-12 DIAGNOSIS — R051 Acute cough: Secondary | ICD-10-CM | POA: Diagnosis not present

## 2024-03-12 DIAGNOSIS — J40 Bronchitis, not specified as acute or chronic: Secondary | ICD-10-CM | POA: Diagnosis not present

## 2024-03-12 DIAGNOSIS — Z03818 Encounter for observation for suspected exposure to other biological agents ruled out: Secondary | ICD-10-CM | POA: Diagnosis not present

## 2024-03-14 ENCOUNTER — Other Ambulatory Visit: Payer: Self-pay

## 2024-03-14 NOTE — Patient Instructions (Signed)
 Visit Information  Thank you for taking time to visit with me today. Please don't hesitate to contact me if I can be of assistance to you before our next scheduled appointment.  Your next care management appointment is by telephone on 04/18/2024 at 10:00 am  Telephone follow-up 6 Jurnei Latini as discussed  Please call the care guide team at [phone] if you need to cancel, schedule, or reschedule an appointment.   Please call the Suicide and Crisis Lifeline: 988 call the [place]  National Suicide Prevention Lifeline: 938-391-0170 or TTY: 312 205 7298 TTY [phone]) to talk to a trained counselor call 1-800-273-TALK (toll free, 24 hour hotline) go to [facility] [address] [place] [phone]) call 911 if you are experiencing a Mental Health or Behavioral Health Crisis or need someone to talk to.  Nestora Duos, MSN, RN [place]  [facility], [facility] RN Care Manager Direct Dial: [phone] Fax: [phone]   COPD Action Plan A COPD action plan is a description of what to do when you have a flare (exacerbation) of chronic obstructive pulmonary disease (COPD). Your action plan is a color-coded plan that lists the symptoms that indicate whether your condition is under control and what actions to take. If you have symptoms in the green zone, it means you are doing well that day. If you have symptoms in the yellow zone, it means you are having a bad day or an exacerbation. If you have symptoms in the red zone, you need urgent medical care. Follow the plan that you and your health care provider developed. Review your plan with your health care provider at each visit. Red zone Symptoms in this zone mean that you should get medical help right away. They include: Feeling very short of breath, even when you are resting. Not being able to do any activities because of poor breathing. Not being able to sleep because of  poor breathing. Fever or shaking chills. Feeling confused or very sleepy. Chest pain. Coughing up blood. If you have any of these symptoms, call emergency services (911 in the [place] or go to the nearest emergency room. Yellow zone Symptoms in this zone mean that your condition may be getting worse. They include: Feeling more short of breath than usual. Having less energy for daily activities than usual. Phlegm or mucus that is thicker than usual. Needing to use your rescue inhaler or nebulizer more often than usual. More ankle swelling than usual. Coughing more than usual. Feeling like you have a chest cold. Trouble sleeping due to COPD symptoms. Decreased appetite. COPD medicines not helping as much as usual. If you experience any yellow symptoms: Keep taking your daily medicines as directed. Use your quick-relief inhaler as told by your health care provider. If you were prescribed steroid medicine to take by mouth (oral medicine), start taking it as told by your health care provider. If you were prescribed an antibiotic medicine, start taking it as told by your health care provider. Do not stop taking the antibiotic even if you start to feel better. Use oxygen as told by your health care provider. Get more rest. Do your pursed-lip breathing exercises. Do not smoke. Avoid any irritants in the air. If your signs and symptoms do not improve after taking these steps, call your health care provider right away. Green zone Symptoms in this zone mean that you are doing well. They include: Being able to do your usual activities and exercise. Having the usual amount of coughing, including  the same amount of phlegm or mucus. Being able to sleep well. Having a good appetite. Where to find more information: You can find more information about COPD from: American Lung Association, My COPD Action Plan: www.lung.org COPD Foundation: www.copdfoundation.org National Heart, Lung, & Blood  Institute: [employer] Follow these instructions at home: Continue taking your daily medicines as told by your health care provider. Make sure you receive all the immunizations that your health care provider recommends, especially the pneumococcal and influenza vaccines. Wash your hands often with soap and water. Have family members wash their hands too. Regular hand washing can help prevent infections. Follow your usual exercise and diet plan. Avoid irritants in the air, such as smoke. Do not use any products that contain nicotine  or tobacco. These products include cigarettes, chewing tobacco, and vaping devices, such as e-cigarettes. If you need help quitting, ask your health care provider. Summary A COPD action plan tells you what to do when you have a flare (exacerbation) of chronic obstructive pulmonary disease (COPD). Follow each action plan for your symptoms. If you have any symptoms in the red zone, call emergency services (911 in the [place] or go to the nearest emergency room. This information is not intended to replace advice given to you by your health care provider. Make sure you discuss any questions you have with your health care provider. Document Revised: 04/08/2023 Document Reviewed: 04/08/2023 Elsevier Patient Education  2024 Elsevier Inc.   Living with COPD Being diagnosed with chronic obstructive pulmonary disease (COPD) changes your life physically and emotionally. Having COPD can affect your ability to work and do things you enjoy. COPD is not the same for everyone, and it may change over time. Your health care providers can help you come up with the COPD management plan that works best for you. How to manage lifestyle changes Treatment plan Work closely with your health care providers. Follow your COPD management plan. This plan includes: Instructions about activities, exercises, diet, medicines, what to do when COPD flares up, and when to call your health care  provider. A pulmonary rehabilitation program. In pulmonary rehab, you will learn about COPD, do exercises for fitness and breathing, and get support from health care providers and other people who have COPD. Managing emotions and stress Living with a chronic disease means you may also struggle with stressful emotions, such as sadness, fear, and worry. Here are some ways to manage these emotions: Talk to someone about your fear, anxiety, depression, or stress. Learn strategies to avoid or reduce stress and ask for help if you are struggling with depression or anxiety. Consider joining a COPD support group, online or in person.  Adjusting to changes COPD may limit the things you can do, but you can make certain changes to help you cope with the diagnosis. Ask for help when you need it. Getting support from friends, family, and your health care team is an important part of managing the condition. Try to get regular exercise as prescribed by a health care provider or pulmonary rehab team. Exercising can help COPD, even if you are a bit short of breath. Take steps to prevent infection and protect your lungs: Wash your hands often and avoid being in crowds. Stay away from friends and family members who are sick. Check your local air quality each day, and stay out of areas where air pollution is likely. How to recognize changes in your condition Recognizing changes in your COPD COPD is a progressive disease. It is important  to let the health care team know if your COPD is getting worse. Your treatment plan may need to change. Watch for: Increased shortness of breath, wheezing, cough, or fatigue. Loss of ability to exercise or perform daily activities, like climbing stairs. More frequent symptom flares. Signs of depression or anxiety. Recognizing stress It is normal to have additional stress when you have COPD. However, prolonged stress and anxiety can make COPD worse and lead to depression.  Recognize the warning signs, which include: Feeling sad or worried more often or most of the time. Having less energy and losing interest in pleasurable activities. Changes in your appetite or sleeping patterns. Being easily angered or irritated. Having unexplained aches and pains, digestive problems, or headaches. Follow these instructions at home: Eating and drinking  Eat foods that are high in fiber, such as fresh fruits and vegetables, whole grains, and beans. Limit foods that are high in fat and processed sugars, such as fried or sweet foods. Follow a balanced diet and maintain a healthy weight. Being overweight or underweight can make COPD worse. You may work with a Data processing manager as part of your pulmonary rehab program. Drink enough fluid to keep your urine pale yellow. If you drink alcohol: Limit how much you have to: 0-1 drink a day for women who are not pregnant. 0-2 drinks a day for men. Know how much alcohol is in your drink. In the [place] one drink equals one 12 oz bottle of beer (355 mL), one 5 oz glass of wine (148 mL), or one 1 oz glass of hard liquor (44 mL). Lifestyle If you smoke, the most important thing that you can do is to stop smoking. Continuing to smoke will cause the disease to progress faster. Do not use any products that contain nicotine  or tobacco. These products include cigarettes, chewing tobacco, and vaping devices, such as e-cigarettes. If you need help quitting, ask your health care provider. Avoid exposure to things that irritate your lungs, such as smoke, chemicals, and fumes. Activity Balance exercise and rest. Take short walks every 1-2 hours. This is important to improve blood flow and breathing. Ask for help if you feel weak or unsteady. Do exercises that include controlled breathing with body movement, such as tai chi. General instructions Take over-the-counter and prescription medicines only as told by your health care provider. Take vitamin and protein  supplements as told by your health care provider or dietitian. Practice good oral hygiene and see your dental care provider regularly. An oral infection can also spread to your lungs. Make sure you receive all the vaccines that your health care provider recommends. Keep all follow-up visits. This is important. Contact a health care provider if you: Are struggling to manage your COPD. Have emotional stress that interferes with your ability to cope with COPD. Get help right away if you: Have thoughts of suicide, death, or hurting yourself or others. If you ever feel like you may hurt yourself or others, or have thoughts about taking your own life, get help right away. Go to your nearest emergency department or: Call your local emergency services (911 in the [place] Call a suicide crisis helpline, such as the National Suicide Prevention Lifeline at (306) 063-1804 or 988 in the [place] This is open 24 hours a day in the [place] If you're a Veteran: Call 988 and press 1. This is open 24 hours a day. Text the [employer] at 6157551086. Summary Being diagnosed with chronic obstructive pulmonary disease (COPD) changes your life  physically and emotionally. Work with your health care providers and follow your COPD management plan. A pulmonary rehabilitation program is an important part of COPD management. Prolonged stress, anxiety, and depression can make COPD worse. Let your health care provider know if emotional stress interferes with your ability to cope with and manage COPD. This information is not intended to replace advice given to you by your health care provider. Make sure you discuss any questions you have with your health care provider. Document Revised: 01/07/2023 Document Reviewed: 06/12/2020 Elsevier Patient Education  2024 Elsevier Inc.Chronic Obstructive Pulmonary Disease  Chronic obstructive pulmonary disease (COPD) is a long-term (chronic) lung problem. When you have COPD, it can feel  harder to breathe in or out. The condition may get worse over time. There are things you can do to keep yourself as healthy as possible. What are the causes? Smoking. This is the most common cause. Breathing in fumes, smoke, or chemicals for a long time. Genes that are inherited, which means they are passed down from parent to child. What are the signs or symptoms? Shortness of breath. This may happen all the time. This may get worse when you move your body. This may get worse over time. You may have times when this becomes much worse all of a sudden. These are called flare-ups or exacerbations. A long-term cough, with or without thick mucus. Wheezing. Chest tightness. Feeling tired. Not being able to do activities like you used to do. How is this diagnosed? This condition is diagnosed based on: Your medical history. A physical exam. Lung (pulmonary) function tests. You may have a test that measures the air flow out of the lungs when you breathe out. You may also have tests, including: Chest X-ray. CT scan. Blood tests. How is this treated? This condition may be treated by: Quitting smoking, if you smoke. Using oxygen. Taking medicines. These may include: Inhalers. These have medicines in them that you breathe in. Daily inhalers. These help to prevent symptoms from happening. They are usually taken every day to prevent COPD flare-ups. Quick relief inhalers. These act fast to relieve symptoms. They are used only when needed and provide short-term relief. Other medicines that you breathe in or swallow. These may be used to open the airways, thin mucus, or treat infections. Breathing exercises to help you control or catch your breath. A mucus clearing device, if you have a lot of thick mucus. Pulmonary rehab. A place where you will learn about your condition and the best ways for you to manage it. Surgery. Follow these instructions at home: Medicines Take your medicines as told  by your health care provider. Talk to your provider before taking any cough or allergy medicines. You may need to avoid medicines that cause your lungs to be dry. Lifestyle Several times a day, wash your hands with soap and water for at least 20 seconds. If you cannot use soap and water, use hand sanitizer. This may help keep you from getting an infection. Avoid being around crowds or people who are sick. Do not smoke or use any products that contain nicotine  or tobacco. If you need help quitting, ask your provider. Stay active. Learn how to pace your activity during the day. Learn how to breathe to control your stress and catch your breath. Drink enough fluid to keep your pee (urine) pale yellow, unless you have been told not to. Eat healthy foods. Eat smaller meals more often. Get enough sleep. Most adults need 7 or more  hours per night. General instructions Make a COPD action plan with your provider. This helps you to know what to do if you feel worse than usual. Make sure you get all the shots, also called vaccines, that your provider recommends. Ask your provider about a flu shot and a pneumonia shot. If you need home oxygen therapy, ask your provider how often to check your oxygen level with a device called an oximeter. Keep all follow-up visits to review your COPD action plan. Your provider will want to check on your condition often to keep you healthy and out of the hospital. Contact a health care provider if: You are coughing up more mucus than usual. There is a change in the color or thickness of the mucus. It is harder to breathe than usual or you are short of breath while you are resting. You need to use your quick relief inhaler more often. You have trouble doing your normal activities such as getting dressed or walking in the house. Your skin color or fingernails turn blue. You have a fever or chills. Get help right away if: You are short of breath and cannot: Talk in full  sentences. Do normal activities. You have chest pain. You feel confused. These symptoms may be an emergency. Call 911 right away. Do not wait to see if the symptoms will go away. Do not drive yourself to the hospital. This information is not intended to replace advice given to you by your health care provider. Make sure you discuss any questions you have with your health care provider. Document Revised: 02/25/2023 Document Reviewed: 08/10/2022 Elsevier Patient Education  2024 [employer].

## 2024-03-14 NOTE — Patient Outreach (Signed)
 Complex Care Management   Visit Note  03/14/2024  Name:  Karl Myers MRN: [id number] DOB: [birth year]  Situation: Referral received for Complex Care Management related to COPD and Dementia I obtained verbal consent from Patient.  Visit completed with Caregiver Patient  on the phone  Background:   Past Medical History:  Diagnosis Date   Actinic keratosis    Acute hypoxic respiratory failure (HCC) 06/08/2023   Acute otitis media 05/08/2023   AKI (acute kidney injury)    Arthritis    COPD (chronic obstructive pulmonary disease) (HCC)    COVID-19 virus infection 06/08/2023   DDD (degenerative disc disease), cervical    DDD (degenerative disc disease), cervical    Dementia (HCC)    Dementia (HCC)    Depression    History of COVID-19    Hypertension    Melanoma (HCC) 09/04/2019   R 5th toe lateral tip, Stage IV, 0.16mm Breslow's   Pneumonia due to infectious organism 06/15/2023   Pre-diabetes     Assessment: Patient Reported Symptoms:  Cognitive Cognitive Status: Alert and oriented to person, place, and time, Struggling with memory recall, Normal speech and language skills (reports memory about the same) Cognitive/Intellectual Conditions Management [RPT]: None reported or documented in medical history or problem list   Health Maintenance Behaviors: Annual physical exam  Neurological Neurological Review of Symptoms: No symptoms reported Neurological Management Strategies: Routine screening Neurological Comment: dementia  HEENT HEENT Symptoms Reported: Change or loss of hearing HEENT Management Strategies: Routine screening HEENT Comment: Audiology yesterday - low frequency hearing loss bilat, [facility]    Cardiovascular Cardiovascular Symptoms Reported: Swelling in legs or feet Does patient have uncontrolled Hypertension?: No Cardiovascular Management Strategies: Medication therapy Cardiovascular Self-Management Outcome: 4  (good) Cardiovascular Comment: occasional minor swelling in feet, no sx afib  Respiratory Other Respiratory Symptoms: treated for brochitis - doxycycline  and prednisone , inhaler, still cough, not bringing anything up, nasal congestion - clear, no sx of fever, no new fatigue, discussed sx to call provider and sx for ED, discussed rest important for recovery Additional Respiratory Details: cigars daily - 8-12 small, acknoledges makes him cough more while sick, not interested in cutting back Respiratory Management Strategies: Routine screening, Medication therapy Respiratory Self-Management Outcome: 3 (uncertain)  Endocrine      Gastrointestinal Gastrointestinal Symptoms Reported: No symptoms reported Additional Gastrointestinal Details: aware to report blood in urine Gastrointestinal Comment: reports appetite good = unchanged    Genitourinary Genitourinary Symptoms Reported: No symptoms reported Additional Genitourinary Details: aware to report blood in urine to provider    Integumentary Integumentary Symptoms Reported: No symptoms reported Skin Management Strategies: Routine screening  Musculoskeletal Musculoskelatal Symptoms Reviewed: Back pain, Limited mobility Additional Musculoskeletal Details: PT - states no improvment/help, pending injections end of the month, Musculoskeletal Management Strategies: Routine screening, Medication therapy Falls in the past year?: No Number of falls in past year: 1 or less Was there an injury with Fall?: No Fall Risk Category Calculator: 0 Patient Fall Risk Level: Low Fall Risk Patient at Risk for Falls Due to: History of fall(s), Impaired mobility, Impaired balance/gait, Mental status change (near falls - grabs furniture prn, dementia, declines cane/walker, aware ED for head bumps) Fall risk Follow up: Falls evaluation completed, Falls prevention discussed  Psychosocial Psychosocial Symptoms Reported: No symptoms reported Behavioral Management  Strategies: Adequate rest, Support group        03/14/2024    PHQ2-9 Depression Screening   Little interest or pleasure in doing things  Several days  Feeling down, depressed, or hopeless Not at all  PHQ-2 - Total Score 1  Trouble falling or staying asleep, or sleeping too much    Feeling tired or having little energy    Poor appetite or overeating     Feeling bad about yourself - or that you are a failure or have let yourself or your family down    Trouble concentrating on things, such as reading the newspaper or watching television    Moving or speaking so slowly that other people could have noticed.  Or the opposite - being so fidgety or restless that you have been moving around a lot more than usual    Thoughts that you would be better off dead, or hurting yourself in some way    PHQ2-9 Total Score    If you checked off any problems, how difficult have these problems made it for you to do your work, take care of things at home, or get along with other people    Depression Interventions/Treatment      There were no vitals filed for this visit.  Medications Reviewed Today   Medications were not reviewed in this encounter     Recommendation:   PCP Follow-up Continue Current Plan of Care  Follow Up Plan:   Telephone follow-up 6 Karl Myers as discussed  Karl Duos, MSN, RN [facility]  [facility], [facility] RN Care Manager Direct Dial: [phone] Fax: [phone]

## 2024-03-15 DIAGNOSIS — M5459 Other low back pain: Secondary | ICD-10-CM | POA: Diagnosis not present

## 2024-03-15 DIAGNOSIS — R2689 Other abnormalities of gait and mobility: Secondary | ICD-10-CM | POA: Diagnosis not present

## 2024-03-15 DIAGNOSIS — M6281 Muscle weakness (generalized): Secondary | ICD-10-CM | POA: Diagnosis not present

## 2024-04-03 ENCOUNTER — Ambulatory Visit (INDEPENDENT_AMBULATORY_CARE_PROVIDER_SITE_OTHER): Payer: Medicare HMO | Admitting: *Deleted

## 2024-04-03 VITALS — Ht 67.0 in | Wt 160.0 lb

## 2024-04-03 DIAGNOSIS — Z0000 Encounter for general adult medical examination without abnormal findings: Secondary | ICD-10-CM

## 2024-04-03 NOTE — Patient Instructions (Signed)
 Karl Myers,  Thank you for taking the time for your Medicare Wellness Visit. I appreciate your continued commitment to your health goals. Please review the care plan we discussed, and feel free to reach out if I can assist you further.  Medicare recommends these wellness visits once per year to help you and your care team stay ahead of potential health issues. These visits are designed to focus on prevention, allowing your provider to concentrate on managing your acute and chronic conditions during your regular appointments.  Please note that Annual Wellness Visits do not include a physical exam. Some assessments may be limited, especially if the visit was conducted virtually. If needed, we may recommend a separate in-person follow-up with your provider.  Ongoing Care Seeing your primary care provider every 3 to 6 months helps us  monitor your health and provide consistent, personalized care.  Remember to get your flu vaccine at your pharmacy which is you prefer.   Referrals If a referral was made during today's visit and you haven't received any updates within two weeks, please contact the referred provider directly to check on the status.  Recommended Screenings:  Health Maintenance  Topic Date Due   Flu Shot  01/07/2024   Screening for Lung Cancer  08/07/2024   Medicare Annual Wellness Visit  04/03/2025   DTaP/Tdap/Td vaccine (2 - Tdap) 01/13/2026   Pneumococcal Vaccine for age over 75  Completed   Hepatitis C Screening  Completed   Zoster (Shingles) Vaccine  Completed   Meningitis B Vaccine  Aged Out   COVID-19 Vaccine  Discontinued       04/03/2024    8:29 AM  Advanced Directives  Does Patient Have a Medical Advance Directive? Yes  Type of Estate Agent of [place];Living will  Does patient want to make changes to medical advance directive? No - Patient declined  Copy of Healthcare Power of Attorney in Chart? Yes - validated most recent copy scanned in  chart (See row information)   Advance Care Planning is important because it: Ensures you receive medical care that aligns with your values, goals, and preferences. Provides guidance to your family and loved ones, reducing the emotional burden of decision-making during critical moments.  Vision: Annual vision screenings are recommended for early detection of glaucoma, cataracts, and diabetic retinopathy. These exams can also reveal signs of chronic conditions such as diabetes and high blood pressure.  Dental: Annual dental screenings help detect early signs of oral cancer, gum disease, and other conditions linked to overall health, including heart disease and diabetes.  Please see the attached documents for additional preventive care recommendations.

## 2024-04-03 NOTE — Progress Notes (Signed)
 Subjective:   Asahd Can is a 78 y.o. who presents for a Medicare Wellness preventive visit.  As a reminder, Annual Wellness Visits don't include a physical exam, and some assessments may be limited, especially if this visit is performed virtually. We may recommend an in-person follow-up visit with your provider if needed.  Visit Complete: Virtual I connected with  [name] on 04/03/24 by a audio enabled telemedicine application and verified that I am speaking with the correct person using two identifiers.  Patient Location: Home  Provider Location: Home Office  I discussed the limitations of evaluation and management by telemedicine. The patient expressed understanding and agreed to proceed.  Vital Signs: Because this visit was a virtual/telehealth visit, some criteria may be missing or patient reported. Any vitals not documented were not able to be obtained and vitals that have been documented are patient reported.  VideoDeclined- This patient declined Librarian, academic. Therefore the visit was completed with audio only.  Persons Participating in Visit: Patient assisted by wife [name].  AWV Questionnaire: No: Patient Medicare AWV questionnaire was not completed prior to this visit.  Cardiac Risk Factors include: advanced age (>78men, >97 women);male gender;dyslipidemia;hypertension;smoking/ tobacco exposure;Other (see comment), Risk factor comments: AFib     Objective:    Today's Vitals   04/03/24 0809  Weight: 160 lb (72.6 kg)  Height: 5' 7 (1.702 m)   Body mass index is 25.06 kg/m.     04/03/2024    8:29 AM 01/11/2024   10:27 AM 12/13/2023    9:25 AM 12/07/2023   11:28 AM 03/30/2023    8:37 AM 11/04/2022   10:03 AM 05/07/2022    2:40 PM  Advanced Directives  Does Patient Have a Medical Advance Directive? Yes Yes Yes Yes Yes No Yes  Type of Estate Agent of [place];Living will Healthcare Power of  [employer] Power of [employer] Power of [employer] Power of [place];Living will  Healthcare Power of [place];Living will  Does patient want to make changes to medical advance directive? No - Patient declined No - Patient declined   No - Patient declined    Copy of Healthcare Power of Attorney in Chart? Yes - validated most recent copy scanned in chart (See row information) Yes - validated most recent copy scanned in chart (See row information) Yes - validated most recent copy scanned in chart (See row information) Yes - validated most recent copy scanned in chart (See row information) Yes - validated most recent copy scanned in chart (See row information)    Would patient like information on creating a medical advance directive?      No - Patient declined     Current Medications (verified) Outpatient Encounter Medications as of 04/03/2024  Medication Sig   albuterol  (VENTOLIN  HFA) 108 (90 Base) MCG/ACT inhaler Inhale 2 puffs into the lungs every 6 (six) hours as needed for wheezing or shortness of breath.   apixaban  (ELIQUIS ) 5 MG TABS tablet TAKE ONE TABLET BY MOUTH TWICE DAILY   atenolol  (TENORMIN ) 25 MG tablet Take 1 tablet (25 mg total) by mouth at bedtime.   atorvastatin  (LIPITOR) 80 MG tablet Take 1 tablet (80 mg total) by mouth daily.   Cholecalciferol (VITAMIN D3) 25 MCG (1000 UT) CAPS Take by mouth.   cyanocobalamin  (VITAMIN B12) 1000 MCG tablet Take 1 tablet (1,000 mcg total) by mouth daily.   Fluticasone -Umeclidin-Vilant (TRELEGY ELLIPTA ) 100-62.5-25 MCG/ACT AEPB Inhale 1 puff into the lungs daily.  Magnesium  200 MG TABS Take 1 tablet by mouth daily in the afternoon. (Patient taking differently: Take 1 tablet by mouth daily in the afternoon. Patient taking 400 mg OTC daily)   mirtazapine (REMERON) 7.5 MG tablet Take 7.5 mg by mouth at bedtime. (Patient taking differently: Take 30 mg by mouth at bedtime. Wife reports taking 30 mg daily)   mometasone  (ELOCON )  0.1 % cream Apply 1 Application topically as directed. qd to bid to aa lower legs prn itching, qd to bid aa bumps post neck prn flared, avoid face, groin, axilla   nitroGLYCERIN  (NITROSTAT ) 0.4 MG SL tablet Place 1 tablet (0.4 mg total) under the tongue every 5 (five) minutes as needed for chest pain. For up to 3 doses per episode.   Probiotic Product (HEALTHY COLON PO) Take by mouth daily.   Suvorexant (BELSOMRA) 5 MG TABS Take 1 tablet by mouth daily.   valsartan  (DIOVAN ) 320 MG tablet Take 320 mg by mouth daily.   meloxicam (MOBIC) 15 MG tablet Take 15 mg by mouth daily. (Patient not taking: Reported on 04/03/2024)   [DISCONTINUED] clindamycin  (CLEOCIN -T) 1 % lotion Apply topically daily. qd to bid to posterior neck (Patient not taking: Reported on 04/03/2024)   [DISCONTINUED] doxycycline  (ADOXA) 100 MG tablet Take 100 mg by mouth 2 (two) times daily. (Patient not taking: Reported on 04/03/2024)   [DISCONTINUED] Fluticasone -Umeclidin-Vilant (TRELEGY ELLIPTA ) 100-62.5-25 MCG/ACT AEPB Inhale 1 Inhalation into the lungs daily in the afternoon. (Patient not taking: Reported on 04/03/2024)   [DISCONTINUED] predniSONE  (DELTASONE ) 20 MG tablet Take 20 mg by mouth daily with breakfast. (Patient not taking: Reported on 04/03/2024)   No facility-administered encounter medications on file as of 04/03/2024.    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, Stage IV, 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   Pneumonia due to infectious organism 06/15/2023   Pre-diabetes    Past Surgical History:  Procedure Laterality Date   BACK SURGERY     x 3   CARPAL TUNNEL RELEASE Right 08/19/2017   Procedure: CARPAL TUNNEL RELEASE;  Surgeon: Kathlynn Sharper, MD;  Location: [facility] ORS;  Service: Orthopedics;  Laterality: Right;   CATARACT EXTRACTION W/PHACO Right 11/17/2016   Procedure: CATARACT EXTRACTION PHACO AND INTRAOCULAR LENS PLACEMENT (IOC);  Surgeon: Jaye Fallow, MD;  Location: [facility] ORS;  Service: Ophthalmology;  Laterality: Right;  US  00:37 AP% 12.6 CDE 4.69 Fluid pack lot # [id number]   CATARACT EXTRACTION W/PHACO Left 12/08/2016   Procedure: CATARACT EXTRACTION PHACO AND INTRAOCULAR LENS PLACEMENT (IOC);  Surgeon: Jaye Fallow, MD;  Location: [facility] ORS;  Service: Ophthalmology;  Laterality: Left;  US  00:33 AP% 13.9 CDE 4.63 Fluid pack lot # [id number]   TOE AMPUTATION Left    ULNAR TUNNEL RELEASE Right 08/19/2017   Procedure: CUBITAL TUNNEL RELEASE;  Surgeon: Kathlynn Sharper, MD;  Location: [facility] ORS;  Service: Orthopedics;  Laterality: Right;   Family History  Problem  Relation Age of Onset   Heart attack Mother    Prostate cancer Father    Breast cancer Sister 52   Dementia Sister    Cancer Brother        unknown what kind   Social History   Socioeconomic History   Marital status: Married    Spouse name: Not on file   Number of children: Not on file   Years of education: Not on file   Highest education level: Not on file  Occupational History   Not on file  Tobacco Use   Smoking status: Every Day    Current packs/day: 0.50    Average packs/day: 0.5 packs/day for 65.0 years (32.5 ttl pk-yrs)    Types: Cigars, Cigarettes   Smokeless tobacco: Never   Tobacco comments:    0.5PPD 12/30/2022 khj   Vaping Use   Vaping status: Never Used  Substance and Sexual Activity   Alcohol use: No   Drug use: No   Sexual activity: Not  Currently  Other Topics Concern   Not on file  Social History Narrative   Married   Social Drivers of Health   Financial Resource Strain: Low Risk  (04/03/2024)   Overall Financial Resource Strain (CARDIA)    Difficulty of Paying Living Expenses: Not hard at all  Food Insecurity: No Food Insecurity (04/03/2024)   Hunger Vital Sign    Worried About Running Out of Food in the Last Year: Never true    Ran Out of Food in the Last Year: Never true  Transportation Needs: No Transportation Needs (04/03/2024)   PRAPARE - Administrator, Civil Service (Medical): No    Lack of Transportation (Non-Medical): No  Physical Activity: Inactive (04/03/2024)   Exercise Vital Sign    Days of Exercise per Week: 0 days    Minutes of Exercise per Session: 0 min  Stress: No Stress Concern Present (04/03/2024)   [employer] of Occupational Health - Occupational Stress Questionnaire    Feeling of Stress: Not at all  Social Connections: Moderately Integrated (04/03/2024)   Social Connection and Isolation Panel    Frequency of Communication with Friends and Family: More than three times a week    Frequency of Social Gatherings with Friends and Family: More than three times a week    Attends Religious Services: More than 4 times per year    Active Member of [employer] or Organizations: No    Attends Banker Meetings: Never    Marital Status: Married    Tobacco Counseling Ready to quit: No Counseling given: Not Answered Tobacco comments: 0.5PPD 12/30/2022 khj     Clinical Intake:  Pre-visit preparation completed: Yes  Pain : No/denies pain     BMI - recorded: 25.06 Nutritional Status: BMI 25 -29 Overweight Nutritional Risks: None Diabetes: No  Lab Results  Component Value Date   HGBA1C 6.2 09/11/2022   HGBA1C 6.4 01/05/2022   HGBA1C 6.1 07/07/2021     How often do you need to have someone help you when you read instructions, pamphlets, or other written  materials from your doctor or pharmacy?: 1 - Never  Interpreter Needed?: No  Information entered by :: R. Casmere Hollenbeck LPN   Activities of Daily Living     04/03/2024    8:10 AM  In your present state of health, do you have any difficulty performing the following activities:  Hearing? 1  Vision? 0  Difficulty concentrating or making decisions? 1  Walking or  climbing stairs? 1  Dressing or bathing? 0  Doing errands, shopping? 1  Preparing Food and eating ? N  Using the Toilet? N  In the past six months, have you accidently leaked urine? N  Do you have problems with loss of bowel control? N  Managing your Medications? Y  Managing your Finances? Y  Housekeeping or managing your Housekeeping? N    Patient Care Team: Bair, Kalpana, MD as PCP - General (Family Medicine) Perla Evalene PARAS, MD as PCP - Cardiology (Cardiology) Babara Call, MD as Consulting Physician (Oncology) Devra Lands, RN as [facility] Care Management Evern Allyson Hacker, FNP (Neurology) Tamea Dedra CROME, MD as Consulting Physician (Pulmonary Disease)  I have updated your Care Teams any recent Medical Services you may have received from other providers in the past year.     Assessment:   This is a routine wellness examination for [place].  Hearing/Vision screen Hearing Screening - Comments:: Needs aids and will be getting soon Vision Screening - Comments:: glasses   Goals Addressed             This Visit's Progress    Patient Stated       Wants to take medications like he is suppose to        Depression Screen     04/03/2024    8:22 AM 03/14/2024   10:24 AM 01/11/2024   10:26 AM 12/15/2023    1:33 PM 12/13/2023    9:23 AM 12/07/2023   11:25 AM 10/27/2023    2:22 PM  PHQ 2/9 Scores  PHQ - 2 Score 0 1 0 3  1 2   PHQ- 9 Score 9   12   7   Exception Documentation     Other- indicate reason in comment box    Not completed     Completed last week - reported no changes      Fall Risk     04/03/2024     8:14 AM 03/14/2024   10:20 AM 01/11/2024   10:24 AM 12/15/2023    1:33 PM 12/13/2023    9:22 AM  Fall Risk   Falls in the past year? 0 0 0 0 0  Number falls in past yr: 0 0 0 0 0  Injury with Fall? 0 0 0 0 0  Risk for fall due to : No Fall Risks History of fall(s);Impaired mobility;Impaired balance/gait;Mental status change History of fall(s);Impaired mobility No Fall Risks History of fall(s);Impaired mobility  Risk for fall due to: Comment  near falls - grabs furniture prn, dementia, declines cane/walker, aware ED for head bumps     Follow up Falls evaluation completed;Falls prevention discussed Falls evaluation completed;Falls prevention discussed Falls evaluation completed;Falls prevention discussed Falls evaluation completed Falls evaluation completed;Falls prevention discussed  Comment   Anticoagulent - aware ED for head bumps      MEDICARE RISK AT HOME:  Medicare Risk at Home Any stairs in or around the home?: No If so, are there any without handrails?: No Home free of loose throw rugs in walkways, pet beds, electrical cords, etc?: Yes Adequate lighting in your home to reduce risk of falls?: Yes Life alert?: No Use of a cane, walker or w/c?: No Grab bars in the bathroom?: Yes Shower chair or bench in shower?: Yes Elevated toilet seat or a handicapped toilet?: Yes  TIMED UP AND GO:  Was the test performed?  No  Cognitive Function: 6CIT completed    02/02/2018   10:20 AM  MMSE -  Mini Mental State Exam  Orientation to time 5  Orientation to Place 5  Registration 3  Attention/ Calculation 5  Recall 3  Language- name 2 objects 2  Language- repeat 1  Language- follow 3 step command 3  Language- read & follow direction 1  Write a sentence 1  Copy design 1  Total score 30        04/03/2024    8:30 AM 03/30/2023    8:37 AM 03/24/2022   12:48 PM 03/07/2021   10:37 AM 02/17/2019    9:53 AM  6CIT Screen  What Year? 0 points 0 points 0 points 0 points 0 points  What month? 0  points 0 points 0 points 0 points 0 points  What time? 0 points 0 points 0 points 0 points 0 points  Count back from 20 2 points 2 points 0 points 0 points 0 points  Months in reverse 4 points 2 points 0 points 0 points 0 points  Repeat phrase 2 points 2 points 2 points  0 points  Total Score 8 points 6 points 2 points  0 points    Immunizations Immunization History  Administered Date(s) Administered   Fluad Quad(high Dose 65+) 02/20/2019, 03/07/2020, 04/21/2021   Fluad Trivalent(High Dose 65+) 03/15/2023   INFLUENZA, HIGH DOSE SEASONAL PF 03/12/2017, 03/31/2018   Influenza-Unspecified 02/20/2019, 04/09/2023   PFIZER(Purple Top)SARS-COV-2 Vaccination 10/27/2019, 12/01/2019   PNEUMOCOCCAL CONJUGATE-20 09/11/2022   Td 01/14/2016   Zoster Recombinant(Shingrix) 12/03/2020, 03/07/2021, 04/07/2021    Screening Tests Health Maintenance  Topic Date Due   Influenza Vaccine  01/07/2024   Lung Cancer Screening  08/07/2024   Medicare Annual Wellness (AWV)  04/03/2025   DTaP/Tdap/Td (2 - Tdap) 01/13/2026   Pneumococcal Vaccine: 50+ Years  Completed   Hepatitis C Screening  Completed   Zoster Vaccines- Shingrix  Completed   Meningococcal B Vaccine  Aged Out   COVID-19 Vaccine  Discontinued    Health Maintenance Items Addressed: Discussed the need to update flu vaccine which he stated that he will get at his pharmacy.  Additional Screening:  Vision Screening: Recommended annual ophthalmology exams for early detection of glaucoma and other disorders of the eye. Is the patient up to date with their annual eye exam?  Yes  Who is the provider or what is the name of the office in which the patient attends annual eye exams? [facility]  Dental Screening: Recommended annual dental exams for proper oral hygiene  Community Resource Referral / Chronic Care Management: CRR required this visit?  No   CCM required this visit?  No   Plan:    I have personally reviewed and noted the  following in the patient's chart:   Medical and social history Use of alcohol, tobacco or illicit drugs  Current medications and supplements including opioid prescriptions. Patient is not currently taking opioid prescriptions. Functional ability and status Nutritional status Physical activity Advanced directives List of other physicians Hospitalizations, surgeries, and ER visits in previous 12 months Vitals Screenings to include cognitive, depression, and falls Referrals and appointments  In addition, I have reviewed and discussed with patient certain preventive protocols, quality metrics, and best practice recommendations. A written personalized care plan for preventive services as well as general preventive health recommendations were provided to patient.   Angeline Fredericks, LPN   [id number]   After Visit Summary: (Declined) Due to this being a telephonic visit, with patients personalized plan was offered to patient but patient Declined AVS at this time  Notes: Nothing significant to report at this time.

## 2024-04-18 ENCOUNTER — Telehealth: Payer: Self-pay

## 2024-04-18 ENCOUNTER — Encounter: Payer: Self-pay | Admitting: *Deleted

## 2024-04-18 NOTE — Patient Instructions (Signed)
 [name] - I am sorry we were unable to meet for your scheduled appointment. I have rescheduled your call with [place] on 05-03-24 at 10:00 am. I work with Abbey Bruckner, MD and am calling to support your healthcare needs. Please contact me at [phone] at your earliest convenience. I look forward to speaking with you soon.   Thank you,  Rosina Forte, BSN RN [facility], [facility] RN Care Manager Direct Dial: [phone]  Fax: [phone]

## 2024-04-24 ENCOUNTER — Other Ambulatory Visit: Payer: Self-pay

## 2024-04-24 NOTE — Patient Outreach (Signed)
 Complex Care Management   Visit Note  04/24/2024  Name:  Karl Myers MRN: [id number] DOB: 12/16/1945  Situation: Referral received for Complex Care Management related to COPD and Dementia I obtained verbal consent from Patient.  Visit completed with Caregiver Patient  on the phone  Background:   Past Medical History:  Diagnosis Date   Actinic keratosis    Acute hypoxic respiratory failure (HCC) 06/08/2023   Acute otitis media 05/08/2023   AKI (acute kidney injury)    Arthritis    COPD (chronic obstructive pulmonary disease) (HCC)    COVID-19 virus infection 06/08/2023   DDD (degenerative disc disease), cervical    DDD (degenerative disc disease), cervical    Dementia (HCC)    Dementia (HCC)    Depression    History of COVID-19    Hypertension    Melanoma (HCC) 09/04/2019   R 5th toe lateral tip, Stage IV, 0.59mm Breslow's   Pneumonia due to infectious organism 06/15/2023   Pre-diabetes     Assessment: Patient Reported Symptoms:  Cognitive Cognitive Status: Alert and oriented to person, place, and time, Normal speech and language skills, Struggling with memory recall (out of town for a week - no issues, no new memory concern) Cognitive/Intellectual Conditions Management [RPT]: None reported or documented in medical history or problem list   Health Maintenance Behaviors: Annual physical exam  Neurological Neurological Review of Symptoms: Weakness Neurological Comment: dementia, reports general weakness, denies need for therapy  HEENT HEENT Symptoms Reported: Change or loss of hearing HEENT Management Strategies: Routine screening    Cardiovascular Cardiovascular Symptoms Reported: Swelling in legs or feet Cardiovascular Management Strategies: Medication therapy, Routine screening Cardiovascular Comment: legs swelling at times relief with elevation, no sx a fib  Respiratory Respiratory Symptoms Reported: No symptoms reported Other Respiratory Symptoms: not needing  albuterol  inhaler, reminded to use Trellegy regularly, discussed sx to see provider/[facility] for ED pulmonary 04/27/24 - will get flu shot Additional Respiratory Details: cigars - continues to smoke - I will till I die and sleep study tonight Respiratory Management Strategies: Routine screening  Endocrine Endocrine Symptoms Reported: Not assessed Is patient diabetic?: No    Gastrointestinal Gastrointestinal Symptoms Reported: No symptoms reported      Genitourinary Genitourinary Symptoms Reported: No symptoms reported    Integumentary Integumentary Symptoms Reported: No symptoms reported Skin Management Strategies: Routine screening  Musculoskeletal Musculoskelatal Symptoms Reviewed: Back pain, Joint pain, Muscle pain Additional Musculoskeletal Details: general aches all over 2 tylenol  am and pm with some improvement, declines cane walker no falls uses furniture Musculoskeletal Management Strategies: Medication therapy Falls in the past year?: No Number of falls in past year: 1 or less Was there an injury with Fall?: No Fall Risk Category Calculator: 0 Patient Fall Risk Level: Low Fall Risk Patient at Risk for Falls Due to: Impaired mobility Fall risk Follow up: Falls evaluation completed, Falls prevention discussed  Psychosocial Psychosocial Symptoms Reported: No symptoms reported Additional Psychological Details: excellent mood Behavioral Management Strategies: Adequate rest, Support system Techniques to Cope with Loss/Stress/Change: Diversional activities      04/24/2024    PHQ2-9 Depression Screening   Little interest or pleasure in doing things Not at all  Feeling down, depressed, or hopeless Not at all  PHQ-2 - Total Score 0  Trouble falling or staying asleep, or sleeping too much    Feeling tired or having little energy    Poor appetite or overeating     Feeling bad about yourself - or that you  are a failure or have let yourself or your family down    Trouble  concentrating on things, such as reading the newspaper or watching television    Moving or speaking so slowly that other people could have noticed.  Or the opposite - being so fidgety or restless that you have been moving around a lot more than usual    Thoughts that you would be better off dead, or hurting yourself in some way    PHQ2-9 Total Score    If you checked off any problems, how difficult have these problems made it for you to do your work, take care of things at home, or get along with other people    Depression Interventions/Treatment      There were no vitals filed for this visit.    Medications Reviewed Today     Reviewed by Devra Lands, RN (Registered Nurse) on 04/24/24 at 1138  Med List Status: <None>   Medication Order Taking? Sig Documenting Provider Last Dose Status Informant  albuterol  (VENTOLIN  HFA) 108 (90 Base) MCG/ACT inhaler [id number] Yes Inhale 2 puffs into the lungs every 6 (six) hours as needed for wheezing or shortness of breath. Tamea Dedra CROME, MD  Active   apixaban  (ELIQUIS ) 5 MG TABS tablet [id number] Yes TAKE ONE TABLET BY MOUTH TWICE DAILY Gollan, Timothy J, MD  Active Self, Spouse/Significant Other, Pharmacy Records  atenolol  (TENORMIN ) 25 MG tablet [id number] Yes Take 1 tablet (25 mg total) by mouth at bedtime. Bair, Luke, MD  Active   atorvastatin  (LIPITOR) 80 MG tablet [id number] Yes Take 1 tablet (80 mg total) by mouth daily. Marylynn Verneita CROME, MD  Active   Cholecalciferol (VITAMIN D3) 25 MCG (1000 UT) CAPS [id number] Yes Take by mouth. [provider]  Active   cyanocobalamin  (VITAMIN B12) 1000 MCG tablet [id number] Yes Take 1 tablet (1,000 mcg total) by mouth daily. Babara Call, MD  Active Self, Spouse/Significant Other, Pharmacy Records  Fluticasone -Umeclidin-Vilant (TRELEGY ELLIPTA ) 100-62.5-25 MCG/ACT AEPB [id number] Yes Inhale 1 puff into the lungs daily. Patsy Lenis, MD  Active   Magnesium  200 MG TABS [id number] Yes Take 1 tablet by  mouth daily in the afternoon.  Patient taking differently: Take 1 tablet by mouth daily in the afternoon. Patient taking 400 mg OTC daily   [provider]  Active   meloxicam (MOBIC) 15 MG tablet [id number]  Take 15 mg by mouth daily.  Patient not taking: Reported on 04/03/2024   [provider]  Active   mirtazapine (REMERON) 7.5 MG tablet [id number] Yes Take 7.5 mg by mouth at bedtime.  Patient taking differently: Take 30 mg by mouth at bedtime. Wife reports taking 30 mg daily   [provider]  Active   mometasone  (ELOCON ) 0.1 % cream [id number] Yes Apply 1 Application topically as directed. qd to bid to aa lower legs prn itching, qd to bid aa bumps post neck prn flared, avoid face, groin, axilla Jackquline Sawyer, MD  Active   nitroGLYCERIN  (NITROSTAT ) 0.4 MG SL tablet [id number] Yes Place 1 tablet (0.4 mg total) under the tongue every 5 (five) minutes as needed for chest pain. For up to 3 doses per episode. Maribeth Camellia MATSU, MD  Active Self, Spouse/Significant Other, Pharmacy Records  Probiotic Product (HEALTHY COLON PO) [id number] Yes Take by mouth daily. [provider]  Active   Suvorexant [facility]) 5 MG TABS [id number] Yes Take 1 tablet by mouth daily. [provider]  Active   valsartan  (DIOVAN ) 320 MG tablet  [id number] Yes Take 320 mg by mouth daily. [provider]  Active             Recommendation:   PCP Follow-up Specialty provider follow-up Pulmonary Continue Current Plan of Care  Follow Up Plan:   Telephone follow-up 6 Jarry Manon as requested  Nestora Duos, MSN, RN [facility]  [facility], [facility] RN Care Manager Direct Dial: [phone] Fax: [phone]

## 2024-04-24 NOTE — Patient Instructions (Signed)
 Visit Information  Thank you for taking time to visit with me today. Please don't hesitate to contact me if I can be of assistance to you before our next scheduled appointment.  Your next care management appointment is by telephone on 06/13/2024 at 10:00 am  Telephone follow-up 6 Karl Myers as requested  Please call the care guide team at [phone] if you need to cancel, schedule, or reschedule an appointment.   Please call the Suicide and Crisis Lifeline: 988 call the [place]  National Suicide Prevention Lifeline: 915-431-1037 or TTY: 857-006-7333 TTY (365)096-7848) to talk to a trained counselor call 1-800-273-TALK (toll free, 24 hour hotline) go to [facility] [address], [place] [phone]) call 911 if you are experiencing a Mental Health or Behavioral Health Crisis or need someone to talk to.  Nestora Duos, MSN, RN [facility], [facility] RN Care Manager Direct Dial: [phone] Fax: [phone]

## 2024-04-25 DIAGNOSIS — G4733 Obstructive sleep apnea (adult) (pediatric): Secondary | ICD-10-CM | POA: Diagnosis not present

## 2024-04-27 ENCOUNTER — Encounter: Payer: Self-pay | Admitting: Pulmonary Disease

## 2024-04-27 ENCOUNTER — Ambulatory Visit: Admitting: Pulmonary Disease

## 2024-04-27 VITALS — BP 90/62 | HR 74 | Temp 97.6°F | Ht 67.0 in | Wt 170.0 lb

## 2024-04-27 DIAGNOSIS — I4819 Other persistent atrial fibrillation: Secondary | ICD-10-CM

## 2024-04-27 DIAGNOSIS — J449 Chronic obstructive pulmonary disease, unspecified: Secondary | ICD-10-CM

## 2024-04-27 DIAGNOSIS — F0390 Unspecified dementia without behavioral disturbance: Secondary | ICD-10-CM

## 2024-04-27 DIAGNOSIS — F17210 Nicotine dependence, cigarettes, uncomplicated: Secondary | ICD-10-CM | POA: Diagnosis not present

## 2024-04-27 DIAGNOSIS — Z23 Encounter for immunization: Secondary | ICD-10-CM

## 2024-04-27 MED ORDER — BREZTRI AEROSPHERE 160-9-4.8 MCG/ACT IN AERO: 2.0000 | INHALATION_SPRAY | Freq: Two times a day (BID) | RESPIRATORY_TRACT | Status: AC

## 2024-04-27 NOTE — Progress Notes (Signed)
 Subjective:    Patient ID: Karl Myers, male    DOB: [birth year], 78 y.o.   MRN: [id number]  Patient Care Team: Bair, Kalpana, MD as PCP - General (Family Medicine) Perla Evalene PARAS, MD as PCP - Cardiology (Cardiology) Babara Call, MD as Consulting Physician (Oncology) Devra Lands, RN as [facility] Care Management Evern Allyson Hacker, FNP (Neurology) Tamea Dedra CROME, MD as Consulting Physician (Pulmonary Disease)  Chief Complaint  Patient presents with   COPD    No cough, shortness of breath or wheezing.     BACKGROUND/INTERVAL:Patient is a 78 year old current smoker who has been followed here for the issue of moderate COPD. He was last seen on 30 December 2023. He presents today for follow-up of dry cough and shortness of breath on exertion.  He continues to smoke half a pack of cigarettes per day.   HPI Discussed the use of AI scribe software for clinical note transcription with the patient, who gave verbal consent to proceed.  History of Present Illness   Karl Myers is a 78 year old male with COPD who presents for follow-up.  He presents with his wife Karl Myers.  He is currently using Trelegy as his inhaler and wants to know if he can switch to Breztri  as he can now obtain it for free through the manufacturer of the medication. He continues to smoke, which may impact his respiratory health, he is not motivated to quit.  He mentions needing influenza vaccine during the visit.   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  Consistent with moderate obstructive defect on the basis of COPD, concomitant restrictive defect may be effort driven as the patient did have difficulty performing the test other possibilities include smoking-related  interstitial lung disease. 09/09/2021 chest LDCT: Centrilobular emphysema, linear opacity in the lingula due to scarring or atelectasis, numerous solid pulmonary nodules bilaterally largest located in right lower lobe measuring 7.2 mm, lungs RADS 3S, follow-up in 6 months recommended. Review of Systems A 10 point review of systems was performed and it is as noted above otherwise negative.   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   Low blood magnesium  10/07/2023   Insomnia 10/07/2023   Tobacco use disorder, continuous 10/07/2023   Tobacco abuse counseling 10/07/2023   Left sided lacunar stroke (HCC) 10/07/2023   Dementia (HCC) 09/14/2023   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   Adrenal nodule 08/16/2023   COPD exacerbation (HCC) 06/08/2023   Depression 06/08/2023   PAD (peripheral artery disease) 04/09/2023   Lumbar radiculopathy 03/15/2023   Fear of falling 12/09/2022   Macrocytosis 03/26/2022   Low serum vitamin B12 03/26/2022   Elevated MCV 02/03/2022   Memory difficulty 01/05/2022   Prediabetes 01/05/2022   History of melanoma 10/13/2019   Atrial fibrillation (HCC) 08/01/2019   Low TSH level 08/01/2019   COPD (chronic obstructive pulmonary disease) (HCC)    Anxiety    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   Family history of prostate cancer in father 06/17/2018   CAD (coronary artery disease) 07/27/2017   Tobacco abuse 06/24/2017   Hypertension 03/12/2017   Hyperlipidemia 03/12/2017   Fatigue 03/12/2017   Sleeping difficulty 03/12/2017   Cervical radiculopathy  03/12/2017    Social History   Tobacco Use   Smoking status: Every Day    Current packs/day: 0.50    Average packs/day: 0.5 packs/day for 65.0 years (32.5 ttl pk-yrs)    Types: Cigars, Cigarettes   Smokeless tobacco: Never   Tobacco comments:    0.5PPD 12/30/2022 khj   Substance Use Topics   Alcohol use: No    Allergies   Allergen Reactions   Hydrocodone Itching   Hydrocodone-Acetaminophen  Other (See Comments)    With large quantities hyper   Neurontin  [Gabapentin ] Other (See Comments)    With large quantities hyper     Current Meds  Medication Sig   albuterol  (VENTOLIN  HFA) 108 (90 Base) MCG/ACT inhaler Inhale 2 puffs into the lungs every 6 (six) hours as needed for wheezing or shortness of breath.   apixaban  (ELIQUIS ) 5 MG TABS tablet TAKE ONE TABLET BY MOUTH TWICE DAILY   atenolol  (TENORMIN ) 25 MG tablet Take 1 tablet (25 mg total) by mouth at bedtime.   atorvastatin  (LIPITOR) 80 MG tablet Take 1 tablet (80 mg total) by mouth daily.   budesonide-glycopyrrolate -formoterol (BREZTRI AEROSPHERE) 160-9-4.8 MCG/ACT AERO inhaler Inhale 2 puffs into the lungs in the morning and at bedtime.   Cholecalciferol (VITAMIN D3) 25 MCG (1000 UT) CAPS Take by mouth.   cyanocobalamin  (VITAMIN B12) 1000 MCG tablet Take 1 tablet (1,000 mcg total) by mouth daily.   Magnesium  200 MG TABS Take 1 tablet by mouth daily in the afternoon.   mirtazapine (REMERON) 7.5 MG tablet Take 7.5 mg by mouth at bedtime. (Patient taking differently: Take 30 mg by mouth at bedtime. Wife reports taking 30 mg daily)   nitroGLYCERIN  (NITROSTAT ) 0.4 MG SL tablet Place 1 tablet (0.4 mg total) under the tongue every 5 (five) minutes as needed for chest pain. For up to 3 doses per episode.   Probiotic Product (HEALTHY COLON PO) Take by mouth daily.   Suvorexant (BELSOMRA) 5 MG TABS Take 1 tablet by mouth daily.   valsartan  (DIOVAN ) 320 MG tablet Take 320 mg by mouth daily.   [DISCONTINUED] Fluticasone -Umeclidin-Vilant (TRELEGY ELLIPTA ) 100-62.5-25 MCG/ACT AEPB Inhale 1 puff into the lungs daily.    Immunization History  Administered Date(s) Administered   Fluad Quad(high Dose 65+) 02/20/2019, 03/07/2020, 04/21/2021   Fluad Trivalent(High Dose 65+) 03/15/2023   INFLUENZA, HIGH DOSE SEASONAL PF 03/12/2017, 03/31/2018   Influenza-Unspecified  02/20/2019, 04/09/2023   PFIZER(Purple Top)SARS-COV-2 Vaccination 10/27/2019, 12/01/2019   PNEUMOCOCCAL CONJUGATE-20 09/11/2022   Td 01/14/2016   Zoster Recombinant(Shingrix) 12/03/2020, 03/07/2021, 04/07/2021        Objective:     BP 90/62   Pulse 74   Temp 97.6 F (36.4 C) (Temporal)   Ht 5' 7 (1.702 m)   Wt 170 lb (77.1 kg)   SpO2 96%   BMI 26.63 kg/m   SpO2: 96 %  GENERAL: Well, well-nourished gentleman, no acute distress, fully ambulatory.  No conversational dyspnea. HEAD: Normocephalic, atraumatic.  EYES: Pupils equal, round, reactive to light.  No scleral icterus.  MOUTH: Dentures uppers and lowers, oral mucosa moist.  No thrush. NECK: Supple. No thyromegaly. Trachea midline. No JVD.  No adenopathy. PULMONARY: Good air entry bilaterally.  Coarse, otherwise, no adventitious sounds. CARDIOVASCULAR: S1 and S2.  Irregular rate and rhythm with controlled ventricular response.  No rubs, murmurs or gallops heard. ABDOMEN: Benign. MUSCULOSKELETAL: No joint deformity, no clubbing, no edema.  NEUROLOGIC: No focal deficit, no gait disturbance, speech is fluent. SKIN: Intact,warm,dry. PSYCH: Impaired short-term memory.  Assessment & Plan:     ICD-10-CM   1. Stage 2 moderate COPD by GOLD classification (HCC)  J44.9     2. Persistent atrial fibrillation (HCC)  I48.19     3. Tobacco dependence due to cigarettes  F17.210     4. Dementia, unspecified dementia severity, unspecified dementia type, unspecified whether behavioral, psychotic, or mood disturbance or anxiety (HCC)  F03.90     5. Need for immunization against influenza  Z23       Meds ordered this encounter  Medications   budesonide-glycopyrrolate -formoterol (BREZTRI AEROSPHERE) 160-9-4.8 MCG/ACT AERO inhaler    Sig: Inhale 2 puffs into the lungs in the morning and at bedtime.   Assessment and Plan    Chronic obstructive pulmonary disease (COPD) Transition to Breztri inhaler is planned due to  insurance coverage issues, as Breztri is similar in composition with three medications. - Switched from Trelegy to Breztri inhaler, two puffs twice a day - Instructed to rinse mouth after using Breztri - Monitor for any issues with Breztri and report if problems arise  Tobacco use Continues to smoke, which is a risk factor for COPD exacerbations and overall health.  Influenza vaccination Due for influenza vaccination. - Administered influenza vaccination      Smoking/Tobacco Cessation Counseling Aleksi Brummet is a current user of tobacco or nicotine  products. He is not ready to quit at this time. Counseling provided today addressed the risks of continued use and the benefits of cessation. Discussed tobacco/nicotine  use history, readiness to quit, and evidence-based treatment options including behavioral strategies, support resources, and pharmacologic therapies. Provided encouragement and educational materials on steps and resources to quit smoking. Patient questions were addressed, and follow-up recommended for continued support. Total time spent on counseling: 5 minutes.     Advised if symptoms do not improve or worsen, to please contact office for sooner follow up or seek emergency care.    I spent 30 minutes of dedicated to the care of this patient on the date of this encounter to include pre-visit review of records, face-to-face time with the patient discussing conditions above, post visit ordering of testing, clinical documentation with the electronic health record, making appropriate referrals as documented, and communicating necessary findings to members of the patients care team.     C. Leita Sanders, MD Advanced Bronchoscopy PCCM [facility]    *This note was generated using voice recognition software/Dragon and/or AI transcription program.  Despite best efforts to proofread, errors can occur which can change the meaning. Any transcriptional errors that  result from this process are unintentional and may not be fully corrected at the time of dictation.

## 2024-04-27 NOTE — Patient Instructions (Signed)
 VISIT SUMMARY:  Karl Myers, you came in today for a follow-up visit regarding your COPD. We discussed your current inhaler medication, your smoking habits, and the need for a flu shot.  YOUR PLAN:  -CHRONIC OBSTRUCTIVE PULMONARY DISEASE (COPD): COPD is a chronic lung disease that makes it hard to breathe. We are switching your inhaler from Trelegy to Breztri due to insurance coverage. Please take two puffs of Breztri twice a day and remember to rinse your mouth after each use. Monitor for any issues and let us  know if you experience any problems.  -TOBACCO USE: Continuing to smoke can worsen your COPD and overall health. It is important to consider quitting smoking to improve your respiratory health.  -INFLUENZA VACCINATION: You were due for a flu shot, which helps protect you from the influenza virus. The vaccination was administered during your visit.  INSTRUCTIONS:  Please follow up if you experience any issues with the new inhaler. Continue to monitor your respiratory health and consider smoking cessation options. Your next routine check-up should be scheduled in 6 months.

## 2024-05-03 ENCOUNTER — Telehealth: Payer: Self-pay

## 2024-05-10 DIAGNOSIS — G4700 Insomnia, unspecified: Secondary | ICD-10-CM | POA: Diagnosis not present

## 2024-05-10 DIAGNOSIS — G4733 Obstructive sleep apnea (adult) (pediatric): Secondary | ICD-10-CM | POA: Diagnosis not present

## 2024-05-10 DIAGNOSIS — H906 Mixed conductive and sensorineural hearing loss, bilateral: Secondary | ICD-10-CM | POA: Diagnosis not present

## 2024-05-10 DIAGNOSIS — G309 Alzheimer's disease, unspecified: Secondary | ICD-10-CM | POA: Diagnosis not present

## 2024-05-11 ENCOUNTER — Inpatient Hospital Stay: Payer: Medicare HMO | Admitting: Oncology

## 2024-05-11 ENCOUNTER — Encounter: Payer: Self-pay | Admitting: Oncology

## 2024-05-11 ENCOUNTER — Inpatient Hospital Stay: Payer: Medicare HMO

## 2024-05-11 VITALS — BP 127/75 | HR 65 | Temp 96.8°F | Resp 18 | Wt 170.9 lb

## 2024-05-11 DIAGNOSIS — D7589 Other specified diseases of blood and blood-forming organs: Secondary | ICD-10-CM | POA: Diagnosis not present

## 2024-05-11 DIAGNOSIS — Z8042 Family history of malignant neoplasm of prostate: Secondary | ICD-10-CM | POA: Insufficient documentation

## 2024-05-11 DIAGNOSIS — E538 Deficiency of other specified B group vitamins: Secondary | ICD-10-CM

## 2024-05-11 DIAGNOSIS — F17210 Nicotine dependence, cigarettes, uncomplicated: Secondary | ICD-10-CM | POA: Insufficient documentation

## 2024-05-11 DIAGNOSIS — Z803 Family history of malignant neoplasm of breast: Secondary | ICD-10-CM | POA: Diagnosis not present

## 2024-05-11 LAB — CBC WITH DIFFERENTIAL (CANCER CENTER ONLY)
Abs Immature Granulocytes: 0.07 K/uL (ref 0.00–0.07)
Basophils Absolute: 0.1 K/uL (ref 0.0–0.1)
Basophils Relative: 1 %
Eosinophils Absolute: 0.9 K/uL — ABNORMAL HIGH (ref 0.0–0.5)
Eosinophils Relative: 9 %
HCT: 48.6 % (ref 39.0–52.0)
Hemoglobin: 16.4 g/dL (ref 13.0–17.0)
Immature Granulocytes: 1 %
Lymphocytes Relative: 27 %
Lymphs Abs: 2.8 K/uL (ref 0.7–4.0)
MCH: 34.3 pg — ABNORMAL HIGH (ref 26.0–34.0)
MCHC: 33.7 g/dL (ref 30.0–36.0)
MCV: 101.7 fL — ABNORMAL HIGH (ref 80.0–100.0)
Monocytes Absolute: 1 K/uL (ref 0.1–1.0)
Monocytes Relative: 9 %
Neutro Abs: 5.7 K/uL (ref 1.7–7.7)
Neutrophils Relative %: 53 %
Platelet Count: 190 K/uL (ref 150–400)
RBC: 4.78 MIL/uL (ref 4.22–5.81)
RDW: 14.5 % (ref 11.5–15.5)
WBC Count: 10.6 K/uL — ABNORMAL HIGH (ref 4.0–10.5)
nRBC: 0 % (ref 0.0–0.2)

## 2024-05-11 LAB — VITAMIN B12: Vitamin B-12: 1064 pg/mL — ABNORMAL HIGH (ref 180–914)

## 2024-05-11 LAB — FOLATE: Folate: 16.7 ng/mL (ref >5.9)

## 2024-05-11 NOTE — Assessment & Plan Note (Signed)
 Chronic macrocytosis.  Stable.  No anemia. Previous workup negative for hemolysis, normal multiple myeloma workup Patient is asymptomatic and macrocytosis is very mild.   Hold off bone marrow biopsy Recommend observation.

## 2024-05-11 NOTE — Progress Notes (Signed)
 Hematology/Oncology Progress note [phone] [phone]         Patient Care Team: Bair, Kalpana, MD as PCP - General (Family Medicine) Perla Evalene PARAS, MD as PCP - Cardiology (Cardiology) Babara Call, MD as Consulting Physician (Oncology) Devra Lands, RN as [facility] Care Management Evern Allyson Hacker, FNP (Neurology) Tamea Dedra CROME, MD as Consulting Physician (Pulmonary Disease)   REFERRING PROVIDER: Maribeth Camellia MATSU, MD  Microcytosis  ASSESSMENT & PLAN:  Macrocytosis Chronic macrocytosis.  Stable.  No anemia. Previous workup negative for hemolysis, normal multiple myeloma workup Patient is asymptomatic and macrocytosis is very mild.   Hold off bone marrow biopsy Recommend observation.   Low serum vitamin B12 B12 is elevated Recommend patient to decrease Vitamin B12 1000mcg 2-3 times per week.  Orders Placed This Encounter  Procedures   CBC with Differential (Cancer Center Only)    Standing Status:   Future    Expected Date:   05/11/2025    Expiration Date:   08/09/2025   CMP (Cancer Center only)    Standing Status:   Future    Expected Date:   05/11/2025    Expiration Date:   08/09/2025   Vitamin B12    Standing Status:   Future    Expected Date:   05/11/2025    Expiration Date:   08/09/2025   Folate    Standing Status:   Future    Expected Date:   05/11/2025    Expiration Date:   08/09/2025   Follow up in 1 year  all questions were answered. The patient knows to call the clinic with any problems, questions or concerns.  Call Babara, MD, PhD [facility] Hematology Oncology 05/11/2024     HISTORY OF PRESENTING ILLNESS:  Karl Myers is a  78 y.o.  male with PMH listed below who was referred to me for elevated MCV Reviewed patient's recent labs that was done.  He was found to have abnormal CBC on 03/19/22 with normal hemoglobin 15.2, MCV 102.4 Reviewed patient's previous labs ordered by primary care physician's office,  anemia is chronic onset , duration is since 2022 He had not noticed any recent bleeding such as epistaxis, hematuria or hematochezia.  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  Denies alcohol use.  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 male who has above history reviewed by me today presents for follow up visit for macrocytosis.  Patient takes vitamin B12 supplementation daily.SABRA  He has no new complaints. He reports being diagnosed with Dementia.  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's   Pneumonia due to infectious organism 06/15/2023   Pre-diabetes     SURGICAL HISTORY: Past Surgical History:  Procedure Laterality Date   BACK SURGERY     x 3   CARPAL TUNNEL RELEASE Right 08/19/2017    Procedure: CARPAL TUNNEL RELEASE;  Surgeon: Kathlynn Sharper, MD;  Location: [facility] ORS;  Service: Orthopedics;  Laterality: Right;   CATARACT EXTRACTION W/PHACO Right 11/17/2016   Procedure: CATARACT EXTRACTION PHACO AND INTRAOCULAR LENS PLACEMENT (IOC);  Surgeon: Jaye Fallow, MD;  Location: [facility] ORS;  Service: Ophthalmology;  Laterality: Right;  US  00:37 AP% 12.6 CDE 4.69 Fluid pack lot # [id number]   CATARACT EXTRACTION W/PHACO Left 12/08/2016   Procedure: CATARACT EXTRACTION PHACO AND INTRAOCULAR LENS PLACEMENT (IOC);  Surgeon: Jaye Fallow, MD;  Location: [facility] OR[facility];  Service: Ophthalmology;  Laterality: Left;  US  00:33 AP% 13.9 CDE 4.63 Fluid pack lot # [id number]   TOE AMPUTATION Left    ULNAR TUNNEL RELEASE Right 08/19/2017   Procedure: CUBITAL TUNNEL RELEASE;  Surgeon: Kathlynn Sharper, MD;  Location: [facility] OR[facility];  Service: Orthopedics;  Laterality: Right;    SOCIAL HISTORY: Social History   Socioeconomic History   Marital status: Married    Spouse name: Not on file   Number of children: Not on file   Years of education: Not on file   Highest education level: Not on file  Occupational History   Not on file  Tobacco Use   Smoking status: Every Day    Current packs/day: 0.50    Average packs/day: 0.5 packs/day for 65.0 years (32.5 ttl pk-yrs)    Types: Cigars, Cigarettes   Smokeless tobacco: Never   Tobacco comments:    0.5PPD 12/30/2022 khj   Vaping Use   Vaping status: Never Used  Substance and Sexual Activity   Alcohol use: No   Drug use: No   Sexual activity: Not Currently  Other Topics Concern   Not on file  Social History Narrative   Married   Social Drivers of Health   Financial Resource Strain: Low Risk  (04/03/2024)   Overall Financial Resource Strain (CARDIA)    Difficulty of Paying Living Expenses: Not hard at all  Food Insecurity: No Food Insecurity (04/03/2024)   Hunger Vital Sign    Worried About Running Out of Food in the Last Year: Never true     Ran Out of Food in the Last Year: Never true  Transportation Needs: No Transportation Needs (04/03/2024)   PRAPARE - Administrator, Civil Service (Medical): No    Lack of Transportation (Non-Medical): No  Physical Activity: Inactive (04/03/2024)   Exercise Vital Sign    Days of Exercise per Week: 0 days    Minutes of Exercise per Session: 0 min  Stress: No Stress Concern Present (04/03/2024)   [employer] of Occupational Health - Occupational Stress Questionnaire    Feeling of Stress: Not at all  Social Connections: Moderately Integrated (04/03/2024)   Social Connection and Isolation Panel    Frequency of Communication with Friends and Family: More than three times a week    Frequency of  Social Gatherings with Friends and Family: More than three times a week    Attends Religious Services: More than 4 times per year    Active Member of [employer] or Organizations: No    Attends Banker Meetings: Never    Marital Status: Married  Catering Manager Violence: Not At Risk (04/03/2024)   Humiliation, Afraid, Rape, and Kick questionnaire    Fear of Current or Ex-Partner: No    Emotionally Abused: No    Physically Abused: No    Sexually Abused: No    FAMILY HISTORY: Family History  Problem Relation Age of Onset   Heart attack Mother    Prostate cancer Father    Breast cancer Sister 12   Dementia Sister    Cancer Brother        unknown what kind    ALLERGIES:  is allergic to hydrocodone, hydrocodone-acetaminophen , and neurontin  [gabapentin ].  MEDICATIONS:  Current Outpatient Medications  Medication Sig Dispense Refill   albuterol  (VENTOLIN  HFA) 108 (90 Base) MCG/ACT inhaler Inhale 2 puffs into the lungs every 6 (six) hours as needed for wheezing or shortness of breath. 8 g 2   apixaban  (ELIQUIS ) 5 MG TABS tablet TAKE ONE TABLET BY MOUTH TWICE DAILY 180 tablet 1   atenolol  (TENORMIN ) 25 MG tablet Take 1 tablet (25 mg total) by mouth at bedtime. 90 tablet 1    atorvastatin  (LIPITOR) 80 MG tablet Take 1 tablet (80 mg total) by mouth daily. 90 tablet 3   budesonide-glycopyrrolate -formoterol (BREZTRI  AEROSPHERE) 160-9-4.8 MCG/ACT AERO inhaler Inhale 2 puffs into the lungs in the morning and at bedtime.     Cholecalciferol (VITAMIN D3) 25 MCG (1000 UT) CAPS Take by mouth.     cyanocobalamin  (VITAMIN B12) 1000 MCG tablet Take 1 tablet (1,000 mcg total) by mouth daily. 90 tablet 1   Magnesium  200 MG TABS Take 1 tablet by mouth daily in the afternoon.     mirtazapine (REMERON) 7.5 MG tablet Take 7.5 mg by mouth at bedtime. (Patient taking differently: Take 30 mg by mouth at bedtime. Wife reports taking 30 mg daily)     nitroGLYCERIN  (NITROSTAT ) 0.4 MG SL tablet Place 1 tablet (0.4 mg total) under the tongue every 5 (five) minutes as needed for chest pain. For up to 3 doses per episode. 50 tablet 0   Probiotic Product (HEALTHY COLON PO) Take by mouth daily.     Suvorexant (BELSOMRA) 5 MG TABS Take 1 tablet by mouth daily.     valsartan  (DIOVAN ) 320 MG tablet Take 320 mg by mouth daily.     mometasone  (ELOCON ) 0.1 % cream Apply 1 Application topically as directed. qd to bid to aa lower legs prn itching, qd to bid aa bumps post neck prn flared, avoid face, groin, axilla (Patient not taking: Reported on 05/11/2024) 45 g 1   No current facility-administered medications for this visit.    Review of Systems  Constitutional:  Negative for appetite change, chills, fatigue, fever and unexpected weight change.  HENT:   Negative for hearing loss and voice change.   Eyes:  Negative for eye problems and icterus.  Respiratory:  Negative for chest tightness, cough and shortness of breath.   Cardiovascular:  Negative for chest pain and leg swelling.  Gastrointestinal:  Negative for abdominal distention and abdominal pain.  Endocrine: Negative for hot flashes.  Genitourinary:  Negative for difficulty urinating, dysuria and frequency.   Musculoskeletal:  Negative for  arthralgias.       History of  melanoma s/p right 5th toe amputation.   Skin:  Negative for itching and rash.  Neurological:  Negative for light-headedness and numbness.  Hematological:  Negative for adenopathy. Does not bruise/bleed easily.  Psychiatric/Behavioral:  Negative for confusion.     PHYSICAL EXAMINATION: ECOG PERFORMANCE STATUS: 1 - Symptomatic but completely ambulatory Vitals:   05/11/24 0958  BP: 127/75  Pulse: 65  Resp: 18  Temp: (!) 96.8 F (36 C)  SpO2: 97%   Filed Weights   05/11/24 0958  Weight: 170 lb 14.4 oz (77.5 kg)    Physical Exam Constitutional:      General: He is not in acute distress. HENT:     Head: Normocephalic and atraumatic.  Eyes:     General: No scleral icterus. Cardiovascular:     Rate and Rhythm: Normal rate and regular rhythm.     Heart sounds: Normal heart sounds.  Pulmonary:     Effort: Pulmonary effort is normal. No respiratory distress.     Breath sounds: No wheezing.  Abdominal:     General: Bowel sounds are normal. There is no distension.     Palpations: Abdomen is soft.  Musculoskeletal:        General: No deformity. Normal range of motion.     Cervical back: Normal range of motion and neck supple.  Skin:    General: Skin is warm and dry.     Findings: No erythema or rash.  Neurological:     Mental Status: He is alert and oriented to person, place, and time. Mental status is at baseline.     Cranial Nerves: No cranial nerve deficit.     Coordination: Coordination normal.  Psychiatric:        Mood and Affect: Mood normal.      LABORATORY DATA:  I have reviewed the data as listed    Latest Ref Rng & Units 05/11/2024    9:18 AM 12/15/2023    1:35 PM 06/09/2023    4:28 AM  CBC  WBC 4.0 - 10.5 K/uL 10.6  12.4  7.1   Hemoglobin 13.0 - 17.0 g/dL 83.5  83.9  86.7   Hematocrit 39.0 - 52.0 % 48.6  48.2  38.5   Platelets 150 - 400 K/uL 190  188.0  126       Latest Ref Rng & Units 10/14/2023    9:50 AM 08/16/2023   10:38  AM 06/09/2023    4:28 AM  CMP  Glucose 70 - 99 mg/dL 66  878  862   BUN 6 - 23 mg/dL 15  16  21    Creatinine 0.40 - 1.50 mg/dL 8.78  8.92  9.08   Sodium 135 - 145 mEq/L 138  140  135   Potassium 3.5 - 5.1 mEq/L 4.4  4.2  4.3   Chloride 96 - 112 mEq/L 102  103  101   CO2 19 - 32 mEq/L 30  30  23    Calcium  8.4 - 10.5 mg/dL 9.3  8.9  8.2       Component Value Date/Time   FERRITIN 469 (H) 07/26/2019 0330     RADIOGRAPHIC STUDIES: I have personally reviewed the radiological images as listed and agreed with the findings in the report. No results found.

## 2024-05-11 NOTE — Assessment & Plan Note (Signed)
 B12 is elevated Recommend patient to decrease Vitamin B12 1000mcg 2-3 times per week.

## 2024-05-15 ENCOUNTER — Ambulatory Visit: Admitting: Dermatology

## 2024-05-15 DIAGNOSIS — B351 Tinea unguium: Secondary | ICD-10-CM

## 2024-05-15 DIAGNOSIS — L820 Inflamed seborrheic keratosis: Secondary | ICD-10-CM | POA: Diagnosis not present

## 2024-05-15 DIAGNOSIS — L821 Other seborrheic keratosis: Secondary | ICD-10-CM | POA: Diagnosis not present

## 2024-05-15 DIAGNOSIS — W908XXA Exposure to other nonionizing radiation, initial encounter: Secondary | ICD-10-CM

## 2024-05-15 DIAGNOSIS — L570 Actinic keratosis: Secondary | ICD-10-CM

## 2024-05-15 DIAGNOSIS — D692 Other nonthrombocytopenic purpura: Secondary | ICD-10-CM | POA: Diagnosis not present

## 2024-05-15 DIAGNOSIS — Z1283 Encounter for screening for malignant neoplasm of skin: Secondary | ICD-10-CM

## 2024-05-15 DIAGNOSIS — L739 Follicular disorder, unspecified: Secondary | ICD-10-CM

## 2024-05-15 DIAGNOSIS — I872 Venous insufficiency (chronic) (peripheral): Secondary | ICD-10-CM

## 2024-05-15 DIAGNOSIS — D1801 Hemangioma of skin and subcutaneous tissue: Secondary | ICD-10-CM

## 2024-05-15 DIAGNOSIS — B353 Tinea pedis: Secondary | ICD-10-CM

## 2024-05-15 DIAGNOSIS — L814 Other melanin hyperpigmentation: Secondary | ICD-10-CM

## 2024-05-15 DIAGNOSIS — L578 Other skin changes due to chronic exposure to nonionizing radiation: Secondary | ICD-10-CM | POA: Diagnosis not present

## 2024-05-15 DIAGNOSIS — D229 Melanocytic nevi, unspecified: Secondary | ICD-10-CM

## 2024-05-15 MED ORDER — CICLOPIROX OLAMINE 0.77 % EX CREA: TOPICAL_CREAM | CUTANEOUS | 11 refills | Status: AC

## 2024-05-15 NOTE — Patient Instructions (Addendum)
 Actinic keratoses are precancerous spots that appear secondary to cumulative UV radiation exposure/sun exposure over time. They are chronic with expected duration over 1 year. A portion of actinic keratoses will progress to squamous cell carcinoma of the skin. It is not possible to reliably predict which spots will progress to skin cancer and so treatment is recommended to prevent development of skin cancer.  Recommend daily broad spectrum sunscreen SPF 30+ to sun-exposed areas, reapply every 2 hours as needed.  Recommend staying in the shade or wearing long sleeves, sun glasses (UVA+UVB protection) and wide brim hats (4-inch brim around the entire circumference of the hat). Call for new or changing lesions.   Cryotherapy Aftercare  Wash gently with soap and water everyday.   Apply Vaseline and Band-Aid daily until healed.   Seborrheic Keratosis  What causes seborrheic keratoses? Seborrheic keratoses are harmless, common skin growths that first appear during adult life.  As time goes by, more growths appear.  Some people may develop a large number of them.  Seborrheic keratoses appear on both covered and uncovered body parts.  They are not caused by sunlight.  The tendency to develop seborrheic keratoses can be inherited.  They vary in color from skin-colored to gray, brown, or even black.  They can be either smooth or have a rough, warty surface.   Seborrheic keratoses are superficial and look as if they were stuck on the skin.  Under the microscope this type of keratosis looks like layers upon layers of skin.  That is why at times the top layer may seem to fall off, but the rest of the growth remains and re-grows.    Treatment Seborrheic keratoses do not need to be treated, but can easily be removed in the office.  Seborrheic keratoses often cause symptoms when they rub on clothing or jewelry.  Lesions can be in the way of shaving.  If they become inflamed, they can cause itching, soreness, or  burning.  Removal of a seborrheic keratosis can be accomplished by freezing, burning, or surgery. If any spot bleeds, scabs, or grows rapidly, please return to have it checked, as these can be an indication of a skin cancer.    Gentle Skin Care Guide  1. Bathe no more than once a day.  2. Avoid bathing in hot water  3. Use a mild soap like Dove, Vanicream, Cetaphil, CeraVe. Can use Lever 2000 or Cetaphil antibacterial soap  4. Use soap only where you need it. On most days, use it under your arms, between your legs, and on your feet. Let the water rinse other areas unless visibly dirty.  5. When you get out of the bath/shower, use a towel to gently blot your skin dry, don't rub it.  6. While your skin is still a little damp, apply a moisturizing cream such as Vanicream, CeraVe, Cetaphil, Eucerin, Sarna lotion or plain Vaseline Jelly. For hands apply Neutrogena Norwegian Hand Cream or Excipial Hand Cream.  7. Reapply moisturizer any time you start to itch or feel dry.  8. Sometimes using free and clear laundry detergents can be helpful. Fabric softener sheets should be avoided. Downy Free & Gentle liquid, or any liquid fabric softener that is free of dyes and perfumes, it acceptable to use  9. If your doctor has given you prescription creams you may apply moisturizers over them       Melanoma ABCDEs  Melanoma is the most dangerous type of skin cancer, and is the leading cause of  death from skin disease.  You are more likely to develop melanoma if you: Have light-colored skin, light-colored eyes, or red or blond hair Spend a lot of time in the sun Tan regularly, either outdoors or in a tanning bed Have had blistering sunburns, especially during childhood Have a close family member who has had a melanoma Have atypical moles or large birthmarks  Early detection of melanoma is key since treatment is typically straightforward and cure rates are extremely high if we catch it early.    The first sign of melanoma is often a change in a mole or a new dark spot.  The ABCDE system is a way of remembering the signs of melanoma.  A for asymmetry:  The two halves do not match. B for border:  The edges of the growth are irregular. C for color:  A mixture of colors are present instead of an even brown color. D for diameter:  Melanomas are usually (but not always) greater than 6mm - the size of a pencil eraser. E for evolution:  The spot keeps changing in size, shape, and color.  Please check your skin once per month between visits. You can use a small mirror in front and a large mirror behind you to keep an eye on the back side or your body.   If you see any new or changing lesions before your next follow-up, please call to schedule a visit.  Please continue daily skin protection including broad spectrum sunscreen SPF 30+ to sun-exposed areas, reapplying every 2 hours as needed when you're outdoors.   Staying in the shade or wearing long sleeves, sun glasses (UVA+UVB protection) and wide brim hats (4-inch brim around the entire circumference of the hat) are also recommended for sun protection.    Due to recent changes in healthcare laws, you may see results of your pathology and/or laboratory studies on MyChart before the doctors have had a chance to review them. We understand that in some cases there may be results that are confusing or concerning to you. Please understand that not all results are received at the same time and often the doctors may need to interpret multiple results in order to provide you with the best plan of care or course of treatment. Therefore, we ask that you please give us  2 business days to thoroughly review all your results before contacting the office for clarification. Should we see a critical lab result, you will be contacted sooner.   If You Need Anything After Your Visit  If you have any questions or concerns for your doctor, please call our main  line at [phone] and press option 4 to reach your doctor's medical assistant. If no one answers, please leave a voicemail as directed and we will return your call as soon as possible. Messages left after 4 pm will be answered the following business day.   You may also send us  a message via MyChart. We typically respond to MyChart messages within 1-2 business days.  For prescription refills, please ask your pharmacy to contact our office. Our fax number is [phone].  If you have an urgent issue when the clinic is closed that cannot wait until the next business day, you can page your doctor at the number below.    Please note that while we do our best to be available for urgent issues outside of office hours, we are not available 24/7.   If you have an urgent issue and are unable to reach  us , you may choose to seek medical care at your doctor's office, retail clinic, urgent care center, or emergency room.  If you have a medical emergency, please immediately call 911 or go to the emergency department.  Pager Numbers  - Dr. Hester: [phone]  - Dr. Jackquline: [phone]  - Dr. Claudene: [phone]   - Dr. Raymund: [phone]  In the event of inclement weather, please call our main line at [phone] for an update on the status of any delays or closures.  Dermatology Medication Tips: Please keep the boxes that topical medications come in in order to help keep track of the instructions about where and how to use these. Pharmacies typically print the medication instructions only on the boxes and not directly on the medication tubes.   If your medication is too expensive, please contact our office at [phone] option 4 or send us  a message through MyChart.   We are unable to tell what your co-pay for medications will be in advance as this is different depending on your insurance coverage. However, we may be able to find a substitute medication at lower cost or fill out  paperwork to get insurance to cover a needed medication.   If a prior authorization is required to get your medication covered by your insurance company, please allow us  1-2 business days to complete this process.  Drug prices often vary depending on where the prescription is filled and some pharmacies may offer cheaper prices.  The website www.goodrx.com contains coupons for medications through different pharmacies. The prices here do not account for what the cost may be with help from insurance (it may be cheaper with your insurance), but the website can give you the price if you did not use any insurance.  - You can print the associated coupon and take it with your prescription to the pharmacy.  - You may also stop by our office during regular business hours and pick up a GoodRx coupon card.  - If you need your prescription sent electronically to a different pharmacy, notify our office through [facility] or by phone at [phone] option 4.     Si Usted Necesita Algo Despus de Su Visita  Tambin puede enviarnos un mensaje a travs de Clinical Cytogeneticist. Por lo general respondemos a los mensajes de MyChart en el transcurso de 1 a 2 das hbiles.  Para renovar recetas, por favor pida a su farmacia que se ponga en contacto con nuestra oficina. Randi lakes de fax es [place] [phone].  Si tiene un asunto urgente cuando la clnica est cerrada y que no puede esperar hasta el siguiente da hbil, puede llamar/localizar a su doctor(a) al nmero que aparece a continuacin.   Por favor, tenga en cuenta que aunque hacemos todo lo posible para estar disponibles para asuntos urgentes fuera del horario de [place], no estamos disponibles las 24 horas del da, los 7 [address]  [phone] de la [place].   Si tiene un problema urgente y no puede comunicarse con nosotros, puede optar por buscar atencin mdica  en el consultorio de su doctor(a), en una clnica privada, en un centro de atencin urgente o en una sala de  emergencias.  Si tiene engineer, drilling, por favor llame inmediatamente al 911 o vaya a la sala de emergencias.  Nmeros de bper  - Dr. Hester: [phone]  - Dra. Jackquline: [phone]  - Dr. Claudene: [phone]  - Dra. Kitts: [phone]  En caso de inclemencias del tiempo, por favor llame a nuestra lnea principal  al [phone] para una actualizacin sobre el [place] de cualquier retraso o cierre.  Consejos para la medicacin en dermatologa: Por favor, guarde las cajas en las que vienen los medicamentos de uso tpico para ayudarle a seguir las instrucciones sobre dnde y cmo usarlos. Las farmacias generalmente imprimen las instrucciones del medicamento slo en las cajas y no directamente en los tubos del [place].   Si su medicamento es muy caro, por favor, pngase en contacto con landry rieger llamando al [phone] y presione la opcin 4 o envenos un mensaje a travs de Clinical Cytogeneticist.   No podemos decirle cul ser su copago por los medicamentos por adelantado ya que esto es diferente dependiendo de la cobertura de su seguro. Sin embargo, es posible que podamos encontrar un medicamento sustituto a audiological scientist un formulario para que el seguro cubra el medicamento que se considera necesario.   Si se requiere una autorizacin previa para que su compaa de seguros cubra su medicamento, por favor permtanos de 1 a 2 das hbiles para completar este proceso.  Los precios de los medicamentos varan con frecuencia dependiendo del environmental consultant de dnde se surte la receta y alguna farmacias pueden ofrecer precios ms baratos.  El sitio web www.goodrx.com tiene cupones para medicamentos de health and safety inspector. Los precios aqu no tienen en cuenta lo que podra costar con la ayuda del seguro (puede ser ms barato con su seguro), pero el sitio web puede darle el precio si no utiliz tourist information centre manager.  - Puede imprimir el cupn correspondiente y llevarlo con su receta a la  farmacia.  - Tambin puede pasar por nuestra oficina durante el horario de atencin regular y education officer, museum una tarjeta de cupones de GoodRx.  - Si necesita que su receta se enve electrnicamente a una farmacia diferente, informe a nuestra oficina a travs de MyChart de [place] o por telfono llamando al [phone] y presione la opcin 4.

## 2024-05-15 NOTE — Progress Notes (Signed)
 Follow-Up Visit   Subjective  Karl Myers is a 78 y.o. male who presents for the following: Skin Cancer Screening and Full Body Skin Exam Hx of melanoma, hx of aks   Spots at abdomen that are bothersome, scaly spots on face   The patient presents for Total-Body Skin Exam (TBSE) for skin cancer screening and mole check. The patient has spots, moles and lesions to be evaluated, some may be new or changing and the patient may have concern these could be cancer.    The following portions of the chart were reviewed this encounter and updated as appropriate: medications, allergies, medical history  Review of Systems:  No other skin or systemic complaints except as noted in HPI or Assessment and Plan.  Objective  Well appearing patient in no apparent distress; mood and affect are within normal limits.  A full examination was performed including scalp, head, eyes, ears, nose, lips, neck, chest, axillae, abdomen, back, buttocks, bilateral upper extremities, bilateral lower extremities, hands, feet, fingers, toes, fingernails, and toenails. All findings within normal limits unless otherwise noted below.   Relevant physical exam findings are noted in the Assessment and Plan.  right upper temple x 1, right forehead x 3, frontal scalp x 3, right nasal tip x 1, right nasal root x 1 (9) Erythematous thin papules/macules with gritty scale.  right anterior flank x 1 (2) Erythematous stuck-on, waxy papule  Assessment & Plan   SKIN CANCER SCREENING PERFORMED TODAY.  ACTINIC DAMAGE - Chronic condition, secondary to cumulative UV/sun exposure - diffuse scaly erythematous macules with underlying dyspigmentation - Recommend daily broad spectrum sunscreen SPF 30+ to sun-exposed areas, reapply every 2 hours as needed.  - Staying in the shade or wearing long sleeves, sun glasses (UVA+UVB protection) and wide brim hats (4-inch brim around the entire circumference of the hat) are also recommended for  sun protection.  - Call for new or changing lesions.  LENTIGINES, SEBORRHEIC KERATOSES, HEMANGIOMAS - Benign normal skin lesions - Benign-appearing - Call for any changes  MELANOCYTIC NEVI - Tan-brown and/or pink-flesh-colored symmetric macules and papules - Benign appearing on exam today - Observation - Call clinic for new or changing moles - Recommend daily use of broad spectrum spf 30+ sunscreen to sun-exposed areas.     SEBORRHEIC KERATOSIS R pretibia Exam: 0.6cm speckled tan waxy macule R pretibia   Treatment: Benign-appearing. Stable compared to previous visit. Observation.  Call clinic for new or changing moles.  Recommend daily use of broad spectrum spf 30+ sunscreen to sun-exposed areas.      STASIS DERMATITIS Exam: erythema with rust colored tiny macules at lower legs lower legs with trace pitting edema  Chronic and persistent condition with duration or expected duration over one year. Condition is symptomatic/ bothersome to patient. Not currently at goal.     Stasis in the legs causes chronic leg swelling, which may result in itchy or painful rashes, skin discoloration, skin texture changes, and sometimes ulceration.  Recommend daily graduated compression hose/stockings- easiest to put on first thing in morning, remove at bedtime.  Elevate legs as much as possible. Avoid salt/sodium rich foods.   Treatment Plan: Continue as needed when flared  Mometasone  cream qd to bid to lower legs prn itching, rash, avoid f/g/a   Topical steroids (such as triamcinolone , fluocinolone, fluocinonide, mometasone , clobetasol, halobetasol, betamethasone , hydrocortisone) can cause thinning and lightening of the skin if they are used for too long in the same area. Your physician has selected the right strength  medicine for your problem and area affected on the body. Please use your medication only as directed by your physician to prevent side effects.    FOLLICULITIS Posterior neck Exam:  post neck clear   Chronic condition with duration or expected duration over one year. Currently well-controlled.    Folliculitis occurs due to inflammation of the superficial hair follicle (pore), resulting in acne-like lesions (pus bumps). It can be infectious (bacterial, fungal) or noninfectious (shaving, tight clothing, heat/sweat, medications).  Folliculitis can be acute or chronic and recommended treatment depends on the underlying cause of folliculitis.   Treatment Plan: Continue Clindamycin  lotion qd to bid Continue Mometasone  cr qd/bid prn flares, avoid f/g/a   Topical steroids (such as triamcinolone , fluocinolone, fluocinonide, mometasone , clobetasol, halobetasol, betamethasone , hydrocortisone) can cause thinning and lightening of the skin if they are used for too long in the same area. Your physician has selected the right strength medicine for your problem and area affected on the body. Please use your medication only as directed by your physician to prevent side effects.    ONYCHOMYCOSIS with Tinea Pedis  Exam: Thickened toenails with subungal debris c/w onychomycosis And scaling and maceration web spaces and over distal and lateral soles.  Chronic and persistent condition with duration or expected duration over one year. Condition is symptomatic/ bothersome to patient. Not currently at goal.   Treatment Plan: Start ciclopirox  cream 0.77 % apply topically to feet and toes including nails at bedtime Discussed will not clear nails but will keep infection from spreading to skin    Purpura - Chronic; persistent and recurrent.  Treatable, but not curable. 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  - Call if any new or changing lesions are noted between office visits    ACTINIC KERATOSIS (9) right upper temple x 1, right forehead x 3, frontal scalp x 3, right nasal tip x 1, right nasal root x 1 (9) Actinic keratoses are precancerous spots that appear secondary to cumulative UV radiation exposure/sun exposure over time. They are chronic with expected duration over 1 year. A portion of actinic keratoses will progress to squamous cell carcinoma of the skin. It is not possible to reliably predict which spots will progress to skin cancer and so treatment is recommended to prevent development of skin cancer.  Recommend daily broad spectrum sunscreen SPF 30+ to sun-exposed areas, reapply every 2 hours as needed.  Recommend staying in the shade or wearing long sleeves, sun glasses (UVA+UVB protection) and wide brim hats (4-inch brim around the entire circumference of the hat). Call for new or changing lesions. Destruction of lesion - right upper temple x 1, right forehead x 3, frontal scalp x 3, right nasal tip x 1, right nasal root x 1 (9)  Destruction method: cryotherapy   Informed consent: discussed and consent obtained   Lesion destroyed using liquid nitrogen: Yes  Region frozen until ice ball extended beyond lesion: Yes   Outcome: patient tolerated procedure well with no complications   Post-procedure details: wound care instructions given   Additional details:  Prior to procedure, discussed risks of blister formation, small wound, skin dyspigmentation, or rare scar following cryotherapy. Recommend Vaseline ointment to treated areas while healing.    INFLAMED SEBORRHEIC KERATOSIS (2) right anterior flank x 1 (2) Symptomatic, irritating, patient would like treated. Destruction of lesion - right anterior flank x 1 (2)  Destruction method: cryotherapy   Informed consent: discussed and consent obtained   Lesion destroyed using liquid nitrogen: Yes   Region frozen until ice ball extended beyond lesion: Yes   Outcome: patient tolerated procedure well with no complications   Post-procedure details: wound care instructions given   Additional details:  Prior to procedure, discussed risks of blister formation, small wound, skin dyspigmentation, or rare scar following cryotherapy. Recommend Vaseline ointment to treated areas while healing.   TINEA UNGUIUM   Related Medications ciclopirox  (LOPROX ) 0.77 % cream Apply topically both feet, toes and toenails nightly for fungus Return in about 6 months (around 11/13/2024) for TBSE.  I, Eleanor Blush, CMA, am acting as scribe for Rexene Rattler, MD.   Documentation: I have reviewed the above documentation for accuracy and completeness, and I agree with the above.  Rexene Rattler, MD

## 2024-05-19 ENCOUNTER — Ambulatory Visit

## 2024-05-19 VITALS — BP 122/88 | HR 61 | Temp 98.0°F | Ht 67.0 in | Wt 170.2 lb

## 2024-05-19 DIAGNOSIS — E785 Hyperlipidemia, unspecified: Secondary | ICD-10-CM | POA: Diagnosis not present

## 2024-05-19 DIAGNOSIS — F17209 Nicotine dependence, unspecified, with unspecified nicotine-induced disorders: Secondary | ICD-10-CM | POA: Diagnosis not present

## 2024-05-19 DIAGNOSIS — J449 Chronic obstructive pulmonary disease, unspecified: Secondary | ICD-10-CM

## 2024-05-19 DIAGNOSIS — G309 Alzheimer's disease, unspecified: Secondary | ICD-10-CM

## 2024-05-19 DIAGNOSIS — F5101 Primary insomnia: Secondary | ICD-10-CM | POA: Diagnosis not present

## 2024-05-19 DIAGNOSIS — E782 Mixed hyperlipidemia: Secondary | ICD-10-CM | POA: Diagnosis not present

## 2024-05-19 DIAGNOSIS — G4733 Obstructive sleep apnea (adult) (pediatric): Secondary | ICD-10-CM | POA: Insufficient documentation

## 2024-05-19 DIAGNOSIS — F02B0 Dementia in other diseases classified elsewhere, moderate, without behavioral disturbance, psychotic disturbance, mood disturbance, and anxiety: Secondary | ICD-10-CM | POA: Diagnosis not present

## 2024-05-19 DIAGNOSIS — I10 Essential (primary) hypertension: Secondary | ICD-10-CM | POA: Diagnosis not present

## 2024-05-19 LAB — COMPREHENSIVE METABOLIC PANEL WITH GFR
ALT: 14 U/L (ref 0–53)
AST: 20 U/L (ref 0–37)
Albumin: 4.3 g/dL (ref 3.5–5.2)
Alkaline Phosphatase: 70 U/L (ref 39–117)
BUN: 21 mg/dL (ref 6–23)
CO2: 32 meq/L (ref 19–32)
Calcium: 9.3 mg/dL (ref 8.4–10.5)
Chloride: 102 meq/L (ref 96–112)
Creatinine, Ser: 1.13 mg/dL (ref 0.40–1.50)
GFR: 62.16 mL/min (ref >60.00)
Glucose, Bld: 79 mg/dL (ref 70–99)
Potassium: 5 meq/L (ref 3.5–5.1)
Sodium: 140 meq/L (ref 135–145)
Total Bilirubin: 0.8 mg/dL (ref 0.2–1.2)
Total Protein: 6.3 g/dL (ref 6.0–8.3)

## 2024-05-19 LAB — LIPID PANEL
Cholesterol: 115 mg/dL (ref 0–200)
HDL: 39.2 mg/dL (ref >39.00)
LDL Cholesterol: 48 mg/dL (ref 0–99)
NonHDL: 75.49
Total CHOL/HDL Ratio: 3
Triglycerides: 136 mg/dL (ref 0.0–149.0)
VLDL: 27.2 mg/dL (ref 0.0–40.0)

## 2024-05-19 MED ORDER — ATENOLOL 25 MG PO TABS: 25.0000 mg | ORAL_TABLET | Freq: Every day | ORAL | 3 refills | Status: AC

## 2024-05-19 MED ORDER — ATORVASTATIN CALCIUM 80 MG PO TABS: 80.0000 mg | ORAL_TABLET | Freq: Every day | ORAL | 3 refills | Status: AC

## 2024-05-19 NOTE — Progress Notes (Signed)
 Established Patient Office Visit    Subjective  Patient ID: Karl Myers, male    DOB: 1945/07/05  Age: 78 y.o. MRN: [id number]  Chief Complaint  Patient presents with   COPD   Hypertension    Discussed the use of AI scribe software for clinical note transcription with the patient, who gave verbal consent to proceed.  History of Present Illness Karl Myers is a 78 year old male who presents for follow up on chronic conditions.   He has been experiencing improved sleep, achieving about eight hours per night, with the use of Belsomra 5 mg and mirtazapine 30 mg. He has a history of severe sleep apnea and is awaiting a CPAP machine.   He has not yet received his hearing aids, which are to be obtained through the [facility], with an appointment scheduled for January 8th. He believes that improving his hearing could help with memory and cognitive function.  He continues to smoke about five to eight cigarettes per day, a habit maintained for many years. He uses albuterol  as needed and Trelegy BID for maintainance for COPD. He is not interested in smoking cessation.  He also continues to take Eliquis , atenolol  at bedtime, Lipitor, and vitamin D . He was advised to take B12 two to three times a week due to previously high levels.  No recent chest pain and has not needed to use nitroglycerin . He reports no falls since the last visit and feels he is doing better overall. He mentions a recent visit to the dermatologist and a nurse practitioner for neurology, with a follow-up scheduled in four months.      ROS As per HPI    Objective:     BP 122/88 (BP Location: Right Arm, Patient Position: Sitting, Cuff Size: Normal)   Pulse 61   Temp 98 F (36.7 C) (Oral)   Ht 5' 7 (1.702 m)   Wt 170 lb 3.2 oz (77.2 kg)   SpO2 94%   BMI 26.66 kg/m      05/19/2024   11:12 AM 04/24/2024   11:47 AM 04/03/2024    8:22 AM  Depression screen PHQ 2/9  Decreased Interest 0 0 0  Down,  Depressed, Hopeless 0 0 0  PHQ - 2 Score 0 0 0  Altered sleeping 0  2  Tired, decreased energy 1  3  Change in appetite 0  0  Feeling bad or failure about yourself  0  0  Trouble concentrating 0  1  Moving slowly or fidgety/restless 0  3  Suicidal thoughts 0  0  PHQ-9 Score 1  9   Difficult doing work/chores Not difficult at all  Somewhat difficult     Data saved with a previous flowsheet row definition      05/19/2024   11:12 AM 12/15/2023    1:34 PM 12/07/2023   11:26 AM 10/14/2023    9:16 AM  GAD 7 : Generalized Anxiety Score  Nervous, Anxious, on Edge 0 1 1 1   Control/stop worrying 0 1 0 1  Worry too much - different things 0 0 0 1  Trouble relaxing 0 1 1 1   Restless 0 0 0 1  Easily annoyed or irritable 0 1 1 1   Afraid - awful might happen 0 0 0 0  Total GAD 7 Score 0 4 3 6   Anxiety Difficulty Not difficult at all Somewhat difficult Somewhat difficult Somewhat difficult      05/19/2024   11:12 AM 04/24/2024  11:47 AM 04/03/2024    8:22 AM  Depression screen PHQ 2/9  Decreased Interest 0 0 0  Down, Depressed, Hopeless 0 0 0  PHQ - 2 Score 0 0 0  Altered sleeping 0  2  Tired, decreased energy 1  3  Change in appetite 0  0  Feeling bad or failure about yourself  0  0  Trouble concentrating 0  1  Moving slowly or fidgety/restless 0  3  Suicidal thoughts 0  0  PHQ-9 Score 1  9   Difficult doing work/chores Not difficult at all  Somewhat difficult     Data saved with a previous flowsheet row definition      05/19/2024   11:12 AM 12/15/2023    1:34 PM 12/07/2023   11:26 AM 10/14/2023    9:16 AM  GAD 7 : Generalized Anxiety Score  Nervous, Anxious, on Edge 0 1 1 1   Control/stop worrying 0 1 0 1  Worry too much - different things 0 0 0 1  Trouble relaxing 0 1 1 1   Restless 0 0 0 1  Easily annoyed or irritable 0 1 1 1   Afraid - awful might happen 0 0 0 0  Total GAD 7 Score 0 4 3 6   Anxiety Difficulty Not difficult at all Somewhat difficult Somewhat difficult Somewhat  difficult   SDOH Screenings   Food Insecurity: No Food Insecurity (04/03/2024)  Housing: Unknown (04/03/2024)  Transportation Needs: No Transportation Needs (04/03/2024)  Utilities: Not At Risk (04/03/2024)  Alcohol Screen: Low Risk (04/03/2024)  Depression (PHQ2-9): Low Risk (05/19/2024)  Recent Concern: Depression (PHQ2-9) - Medium Risk (04/03/2024)  Financial Resource Strain: Low Risk (04/03/2024)  Physical Activity: Inactive (04/03/2024)  Social Connections: Moderately Integrated (04/03/2024)  Stress: No Stress Concern Present (04/03/2024)  Tobacco Use: High Risk (05/19/2024)  Health Literacy: Inadequate Health Literacy (04/03/2024)     Physical Exam Constitutional:      General: He is not in acute distress.    Appearance: Normal appearance.  HENT:     Head: Normocephalic and atraumatic.     Right Ear: Tympanic membrane normal.     Left Ear: Tympanic membrane normal.     Mouth/Throat:     Mouth: Mucous membranes are moist.  Neck:     Thyroid : No thyroid  mass or thyroid  tenderness.  Cardiovascular:     Rate and Rhythm: Normal rate and regular rhythm.  Pulmonary:     Effort: Pulmonary effort is normal.     Breath sounds: Normal breath sounds.  Abdominal:     General: Bowel sounds are normal.     Palpations: Abdomen is soft.  Musculoskeletal:     Cervical back: Neck supple. No rigidity.     Right lower leg: No edema.     Left lower leg: No edema.  Skin:    General: Skin is warm.  Neurological:     Mental Status: He is alert and oriented to person, place, and time.  Psychiatric:        Mood and Affect: Mood normal.        Behavior: Behavior normal.        No results found for any visits on 05/19/24.  The ASCVD Risk score (Arnett DK, et al., 2019) failed to calculate for the following reasons:   Risk score cannot be calculated because patient has a medical history suggesting prior/existing ASCVD   * - Cholesterol units were assumed     Assessment &  Plan:   Assessment &  Plan Mixed hyperlipidemia On Atorvastatin  80 mg, daily, continue. Refill sent. Repeat Lipid panel and CMP.  Orders:   Comp Met (CMET)   atorvastatin  (LIPITOR) 80 MG tablet; Take 1 tablet (80 mg total) by mouth daily.   Lipid panel   Tobacco use disorder, continuous Brief smoking cessation counseling provided. Patient is not interested in smoking cessation. He had low dose CT of lungs in 03/2023 for lung cancer screening. Referral made to repeat.  Orders:   Ambulatory Referral for Lung Cancer Scre  Primary hypertension BP within goal, continue Atenolol  25 mg daily and Valsartan  320 mg daily. Refill on Atenolol  done.  Orders:   atenolol  (TENORMIN ) 25 MG tablet; Take 1 tablet (25 mg total) by mouth at bedtime.  OSA (obstructive sleep apnea) Waiting for CPAP to be delivered. Plan per insomnia.     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  Discussed importance of wearing hearing aids, staying active, healthy diet.  Encouraged routine and brain-stimulating activities. Attend hearing aid appointment on January 8th at [facility].       Primary insomnia Improved with current treatment with Mirtazapine 30 mg daily and Belsomra from neurology. Continue current medications and follow up with neurology. Waiting on CPAP to be delivered, encouraged adherence with CPAP and benefits of treatment of OSA.      Chronic obstructive pulmonary disease, unspecified COPD type (HCC) Continue albuterol  inhaler as needed. Continue Trelegy BID.       Return in about 6 months (around 11/17/2024) for Chronic follow up .   Luke Shade, MD

## 2024-05-19 NOTE — Assessment & Plan Note (Addendum)
 Continue follow up with neurology at [facility].  Discussed importance of wearing hearing aids, staying active, healthy diet.  Encouraged routine and brain-stimulating activities. Attend hearing aid appointment on January 8th at [facility].

## 2024-05-19 NOTE — Assessment & Plan Note (Addendum)
 Brief smoking cessation counseling provided. Patient is not interested in smoking cessation. He had low dose CT of lungs in 03/2023 for lung cancer screening. Referral made to repeat.  Orders:   Ambulatory Referral for Lung Cancer Scre

## 2024-05-19 NOTE — Assessment & Plan Note (Addendum)
 On Atorvastatin  80 mg, daily, continue. Refill sent. Repeat Lipid panel and CMP.  Orders:   Comp Met (CMET)   atorvastatin  (LIPITOR) 80 MG tablet; Take 1 tablet (80 mg total) by mouth daily.   Lipid panel

## 2024-05-19 NOTE — Assessment & Plan Note (Signed)
 Continue albuterol  inhaler as needed. Continue Trelegy BID.

## 2024-05-19 NOTE — Assessment & Plan Note (Addendum)
 Waiting for CPAP to be delivered. Plan per insomnia.

## 2024-05-19 NOTE — Patient Instructions (Signed)
 Follow up with Dr. Abbey in 6 months.

## 2024-05-19 NOTE — Assessment & Plan Note (Addendum)
 Improved with current treatment with Mirtazapine 30 mg daily and Belsomra from neurology. Continue current medications and follow up with neurology. Waiting on CPAP to be delivered, encouraged adherence with CPAP and benefits of treatment of OSA.

## 2024-05-19 NOTE — Assessment & Plan Note (Addendum)
 BP within goal, continue Atenolol  25 mg daily and Valsartan  320 mg daily. Refill on Atenolol  done.  Orders:   atenolol  (TENORMIN ) 25 MG tablet; Take 1 tablet (25 mg total) by mouth at bedtime.

## 2024-05-21 ENCOUNTER — Ambulatory Visit: Payer: Self-pay

## 2024-05-21 NOTE — Progress Notes (Signed)
 Please call the patient to let him know his kidney, liver function and cholesterol all looks great.   Thank you,  Luke Shade, MD

## 2024-06-13 ENCOUNTER — Other Ambulatory Visit: Payer: Self-pay

## 2024-06-13 NOTE — Patient Instructions (Signed)
 Visit Information  Thank you for taking time to visit with me today. Please don't hesitate to contact me if I can be of assistance to you before our next scheduled appointment.  Your next care management appointment is by telephone on 07/25/2024 at 10:00 am  Telephone follow-up 6 Yenni Carra as preferred  Please call the care guide team at [phone] if you need to cancel, schedule, or reschedule an appointment.   Please call the Suicide and Crisis Lifeline: 988 call the [place]  National Suicide Prevention Lifeline: 660-738-0746 or TTY: (361)592-3284 TTY (320)086-8189) to talk to a trained counselor call 1-800-273-TALK (toll free, 24 hour hotline) go to [facility] [address] [place] [phone]) call 911 if you are experiencing a Mental Health or Behavioral Health Crisis or need someone to talk to.  Nestora Duos, MSN, RN [place]  [facility], [facility] RN Care Manager Direct Dial: [phone] Fax: [phone]   Understanding Your Risk for Falls Millions of people have serious injuries from falls each year. It is important to understand your risk of falling. Talk with your health care provider about your risk and what you can do to lower it. If you do have a serious fall, make sure to tell your provider. Falling once raises your risk of falling again. How can falls affect me? Serious injuries from falls are common. These include: Broken bones, such as hip fractures. Head injuries, such as traumatic brain injuries (TBI) or concussions. A fear of falling can cause you to avoid activities and stay at home. This can make your muscles weaker and raise your risk for a fall. What can increase my risk? There are a number of risk factors that increase your risk for falling. The more risk factors you have, the higher your risk of falling. Serious injuries from a fall happen most often to people who are older than 79 years  old. Teenagers and young adults ages 56-29 are also at higher risk. Common risk factors include: Weakness in the lower body. Being generally weak or confused due to long-term (chronic) illness. Dizziness or balance problems. Poor vision. Medicines that cause dizziness or drowsiness. These may include: Medicines for your blood pressure, heart, anxiety, insomnia, or swelling (edema). Pain medicines. Muscle relaxants. Other risk factors include: Drinking alcohol. Having had a fall in the past. Having foot pain or wearing improper footwear. Working at a dangerous job. Having any of the following in your home: Tripping hazards, such as floor clutter or loose rugs. Poor lighting. Pets. Having dementia or memory loss. What actions can I take to lower my risk of falling?     Physical activity Stay physically fit. Do strength and balance exercises. Consider taking a regular class to build strength and balance. Yoga and tai chi are good options. Vision Have your eyes checked every year and your prescription for glasses or contacts updated as needed. Shoes and walking aids Wear non-skid shoes. Wear shoes that have rubber soles and low heels. Do not wear high heels. Do not walk around the house in socks or slippers. Use a cane or walker as told by your provider. Home safety Attach secure railings on both sides of your stairs. Install grab bars for your bathtub, shower, and toilet. Use a non-skid mat in your bathtub or shower. Attach bath mats securely with double-sided, non-slip rug tape. Use good lighting in all rooms. Keep a flashlight near your bed. Make sure there is a clear path from your bed to  the bathroom. Use night-lights. Do not use throw rugs. Make sure all carpeting is taped or tacked down securely. Remove all clutter from walkways and stairways, including extension cords. Repair uneven or broken steps and floors. Avoid walking on icy or slippery surfaces. Walk on the grass  instead of on icy or slick sidewalks. Use ice melter to get rid of ice on walkways in the winter. Use a cordless phone. Questions to ask your health care provider Can you help me check my risk for a fall? Do any of my medicines make me more likely to fall? Should I take a vitamin D  supplement? What exercises can I do to improve my strength and balance? Should I make an appointment to have my vision checked? Do I need a bone density test to check for weak bones (osteoporosis)? Would it help to use a cane or a walker? Where to find more information Centers for Disease Control and Prevention, STEADI: tonerpromos.no Community-Based Fall Prevention Programs: tonerpromos.no [employer] on Aging: baseringtones.pl Contact a health care provider if: You fall at home. You are afraid of falling at home. You feel weak, drowsy, or dizzy. This information is not intended to replace advice given to you by your health care provider. Make sure you discuss any questions you have with your health care provider. Document Revised: 01/26/2022 Document Reviewed: 01/26/2022 Elsevier Patient Education  2024 [employer].

## 2024-06-13 NOTE — Patient Outreach (Signed)
 Complex Care Management   Visit Note  06/13/2024  Name:  Karl Myers MRN: [id number] DOB: Apr 05, 1946  Situation: Referral received for Complex Care Management related to COPD and Dementia I obtained verbal consent from Patient.  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   Pneumonia due to infectious organism 06/15/2023   Pre-diabetes     Assessment: Patient Reported Symptoms:  Cognitive Cognitive Status: Struggling with memory recall (dementia - wife reports memory same) Cognitive/Intellectual Conditions Management [RPT]: None reported or documented in medical history or problem list   Health Maintenance Behaviors: Annual physical exam Health Facilitated by: Pain control, Rest, Healthy diet  Neurological Neurological Review of Symptoms: Weakness Neurological Management Strategies: Routine screening Neurological Comment: Dementia - unchanged, legs very weak, half steps instead of whole steps per wife, no cane or walker - furntiture for support, no falls, positive for near falls, declined cane at this time, discussed fall precautions and need for ED if hits head, no interest in Physical therapy for strengthening, discussed chair exercises and to keep doing PT exercises from before  HEENT HEENT Symptoms Reported: Change or loss of hearing HEENT Management Strategies: Routine screening HEENT Comment: hearing aides from  [facility] - pending need hearing test with them, scheduled this week along with rehab clinic for needs at home    Cardiovascular Cardiovascular Symptoms Reported: No symptoms reported Does patient have uncontrolled Hypertension?: No Cardiovascular Management Strategies: Medication therapy, Routine screening Cardiovascular Comment: no nitro use, taking meds regularly and no swelling in legs  Respiratory Respiratory Symptoms Reported: Wheezing Other Respiratory Symptoms: not using Breztri  regularly, wife reports wheezing at times, discussed need/reason for Breztri  for lung health and preventing hospitalization, appointment today for new CPAP mask, unable to tolerate current Additional Respiratory Details: cigars - not wanting to quit Respiratory Management Strategies: Routine screening, Medication therapy, CPAP  Endocrine Endocrine Symptoms Reported: Not assessed Is patient diabetic?: No Endocrine Comment: continues with healthy diet  Gastrointestinal Gastrointestinal Symptoms Reported: No symptoms reported, Vomiting Additional Gastrointestinal Details: aware to report blood in stool Gastrointestinal Management Strategies: Medication therapy    Genitourinary Genitourinary Symptoms Reported: No symptoms reported Additional Genitourinary Details: aware to report urination to provider    Integumentary Integumentary Symptoms Reported: No symptoms reported Skin Management Strategies: Routine screening  Musculoskeletal Musculoskelatal Symptoms Reviewed: Back pain, Joint pain, Muscle pain, Weakness Additional Musculoskeletal Details: weakness in legs, small steps, tyelnol for general aches and pains Musculoskeletal Management Strategies: Medication therapy   Patient at Risk for Falls Due to: History of fall(s), Impaired balance/gait Fall risk Follow up: Falls evaluation completed, Education provided  Psychosocial Psychosocial Symptoms Reported: No symptoms reported Additional Psychological Details: no  mood issues - did well on recent vacation, no irritability or increase in memory issues, has routines at home including feeding/caring for chickens daily Behavioral Management Strategies: Support system, Adequate rest  Behavioral Health Comment: meds helped a lot with sleep - issues since CPAP - appointment today for new mask Techniques to Cope with Loss/Stress/Change: Diversional activities      06/13/2024    PHQ2-9 Depression Screening   Little interest or pleasure in doing things Not at all  Feeling down, depressed, or hopeless Not at all  PHQ-2 - Total Score 0  Trouble falling or staying asleep, or sleeping too much    Feeling tired or having little energy    Poor appetite or overeating     Feeling bad about yourself - or that you are a failure or have let yourself or your family down    Trouble concentrating on things, such as reading the newspaper or watching television    Moving or speaking so slowly that other people could have noticed.  Or the opposite - being so fidgety or restless that you have been moving around a lot more than usual    Thoughts that you would be better off dead, or hurting yourself in some way    PHQ2-9 Total Score    If you checked off any problems, how difficult have these problems made it for you to do your work, take care of things at home, or get along with other people    Depression Interventions/Treatment      There were no vitals filed for this visit.    Medications Reviewed Today     Reviewed by Devra Lands, RN (Registered Nurse) on 06/13/24 at 1011  Med List Status: <None>   Medication Order Taking? Sig Documenting Provider Last Dose Status Informant  albuterol  (VENTOLIN  HFA) 108 (90 Base) MCG/ACT inhaler [id number] Yes Inhale 2 puffs into the lungs every 6 (six) hours as needed for wheezing or shortness of breath. Tamea Dedra CROME, MD  Active   apixaban  (ELIQUIS ) 5 MG TABS tablet [id number] Yes TAKE ONE TABLET BY MOUTH TWICE DAILY Gollan,  Timothy J, MD  Active Self, Spouse/Significant Other, Pharmacy Records  atenolol  (TENORMIN ) 25 MG tablet [id number] Yes Take 1 tablet (25 mg total) by mouth at bedtime. Bair, Luke, MD  Active   atorvastatin  (LIPITOR) 80 MG tablet [id number] Yes Take 1 tablet (80 mg total) by mouth daily. Bair, Kalpana, MD  Active   budesonide-glycopyrrolate -formoterol (BREZTRI  AEROSPHERE) 160-9-4.8 MCG/ACT AERO inhaler [id number] Yes Inhale 2 puffs into the lungs in the morning and at bedtime.  Patient taking differently: Inhale 2 puffs into the lungs in the morning and at bedtime. Wife reports not taking daily - spoke with patient about need for maintenance inhaler   Tamea Dedra CROME, MD  Active   Cholecalciferol (VITAMIN D3) 25 MCG (1000 UT) CAPS [id number] Yes Take by mouth. [provider]  Active   ciclopirox  (LOPROX ) 0.77 % cream [id number] Yes Apply topically both feet, toes and toenails nightly for fungus Jackquline Sawyer, MD  Active   cyanocobalamin  (VITAMIN B12) 1000 MCG tablet [id number] Yes Take 1 tablet (1,000 mcg total) by mouth daily.  Patient taking differently: Take 1,000 mcg by mouth daily. Wife reports told to take every other day   Babara Call, MD  Active Self, Spouse/Significant Other, Pharmacy Records  Magnesium  200 MG TABS [id number] Yes Take 1 tablet by mouth daily in the afternoon. [provider]  Active   mirtazapine (REMERON) 30 MG tablet [id number] Yes Take 30 mg by mouth at bedtime. [provider]  Active   nitroGLYCERIN  (NITROSTAT ) 0.4 MG SL tablet [id number] Yes Place 1 tablet (0.4 mg total) under  the tongue every 5 (five) minutes as needed for chest pain. For up to 3 doses per episode. Maribeth Camellia MATSU, MD  Active Self, Spouse/Significant Other, Pharmacy Records  Probiotic Product (HEALTHY COLON PO) [id number] Yes Take by mouth daily. [provider]  Active   Suvorexant [facility]) 5 MG TABS [id number] Yes Take 1 tablet by mouth daily. [provider]  Active   valsartan  (DIOVAN ) 320 MG tablet [id number] Yes Take 320 mg by mouth daily. [provider]  Active             Recommendation:   PCP Follow-up Continue Current Plan of Care  Follow Up Plan:   Telephone follow-up 6 Emmalene Kattner as preferred  Nestora Duos, MSN, RN [facility]  [facility], [facility] RN Care Manager Direct Dial: [phone] Fax: [phone]

## 2024-07-17 ENCOUNTER — Ambulatory Visit: Payer: Self-pay | Admitting: Medical

## 2024-07-25 ENCOUNTER — Telehealth

## 2024-08-25 ENCOUNTER — Ambulatory Visit: Admitting: Pulmonary Disease

## 2024-11-17 ENCOUNTER — Ambulatory Visit

## 2024-12-04 ENCOUNTER — Ambulatory Visit: Admitting: Dermatology

## 2025-04-03 ENCOUNTER — Ambulatory Visit

## 2025-04-04 ENCOUNTER — Ambulatory Visit

## 2025-05-11 ENCOUNTER — Inpatient Hospital Stay: Admitting: Oncology

## 2025-05-11 ENCOUNTER — Inpatient Hospital Stay
